# Patient Record
Sex: Female | Born: 1940 | Race: White | Hispanic: No | Marital: Married | Smoking: Former smoker
Health system: Southern US, Community
[De-identification: ages and names within clinical notes are randomized; demographics above are authoritative.]

## PROBLEM LIST (undated history)

## (undated) DIAGNOSIS — C7951 Secondary malignant neoplasm of bone: Secondary | ICD-10-CM

## (undated) DIAGNOSIS — K219 Gastro-esophageal reflux disease without esophagitis: Secondary | ICD-10-CM

## (undated) DIAGNOSIS — F419 Anxiety disorder, unspecified: Secondary | ICD-10-CM

## (undated) DIAGNOSIS — E785 Hyperlipidemia, unspecified: Secondary | ICD-10-CM

## (undated) DIAGNOSIS — K5790 Diverticulosis of intestine, part unspecified, without perforation or abscess without bleeding: Secondary | ICD-10-CM

## (undated) DIAGNOSIS — K589 Irritable bowel syndrome without diarrhea: Secondary | ICD-10-CM

## (undated) DIAGNOSIS — M4800 Spinal stenosis, site unspecified: Secondary | ICD-10-CM

## (undated) DIAGNOSIS — I10 Essential (primary) hypertension: Secondary | ICD-10-CM

## (undated) DIAGNOSIS — Z923 Personal history of irradiation: Secondary | ICD-10-CM

## (undated) DIAGNOSIS — N6009 Solitary cyst of unspecified breast: Secondary | ICD-10-CM

## (undated) DIAGNOSIS — A0472 Enterocolitis due to Clostridium difficile, not specified as recurrent: Secondary | ICD-10-CM

## (undated) DIAGNOSIS — D126 Benign neoplasm of colon, unspecified: Secondary | ICD-10-CM

## (undated) DIAGNOSIS — K449 Diaphragmatic hernia without obstruction or gangrene: Secondary | ICD-10-CM

## (undated) DIAGNOSIS — C3490 Malignant neoplasm of unspecified part of unspecified bronchus or lung: Secondary | ICD-10-CM

## (undated) HISTORY — DX: Essential (primary) hypertension: I10

## (undated) HISTORY — DX: Benign neoplasm of colon, unspecified: D12.6

## (undated) HISTORY — DX: Irritable bowel syndrome, unspecified: K58.9

## (undated) HISTORY — DX: Diaphragmatic hernia without obstruction or gangrene: K44.9

## (undated) HISTORY — DX: Enterocolitis due to Clostridium difficile, not specified as recurrent: A04.72

## (undated) HISTORY — DX: Solitary cyst of unspecified breast: N60.09

## (undated) HISTORY — DX: Spinal stenosis, site unspecified: M48.00

## (undated) HISTORY — DX: Gastro-esophageal reflux disease without esophagitis: K21.9

## (undated) HISTORY — PX: VAGINAL HYSTERECTOMY: SUR661

## (undated) HISTORY — PX: BLADDER REPAIR: SHX76

## (undated) HISTORY — PX: KNEE ARTHROSCOPY: SHX127

## (undated) HISTORY — DX: Diverticulosis of intestine, part unspecified, without perforation or abscess without bleeding: K57.90

## (undated) HISTORY — DX: Anxiety disorder, unspecified: F41.9

## (undated) HISTORY — PX: TOTAL KNEE ARTHROPLASTY: SHX125

## (undated) HISTORY — PX: CHOLECYSTECTOMY: SHX55

## (undated) HISTORY — PX: BREAST BIOPSY: SHX20

## (undated) HISTORY — DX: Hyperlipidemia, unspecified: E78.5

---

## 1997-11-21 ENCOUNTER — Ambulatory Visit (HOSPITAL_BASED_OUTPATIENT_CLINIC_OR_DEPARTMENT_OTHER): Admission: RE | Admit: 1997-11-21 | Discharge: 1997-11-21 | Payer: Self-pay | Admitting: General Surgery

## 1997-11-21 ENCOUNTER — Ambulatory Visit (HOSPITAL_COMMUNITY): Admission: RE | Admit: 1997-11-21 | Discharge: 1997-11-21 | Payer: Self-pay | Admitting: General Surgery

## 1998-09-27 ENCOUNTER — Emergency Department (HOSPITAL_COMMUNITY): Admission: EM | Admit: 1998-09-27 | Discharge: 1998-09-27 | Payer: Self-pay | Admitting: Emergency Medicine

## 1998-10-09 ENCOUNTER — Ambulatory Visit (HOSPITAL_COMMUNITY): Admission: RE | Admit: 1998-10-09 | Discharge: 1998-10-09 | Payer: Self-pay | Admitting: Neurosurgery

## 1999-05-20 HISTORY — PX: LUMBAR SPINE SURGERY: SHX701

## 1999-05-27 ENCOUNTER — Encounter: Payer: Self-pay | Admitting: Gastroenterology

## 1999-06-14 ENCOUNTER — Encounter (INDEPENDENT_AMBULATORY_CARE_PROVIDER_SITE_OTHER): Payer: Self-pay | Admitting: *Deleted

## 1999-06-14 ENCOUNTER — Ambulatory Visit (HOSPITAL_COMMUNITY): Admission: RE | Admit: 1999-06-14 | Discharge: 1999-06-15 | Payer: Self-pay | Admitting: General Surgery

## 1999-06-14 ENCOUNTER — Encounter: Payer: Self-pay | Admitting: General Surgery

## 1999-07-15 ENCOUNTER — Inpatient Hospital Stay (HOSPITAL_COMMUNITY): Admission: RE | Admit: 1999-07-15 | Discharge: 1999-07-19 | Payer: Self-pay | Admitting: Neurosurgery

## 1999-08-14 ENCOUNTER — Encounter: Admission: RE | Admit: 1999-08-14 | Discharge: 1999-08-14 | Payer: Self-pay | Admitting: Neurosurgery

## 1999-11-05 ENCOUNTER — Encounter: Admission: RE | Admit: 1999-11-05 | Discharge: 1999-11-05 | Payer: Self-pay | Admitting: Neurosurgery

## 1999-11-25 ENCOUNTER — Encounter: Admission: RE | Admit: 1999-11-25 | Discharge: 1999-11-25 | Payer: Self-pay | Admitting: *Deleted

## 1999-11-25 ENCOUNTER — Encounter: Payer: Self-pay | Admitting: *Deleted

## 2000-02-04 ENCOUNTER — Encounter: Admission: RE | Admit: 2000-02-04 | Discharge: 2000-02-04 | Payer: Self-pay | Admitting: Neurosurgery

## 2000-05-05 ENCOUNTER — Inpatient Hospital Stay (HOSPITAL_COMMUNITY): Admission: RE | Admit: 2000-05-05 | Discharge: 2000-05-08 | Payer: Self-pay | Admitting: Urology

## 2000-06-09 ENCOUNTER — Encounter: Admission: RE | Admit: 2000-06-09 | Discharge: 2000-06-09 | Payer: Self-pay | Admitting: Neurosurgery

## 2000-07-20 ENCOUNTER — Emergency Department (HOSPITAL_COMMUNITY): Admission: EM | Admit: 2000-07-20 | Discharge: 2000-07-20 | Payer: Self-pay | Admitting: Emergency Medicine

## 2000-07-21 ENCOUNTER — Encounter: Payer: Self-pay | Admitting: Emergency Medicine

## 2000-12-15 ENCOUNTER — Encounter: Admission: RE | Admit: 2000-12-15 | Discharge: 2000-12-15 | Payer: Self-pay | Admitting: Neurosurgery

## 2001-02-02 ENCOUNTER — Ambulatory Visit (HOSPITAL_COMMUNITY): Admission: RE | Admit: 2001-02-02 | Discharge: 2001-02-02 | Payer: Self-pay | Admitting: Gastroenterology

## 2001-02-02 ENCOUNTER — Encounter: Payer: Self-pay | Admitting: Gastroenterology

## 2001-02-04 ENCOUNTER — Encounter: Payer: Self-pay | Admitting: Orthopedic Surgery

## 2001-02-08 ENCOUNTER — Inpatient Hospital Stay (HOSPITAL_COMMUNITY): Admission: RE | Admit: 2001-02-08 | Discharge: 2001-02-11 | Payer: Self-pay | Admitting: Orthopedic Surgery

## 2001-02-11 ENCOUNTER — Inpatient Hospital Stay (HOSPITAL_COMMUNITY)
Admission: RE | Admit: 2001-02-11 | Discharge: 2001-02-20 | Payer: Self-pay | Admitting: Physical Medicine & Rehabilitation

## 2001-02-19 ENCOUNTER — Encounter: Payer: Self-pay | Admitting: Physical Medicine & Rehabilitation

## 2001-07-13 ENCOUNTER — Ambulatory Visit (HOSPITAL_COMMUNITY): Admission: RE | Admit: 2001-07-13 | Discharge: 2001-07-13 | Payer: Self-pay | Admitting: Internal Medicine

## 2001-07-13 ENCOUNTER — Encounter: Payer: Self-pay | Admitting: Internal Medicine

## 2001-10-26 ENCOUNTER — Encounter: Payer: Self-pay | Admitting: Gastroenterology

## 2001-11-02 ENCOUNTER — Encounter: Payer: Self-pay | Admitting: Gastroenterology

## 2002-02-22 ENCOUNTER — Ambulatory Visit (HOSPITAL_COMMUNITY): Admission: RE | Admit: 2002-02-22 | Discharge: 2002-02-22 | Payer: Self-pay | Admitting: Neurosurgery

## 2002-03-02 ENCOUNTER — Encounter: Admission: RE | Admit: 2002-03-02 | Discharge: 2002-04-21 | Payer: Self-pay | Admitting: Neurosurgery

## 2002-07-20 ENCOUNTER — Encounter: Admission: RE | Admit: 2002-07-20 | Discharge: 2002-07-20 | Payer: Self-pay | Admitting: Neurosurgery

## 2002-08-01 ENCOUNTER — Encounter: Admission: RE | Admit: 2002-08-01 | Discharge: 2002-08-01 | Payer: Self-pay | Admitting: Neurosurgery

## 2002-12-14 ENCOUNTER — Ambulatory Visit (HOSPITAL_COMMUNITY): Admission: RE | Admit: 2002-12-14 | Discharge: 2002-12-14 | Payer: Self-pay | Admitting: Neurosurgery

## 2003-01-04 ENCOUNTER — Encounter: Admission: RE | Admit: 2003-01-04 | Discharge: 2003-01-04 | Payer: Self-pay | Admitting: Neurosurgery

## 2003-01-16 ENCOUNTER — Encounter: Payer: Self-pay | Admitting: Gastroenterology

## 2003-01-16 ENCOUNTER — Ambulatory Visit (HOSPITAL_COMMUNITY): Admission: RE | Admit: 2003-01-16 | Discharge: 2003-01-16 | Payer: Self-pay | Admitting: Gastroenterology

## 2003-01-31 ENCOUNTER — Encounter: Admission: RE | Admit: 2003-01-31 | Discharge: 2003-01-31 | Payer: Self-pay | Admitting: Neurosurgery

## 2003-05-24 ENCOUNTER — Encounter: Admission: RE | Admit: 2003-05-24 | Discharge: 2003-05-24 | Payer: Self-pay | Admitting: Neurosurgery

## 2003-09-08 ENCOUNTER — Encounter: Admission: RE | Admit: 2003-09-08 | Discharge: 2003-09-08 | Payer: Self-pay | Admitting: Neurosurgery

## 2004-05-19 DIAGNOSIS — A0472 Enterocolitis due to Clostridium difficile, not specified as recurrent: Secondary | ICD-10-CM

## 2004-05-19 HISTORY — DX: Enterocolitis due to Clostridium difficile, not specified as recurrent: A04.72

## 2004-06-13 ENCOUNTER — Ambulatory Visit: Payer: Self-pay | Admitting: Gastroenterology

## 2004-07-12 ENCOUNTER — Ambulatory Visit: Payer: Self-pay | Admitting: Gastroenterology

## 2004-11-11 ENCOUNTER — Inpatient Hospital Stay: Payer: Self-pay | Admitting: Internal Medicine

## 2004-11-11 ENCOUNTER — Other Ambulatory Visit: Payer: Self-pay

## 2005-07-29 ENCOUNTER — Ambulatory Visit: Payer: Self-pay | Admitting: Gastroenterology

## 2005-08-19 ENCOUNTER — Encounter: Payer: Self-pay | Admitting: Gastroenterology

## 2005-08-19 ENCOUNTER — Ambulatory Visit (HOSPITAL_COMMUNITY): Admission: RE | Admit: 2005-08-19 | Discharge: 2005-08-19 | Payer: Self-pay | Admitting: Gastroenterology

## 2005-10-02 ENCOUNTER — Encounter: Payer: Self-pay | Admitting: Gastroenterology

## 2005-11-10 ENCOUNTER — Encounter: Payer: Self-pay | Admitting: Gastroenterology

## 2006-03-25 ENCOUNTER — Ambulatory Visit: Payer: Self-pay

## 2006-03-27 ENCOUNTER — Ambulatory Visit: Payer: Self-pay

## 2006-06-19 ENCOUNTER — Emergency Department (HOSPITAL_COMMUNITY): Admission: EM | Admit: 2006-06-19 | Discharge: 2006-06-19 | Payer: Self-pay | Admitting: Emergency Medicine

## 2007-05-20 HISTORY — PX: PARATHYROIDECTOMY: SHX19

## 2007-08-19 ENCOUNTER — Encounter: Admission: RE | Admit: 2007-08-19 | Discharge: 2007-08-19 | Payer: Self-pay | Admitting: Internal Medicine

## 2008-03-03 ENCOUNTER — Telehealth: Payer: Self-pay | Admitting: Gastroenterology

## 2008-09-03 ENCOUNTER — Inpatient Hospital Stay (HOSPITAL_COMMUNITY): Admission: EM | Admit: 2008-09-03 | Discharge: 2008-09-05 | Payer: Self-pay | Admitting: Emergency Medicine

## 2008-09-05 ENCOUNTER — Encounter (INDEPENDENT_AMBULATORY_CARE_PROVIDER_SITE_OTHER): Payer: Self-pay | Admitting: *Deleted

## 2008-10-10 ENCOUNTER — Ambulatory Visit: Payer: Self-pay | Admitting: Gastroenterology

## 2008-10-10 DIAGNOSIS — R079 Chest pain, unspecified: Secondary | ICD-10-CM

## 2008-10-10 DIAGNOSIS — R1013 Epigastric pain: Secondary | ICD-10-CM | POA: Insufficient documentation

## 2008-10-10 DIAGNOSIS — R740 Nonspecific elevation of levels of transaminase and lactic acid dehydrogenase [LDH]: Secondary | ICD-10-CM

## 2008-10-10 DIAGNOSIS — K219 Gastro-esophageal reflux disease without esophagitis: Secondary | ICD-10-CM | POA: Insufficient documentation

## 2008-10-11 ENCOUNTER — Encounter: Payer: Self-pay | Admitting: Gastroenterology

## 2008-10-11 LAB — CONVERTED CEMR LAB
Albumin: 4.1 g/dL (ref 3.5–5.2)
Alkaline Phosphatase: 126 units/L — ABNORMAL HIGH (ref 39–117)
Total Protein: 7.6 g/dL (ref 6.0–8.3)

## 2008-12-12 ENCOUNTER — Ambulatory Visit: Payer: Self-pay | Admitting: Gastroenterology

## 2009-05-31 ENCOUNTER — Encounter (INDEPENDENT_AMBULATORY_CARE_PROVIDER_SITE_OTHER): Payer: Self-pay | Admitting: *Deleted

## 2009-06-19 ENCOUNTER — Encounter (INDEPENDENT_AMBULATORY_CARE_PROVIDER_SITE_OTHER): Payer: Self-pay

## 2009-06-19 DIAGNOSIS — D126 Benign neoplasm of colon, unspecified: Secondary | ICD-10-CM

## 2009-06-19 HISTORY — DX: Benign neoplasm of colon, unspecified: D12.6

## 2009-06-20 ENCOUNTER — Ambulatory Visit: Payer: Self-pay | Admitting: Gastroenterology

## 2009-07-06 ENCOUNTER — Ambulatory Visit: Payer: Self-pay | Admitting: Gastroenterology

## 2009-07-11 ENCOUNTER — Encounter: Payer: Self-pay | Admitting: Gastroenterology

## 2009-11-22 ENCOUNTER — Encounter: Payer: Self-pay | Admitting: Gastroenterology

## 2009-12-25 ENCOUNTER — Telehealth: Payer: Self-pay | Admitting: Gastroenterology

## 2010-01-10 ENCOUNTER — Ambulatory Visit: Payer: Self-pay | Admitting: Gastroenterology

## 2010-01-10 DIAGNOSIS — K589 Irritable bowel syndrome without diarrhea: Secondary | ICD-10-CM

## 2010-01-10 DIAGNOSIS — Z860100 Personal history of colon polyps, unspecified: Secondary | ICD-10-CM | POA: Insufficient documentation

## 2010-01-31 ENCOUNTER — Inpatient Hospital Stay (HOSPITAL_COMMUNITY)
Admission: RE | Admit: 2010-01-31 | Discharge: 2010-02-03 | Payer: Self-pay | Source: Home / Self Care | Admitting: Orthopedic Surgery

## 2010-03-07 ENCOUNTER — Encounter: Payer: Self-pay | Admitting: Gastroenterology

## 2010-06-18 NOTE — Medication Information (Signed)
Summary: Approved/medco  Approved/medco   Imported By: Lester Oildale 11/27/2009 08:38:24  _____________________________________________________________________  External Attachment:    Type:   Image     Comment:   External Document

## 2010-06-18 NOTE — Letter (Signed)
Summary: [facility] Instructions  [facility]  [address] [place], [place] [address]   Phone: [phone]  Fax: [phone]       SIBBIE FLAMMIA    [birth year]    MRN: [id number]        Procedure Day /Date:  Friday 02/18      Arrival Time:   12:30pm     Procedure Time:  1:30pm     Location of Procedure:                    _ X_  [facility] (4th Floor)                        PREPARATION FOR COLONOSCOPY WITH MOVIPREP   Starting 5 days prior to your procedure   Sunday 02/13  do not eat nuts, seeds, popcorn, corn, beans, peas,  salads, or any raw vegetables.  Do not take any fiber supplements (e.g. Metamucil, Citrucel, and Benefiber).  THE DAY BEFORE YOUR PROCEDURE         DATE:   02/17   DAY: Thursday  1.  Drink clear liquids the entire day-NO SOLID FOOD  2.  Do not drink anything colored red or purple.  Avoid juices with pulp.  No orange juice.  3.  Drink at least 64 oz. (8 glasses) of fluid/clear liquids during the day to prevent dehydration and help the prep work efficiently.  CLEAR LIQUIDS INCLUDE: Water Jello Ice Popsicles Tea (sugar ok, no milk/cream) Powdered fruit flavored drinks Coffee (sugar ok, no milk/cream) Gatorade Juice: apple, white grape, white cranberry  Lemonade Clear bullion, consomm, broth Carbonated beverages (any kind) Strained chicken noodle soup Hard Candy                             4.  In the morning, mix first dose of MoviPrep solution:    Empty 1 Pouch A and 1 Pouch B into the disposable container    Add lukewarm drinking water to the top line of the container. Mix to dissolve    Refrigerate (mixed solution should be used within 24 hrs)  5.  Begin drinking the prep at 5:00 p.m. The MoviPrep container is divided by 4 marks.   Every 15 minutes drink the solution down to the next mark (approximately 8 oz) until the full liter is complete.   6.  Follow completed prep with 16 oz of clear liquid of your choice  (Nothing red or purple).  Continue to drink clear liquids until bedtime.  7.  Before going to bed, mix second dose of MoviPrep solution:    Empty 1 Pouch A and 1 Pouch B into the disposable container    Add lukewarm drinking water to the top line of the container. Mix to dissolve    Refrigerate  THE DAY OF YOUR PROCEDURE      DATE:  02/18  DAY: Friday  Beginning at  8:30 a.m. (5 hours before procedure):         1. Every 15 minutes, drink the solution down to the next mark (approx 8 oz) until the full liter is complete.  2. Follow completed prep with 16 oz. of clear liquid of your choice.    3. You may drink clear liquids until  11:30am  (2 HOURS BEFORE PROCEDURE).   MEDICATION INSTRUCTIONS  Unless otherwise instructed, you should take regular prescription medications with  a small sip of water   as early as possible the morning of your procedure.         OTHER INSTRUCTIONS  You will need a responsible adult at least 69 years of age to accompany you and drive you home.   This person must remain in the waiting room during your procedure.  Wear loose fitting clothing that is easily removed.  Leave jewelry and other valuables at home.  However, you may wish to bring a book to read or  an iPod/MP3 player to listen to music as you wait for your procedure to start.  Remove all body piercing jewelry and leave at home.  Total time from sign-in until discharge is approximately 2-3 hours.  You should go home directly after your procedure and rest.  You can resume normal activities the  day after your procedure.  The day of your procedure you should not:   Drive   Make legal decisions   Operate machinery   Drink alcohol   Return to work  You will receive specific instructions about eating, activities and medications before you leave.    The above instructions have been reviewed and explained to me by  Ulis Rias RN  June 20, 2009 10:15 AM .   I fully  understand and can verbalize these instructions _____________________________ Date _________

## 2010-06-18 NOTE — Miscellaneous (Signed)
Summary: Lec previsit  Clinical Lists Changes  Medications: Added new medication of MOVIPREP 100 GM  SOLR (PEG-KCL-NACL-NASULF-NA ASC-C) As per prep instructions. - Signed Rx of MOVIPREP 100 GM  SOLR (PEG-KCL-NACL-NASULF-NA ASC-C) As per prep instructions.;  #1 x 0;  Signed;  Entered by: Ulis Rias RN;  Authorized by: Meryl Dare MD [facility];  Method used: [facility] [phone]*, [address], [place], [place], [place]  [address], Ph: [phone] or [phone], Fax: [phone]    Prescriptions: MOVIPREP 100 GM  SOLR (PEG-KCL-NACL-NASULF-NA ASC-C) As per prep instructions.  #1 x 0   Entered by:   Ulis Rias RN   Authorized by:   Meryl Dare MD [facility]   Signed by:   Ulis Rias RN on 06/20/2009   Method used:   Electronically to        [facility] [phone]* (retail)       [address] [place], [place]  [address]       Ph: [phone] or [phone]       Fax: [phone]   RxID:   [id number]

## 2010-06-18 NOTE — Assessment & Plan Note (Signed)
Summary: f/u GERD, med refills   History of Present Illness Visit Type: Follow-up Visit Primary GI MD: Elie Goody MD [facility] Primary Provider: Ralene Ok, MD Requesting Provider: n/a Chief Complaint: Patient here for yearly follow up for her gerd and get medication refills of Dexilant and Glycopyrrolate. She does have some changes in her bowels but relates them to her IBS, her BMs change from diarrhea to constipation but she feels this is normal for her. History of Present Illness:   [name] returns with good control of her reflux symptoms and irritable bowel syndrome on her current medications. She states she has occasional variations and her stool pattern between slightly loose to mildly constipated. She underwent colonoscopy earlier this year.   GI Review of Systems      Denies abdominal pain, acid reflux, belching, bloating, chest pain, dysphagia with liquids, dysphagia with solids, heartburn, loss of appetite, nausea, vomiting, vomiting blood, weight loss, and  weight gain.      Reports change in bowel habits, constipation, diarrhea, and  irritable bowel syndrome.     Denies anal fissure, black tarry stools, diverticulosis, fecal incontinence, heme positive stool, hemorrhoids, jaundice, light color stool, liver problems, rectal bleeding, and  rectal pain. Current Medications (verified): 1)  Budeprion Sr 150 Mg Xr12h-Tab (Bupropion Hcl) .... One Tablet By Mouth Once Daily 2)  Evista 60 Mg Tabs (Raloxifene Hcl) .... One Tablet By Mouth Once Daily 3)  Lipitor 80 Mg Tabs (Atorvastatin Calcium) .... One Tablet By Mouth Once Daily 4)  Metoprolol Tartrate 50 Mg Tabs (Metoprolol Tartrate) .... One Tablet By Mouth Once Daily 5)  Tribenzor 40-10-12.5 Mg Tabs (Olmesartan-Amlodipine-Hctz) .... Take One By Mouth Once Daily 6)  Glycopyrrolate 2 Mg Tabs (Glycopyrrolate) .... One Tablet By Mouth Two Times A Day 7)  Dexilant 60 Mg Cpdr (Dexlansoprazole) .... One Tablet By Mouth Once Daily 8)   Aleve 220 Mg Tabs (Naproxen Sodium) .... Talke 4-6 Per Day  Allergies (verified): 1)  ! Keflex 2)  ! Lodine 3)  ! Ace Inhibitors  Past History:  Past Medical History: GERD Irritable Bowel Syndrome Adenomatous colon polyps, 06/2009 Hiatal hernia Benign breast cysts Depression Ureteral lithiasis Diverticulosis Hemorrhoids Hyperlipidemia Hypertension Hyperparathyroidism Anxiety Disorder Migraine headaches Osteoporosis with Vitamin D deficiency  Spinal stenosis C diff colitis 2006  Past Surgical History: Reviewed history from 10/10/2008 and no changes required. Cholecystectomy Hysterectomy Bilateral knee arthroscopies Par[facility]roidectomy at [facility] 2009 Lumbar surgery 2001 Breast biopsy for fibrocystic disease-bilateral Left knee replacement Bladder Tack x 2   Family History: Reviewed history from 10/10/2008 and no changes required. Family History of Colon Polyps: Sister  Family History of Irritable Bowel Syndrome:sister Family History of Breast Cancer: Sister No FH of Colon Cancer: Family History of Diabetes: Sister x 3, Brother  Social History: Reviewed history from 10/10/2008 and no changes required. Alcohol Use - no Patient is a former smoker. -Stopped 2 years ago Daily Caffeine Use-2 cups daily Illicit Drug Use - no Patient gets regular exercise.  Review of Systems       The patient complains of arthritis/joint pain.         The pertinent positives and negatives are noted as above and in the HPI. All other ROS were reviewed and were negative.   Vital Signs:  Patient profile:   70 year old female Height:      65.5 inches Weight:      150.6 pounds BMI:     24.77 Pulse rate:   74 / minute  Pulse rhythm:   regular BP sitting:   100 / 62  (left arm) Cuff size:   regular  Vitals Entered By: Harlow Mares CMA Duncan Dull) (January 10, 2010 10:40 AM)  Physical Exam  General:  Well developed, well nourished, no acute distress. Head:  Normocephalic and  atraumatic. Eyes:  PERRLA, no icterus. Mouth:  No deformity or lesions, dentition normal. Lungs:  Clear throughout to auscultation. Heart:  Regular rate and rhythm; no murmurs, rubs,  or bruits. Abdomen:  Soft, nontender and nondistended. No masses, hepatosplenomegaly or hernias noted. Normal bowel sounds. Psych:  Alert and cooperative. Normal mood and affect.  Impression & Recommendations:  Problem # 1:  GERD (ICD-530.81) Continue Dexilant 60 mg qam, along with standard antireflux measures.  Problem # 2:  IRRITABLE BOWEL SYNDROME (ICD-564.1) Continue glycopyrrolate 2 mg twice daily, along with a high-fiber diet, and adequate fluid intake.  Problem # 3:  PERSONAL HX COLONIC POLYPS (ICD-V12.72) Surveillance colonoscopy recommended February 2016.  Patient Instructions: 1)  Pick up your prescriptions from your pharmacy.  2)  Please schedule a follow-up appointment in 1 year. 3)  Please continue current medications.  4)  Copy sent to : Ralene Ok, MD 5)  The medication list was reviewed and reconciled.  All changed / newly prescribed medications were explained.  A complete medication list was provided to the patient / caregiver.  Prescriptions: GLYCOPYRROLATE 2 MG TABS (GLYCOPYRROLATE) one tablet by mouth two times a day  #60 x 11   Entered by:   Christie Nottingham CMA (AAMA)   Authorized by:   Meryl Dare MD [facility]   Signed by:   Meryl Dare MD [facility] on 01/10/2010   Method used:   [facility]ronically to        [facility] [phone]* (retail)       [address] [place], [place]  [address]       Ph: [phone] or [phone]       Fax: [phone]   RxID:   [id number] DEXILANT 60 MG CPDR (DEXLANSOPRAZOLE) one tablet by mouth once daily  #30 x 11   Entered by:   Christie Nottingham CMA (AAMA)   Authorized by:   Meryl Dare MD [facility]   Signed by:   Meryl Dare MD [facility] on 01/10/2010   Method used:   Electronically to        [facility] [phone]*  (retail)       [address] [place], [place]  [address]       Ph: [phone] or [phone]       Fax: [phone]   RxID:   [id number]

## 2010-06-18 NOTE — Letter (Signed)
Summary: Patient Notice- Polyp Results  [facility]  [address] [place], [place] [address]   Phone: [phone]  Fax: [phone]        July 11, 2009 MRN: [id number]    [facility] [address] [place], [place]  [address]    Dear Ms. Creppel,  I am pleased to inform you that the colon polyp(s) removed during your recent colonoscopy was (were) found to be benign (no cancer detected) upon pathologic examination.  I recommend you have a repeat colonoscopy examination in 5 years to look for recurrent polyps, as having colon polyps increases your risk for having recurrent polyps or even colon cancer in the future.  Should you develop new or worsening symptoms of abdominal pain, bowel habit changes or bleeding from the rectum or bowels, please schedule an evaluation with either your primary care physician or with me.  Continue treatment plan as outlined the day of your exam.  Please call us if you are having persistent problems or have questions about your condition that have not been fully answered at this time.  Sincerely,  Meryl Dare MD [facility]  This letter has been electronically signed by your physician.  Appended Document: Patient Notice- Polyp Results  Letter mailed 2.24.11

## 2010-06-18 NOTE — Letter (Signed)
Summary: Colonoscopy Letter  [facility]  [address] [place], [place] [address]   Phone: [phone]  Fax: [phone]      May 31, 2009 MRN: [id number]   [facility] [address] [place], [place]  [address]   Dear Ms. Mannina,   According to your medical record, it is time for you to schedule a Colonoscopy. The American Cancer Society recommends this procedure as a method to detect early colon cancer. Patients with a family history of colon cancer, or a personal history of colon polyps or inflammatory bowel disease are at increased risk.  This letter has beeen generated based on the recommendations made at the time of your procedure. If you feel that in your particular situation this may no longer apply, please contact our office.  Please call our office at [phone] to schedule this appointment or to update your records at your earliest convenience.  Thank you for cooperating with [place] to provide you with the very best care possible.   Sincerely,  Judie Petit T. Russella Dar, M.D.  [facility]astro[facility]ology [facility] [phone]

## 2010-06-18 NOTE — Letter (Signed)
Summary: Ralene Ok MD  Ralene Ok MD   Imported By: Sherian Rein 03/14/2010 10:49:59  _____________________________________________________________________  External Attachment:    Type:   Image     Comment:   External Document

## 2010-06-18 NOTE — Procedures (Signed)
Summary: Colonoscopy  Patient: [name] Note: All result statuses are Final unless otherwise noted.  Tests: (1) Colonoscopy (COL)   COL Colonoscopy           DONE     [facility]     [address]     [place], [place]  [address]           COLONOSCOPY PROCEDURE REPORT           PATIENT:  [name], [name]  MR#:  [id number]     BIRTHDATE:  1940/07/18, 68 yrs. old  GENDER:  female           ENDOSCOPIST:  Judie Petit T. Russella Dar, MD, [facility]           PROCEDURE DATE:  07/06/2009     PROCEDURE:  Colonoscopy with biopsy     ASA CLASS:  Class II     INDICATIONS:  1) family Hx of polyps           MEDICATIONS:   Fentanyl 75 mcg IV, Versed 9 mg IV           DESCRIPTION OF PROCEDURE:   After the risks benefits and     alternatives of the procedure were thoroughly explained, informed     consent was obtained.  Digital rectal exam was performed and     revealed no abnormalities.   The LB [id number]-[id number] endoscope     was introduced through the anus and advanced to the cecum, which     was identified by both the appendix and ileocecal valve, without     limitations.  The quality of the prep was good, using MoviPrep.     The instrument was then slowly withdrawn as the colon was fully     examined.     <<PROCEDUREIMAGES>>           FINDINGS:  A sessile polyp was found in the distal transverse     colon. It was 3 mm in size. The polyp was removed using cold     biopsy forceps.  Moderate diverticulosis was found in the sigmoid     colon.  This was otherwise a normal examination of the colon.     Retroflexed views in the rectum revealed no abnormalities.   The     time to cecum =  3.75  minutes. The scope was then withdrawn (time     =  12  min) from the patient and the procedure completed.           COMPLICATIONS:  None           ENDOSCOPIC IMPRESSION:     1) 3 mm sessile polyp in the distal transverse colon     2) Moderate diverticulosis in the sigmoid colon            RECOMMENDATIONS:     1) Await pathology results     2) High fiber diet with liberal fluid intake.     3) Repeat Colonoscopy in 5 years.           Venita Lick. Russella Dar, MD, [facility]           CC: Ralene Ok MD           n.     Rosalie DoctorVenita Lick. Khalani Novoa at 07/06/2009 02:15 PM           Elita Quick, [phone]  Note: An exclamation mark (!) indicates a result that was not  dispersed into the flowsheet. Document Creation Date: 07/06/2009 2:16 PM _______________________________________________________________________  (1) Order result status: Final Collection or observation date-time: 07/06/2009 14:11 Requested date-time:  Receipt date-time:  Reported date-time:  Referring Physician:   Ordering Physician: Claudette Head [phone]) Specimen Source:  Source: Launa Grill Order Number: [id number] Lab site:   Appended Document: Colonoscopy recall 5 yrs/2016/aw     Procedures Next Due Date:    Colonoscopy: 06/2014

## 2010-06-18 NOTE — Progress Notes (Signed)
Summary: rx concern  Medications Added DEXILANT 60 MG CPDR (DEXLANSOPRAZOLE) one tablet by mouth once daily       Phone Note Call from Patient Call back at [facility] Phone [phone]   Caller: Patient Call For: Dr. Russella Dar Reason for Call: Talk to Nurse Summary of Call: having trouble refilling Dexilant and doesnt know why... pharmacy would not refill[facility] [place] Initial call taken by: Vallarie Mare,  December 25, 2009 1:37 PM  Follow-up for Phone Call        Pt needs a yearly follow-up visit for GERD and medication refills. Pt scheduled for 01/10/10 at 10:15am. Pt verbalized understanding to keep appt for any further refills. Rx was sent to pts pharmacy.  Follow-up by: Christie Nottingham CMA Duncan Dull),  December 25, 2009 2:11 PM    New/Updated Medications: DEXILANT 60 MG CPDR (DEXLANSOPRAZOLE) one tablet by mouth once daily Prescriptions: DEXILANT 60 MG CPDR (DEXLANSOPRAZOLE) one tablet by mouth once daily  #30 x 0   Entered by:   Christie Nottingham CMA (AAMA)   Authorized by:   Meryl Dare MD [facility]   Signed by:   Christie Nottingham CMA Duncan Dull) on 12/25/2009   Method used:   [facility]ron[facility] to        [facility] [phone]* (retail)       [address] [place], [place]  [address]       Ph: [phone] or [phone]       Fax: [phone]   RxID:   [id number]

## 2010-08-01 LAB — DIFFERENTIAL
Basophils Relative: 0 % (ref 0–1)
Eosinophils Absolute: 0.2 10*3/uL (ref 0.0–0.7)
Eosinophils Relative: 2 % (ref 0–5)
Lymphs Abs: 2.1 10*3/uL (ref 0.7–4.0)
Monocytes Absolute: 0.6 10*3/uL (ref 0.1–1.0)
Monocytes Relative: 6 % (ref 3–12)
Neutrophils Relative %: 68 % (ref 43–77)

## 2010-08-01 LAB — SURGICAL PCR SCREEN
MRSA, PCR: NEGATIVE
Staphylococcus aureus: NEGATIVE

## 2010-08-01 LAB — COMPREHENSIVE METABOLIC PANEL
ALT: 33 U/L (ref 0–35)
AST: 27 U/L (ref 0–37)
Alkaline Phosphatase: 112 U/L (ref 39–117)
GFR calc Af Amer: 49 mL/min — ABNORMAL LOW (ref >60)
Glucose, Bld: 89 mg/dL (ref 70–99)
Potassium: 4.3 mEq/L (ref 3.5–5.1)
Sodium: 137 mEq/L (ref 135–145)
Total Protein: 7.3 g/dL (ref 6.0–8.3)

## 2010-08-01 LAB — CBC
HCT: 31.6 % — ABNORMAL LOW (ref 36.0–46.0)
HCT: 35.8 % — ABNORMAL LOW (ref 36.0–46.0)
Hemoglobin: 12.3 g/dL (ref 12.0–15.0)
MCH: 31 pg (ref 26.0–34.0)
MCH: 31.4 pg (ref 26.0–34.0)
MCHC: 34.8 g/dL (ref 30.0–36.0)
MCHC: 35.1 g/dL (ref 30.0–36.0)
MCV: 88.9 fL (ref 78.0–100.0)
Platelets: 175 10*3/uL (ref 150–400)
RBC: 2.89 MIL/uL — ABNORMAL LOW (ref 3.87–5.11)
RDW: 14.2 % (ref 11.5–15.5)
RDW: 14.6 % (ref 11.5–15.5)
RDW: 14.7 % (ref 11.5–15.5)
RDW: 14.7 % (ref 11.5–15.5)
WBC: 9.1 10*3/uL (ref 4.0–10.5)
WBC: 9 10*3/uL (ref 4.0–10.5)

## 2010-08-01 LAB — BASIC METABOLIC PANEL
BUN: 8 mg/dL (ref 6–23)
BUN: 9 mg/dL (ref 6–23)
Calcium: 8.4 mg/dL (ref 8.4–10.5)
Chloride: 99 mEq/L (ref 96–112)
Creatinine, Ser: 0.86 mg/dL (ref 0.4–1.2)
GFR calc non Af Amer: 55 mL/min — ABNORMAL LOW (ref >60)
GFR calc non Af Amer: >60 mL/min (ref >60)
Glucose, Bld: 109 mg/dL — ABNORMAL HIGH (ref 70–99)
Glucose, Bld: 135 mg/dL — ABNORMAL HIGH (ref 70–99)
Potassium: 4.4 mEq/L (ref 3.5–5.1)

## 2010-08-01 LAB — PROTIME-INR: INR: 1.08 (ref 0.00–1.49)

## 2010-08-01 LAB — URINALYSIS, ROUTINE W REFLEX MICROSCOPIC
Bilirubin Urine: NEGATIVE
Glucose, UA: NEGATIVE mg/dL
Hgb urine dipstick: NEGATIVE
Specific Gravity, Urine: 1.008 (ref 1.005–1.030)
pH: 6 (ref 5.0–8.0)

## 2010-08-01 LAB — CROSSMATCH

## 2010-08-28 LAB — URINALYSIS, ROUTINE W REFLEX MICROSCOPIC
Bilirubin Urine: NEGATIVE
Glucose, UA: NEGATIVE mg/dL
Ketones, ur: NEGATIVE mg/dL
Specific Gravity, Urine: 1.042 — ABNORMAL HIGH (ref 1.005–1.030)
pH: 7.5 (ref 5.0–8.0)

## 2010-08-28 LAB — COMPREHENSIVE METABOLIC PANEL
ALT: 56 U/L — ABNORMAL HIGH (ref 0–35)
AST: 44 U/L — ABNORMAL HIGH (ref 0–37)
Albumin: 3.8 g/dL (ref 3.5–5.2)
Alkaline Phosphatase: 122 U/L — ABNORMAL HIGH (ref 39–117)
BUN: 16 mg/dL (ref 6–23)
CO2: 33 mEq/L — ABNORMAL HIGH (ref 19–32)
Calcium: 8.3 mg/dL — ABNORMAL LOW (ref 8.4–10.5)
Calcium: 8.5 mg/dL (ref 8.4–10.5)
Chloride: 108 mEq/L (ref 96–112)
Creatinine, Ser: 0.73 mg/dL (ref 0.4–1.2)
GFR calc Af Amer: >60 mL/min (ref >60)
GFR calc non Af Amer: >60 mL/min (ref >60)
Glucose, Bld: 97 mg/dL (ref 70–99)
Potassium: 3.4 mEq/L — ABNORMAL LOW (ref 3.5–5.1)
Potassium: 3.5 mEq/L (ref 3.5–5.1)
Sodium: 145 mEq/L (ref 135–145)
Total Bilirubin: 0.4 mg/dL (ref 0.3–1.2)
Total Protein: 6.8 g/dL (ref 6.0–8.3)

## 2010-08-28 LAB — DIFFERENTIAL
Lymphocytes Relative: 12 % (ref 12–46)
Monocytes Absolute: 0.6 10*3/uL (ref 0.1–1.0)
Monocytes Relative: 6 % (ref 3–12)
Neutro Abs: 8.2 10*3/uL — ABNORMAL HIGH (ref 1.7–7.7)

## 2010-08-28 LAB — CBC
HCT: 40.7 % (ref 36.0–46.0)
MCHC: 34.1 g/dL (ref 30.0–36.0)
MCV: 86 fL (ref 78.0–100.0)
Platelets: 271 10*3/uL (ref 150–400)
RDW: 14.5 % (ref 11.5–15.5)

## 2010-08-28 LAB — D-DIMER, QUANTITATIVE: D-Dimer, Quant: 0.49 ug/mL-FEU — ABNORMAL HIGH (ref 0.00–0.48)

## 2010-08-28 LAB — POCT CARDIAC MARKERS
CKMB, poc: 1.2 ng/mL (ref 1.0–8.0)
Myoglobin, poc: 41.8 ng/mL (ref 12–200)

## 2010-08-28 LAB — URINE CULTURE

## 2010-08-28 LAB — TROPONIN I: Troponin I: 0.02 ng/mL (ref 0.00–0.06)

## 2010-08-28 LAB — CK TOTAL AND CKMB (NOT AT ARMC)
CK, MB: 1.5 ng/mL (ref 0.3–4.0)
CK, MB: 1.6 ng/mL (ref 0.3–4.0)
Relative Index: INVALID (ref 0.0–2.5)
Total CK: 86 U/L (ref 7–177)
Total CK: 99 U/L (ref 7–177)

## 2010-10-01 NOTE — Discharge Summary (Signed)
[name], [name]                  ACCOUNT NO.:  [id number]   MEDICAL RECORD NO.:  [id number]          PATIENT TYPE:  INP   LOCATION:  3703                         FACILITY:  [facility]   PHYSICIAN:  Mandy Gardener, MD    DATE OF BIRTH:  1941/01/24   DATE OF ADMISSION:  09/02/2008  DATE OF DISCHARGE:  09/05/2008                               DISCHARGE SUMMARY   DISCHARGE DIAGNOSES:  1. Abdominal pain most likely related to her underlying      gastroesophageal reflux disease versus mild gastritis.  2. Sclerotic lesion in the medial right iliac pon which was benign as      per bone marrow scan results.  3. Hypertension.  4. Hyperlipidemia.  5. Osteoporosis and osteopenia.  6. Depression.  7. History of shingles.  8. History of Clostridium difficile colitis in September 2002.  9. History of colonic diverticulosis.  10.Degenerative joint disease.  11.Irritable bowel syndrome.  12.History of hiatal hernia.   DISCHARGE MEDICATIONS:  1. Azor 10/40 mg p.o. once daily.  2. Evista 60 mg once a day.  3. Metoprolol tartrate 50 mg once a day.  4. Lipitor 80 mg once a day.  5. Prilosec 20 mg twice daily.  6. Budeprion SR 150 mg once a day.   CONSULTATIONS:  None.   PROCEDURES:  None.   DIAGNOSTIC STUDIES:  1. Chest x-ray on September 02, 2008 showed stable exam with no active      disease.  2. CT angiography of the chest on September 02, 2008 showed no evidence of      pulmonary embolism on or any other active disease and it showed      mild pulmonary emphysema.  3. CT scan of the abdomen with contrast on September 02, 2008 showed no      evidence of acute problem.  4. CT scan of the pelvis with contrast on September 02, 2008 showed 2.4 x      2.6 cm medial right iliac sclerotic lesion and there was no      evidence of acute abnormality and it also showed colonic      diverticulosis.  5. Bone scan on September 04, 2008 showed no evidence of metastatic      disease and showed no definite positivity in  the right iliac pon      that suggest iliac pon lesion seen on the CAT scan is benign.      Follow up with the primary doctor which is Dr. Ralene Ok within 1      week.   COURSE OF HOSPITALIZATION:  1. Abdominal pain.  This patient presented with worsening abdominal      pain which is epigastric.  This patient is known for GERD and has      been taking Prilosec with good control of her symptoms.  The      patient had some radiation to her chest and she was admitted to the      hospital for further evaluation.  She was monitored in telemetry      and her tele showed  no evidence of acute problem.  Three sets of      troponin and cardiac enzymes were done and they came to be      negative.  Right in the hospital she was started on Protonix 40 mg      once a day.  She was initially on clear liquid diet and then her      diet was advanced as tolerated.  The patient as a matter of fact      she improved very well.  Her abdominal pain resolved.  Does not      have any nausea or vomiting.  She tolerated the diet very well and      at the time of discharge, she was on regular diet without any      problems.  This patient has been followed by [facility] and I      advised her to see [facility] office within a week for further      evaluation.  2. Sclerotic lesion in the medial right iliac pon.  That was      incidental finding during her CT scan of the pelvis and bone scan      was done for further evaluation.  The bone scan showed no      positivity in the right iliac pon which made this lesion benign      bony lesion.  The patient needs to follow on that with her primary      doctor as an outpatient.  I explained in detail to her and she      voiced understanding.   Otherwise, other medical conditions remained stable during this  hospitalization.  The patient will be discharged home on all  preadmission medications.  She had an appointment with her primary  doctor tomorrow which is September 06, 2008 and she was advised to keep that  appointment.  I also advised her to follow with her gastroenterologist,  Dr. Russella Dar.  I think she is due for a colonoscopy and to be reevaluated  for repeat upper endoscopy if her pain persists.   TOTAL ASSESSMENT TIME:  40 minutes.      Mandy Gardener, MD  Electronically Signed     NAE/MEDQ  D:  09/05/2008  T:  09/05/2008  Job:  [id number]

## 2010-10-01 NOTE — H&P (Signed)
NAMEGRIER, CZERWINSKI NO.:  [id number]   MEDICAL RECORD NO.:  [id number]          PATIENT TYPE:  EMS   LOCATION:  MAJO                         FACILITY:  [facility]   PHYSICIAN:  Elliot Cousin, M.D.    DATE OF BIRTH:  [birth year]   DATE OF ADMISSION:  09/03/2008  DATE OF DISCHARGE:                              HISTORY & PHYSICAL   PRIMARY CARE PHYSICIAN:  Ralene Ok, M.D.   PRIMARY GASTROENTEROLOGIST:  Venita Lick. Russella Dar, MD, Clementeen Graham   CHIEF COMPLAINT:  My stomach is hurting.   HISTORY OF PRESENT ILLNESS:  The patient is a 70 year old woman with a  past medical history significant for diverticulosis, irritable bowel  syndrome, hypertension, and hiatal hernia.  She presents to the  emergency department with a chief complaint of epigastric abdominal  pain.  She points to her epigastrium and states that her stomach started  hurting this morning.  It initially started in the lower abdomen but  radiated to the epigastrium.  The pain was initially crampy in origin.  She rated the pain a 10/10 in intensity at the time, and upon her  presentation to the emergency department tonight, she still rates it a  10/10 in intensity.  She has had intermittent nausea but no vomiting.  She had two bowel movements earlier today which were brown and not  loose.  She denies bright red blood per rectum and black tarry stools.  She has had neither constipation nor diarrhea.  She has had no vomiting.  Two weeks ago, however, she says that she had a stomach virus and  experienced several days of nausea, vomiting and diarrhea.  She has had  no vomiting or diarrhea in approximately 1-1/2 weeks.  The pain in her  epigastrium sometimes radiates to the right chest.  At times, the pain  is and was accompanied with sweating, shortness of breath, and  lightheadedness.  The pain sometimes radiates to the mid back as well.  She denies any recent treatment with antibiotics.  She has not started  any new  medications lately.  She did eat out yesterday at a local  restaurant with her husband.  Her husband, however, has no  gastrointestinal symptoms.   When the patient arrived to the emergency department, she was given two  sublingual nitroglycerin which did not appear to relieve her pain.  She  was also given aspirin by the emergency department physician.  She is  currently afebrile and hemodynamically stable.  Her lab data are  significant for a D-dimer of 0.49, normal lipase of 25, mildly low  potassium of 3.4, mildly elevated SGOT of 57 and a normal white blood  cell count of 10.2.  CT scan of the abdomen and pelvis was ordered by  the emergency department physician and it reveals no acute intrapelvic  or intraabdominal findings, although colonic diverticulosis was noted.  CT angiogram of the chest reveals mild pulmonary emphysema, but no  evidence of acute pulmonary embolism or other active disease.  The  patient is being admitted for further evaluation and management.   Of  note, the patient does acknowledge some discomfort with urination  over the past couple of days.   PAST MEDICAL HISTORY:  1. Hypertension.  2. Hypercholesterolemia.  3. Osteoporosis and osteopenia.  4. Depression.  5. History of shingles.  6. C diff colitis during the September 2002 hospitalization.  7. Colonic diverticulosis.  8. Degenerative joint disease.  9. 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  10.History of kidney stones.  11.Irritable bowel syndrome.  12.Status post hysterectomy in 1977.  13.Status post parathyroid excision in June 2009 at [facility]      for hypercalcemia.  14.Status post cholecystectomy in 2001.  15.Lumbar surgery in 2001.  16.Hiatal hernia.  17.Status post left breast tumor excision, benign, in 2001.  18.Status post bladder tack surgery and vaginal prolapse surgery in      2001.  19.Bilateral knee arthroscopies in 1993 and 1997.   MEDICATIONS:  1.  Azor 10/40 one tablet daily.  2. Evista 60 mg daily.  3. Metoprolol tartrate 50 mg daily.  4. Lipitor 80 mg daily.  5. Prilosec 20 mg daily.  6. Budeprion SR 150 mg daily.   ALLERGIES:  THE PATIENT HAS AN ALLERGY TO KEFLEX WHICH CAUSES A RASH.  SHE ALSO HAS AN ALLERGY TO LODINE WHICH CAUSES A RASH.   SOCIAL HISTORY:  The patient is married.  She lives with her husband in  [place], [place].  She is retired from [employer].  She denies tobacco, alcohol and illicit drug use.   FAMILY HISTORY:  Her mother died of complications from a motor vehicle  accident.  Her father was murdered.   REVIEW OF SYSTEMS:  The patient's review of systems is positive for  discomfort with urination, arthritic pain in her lower back and knees.  Otherwise, review of systems is negative.   PHYSICAL EXAMINATION:  VITAL SIGNS:  Temperature 97.2, blood pressure  132/57, pulse 74, respiratory rate 11 (ranging from 11-20), oxygen  saturation 96% on room air.  GENERAL:  The patient is a pleasant 70 year old alert Caucasian woman  who is currently sitting up in bed in no acute distress, although she  does appear ill.  HEENT:  Head is normocephalic nontraumatic.  Pupils equal, round,  reactive to light.  Extraocular muscles are intact.  Conjunctivae are  clear.  Sclerae are white.  Nasal mucosa is mildly dry.  No sinus  tenderness.  Tympanic membranes not examined.  Oropharynx reveals mildly  dry mucous membranes.  No posterior exudates or erythema.  NECK:  Supple.  No adenopathy, no thyromegaly, no bruit or JVD.  LUNGS:  Clear to auscultation bilaterally.  HEART:  S1-S2 with no murmurs, rubs or gallops.  ABDOMEN:  Obese positive bowel sounds, soft, mildly tender in the  epigastrium without any evidence of hepatosplenomegaly, masses,  distention, or rigidity.  GU/RECTAL:  Deferred.  EXTREMITIES:  Multiple varicosities of the lower extremities.  Pedal  pulses palpable bilaterally.  No  pretibial edema and no pedal edema.  NEUROLOGIC:  The patient is alert and oriented x3.  Cranial nerves II-  XII are intact.  Strength is 5/5 throughout.  Sensation is intact.   ADMISSION LABORATORIES:  1. EKG reveals normal sinus rhythm with a heart rate of 80 beats per      minute and no ST or T-wave abnormalities.  Chest x-ray reveals      stable exam.  No active cardiopulmonary disease.  2. CT scan of the chest.  Results are above.  3. CT scan of the abdomen and pelvis.  The results are above.  In      addition, the CT scan of the pelvis did reveal a 2.4 x 2.6 cm      medial right iliac sclerotic lesion, indeterminate.  CK-MB less      than 1.0, troponin I less than 0.05, myoglobin 48.8, D-dimer 0.49,      lipase 25.  Sodium 141, potassium 3.4, chloride 104, CO2 30,      glucose 113, BUN 16, creatinine 0.73, total bilirubin 1.1, alkaline      phosphatase 134, SGOT 57, SGPT 31, total protein 6.8, albumin 3.8,      calcium 8.5.  WBC 10.2, hemoglobin 13.9, platelets 271.   ASSESSMENT:  1. Epigastric abdominal pain/chest pain.  The patient clearly      complains of epigastric abdominal pain as she pointed to the      epigastrium during the questioning and history and physical.      However, there was apparently some radiation to the chest.  When      she was evaluated by the EMS and the emergency department      physician, she was given aspirin and sublingual nitroglycerin for a      complaint of chest pain.  Her EKG is without any acute      abnormalities.  The CT of the abdomen and pelvis is without any      acute intra-abdominal and/or intrapelvic findings.  Given the      nature of the pain, we will consider gastroesophageal reflux      disease, acute gastritis, acute pancreatitis, although less likely      with a normal lipase, and/or musculoskeletal pain.  Of note, she      did have some crampy lower abdominal pain earlier in the day and      given her positive review of systems  for discomfort with urination,      we will also consider acute pyelonephritis or cystitis.  2. Mildly elevated SGOT of 57.  The significance of this isolated      elevation is unclear at this time.  She is status post      cholecystectomy back in 2001.  3. Mildly elevated D-dimer with a negative CT angiogram for pulmonary      embolism.  4. Hypokalemia.  The patient's serum potassium is borderline low at      3.4.  Magnesium deficiency will need to be ruled out.  5. A 2.4 x 2.6 cm sclerotic lesion within the medial right iliac bone      per CT scan of the pelvis.  This is an incidental finding.  A bone      scan will be ordered for further clarification.   PLAN:  1. The patient is being admitted for further evaluation and      management.  2. We will check an urinalysis culture and sensitivity.  Will also      complete the cardiac workup by ordering cardiac enzymes, fasting      lipid panel, and follow-up EKG.  3. Will check a TSH and free T4 as well.  4. Will order a bone scan to evaluate the iliac bone lesion.  5. Will start a clear liquid diet.  Will replete with normal saline      with potassium chloride added.  The hypokalemia will be treated via      the IV fluids.  6. We will  start IV Protonix and scheduled Mylanta for 48 hours.  7. Will consider gastroenterology consultation with Dr. Russella Dar for an      EGD only if needed.  8. Will check a blood magnesium level to rule out deficiency.      Elliot Cousin, M.D.  Electronically Signed     DF/MEDQ  D:  09/03/2008  T:  09/03/2008  Job:  [id number]   cc:   Ralene Ok, M.D.  Venita Lick. Russella Dar, MD, Clementeen Graham

## 2010-10-04 NOTE — Op Note (Signed)
[place]  Patient:    STEFHANIE KACHMAR                          MRN: [id number] Proc. Date: 06/14/99 Adm. Date:  [id number] Attending:  Henrene Dodge CC:         Anselm Pancoast. Zachery Dakins, M.D.             Venita Lick. Pleas Koch., M.D. [facility]             Patients chart                           Operative Report  PREOPERATIVE DIAGNOSIS:  Chronic cholelithiasis.  POSTOPERATIVE DIAGNOSIS:  Chronic cholelithiasis.  OPERATION:  Laparoscopic cholecystectomy and cholangiogram.  SURGEON:  Anselm Pancoast. Zachery Dakins, M.D.  ASSISTANT:  Adolph Pollack, M.D.  ANESTHESIA:  General anesthesia.  INDICATION:  Denisa Enterline is a 70 year old Caucasian female who was referred to me by Dr. Georg Ruddle. Viviann Spare originally for a breast mass but she recently has had a recurrent episodes of epigastric pain.  She has a problem with her back and is scheduled for disc surgery in approximately a month but her husband had had a previous cholecystectomy performed.  An ultrasound was performed on the patient  with the findings of stone and she was referred to our office.  On physical exam, she is not acutely tender.  Her pain is certainly consistent ith gallstones.  She has mainly epigastric and right upper quadrant but occasionally some pain in the small of the back and I recommended that she proceed with a laparoscopic cholecystectomy and cholangiogram.  The patient was in agreement with this and is a 24-hour evaluation.  DESCRIPTION OF PROCEDURE:  Preoperatively, she was given antibiotics and taken o the operating suite.  After general anesthesia, an oral tube to the stomach, endotracheal tube, then the abdomen was prepped with Betadine surgical scrub and solution and draped in a sterile manner.  The patient has had a previous hysterectomy through a lower Pfannenstiel-type incision.  A small incision was made at the umbilicus.  Sharp dissection down through  the skin, subcutaneous, and fascia picked up and then a small opening made. Entered into the peritoneal cavity.  She does have some adhesions in the lower portion ut we worked above these.  Traction sutures were placed and a Hasson cannula was introduced.  Carbon dioxide was infused.  The gallbladder was slightly dilated, significantly thickened with a lot of adhesions around it, and the upper 10 mm trocar was placed in direct vision and two lateral 5 mm trocars were placed by Dr. Adolph Pollack.  We carefully dissected the adhesions down from the gallbladder sort of with dissecting these away.  The duodenum was kind of adherent up to this but this was carefully dissected down.  Then the cystic artery and cystic duct were identified at the junction with the gallbladder.  The cystic artery was doubly clipped proximally, single distally, and divided.  This then exposed the cystic duct nicely.  It was clipped flush with the gallbladder and then a small opening made.  A cholangiocatheter introduced within this and operative cholangiogram obtained.  There was a very long cystic duct kind of tapering with the common bile duct but then good prompt filling of the common bile duct and flow into the duodenum.  The cystic duct was triply clipped and  then divided.  Then the gallbladder was freed from its bed using the hook electrocautery.  A distal portion of the gallbladder was sort of intrahepatic and this was dissected free and then the bed was coagulated.  Good hemostasis was obtained.  The gallbladder withdrawn through the umbilicus, reinspected, and no evidence of any bleeding.  Then the irrigating fluid removed.  I put a figure-of-eight suture in the umbilicus in addition to the traction suture that originally had been placed which were all tied and then the lateral 5 mm trocars having removed.  Carbon dioxide was released and then the trocar  was withdrawn.  Subcutaneous wounds were closed with 4-0 Vicryl, benzoin, and Steri-Strips on the skin.  The patient tolerated the procedure nicely.  She should be able to release in the morning.DD:  06/14/99 TD:  06/16/99 Job: [id number]

## 2010-10-04 NOTE — Op Note (Signed)
[facility]  Patient:    [name], [name]                     MRN: [id number] Proc. Date: 05/05/00 Adm. Date:  [id number] Attending:  Evlyn Clines CC:         Georg Ruddle. Viviann Spare, M.D.                           Operative Report  PROCEDURE PERFORMED:  Vaginal sacropexy, enterocele repair and anterior urethropexy.  PREOPERATIVE DIAGNOSIS:  Vault prolapse and stress incontinence.  POSTOPERATIVE DIAGNOSIS:  Vault prolapse and stress incontinence.  SURGEON:  Excell Seltzer. Annabell Howells, M.D.  ASSISTANT:  Lucrezia Starch. Ovidio Hanger, M.D.  ANESTHESIA:  General.  DRAINS:  Foley.  COMPLICATIONS:  None.  INDICATIONS:  [name] is a 70 year old white female with vault prolapse and mild stress incontinence.  She is undergo vaginal sacropexy, enterocele repair and anterior urethropexy.  DESCRIPTION OF PROCEDURE:  The patient was taken to the operating room where a general anesthetic was induced.  She was fitted with thigh TED and placed in the low lithotomy position.  Her lower abdomen was shaved.  She was prepped with Betadine solution and draped in the usual sterile fashion.  A Foley catheter was inserted and the bladder was drained.  A Pfannenstiel incision was made with the knife.  This was carried down through the subcutaneous and fascia layers with the Bovie.  The rectus muscle was parted bluntly in the midline.  The peritoneum was opened.  A Balfour retractor was placed.  The bowel was packed cephalad with moist packs and the patient was placed in slight Trendelenburg position.  The sacral promontory was identified and an incision was made vertically over the promontory and with a combination of Bovie and blunt dissection, the periosteum and sacral promontory was identified.  Two 2-0 Prolene stitches were placed in a figure-of-eight fashion in the periosteum and sacral promontory.  These were snapped and placed aside.  A narrow Deaver was then placed vaginally  to retract the apex of the vault inferiorly.  The enterocele was then closed using 3-0 chromic pursestring suture.  Once the enterocele had been closed, the apex of the vaginal vault was identified.  The peritoneum was incised horizontally over the apex of the vagina and reflected back exposing the vaginal wall both anteriorly and posteriorly.  A 2 x 6 inch Prolene mesh was soaked in antibiotic solution.  It was then trimmed to a width of approximately 1-1/4 inches and left full length.  Two 2-0 Prolene sutures were then placed on the posterior wall of the vagina in a figure-of-eight fashion.  These were brought through the mesh and tied.  Two 2-0 Prolene sutures were placed on the anterior vaginal wall in a figure-of-eight fashion and placed through the mesh and tied.  Two additional sutures were placed laterally at the apex of the vaginal vault and tied through the mesh as well.  At this point, the previously placed sacral stitches were placed through the mesh and tied down.  There was minimal tension on the repair.  Once the vaginal sacropexy had been completed, a flap of peritoneum was closed over the mesh with running 3-0 chromic stitch.  I then dissected into the retropubic space.  The bladder neck was identified and dressed with a Babcock figure-of-eight 0 chromic stitch placed in the midline at the bladder neck and then passed through  the pubic periosteum and tied.  An additional stitch was placed at the dome of the bladder and placed to the anterior rectus fascia.  Once these ties were placed and trimmed, the bladder blade and the packs were removed.  The peritoneum was closed with a running 0 chromic stitch.  The muscle was reapproximated in the midline with interrupted 0 chromic stitches.  The rectus fascia was then closed in a single layer using running #1 PDS.  The wound was irrigated.  The skin was closed with clips. The Foley catheter was placed to straight drainage.  Sponge,  instrument and needle counts were correct.  The patient was taken out of the lithotomy position and her anesthetic was reversed.  A dressing had been applied to the wound.  She was admitted to the recovery room in stable condition.  There were no complications. DD:  05/05/00 TD:  05/06/00 Job: [id number]

## 2010-10-04 NOTE — H&P (Signed)
[place]  Patient:    [name], [name] Visit Number: [id number] MRN: [id number]          Service Type: Attending:  Carlisle Beers. Dorothyann Gibbs, M.D. Dictated by:   Jamelle Rushing, P.A. Adm. Date:  02/08/01                           History and Physical  DATE OF BIRTH:  15-Apr-1941  CHIEF COMPLAINT:  Left knee pain.  HISTORY OF PRESENT OF ILLNESS:  The patient is a 70 year old white female with initially having pain and clicking in her knee bilaterally and having an arthroscopic evaluation and debridement with improvement lasting three to four years. Over the last year or so, the patient has noted return of her pain and swelling. Cortisone injections would only give temporary relief. Over the last few months, problems have significantly worsened where she has noted a significant amount of clicking, grinding, and swelling in her knees. She has a significant amount of night pain after being up her feet all day. The pain is described as a constant, deep, aching sensation with a stabbing pain with awkward movements. She denies any radiation. She has difficulty getting in and out of a car and up and down stairs.  ALLERGIES:  KEFLEX and LODINE.  CURRENT MEDICATIONS:  1. Prilosec 20 mg p.o. b.i.d.  2. Robinul Forte 2 mg b.i.d.  3. ______ 0.125 mg under tongue p.r.n. cramps.  4. Premarin 0.625 mg p.o. q.d.  5. Vioxx 25 mg p.o. q.d.  6. Wellbutrin SR 150 mg p.o. b.i.d.  7. Detrol SR 150 mg q.d.  8. Urised tablets one to two tablets p.r.n. bladder irritation.  9. diazepam 5 mg q.8h. p.r.n. muscle spasms. 10. Vicodin ES one tablet every 6 hours p.r.n. pain.  MEDICAL HISTORY:  1. Diverticulosis.  2. Irritable bowel syndrome.  3. Cyst on right kidney.  4. Hiatal hernia.  5. The patient denies diabetes, hypertension, thyroid disease, peptic ulcer     or heart disease, or asthma.  PAST SURGICAL HISTORY:  1. Hysterectomy in 1977.  2.  Cholecystectomy in 2001.  3. Lumbar surgery, 2001.  4. Bilateral knee arthroscopies in 1993 and 1997.  5. Breast surgery with tumor on the left, 2001.  6. Bladder tack and vaginal prolapse in 2001.  The patient denies any complications with any of these procedures.  SOCIAL HISTORY:  The patient is a 70 year old white female who states she smokes about a half of a pack a day for the last 30 years. Denies any alcohol use. She is married. She has two grown children. She lives in a [place] house. She is currently on disability due to her back.  FAMILY PHYSICIAN:  Lilly Cove, M.D., [phone].  FAMILY MEDICAL HISTORY:  Mother is deceased from a motor vehicle accident. Father is deceased from a head injury. Got one brother deceased from pneumonia as a child. Three brothers and three sisters alive with a history of hypertension and diabetes.  REVIEW OF SYSTEMS:  Positive for upper and lower dentures. Glasses at all times. Frequent nausea due to her irritable bowel syndrome and diverticulosis. Positive reflux which is improved with Prilosec, frequent pain with urination, increased urgency and frequency due to her urinary incontinence from multiple bladder surgeries.  PHYSICAL EXAMINATION:  VITAL SIGNS:  Blood pressure is 184/92, pulse of 96, respirations 14, temperature is 98, height is 5 feet 5 inches, weight is  155 pounds.  GENERAL:  This is a healthy appearing, well-developed, adult female. She is able to ambulate without significant limp. She is able to get on and off the exam table without much difficulty.  HEENT:  Head is normocephalic, atraumatic, nontender over maxillary or frontal sinuses. Pupils are equal, round, and reactive. Left eye does deviate laterally. Extraocular movements are intact. Sclerae were nonicteric. Conjunctivae is pink and moist. External ears were without deformities. Canals patent. TMs pearly gray and intact. Gross hearing is intact. Nasal septum  was midline. Mucous membranes were pink and moist. No polyps noted. Oral buccal mucosa was pink and moist. Dentures were in place. Uvula was midline.  NECK:  Supple. The patient had good range of motion of her cervical spine. No palpable lymphadenopathy. The thyroid gland was nontender.  CHEST:  Lung sounds were clear and equal bilaterally. No wheezes, rhonchi, rales, or rubs noted.  ABDOMEN:  Round, soft, nontender. Bowel sounds were present throughout. No palpable hepatosplenomegaly.  EXTREMITIES:  Upper extremities were symmetrically sized and shaped. The patient had good range of motion of her shoulders, elbows, and wrists and 5/5 motor strength in all muscle groups.  Left hip had pain with internal-external rotation, full extension-flexion to 100 degrees. Right hip had no pain with internal-external rotation and was full range of motion.  Right knee was no sign of erythema or ecchymosis. No palpable effusion. She had slight tenderness along the joint line. She had 0 to 120 degrees range of motion. No valgus varus laxity and no anterior or posterior draw. Left knee had a significant medial joint line tenderness. None over the lateral. No effusion. She had 0 to 115 degrees range of motion with no valgus varus laxity and no anterior or posterior draw. Bilateral ankles were symmetrical with good range of motion.  PERIPHERAL VASCULATURE:  Carotid pulses:  No bruits, 2+. Radial pulses 2+, femoral pulses not palpable, posterior tibial pulses 1+. The patient had no lower extremity edema or venous status changes noted.  NEUROLOGICAL:  The patient was conscious, alert, and appropriate. Held an easy conversation with the examiner. Cranial nerves 2 through 12 are grossly intact. Deep tendon reflexes of the upper and lower extremities were symmetrical. The patient was grossly intact to light touch sensation from head to toe.  BREASTS/RECTAL/GENITOURINARY:  Deferred at this  time.  IMPRESSION:  1. End-stage osteoarthritis, left knee.  2. Osteoarthritis right knee.   3. Diverticulosis.  4. Irritable bowel syndrome.  5. Cyst on right kidney.  6. Urinary incontinence.  7. Depression.  8. History of kidney stones.  PLAN:  The patient will be admitted to [facility] on February 08, 2001, under the care of John L. Dorothyann Gibbs, M.D. The patient will undergo routine labs and tests prior to having a left total knee arthroplasty performed. Dictated by:   Jamelle Rushing, P.A. Attending:  Carlisle Beers. Dorothyann Gibbs, M.D. DD:  02/10/01 TD:  02/10/01 Job: [id number]

## 2010-10-04 NOTE — Discharge Summary (Signed)
[place]. [facility]  Patient:    [name], [name]                         MRN: [id number] Adm. Date:  [id number] Disc. Date: [id number] Attending:  Tressie Stalker D                           Discharge Summary  BRIEF HISTORY:  The patient is a 70 year old white female who has had back pain for almost 10 years.  It became intractable March of 2000.  I treated her with medications, steroid injections, physical therapy, etc.  She failed to improve.  She was worked up with a lumbar MRI and a lumbar myelogram CT which demonstrated a mobile spondylolisthesis at L4/5 with degenerative disc disease at that level and L5-S1.  The patient failed medical management; therefore, weighed the risks, benefits, and alternatives to surgery and decided to proceed with a lumbar decompression, stabilization, and fusion.  For past medical history, past surgical history, medication to admission, drug allergies, family medical history, social history, admission history and physical exam, admission plans, assessment, please refer to the typed history and physical.  HOSPITAL COURSE:  I admitted the patient to [facility] on July 15, 1999 with the diagnosis of L4/5 acquired grade spondylolisthesis and L4/5 and L5-S1 degenerative disc disease, spondylosis, and spinal stenosis, lumbago, lumbar radiculopathy.  On the day of admission, I performed a L4/5 and L5-S1 posterior lumbar interbody fusion with insertion of Synthes bone dowels and surgical dynamics titanium pedicle screws from L4 to S1.  The surgery went well without complications.  For full details of this operation, please refer to typed operative note.  The patients postoperative course was essentially unremarkable.  She did have a  fever of 101.0 on July 17, 1999 but no other clinical signs of infection, i.e. her wound looked good and her lungs were clear.  By July 19, 1999, she was afebrile.   Vital signs were stable.  She was eating well, ambulating well, and her wound was healing well without signs of infection. She was requesting discharge home.  I therefore discharged her home on July 19, 1999.  DISCHARGE INSTRUCTIONS:  The patient was given written discharge instructions and instructed to follow up with me in three weeks.  Wear her lumbosacral corset while out of bed and to resume her outpatient medication regimen except Celebrex.  DISCHARGE MEDICATIONS:  As above, resume outpatient medical regimen except Celebrex and she was given a prescription for Percocet 5, #60 with 1-2 p.o. q.4h. p.r.n.  pain with limit eight per day, no refill.  Valium 5 mg #50, 1 p.o. q.8h. p.r.n.  muscle spasm, no refills.  FINAL DIAGNOSES: 1. Lumbar vertebrae-4/5 and lumbar vertebrae-5/sacral vertebrae-1    degenerative disc disease, spondylosis, and spinal stenosis at lumbar    vertebrae-4/5. 2. Grade 1 spondylolisthesis.  PROCEDURES:  L4/5 and L5-S1 posterior lumbar interbody fusion with insertion of  bilateral Synthes bone dowels and posterior segmental instrumentation from L4 to S1 using pedicle screws and rods with a posterolateral fusion. DD:  07/19/99 TD:  07/20/99 Job: [id number]

## 2010-10-04 NOTE — Discharge Summary (Signed)
. [facility]  Patient:    [name]. Visit Number: [id number] MRN: [id number]          Service Type: Attending:  Carlisle Beers. Dorothyann Gibbs, M.D. Dictated by:   Jamelle Rushing, P.A. Adm. Date:  02/08/01 Disc. Date: 02/11/01   CC:         Lilly Cove, M.D.   Discharge Summary  ADMISSION DIAGNOSES:  1. End-stage osteoarthritis, left knee.  2. Diverticulosis.  3. Irritable bowel syndrome.  4. Urinary incontinence.  5. Depression.  6. History of kidney stones.  DISCHARGE DIAGNOSES:  1. Left total knee arthroplasty.  2. Postoperative blood loss anemia.  3. Diverticulosis.  4. Irritable bowel syndrome.  5. Urinary incontinence.  6. Depression.  7. History of kidney stones.  HISTORY OF PRESENT ILLNESS:  The patient is a 70 year old white female with a history of bilateral arthroscopic procedures for swelling and pain in 1993 ad 1997.  The patient had improvement for several years after these procedures, but then developed return of swelling and pain.  The patient had cortisone injections several times with some short term improvement, but then over the last several months or so, she has had significantly worsening of her symptoms.  The pain currently is constant, achy sensation with stabbing pains with weightbearing and awkward movements.  She does have clicking, grinding and swelling and night pain.  She has difficulty with stairs and in and out of the car.  She is not using an assistive device.  DRUG ALLERGIES:  KEFLEX, LODINE.  CURRENT MEDICATIONS:  1. Prilosec 20 mg p.o. b.i.d.  2. Robinul Forte 2 mg b.i.d.  3. _____ 0.125 mg 2 tablets p.o. p.r.n.  4. Premarin 0.625 mg q.d.  5. Vioxx 25 mg q.d.  6. Wellbutrin SR 150 mg p.o. b.i.d.  7. Detrol SR 150 mg p.o. q.d.  8. Urised 1-2 tablets p.o. p.r.n.  9. Diazepam 5 mg q.8h. p.r.n. 10. Vicodin ES q.6h. p.r.n.  SURGICAL PROCEDURE:  On February 08, 2001, the patient was taken to the OR  by Dr. Jonny Ruiz L. Rendall, assisted by Dr. Vear Clock.  Under general anesthesia, the patient underwent a left total knee arthroplasty due to bone-on-bone medial compartment.  The patient tolerated this procedure well.  Operative time was approximately one hour.  No drains were left in place.  The patient was transferred to the recovery room in good condition.  The following routine consults were requested:  Physical therapy, occupational therapy, care management.  HOSPITAL COURSE:  On February 08, 2001, the patient was admitted to [facility] under the care of Dr. Jonny Ruiz L. Rendall, he was taken to the OR for a left total knee arthroplasty.  The patient tolerated this procedure well and was transferred to the recovery room and then to the orthopedic floor in good condition.  The patient was placed on Arixtra 2.5 mg q.d. for a total of seven days for routine DVT prophylaxis.  The patient then incurred a total of three days postoperative course in which she did develop some postoperative blood loss anemia, but was asymptomatic.  The patient initially did develop a slight perineal palsy, but this appeared to be resolved on its own and the patients lower extremity remained neuromotor vascularly intact.  Her vital signs remained stable, she was afebrile and she progressed well with the use of the CPM.  Her wound remained benign for any signs of infection.  She worked well with physical therapy, but it was  felt on postoperative day #3, that a short stay at the rehabilitation center would be the best option prior to being discharged to home.  A vet became available and she was transferred to rehab.  LABORATORY DATA:  A venous Doppler done on September 27 shows no evidence of DVTs, superficial thrombus or Bakers cyst bilaterally.  Chest x-ray on July 11, 1999, shows probable chronic obstructive pulmonary disease with no active lung disease.  On September 25, CBC, WBCs 10.4, down  from 11.1 day previous.  Hemoglobin at 9.8, hematocrit at 28.1, platelets 230.  Sodium of 137, potassium of 3.5, glucose 136, BUN 5, creatinine 0.6.  Urinalysis on admission was negative.  DISCHARGE MEDICATIONS:  1. Senokot 8.6 mg p.o. b.i.d.  2. Robinul 2 mg p.o. b.i.d.  3. Protonix 40 mg p.o. b.i.d.  4. Arixtra 2.5 mg subcutaneous q.24h.  5. Detrol 4 mg p.o. q.d.  6. OxyContin SR 10 mg p.o. q.12h.  7. Laxative or enema of choice.  8. Tylenol 650 mg p.o. q.4-6h. p.r.n.  9. Valium 5 mg p.o. q.8h. p.r.n. 10. Percocet 1-2 tablets every 4-6 hours p.r.n. pain.  DISCHARGE INSTRUCTIONS:  To rehabilitation.  MEDICATIONS:  Patient is to continue meds on discharge from ortho floor.  ACTIVITY:  Patient may be weightbearing as tolerated with the use of a walker and close supervision.  DIET:  No restrictions.  WOUND CARE:  The patient is to have wound checked daily for infections. Staples removed on postoperative day #10-14.  Arixtra is to be given for a total of seven days.  FOLLOWUP:  The patient is to have a follow-up appointment with Dr. Priscille Kluver two two weeks from date of discharge from rehabilitation services. Dictated by:   Jamelle Rushing, P.A. Attending:  Carlisle Beers. Dorothyann Gibbs, M.D. DD:  02/27/01 TD:  02/28/01 Job: [id number]

## 2010-10-04 NOTE — H&P (Signed)
[place]. [facility]  Patient:    [name], [name]                         MRN: [id number] Adm. Date:  [id number] Attending:  Tressie Stalker D                         History and Physical  CHIEF COMPLAINT:  Back pain and left hip pain.  HISTORY OF PRESENT ILLNESS:  The patient is a 70 year old white female who complains of approximately a nine-year history of back pain.  She has been seen by multiple physicians in the past, and has been treated with medications, physical therapy, steroid injections, etc.  The pain has worsened in March 2000, and she has been seen in the emergency room since then.  She was eventually referred to me.  The patient complains of predominantly left-sided low back pain which radiates nto her left buttock and hip region.  Occasionally radiates further down her leg. t worsens if she stands or walks and improves if she lies down.  She has some numbness and paresthesia in her left leg.  She has not noted any weakness or ataxia or incontinence.  I treated the patient with medications, physical therapy, etc.  She did not improve and I performed a lumbar myelogram CT on her, and it demonstrated a mobile spondylolisthesis at L4-5 with degenerative disk disease at L5-S1.  PAST MEDICAL HISTORY: 1. Positive for depression. 2. Knee osteoarthritis. 3. Nephrolithiasis. 4. Benign breast lesions.  CURRENT MEDICATIONS PRIOR TO ADMISSION: 1. Celebrex 200 mg q.d. 2. Premarin 0.625 mg p.o. q.d. 3. Zoloft 50 mg q.d. 4. Oxycodone p.r.n. 5. Hydrocodone p.r.n.  ALLERGIES:  LODINE causes welts and KEFLEX causes a rash.  PAST SURGICAL HISTORY: 1. Hysterectomy in 1977. 2. Knee surgery bilateral in 1992, and 1997. 3. Breast biopsies in 1970, 1976, 1987, and 1999. 4. Nephrolithiasis in 1993, and 1997.  FAMILY HISTORY:  The patients mother died at age 17 from a motor vehicle accident. The father was murdered at age 17.  She has three  sisters who have high blood pressure and diabetes mellitus.  She has a brother with hypertension.  SOCIAL HISTORY:  The patient is married.  She has two children.  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  She has been out of ork for quite a long time, secondary to her back troubles.  She smokes 1/2 pack of cigarettes times approximately 40 years.  She denies ethanol or drug use.  REVIEW OF SYSTEMS:  Negative except as above.  PHYSICAL EXAMINATION:  GENERAL:  A pleasant well-developed, well-nourished 70 year old white female, in no apparent distress.  Height 5 feet 5 inches, weight 152 pounds.  HEENT:  Normocephalic, atraumatic.  Pupils equal, round, reactive to light. Extraocular muscles intact.  Occasional strabismus.  Sclerae white. Conjunctivae pink.  Oropharynx benign.  Uvula midline.  NECK:  Supple, no masses, meningismus, deformities, tracheal deviation, jugular  venous distention, or carotid bruits.  She has tenderness on deep palpation of he vertebrae prominence.  Spurlings test is negative bilaterally.  Lhermittes sign  was not present.  THORAX:  Symmetric.  LUNGS:  Clear to auscultation without rales, rhonchi, or wheezes.  HEART:  A regular rate and rhythm.  ABDOMEN:  Soft, nontender.  No guarding or rebound.  EXTREMITIES:  No obvious deformities.  BACK:  Demonstrates some small subcutaneous mass in  her left sacral iliac regions. It feels like a lipoma.  There is no point tenderness or deformities.  Straight leg raise testing is positive on the left and negative on the right.  Fabere testing is equivalent on the left, negative on the right.  NEUROLOGIC:  The patient is alert and oriented x 3.  Cranial nerves II-XII are grossly intact bilaterally, except she does have a left external strabismus and  some decreased vision in her left eye.  Motor strength is 5/5 in her bilateral deltoid, biceps, triceps, hand grip, wrist  extensor, interosseous, psoas, quadriceps, gastrocnemius, extensor hallucis long.  Cerebellar examination is intact to rapid alternating movements of the upper extremities bilaterally. Deep tendon reflexes are 1/4 in her bilateral brachial radialis, 1-2/4 in her bilateral biceps, triceps, quadriceps, and gastrocnemius.  She has bilateral flexor plantar reflexes.  No ankle clonus.  Sensory examination is grossly normal to light touch and pinprick sensation in all tested dermatomes bilaterally.  DIAGNOSTIC STUDIES:  The patient had a lumbar MRI performed at [facility] on March 21, 1998, demonstrating grade 1 spondylolisthesis at L4-5.  She has significant degenerative disk disease at L5-S1, with modic-type changes.  She also had a lumbar myelogram, CT performed at [facility] which demonstrates a mobile spondylolisthesis with degenerative disk disease at L4-5 and L5-S1.  ASSESSMENT/PLAN: 1. L4-5 spondylolisthesis. 2. L4-5, L5-S1 degenerative disk disease. 3. Spondylosis. 4. Lumbago. 5. Lumbar radiculopathy.  PLAN:  I have discussed the situation with the patient and with her daughter.  discussed the various treatment options, including doing nothing, continuing with medical management, steroid injections, physical therapy, and surgery.  I have discussed her surgical options, including an L4-5 and L5-S1 laminectomy, posterior interbody fusion, and pedicle screw stabilization, fusion with bone dowls.  I have shown her surgical models and discussed the risks of surgery extensively.  The patient has thus far failed medical management, i.e. nonsurgical management, and therefore weighed the risks, benefits, and alternatives to surgery.  She has decided to proceed with the operation.  I did advise her to quit smoking prior o the fusion, as it would adversely effect fusion rates.  6. History of depression. 7. Knee osteoarthritis. 8.  Nephrolithiasis. DD:  07/15/99 TD:  07/15/99 Job: [id number]

## 2010-10-04 NOTE — Discharge Summary (Signed)
[facility]  Patient:    [name], [name]                     MRN: [id number] Adm. Date:  [id number] Disc. Date: [id number] Attending:  Evlyn Clines CC:         Lilly Cove, M.D.   Discharge Summary  HISTORY:  Ms. Estock is a 70 year old white female with a history of vaginal prolapse and mild incontinence.  She has elected to undergo vaginopexy and anterior urethropexy.  PAST MEDICAL HISTORY:  ALLERGIES:  KEFLEX, LODINE.  PREOPERATIVE MEDICATIONS: 1. Prilosec 20 mg daily. 2. ______  0.375 mg daily. 3. Premarin 0.625 daily. 4. Diazepam 5 mg p.r.n. 5. Wellbutrin 150 mg twice daily. 6. Hydrocodone p.r.n. 7. Vioxx 25 mg daily.  MEDICAL HISTORY:  Irritable bowel and kidney stones.  SURGICAL HISTORY:  Hysterectomy in 1977, bilateral breast biopsy, surgery on both knees, cholecystectomy, lumbar fusion, and two kidney stone procedures including lithotripsy.  For the details of the history and physical, please see the history assessment sheet and my office notes in the chart.  ACCESSORY CLINICAL INFORMATION:  Preoperative hemoglobin 14.6.  Chest x-ray with probable COPD.  EKG with normal sinus rhythm, left atrial enlargement, septal infarct, age undetermined.  HOSPITAL COURSE:  On the day of admission, the patient was taken to the operating room after receiving PO Tequin.  She underwent vaginopexy, enterocele repair, and anterior urethropexy through a Pfannenstiel incision. She tolerated the procedure well and was left with a Foley catheter. Postoperatively she did well.  Her Foley catheter was removed and her PAS hose and her oxygen.  She was having positive bowel sounds but was slightly distended.  She was given a Dulcolax suppository.  Her IV was decreased on the first postoperative day.  On the second postoperative day, she was having some abdominal pain following a coughing episode but was tolerating a diet and had a bowel movement.   She was voiding without complaints.  She was afebrile.  Her wound was intact.  Her abdomen was soft.  Her PCA was discontinued and IV was Heplock.  On the third postoperative day, she continued to improve.  She was tolerating a diet and had minimal pain.  She was afebrile.  Her wound was intact.  She was felt to be ready for discharge home with the final diagnosis of prolapse with enterocele and stress incontinence.  There were no complications during her admission.  DISCHARGE MEDICATIONS:  Vicodin and Levaquin.  She was instructed to follow up with me in a week for staple removal.  DISPOSITION:  To home.  CONDITION ON DISCHARGE:  Improved. DD:  05/28/00 TD:  05/28/00 Job: [id number]

## 2010-10-04 NOTE — Discharge Summary (Signed)
[place]. [facility]  Patient:    [name], [name] Visit Number: [id number] MRN: [id number]          Service Type: Attending:  Rande Brunt. Thomasena Edis, M.D. Dictated by:   Dian Situ, PA Adm. Date:  02/11/01 Disc. Date: 02/20/01   CC:         Lilly Cove, M.D.  John L. Dorothyann Gibbs, M.D.  Venita Lick. Pleas Koch., M.D. [facility]   Discharge Summary  DISCHARGE DIAGNOSES: 1. Status post left total knee replacement. 2. Citrobacter urinary tract infection, treated. 3. Clostridium difficile colitis. 4. Hypokalemia, resolved. 5. Postoperative anemia.  HISTORY OF PRESENT ILLNESS:  [name] is a 70 year old female with history of IBS, GERD, DVT, DJD, status post bilateral knee arthroscopy.  She continued with increased pain in left knee and elected to undergo left total knee replacement on February 08, 2001 by Dr. Priscille Kluver.  Postoperative is weight bearing as tolerated and on Arixtra for DVT prophylaxis.  Pain control has been reasonable.  There has been some question of peroneal nerve palsy on left.  She is currently guard assist for transfers, guard assist for ambulating 20 to 80 feet with standard walker and knee flexion at 93 degrees.  PAST MEDICAL HISTORY: Significant for IBS, depression, diverticulosis, renal calculi, hyperactive bladder, back surgery, GERD, cholecystectomy, hysterectomy.  ALLERGIES:  KEFLEX and LODINE.  SOCIAL HISTORY:  The patient is married.  Lives in one level home with no steps at entry.  She was independent prior to admission.  Does not use any alcohol.  Smokes 1/2 pack per day.  HOSPITAL COURSE:  [name] was admitted to rehab on February 11, 2001 for inpatient therapy to consist of PT and OT daily.  Past admission she was maintained on subcutaneous Arixtra for DVT prophylaxis.  She had increased complaints of reflux and Prilosec was increased to b.i.d.  Supplement was added for her postoperative anemia.  Labs  past admission showed the patient to be mildly hyperkalemic with potassium of 3.2 and this was supplemented.  A urinalysis was done and showed white count too numerous to count and many bacteria.  She was started on empiric Amoxicillin until cultures finalized.  Urine culture grew out Citrobacter koseri which was resistant to Ampicillin and she was started on Cipro and was treated with seven day course of antibiotics for this.  She has had problems with constipation and therefore her Robinul was held and Citrucel was resumed.  Follow up labs of February 15, 2001 shows postoperative anemia to be improving with hemoglobin 10.7, hematocrit 32.1, white count 423.  Check of electrolytes of February 15, 2001 showed sodium 139, potassium 4.2, chloride 104, CO2 26, BUN 9, creatinine 0.7, glucose 108.  On February 15, 2001 the patient with some GI symptoms with nausea, vomiting as well as diarrhea with question of gastroenteritis.  As symptoms continued on February 19, 2001 labs were checked showing leukocytosis with white count of 22.3 with toxic granulation.  Check of lytes showed mild hyponatremia with sodium at 134.  As the patient was noted to have diarrhea, urine culture checked for C. difficile was sent off.  Flat and upright abdominal films were done to rule out ileus.  This showed no acute abdominal process. No distension.  Her C. difficile cultures turned out positive and she was started on Flagyl for this. Repeat CBC obtained February 20, 2001 shows some improvement in leukocytosis with white count at 12.8.  The patient was discontinued on Flagyl 500 mg  per day to continue for 10 total days.  At the time of discharge she was modified independent for transfers, modified independent for ambulating greater than 150 feet, knee flexion was at 95 degrees.  She was modified independent for ADL needs.  Knee incision was noted to be healing well without any signs or symptoms of infection.  Staples  were discontinued and area Steri-Strips on postoperative day 12 without difficultly.  Further follow up therapies to include home health PT per [facility].  She is to continue on Ecotrin 325 mg a day for three additional weeks.  On February 20, 2001 the patient was discharged to home.  DISCHARGE MEDICATIONS:  1. Prilosec 20 mg b.i.d.  2. Robinul 42 mg b.i.d.  3. Premarin 0.625 mg q.d.  4. Vioxx 25 mg q.d.  5. Detrol SR b.i.d.  6. OxyContin CR b.i.d.  7. Flagyl 500 mg t.i.d. x10 days.  8. OxyCodone 5 to 10 mg p.o. q.4-6h. p.r.n. pain.  9. Wellbutrin 150 mg b.i.d. 10. Trinsicon one p.o. b.i.d.  ACTIVITY:  To use walker.  WOUND CARE:  Keep clean and dry.  SPECIAL INSTRUCTIONS:  The patient to follow up with Dr. Priscille Kluver in one to two weeks. Follow up with Dr. Ellwood Dense as needed. Dictated by:   Dian Situ, PA Attending:  Rande Brunt. Thomasena Edis, M.D. DD:  03/04/01 TD:  03/05/01 Job: [id number]

## 2010-10-04 NOTE — Op Note (Signed)
[place]. [facility]  Patient:    [name], [name] Visit Number: [id number] MRN: [id number]          Service Type: SUR Location: RCRM 2550 07 Attending Physician:  Carolan Shiver Ii Dictated by:   Carlisle Beers. Dorothyann Gibbs, M.D. Proc. Date: 02/08/01 Admit Date:  02/08/2001                             Operative Report  PREOPERATIVE DIAGNOSIS:  Osteoarthritis of the left knee.  SURGICAL PROCEDURE:  Left LCS total knee replacement, fully cemented.  POSTOPERATIVE DIAGNOSIS:  Osteoarthritis of the left knee.  SURGEON:  John L. Dorothyann Gibbs, M.D.  ASSISTANTJerolyn Shin. Tresa Res, M.D.  ANESTHESIA:  General.  PATHOLOGY:  Bone-on-bone medial compartment left knee.  DESCRIPTION OF PROCEDURE:  Under general anesthesia, the left leg is prepped with Duraprep and draped as a sterile field.  A proximal thigh tourniquet is used, the leg is wrapped out with the Esmarch, and the tourniquet is used at 350 mmHg.  A slightly medial midline incision is made.  Dissection is carried down through the joint capsule.  Medial capsullary tissue is peeled from the medial tibial metaphysis, and the patella is everted.  Multiple small vessels are cauterized.  The femur is sized at a standard, proximal tibial resection is carried out with the first tibial guide.  Using the first femoral guide, an intercondylar drill hole is placed.  Using the second guide, anterior and posterior flare of the distal femur are resected, but first it is necessary to balance the flexion gap, and this done by stripping the medial tibia around to the posterior medial using a Key elevator.  Once this is completed, the flexion gap is balanced at 15 mm, and the anterior and posterior flare of the distal femur are resected.  Intramedullary guide is then used, and the distal femoral cut is made with a 15 mm extension gap.  Recessing guide is then used. Lamina spreader is then inserted, and remnants of the menisci  and the cruiciates are resected.  Once this is completed, McKale and Homan are inserted.  The proximal tibia is sized to a 3.  Center peg hole is placed. Trial reduction of a size 3 tibia, 15 mm bearing, and standard femur reveals excellent fit of laminate and stability.  The patella is osteotomized, and three peg holes are placed for the standard patella.  Bony surfaces are then prepared with irrigation, and cement is mixed.  Femoral components are cemented in place.  Once this is hardened, excess is removed, tourniquet is let down at 45 minutes, multiple small vessels were cauterized, and the knee is then closed in layers with #1 surgidac, 0 and 2-0 Vicryl, and skin clips.  TOTAL OPERATIVE TIME:  Approximately one hour.  The patient tolerated the procedure well, and returned to recovery in good condition. Dictated by:   Carlisle Beers. Dorothyann Gibbs, M.D. Attending Physician:  Carolan Shiver Ii DD:  02/08/01 TD:  02/08/01 Job: [id number]

## 2010-10-04 NOTE — Op Note (Signed)
[place]. [facility]  Patient:    [name], [name]                         MRN: [id number] Proc. Date: 07/15/99 Adm. Date:  [id number] Attending:  Tressie Stalker D                           Operative Report  BRIEF HISTORY:  The patient is a 70 year old white female who has had back pain for almost 10 years.  It became intractable in March of 2000.  I treated her with medications, steroids, and physical therapy.  She has been treated by several different physicians.  She failed to improve.  She was worked up with a lumbar RI and lumbar model CT which demonstrated a mobile spondylolisthesis at L4-5 with 4-5 and L5-S1 degenerative disease spondylosis and spinal stenosis.  The patient therefore weighing the risks, benefits, alternatives to surgery decided to proceed with a lumbar decompression, stabilization, and fusion after she failed medical  management.  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  L4-5 and L5-S1 degenerative disk disease, spondylosis, spinal stenosis, lumbega, and lumbar radiculopathy.  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  L4-5 and L5-S1 degenerative disk disease, spondylosis, spinal stenosis, lumbega, and lumbar radiculopathy.  PROCEDURE:  L4-5 and L5-S1 posterior lumbar interbody fusion with insertion of bilateral 11 mm Synthes bone dials at L5-S1 and 13 mm bilateral Synthes bone dials at L4-5, posterior nonsegmental instrumentation with pedicle screws and rods from L4 to S1 bilaterally and a posterior lateral fusion with local morsilized autograft bone and allograft bone bilaterally.  SURGEON:  Cristi Loron, M.D.  ASSISTANT:  Alanson Aly. Roxan Hockey, M.D.  ANESTHESIA:  General endotracheal anesthesia.  ESTIMATED BLOOD LOSS:  700 cc.  SPECIMENS:  None.  DRAINS:  None.  COMPLICATIONS:  None.  DESCRIPTION OF PROCEDURE:  The patient was brought to the operating  room by the  anesthesia team.  General endotracheal anesthesia was induced.  The patient was  then turned to the prone position on the chest rolls.  Her lumbosacral region was then prepared with Betadine scrube and Betadine solution and sterile drapes were applied.  I then injected the area to be incised with Marcaine with epinephrine  solution and used the scalpel to make a vertical incision over the L4-5 and L5-S1 interspace.  I used electrocautery to dissect down to the thoracolumbar fascia. I divided the fascia bilaterally and performed a bilateral subperiosteal dissection stripping the paraspinous musculature from the spinous process of the lamina of L4, L5, and upper sacrum.  I inserted the McCullough retractor for exposure and obtained an introaperative radiograph to confirm my location.  I then used the Midas-Rex highspeed drill to perform the laminotomies and foraminotomies.  I began on the left side and I drilled off the caudal edge of the L5 lamina until I encountered insertion of ligamentum flavum.  I then drilled off the caudal edge of the left L5 lamina until I encountered insertion of the ligmanetum flavum.  I widened the laminotomy with the Kerrison punch removing the L4-5 and L5-S1 ligamentum flavum, decompressing the thecal sac, identified the left L5-S1 nerve root and performed a generous foraminotomy about it.  I saved some of the bone during the decompression for later morsilization and then used in the fusion.  then used the Kerrison punches to remove more of the medial aspect  of the facet  joint at L4-5 and L5-S1.  I identified the exiting L4 and L5 nerve roots in the  neuroforamen.  I then carefully retracted the L5 nerve root medially and thecal sac medially at L4-5 to gain access to the intervertebral disk.  I incised the disk  with a 15 blade scalpel, performed an aggressive diskectomy using the pituitary  forceps, scalpel, and Epstein curets.  I  then repeated this procedure at L5-S1. I retracted the thecal sac and the S1 nerve root medially, incised the L5-S1 intervertebral disk, and performed aggressive diskectomy with the pituitary forceps and Epstein curets.  I repeated this procedure identically.  I then repeated the laminotomy, foraminotomy, diskectomy on the right side as described for the left side, ie, at L4-5 and L5-S1.  At this point, good decompression of the thecal sac at bilateral L4, 5, and S1 nerve roots.  I then turned my attention to the arthrodesis.  I inserted the vertebral body spreader to the right L5-S1 interspace and extracted L5 and S1. I then prepared the vertebral interbodies on the left side with the disk space curets.  I then inserted an 11 mm Synthes bone dowel into the interspace under fluoroscopic guidance.  I then removed the vertebral body spreader and then from the right side and then filled the center of the diskectomy site with cancellous allograft bone and osteoblast matrix.  I then carefully inserted an 11 mm bone dowel into the right L5-S1 interspace under fluoroscopic guidance.  I then inspected the thecal sac and a bilateral L5 and S1 nerve root for any damage. There was none.  I then repeated this procedure at L4-5 as described above except that at this level, I inserted a 13 mm Synthes bone bowel bilaterally and once again I inspected the thecal sac and the bilateral L4 and L5 nerve roots for any damage and there was none.  At this point I completed the posterior lumbar interbody fusion and now I turned my attention to the posterior spinal instrumentation.  I exposed the bilateral L4, L5, transverse processes over the electrocautery and then under fluoroscopic guidance, I decordicated posterior to the bilateral L4, 5, and S1 pedicles with the Midas-Rex highspeed drill.  I then cannulated the pedicles with the pedicle probe.  I then tapped the pedicles and then removed the  tap and felt inside the tapped area to  make sure that the tapped area was well within the cortical bone and they all were. I did the procedure identical for each screw.  I inserted a 5.75 x 45 mm screws in  bilaterally at L4 and L5 and then 5.75 x 35 mm pedicle screws at S1.  All done under fluoroscopic guidance.  I then palpated about the bilateral L4, L5, and S1 pedicles and noted that the screw was well within the bone and that the nerves ere not in any way compressed.  I then used the Midas-Rex highspeed drill to decordicate the lateral masses at L4-5 and L5-S1.  I used pars in the remainder of the facet and then laid a mixture of local morsilized autograft bone against cancellous allograft bone and osteoblast matrix along the lateral aspects, ie, performed a posterolateral fusion.  I did this bilaterally.  I then connected the screws by using 8 cm rods and secured the rod to the screw with the caps.  I then incised the L4-5 interspinous ligament and removed the caudal edge of the L4 spinous process with the [employer]  rongeur to make room for placement of the cross connector.  I then appropriately secured the crossconnector connecting the left and right rods.  I then inspected the construct and it was very stable.  I then inspected the thecal sac and the bilateral L4, L5, and S1 nerve roots and the neural structures were well decompressed.  I achieved astringent hemostasis with bipolar electrocautery and then removed the McCall retractor and then reapproximated the patients thoracolumbar fascia with interrupted #1 Vicryl, the subcutaneous tissue with interrupted 2-0 Vicryl, the skin with Steri-Strips and  Benzoin.  The wound was then coated with bacitracin ointment.  A sterile dressing was applied.  The drapes were removed and the patient was subsequently extubated by the anesthesia team and transported to the postanesthesia care unit in stable condition.  All sponge,  needle, and instrument counts were correct at the end of this case. DD:  07/15/99 TD:  07/15/99 Job: [id number]

## 2010-12-24 ENCOUNTER — Ambulatory Visit (HOSPITAL_COMMUNITY)
Admission: RE | Admit: 2010-12-24 | Discharge: 2010-12-24 | Disposition: A | Discharge status: Home / Self Care | Payer: Medicare Other | Source: Ambulatory Visit | Attending: Internal Medicine | Admitting: Internal Medicine

## 2010-12-24 DIAGNOSIS — R0602 Shortness of breath: Secondary | ICD-10-CM | POA: Insufficient documentation

## 2011-01-13 ENCOUNTER — Other Ambulatory Visit: Payer: Self-pay | Admitting: Gastroenterology

## 2011-01-14 ENCOUNTER — Telehealth: Payer: Self-pay | Admitting: *Deleted

## 2011-01-14 MED ORDER — GLYCOPYRROLATE 2 MG PO TABS: 2.0000 mg | ORAL_TABLET | Freq: Two times a day (BID) | ORAL | Status: DC

## 2011-01-14 NOTE — Telephone Encounter (Signed)
Spoke to [employer] @ [facility]. D/C'ed rx sent for #10 refills (sent in error). Asked that patient be given #60 tablets and asked to have office visit for further refills. [place] verbalizes understanding.

## 2011-01-30 ENCOUNTER — Other Ambulatory Visit: Payer: Self-pay | Admitting: Gastroenterology

## 2011-02-04 ENCOUNTER — Other Ambulatory Visit: Payer: Self-pay | Admitting: Gastroenterology

## 2011-02-04 MED ORDER — DEXLANSOPRAZOLE 60 MG PO CPDR: 60.0000 mg | DELAYED_RELEASE_CAPSULE | Freq: Every day | ORAL | Status: DC

## 2011-02-04 NOTE — Telephone Encounter (Signed)
Error

## 2011-02-04 NOTE — Telephone Encounter (Signed)
Spoke with patient and informed her that she needs a office visit for any further refills. Pt scheduled appt for 02/27/11 at 11:15am and one refill was sent to her pharmacy until her appt.

## 2011-02-27 ENCOUNTER — Encounter: Payer: Self-pay | Admitting: Gastroenterology

## 2011-02-27 ENCOUNTER — Ambulatory Visit (INDEPENDENT_AMBULATORY_CARE_PROVIDER_SITE_OTHER): Payer: Medicare Other | Admitting: Gastroenterology

## 2011-02-27 VITALS — BP 130/68 | HR 68 | Ht 66.0 in | Wt 154.8 lb

## 2011-02-27 DIAGNOSIS — K219 Gastro-esophageal reflux disease without esophagitis: Secondary | ICD-10-CM

## 2011-02-27 DIAGNOSIS — R079 Chest pain, unspecified: Secondary | ICD-10-CM

## 2011-02-27 DIAGNOSIS — Z86010 Personal history of colonic polyps: Secondary | ICD-10-CM

## 2011-02-27 MED ORDER — GLYCOPYRROLATE 2 MG PO TABS: 2.0000 mg | ORAL_TABLET | Freq: Two times a day (BID) | ORAL | Status: DC

## 2011-02-27 MED ORDER — DEXLANSOPRAZOLE 60 MG PO CPDR: 60.0000 mg | DELAYED_RELEASE_CAPSULE | Freq: Every day | ORAL | Status: DC

## 2011-02-27 NOTE — Progress Notes (Signed)
History of Present Illness: This is a 70 year old female returns for followup of your bowel syndrome and GERD. She complains of intermittent sharp lower sternal and lower right-sided chest pain that lasts for several hours at a time. The symptoms began a few months ago and have not increased in severity or duration. They don't appear to have any relationship to meals or any particular activity. Her reflux and irritable bowel symptoms are well controlled. Denies weight loss, abdominal pain, constipation, diarrhea, change in stool caliber, melena, hematochezia, nausea, vomiting, dysphagia, reflux symptoms.  Current Medications, Allergies, Past Medical History, Past Surgical History, Family History and Social History were reviewed in [employer] record.  Physical Exam: General: Well developed , well nourished, no acute distress Head: Normocephalic and atraumatic Eyes:  sclerae anicteric, EOMI Ears: Normal auditory acuity Mouth: No deformity or lesions Lungs: Clear throughout to auscultation. Lower sternal and lower right sided chest wall tenderness Heart: Regular rate and rhythm; no murmurs, rubs or bruits Abdomen: Soft, non tender and non distended. No masses, hepatosplenomegaly or hernias noted. Normal Bowel sounds Musculoskeletal: Symmetrical with no gross deformities  Pulses:  Normal pulses noted Extremities: No clubbing, cyanosis, edema or deformities noted Neurological: Alert oriented x 4, grossly nonfocal Psychological:  Alert and cooperative. Normal mood and affect  Assessment and Recommendations:  1. Chest pain. This appears to be musculoskeletal. I advised her to use Advil 200 mg 1-2 every 6 hours. Requests blood work performed in August by her primary physician. If the symptoms persist she is advised to follow up with her primary physician.  2. GERD. Well controlled. Refill Dexilant 60 mg daily.  3. Irritable bowel syndrome. Symptoms well-controlled. Continue  glycopyrrolate 2 mg twice daily.  4. Personal history of adenomatous colon polyps. Surveillance colonoscopy recommended February 2016.

## 2011-02-27 NOTE — Patient Instructions (Addendum)
Your refill for Dexilant and glycopyrrolate has been sent to your pharmacy.  cc: Ralene Ok, MD

## 2011-03-05 ENCOUNTER — Other Ambulatory Visit: Payer: Self-pay | Admitting: Gastroenterology

## 2011-12-24 ENCOUNTER — Other Ambulatory Visit: Payer: Self-pay | Admitting: Internal Medicine

## 2011-12-24 DIAGNOSIS — N6029 Fibroadenosis of unspecified breast: Secondary | ICD-10-CM

## 2011-12-29 ENCOUNTER — Other Ambulatory Visit: Payer: Self-pay | Admitting: Gastroenterology

## 2011-12-30 ENCOUNTER — Other Ambulatory Visit: Payer: Self-pay | Admitting: Internal Medicine

## 2011-12-30 DIAGNOSIS — M810 Age-related osteoporosis without current pathological fracture: Secondary | ICD-10-CM

## 2012-01-02 ENCOUNTER — Ambulatory Visit
Admission: RE | Admit: 2012-01-02 | Discharge: 2012-01-02 | Disposition: A | Discharge status: Home / Self Care | Payer: Medicare Other | Source: Ambulatory Visit | Attending: Internal Medicine | Admitting: Internal Medicine

## 2012-01-02 DIAGNOSIS — N6029 Fibroadenosis of unspecified breast: Secondary | ICD-10-CM

## 2012-01-02 DIAGNOSIS — M810 Age-related osteoporosis without current pathological fracture: Secondary | ICD-10-CM

## 2012-03-10 ENCOUNTER — Other Ambulatory Visit: Payer: Self-pay | Admitting: Gastroenterology

## 2012-03-10 NOTE — Telephone Encounter (Signed)
NEEDS OFFICE VISIT FOR NAY FURTHER REFILLS!!

## 2012-03-31 ENCOUNTER — Other Ambulatory Visit: Payer: Self-pay

## 2012-03-31 MED ORDER — DEXLANSOPRAZOLE 60 MG PO CPDR: DELAYED_RELEASE_CAPSULE | ORAL | Status: DC

## 2012-05-31 ENCOUNTER — Telehealth: Payer: Self-pay | Admitting: Gastroenterology

## 2012-05-31 MED ORDER — GLYCOPYRROLATE 2 MG PO TABS: ORAL_TABLET | ORAL | Status: DC

## 2012-05-31 MED ORDER — DEXLANSOPRAZOLE 60 MG PO CPDR: DELAYED_RELEASE_CAPSULE | ORAL | Status: DC

## 2012-05-31 NOTE — Telephone Encounter (Signed)
Prescriptions for Dexilant and Robinul sent to patients pharmacy with only one refill. Pt told to keep appt for any further refills.

## 2012-06-07 ENCOUNTER — Encounter: Payer: Self-pay | Admitting: Gastroenterology

## 2012-06-07 ENCOUNTER — Ambulatory Visit (INDEPENDENT_AMBULATORY_CARE_PROVIDER_SITE_OTHER): Payer: Medicare Other | Admitting: Gastroenterology

## 2012-06-07 VITALS — BP 120/66 | HR 64 | Ht 65.5 in | Wt 158.4 lb

## 2012-06-07 DIAGNOSIS — K589 Irritable bowel syndrome without diarrhea: Secondary | ICD-10-CM

## 2012-06-07 DIAGNOSIS — K219 Gastro-esophageal reflux disease without esophagitis: Secondary | ICD-10-CM

## 2012-06-07 DIAGNOSIS — Z86010 Personal history of colonic polyps: Secondary | ICD-10-CM

## 2012-06-07 MED ORDER — GLYCOPYRROLATE 2 MG PO TABS: ORAL_TABLET | ORAL | Status: DC

## 2012-06-07 MED ORDER — DEXLANSOPRAZOLE 60 MG PO CPDR: DELAYED_RELEASE_CAPSULE | ORAL | Status: DC

## 2012-06-07 NOTE — Patient Instructions (Signed)
We have sent the following medications to your pharmacy for you to pick up at your convenience: Dexilant, and Robinul.  Thank you for choosing me and [facility].  Venita Lick. Pleas Koch., MD., Clementeen Graham

## 2012-06-07 NOTE — Progress Notes (Signed)
History of Present Illness: This is a 72 year old female with GERD and irritable bowel syndrome. She has occasional episodes of diarrhea alternating with normal stools. She has not recently noted constipation. Her symptoms remain under very good control on dexlansoprazole and glycopyrrolate.  She occasionally noted epigastric discomfort that lasts a few hours that it is improved but not probably relieved with antacids. Denies weight loss, constipation, change in stool caliber, melena, hematochezia, nausea, vomiting, dysphagia, chest pain.  Current Medications, Allergies, Past Medical History, Past Surgical History, Family History and Social History were reviewed in [employer] record.  Physical Exam: General: Well developed , well nourished, no acute distress Head: Normocephalic and atraumatic Eyes:  sclerae anicteric, EOMI Ears: Normal auditory acuity Mouth: No deformity or lesions Lungs: Clear throughout to auscultation Heart: Regular rate and rhythm; no murmurs, rubs or bruits Abdomen: Soft, non tender and non distended. No masses, hepatosplenomegaly or hernias noted. Normal Bowel sounds Musculoskeletal: Symmetrical with no gross deformities  Pulses:  Normal pulses noted Extremities: No clubbing, cyanosis, edema or deformities noted Neurological: Alert oriented x 4, grossly nonfocal Psychological:  Alert and cooperative. Normal mood and affect  Assessment and Recommendations:  1. GERD. Well controlled. Refill Dexilant 60 mg daily. TUMS prn.  2. Irritable bowel syndrome. Symptoms well-controlled. Continue glycopyrrolate 2 mg twice daily.   3. Personal history of adenomatous colon polyps. Surveillance colonoscopy recommended February 2016.

## 2012-06-30 ENCOUNTER — Other Ambulatory Visit: Payer: Self-pay | Admitting: Gastroenterology

## 2012-11-22 ENCOUNTER — Ambulatory Visit: Payer: Self-pay | Admitting: Urology

## 2012-12-01 ENCOUNTER — Other Ambulatory Visit: Payer: Self-pay

## 2012-12-01 DIAGNOSIS — Z1231 Encounter for screening mammogram for malignant neoplasm of breast: Secondary | ICD-10-CM

## 2013-01-03 ENCOUNTER — Ambulatory Visit
Admission: RE | Admit: 2013-01-03 | Discharge: 2013-01-03 | Disposition: A | Discharge status: Home / Self Care | Payer: Medicare Other | Source: Ambulatory Visit

## 2013-01-03 DIAGNOSIS — Z1231 Encounter for screening mammogram for malignant neoplasm of breast: Secondary | ICD-10-CM

## 2013-06-20 ENCOUNTER — Other Ambulatory Visit: Payer: Self-pay | Admitting: Gastroenterology

## 2013-06-22 ENCOUNTER — Telehealth: Payer: Self-pay | Admitting: Gastroenterology

## 2013-06-22 MED ORDER — DEXLANSOPRAZOLE 60 MG PO CPDR: DELAYED_RELEASE_CAPSULE | ORAL | Status: DC

## 2013-06-22 NOTE — Telephone Encounter (Signed)
Patient states she lives too far away to come pick up samples but she would really like a prescription sent to her pharmacy. [facility] and patient notified she needs a yearly follow up appt for further refills. Patient states she will call back and make an appt when she gets back from out of town.

## 2013-07-20 ENCOUNTER — Encounter: Payer: Self-pay | Admitting: Gastroenterology

## 2013-07-20 ENCOUNTER — Ambulatory Visit (INDEPENDENT_AMBULATORY_CARE_PROVIDER_SITE_OTHER): Payer: Medicare Other | Admitting: Gastroenterology

## 2013-07-20 VITALS — BP 120/68 | HR 88 | Ht 65.5 in | Wt 165.0 lb

## 2013-07-20 DIAGNOSIS — Z86010 Personal history of colonic polyps: Secondary | ICD-10-CM

## 2013-07-20 DIAGNOSIS — K589 Irritable bowel syndrome without diarrhea: Secondary | ICD-10-CM

## 2013-07-20 DIAGNOSIS — K219 Gastro-esophageal reflux disease without esophagitis: Secondary | ICD-10-CM

## 2013-07-20 MED ORDER — DEXLANSOPRAZOLE 60 MG PO CPDR: DELAYED_RELEASE_CAPSULE | ORAL | Status: DC

## 2013-07-20 MED ORDER — GLYCOPYRROLATE 2 MG PO TABS: ORAL_TABLET | ORAL | Status: DC

## 2013-07-20 NOTE — Patient Instructions (Signed)
We have sent the following medications to your pharmacy for you to pick up at your convenience: Robinul and [place].   Thank you for choosing me and [facility].  Pricilla Riffle. Dagoberto Ligas., MD., Marval Regal

## 2013-07-20 NOTE — Progress Notes (Signed)
    History of Present Illness: This is a 73 year old female with GERD and IBS. Her symptoms are under very good control on her current regimen. She has occasional episodes of alternating diarrhea and constipation.  Current Medications, Allergies, Past Medical History, Past Surgical History, Family History and Social History were reviewed in [employer] record.  Physical Exam: General: Well developed , well nourished, no acute distress Head: Normocephalic and atraumatic Eyes:  sclerae anicteric, EOMI Ears: Normal auditory acuity Mouth: No deformity or lesions Lungs: Clear throughout to auscultation Heart: Regular rate and rhythm; no murmurs, rubs or bruits Abdomen: Soft, non tender and non distended. No masses, hepatosplenomegaly or hernias noted. Normal Bowel sounds Musculoskeletal: Symmetrical with no gross deformities  Pulses:  Normal pulses noted Extremities: No clubbing, cyanosis, edema or deformities noted Neurological: Alert oriented x 4, grossly nonfocal Psychological:  Alert and cooperative. Normal mood and affect  Assessment and Recommendations:  1. GERD. Well controlled. Refill Dexilant 60 mg daily. Continue standard antireflux measures. TUMS prn.   2. Irritable bowel syndrome. Symptoms well-controlled. Continue glycopyrrolate 2 mg twice daily. May use MiraLax daily as needed for constipation.  3. Personal history of adenomatous colon polyps. Surveillance colonoscopy recommended February 2016.

## 2013-12-13 ENCOUNTER — Other Ambulatory Visit: Payer: Self-pay

## 2013-12-13 DIAGNOSIS — Z1231 Encounter for screening mammogram for malignant neoplasm of breast: Secondary | ICD-10-CM

## 2014-01-09 ENCOUNTER — Other Ambulatory Visit: Payer: Self-pay | Admitting: Internal Medicine

## 2014-01-09 DIAGNOSIS — M810 Age-related osteoporosis without current pathological fracture: Secondary | ICD-10-CM

## 2014-01-11 ENCOUNTER — Encounter (INDEPENDENT_AMBULATORY_CARE_PROVIDER_SITE_OTHER): Payer: Self-pay

## 2014-01-11 ENCOUNTER — Ambulatory Visit
Admission: RE | Admit: 2014-01-11 | Discharge: 2014-01-11 | Disposition: A | Discharge status: Home / Self Care | Payer: Medicare Other | Source: Ambulatory Visit

## 2014-01-11 DIAGNOSIS — Z1231 Encounter for screening mammogram for malignant neoplasm of breast: Secondary | ICD-10-CM

## 2014-01-16 ENCOUNTER — Ambulatory Visit
Admission: RE | Admit: 2014-01-16 | Discharge: 2014-01-16 | Disposition: A | Discharge status: Home / Self Care | Payer: Medicare Other | Source: Ambulatory Visit | Attending: Internal Medicine | Admitting: Internal Medicine

## 2014-01-16 DIAGNOSIS — M810 Age-related osteoporosis without current pathological fracture: Secondary | ICD-10-CM

## 2014-01-19 ENCOUNTER — Encounter: Payer: Self-pay | Admitting: Gastroenterology

## 2014-02-24 ENCOUNTER — Other Ambulatory Visit: Payer: Self-pay | Admitting: Internal Medicine

## 2014-02-24 ENCOUNTER — Ambulatory Visit
Admission: RE | Admit: 2014-02-24 | Discharge: 2014-02-24 | Disposition: A | Discharge status: Home / Self Care | Payer: Medicare Other | Source: Ambulatory Visit | Attending: Internal Medicine | Admitting: Internal Medicine

## 2014-02-24 DIAGNOSIS — R05 Cough: Secondary | ICD-10-CM

## 2014-02-24 DIAGNOSIS — R059 Cough, unspecified: Secondary | ICD-10-CM

## 2014-02-24 DIAGNOSIS — R0789 Other chest pain: Secondary | ICD-10-CM

## 2014-02-24 DIAGNOSIS — R079 Chest pain, unspecified: Secondary | ICD-10-CM

## 2014-03-02 ENCOUNTER — Ambulatory Visit
Admission: RE | Admit: 2014-03-02 | Discharge: 2014-03-02 | Disposition: A | Discharge status: Home / Self Care | Payer: Medicare Other | Source: Ambulatory Visit | Attending: Internal Medicine | Admitting: Internal Medicine

## 2014-03-02 DIAGNOSIS — R05 Cough: Secondary | ICD-10-CM

## 2014-03-02 DIAGNOSIS — R059 Cough, unspecified: Secondary | ICD-10-CM

## 2014-03-02 DIAGNOSIS — R079 Chest pain, unspecified: Secondary | ICD-10-CM

## 2014-03-02 MED ORDER — IOHEXOL 300 MG/ML  SOLN
75.0000 mL | Freq: Once | INTRAMUSCULAR | Status: AC | PRN
Start: 2014-03-02 — End: 2014-03-02
  Administered 2014-03-02: 75 mL via INTRAVENOUS

## 2014-03-09 ENCOUNTER — Encounter (INDEPENDENT_AMBULATORY_CARE_PROVIDER_SITE_OTHER): Payer: Self-pay

## 2014-03-09 ENCOUNTER — Ambulatory Visit (INDEPENDENT_AMBULATORY_CARE_PROVIDER_SITE_OTHER): Payer: Medicare Other | Admitting: Internal Medicine

## 2014-03-09 ENCOUNTER — Encounter: Payer: Self-pay | Admitting: Internal Medicine

## 2014-03-09 VITALS — BP 104/60 | HR 58 | Temp 97.7°F | Ht 65.0 in | Wt 163.0 lb

## 2014-03-09 DIAGNOSIS — M549 Dorsalgia, unspecified: Secondary | ICD-10-CM

## 2014-03-09 DIAGNOSIS — R918 Other nonspecific abnormal finding of lung field: Secondary | ICD-10-CM

## 2014-03-09 NOTE — Patient Instructions (Addendum)
zostrix cream apply four times a day to area of back pain  Try tramadol up to 2 every 4 hours for back pain   Come to outpatient registration at [facility] (behind the ER) at 715 amTuesday 03/14/14 with nothing to eat or drink after midnight Monday .for Bronchoscopy

## 2014-03-11 DIAGNOSIS — M549 Dorsalgia, unspecified: Secondary | ICD-10-CM | POA: Insufficient documentation

## 2014-03-11 DIAGNOSIS — R918 Other nonspecific abnormal finding of lung field: Secondary | ICD-10-CM | POA: Insufficient documentation

## 2014-03-11 NOTE — Assessment & Plan Note (Signed)
Need to expedite dx as needs RT asap as risk of Spinal cord compression   For now rx pain with tramadol prn and trial of Zostrix

## 2014-03-11 NOTE — Assessment & Plan Note (Signed)
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

## 2014-03-11 NOTE — Progress Notes (Signed)
Subjective:     Patient ID: [name], female   DOB: 1940-09-07  MRN: [id number]  HPI  35 yowf quit smoking 05/2013 referred by Dr Mellody Drown for abn CT c/w bronchogenic ca   03/10/2014 1st [facility] office visit/ Johnte Portnoy   Chief Complaint  Patient presents with  . Pulmonary Consult    Ref. by Dr. Lindajo Royal pain in side and around back,cough-dry x 3 wks.,sob with exertion,no wheezing,no fcs  Onset was relatively abrupt x 3 weeks with greatest concern mid spine which correlates with probable met to T8.  Sob only with exertion, not with adls. Has not tried tramadol for back pain though does have it.  Pain worse upright, no neurologic symptoms.   No obvious day to day or daytime variabilty or assoc  Hemoptysis, chest tightness, subjective wheeze overt sinus or hb symptoms. No unusual exp hx or h/o childhood pna/ asthma or knowledge of premature birth.  Sleeping ok without nocturnal  or early am exacerbation  of respiratory  c/o's or need for noct saba. Also denies any obvious fluctuation of symptoms with weather or environmental changes or other aggravating or alleviating factors except as outlined above   Current Medications, Allergies, Complete Past Medical History, Past Surgical History, Family History, and Social History were reviewed in [employer] record.  ROS  The following are not active complaints unless bolded sore throat, dysphagia, dental problems, itching, sneezing,  nasal congestion or excess/ purulent secretions, ear ache,   fever, chills, sweats, unintended wt loss, pleuritic or exertional cp, hemoptysis,  orthopnea pnd or leg swelling, presyncope, palpitations, heartburn, abdominal pain, anorexia, nausea, vomiting, diarrhea  or change in bowel or urinary habits, change in stools or urine, dysuria,hematuria,  rash, arthralgias, visual complaints, headache, numbness weakness or ataxia or problems with walking or coordination,  change in mood/affect or  memory.       Review of Systems     Objective:   Physical Exam amb wf nad  Wt Readings from Last 3 Encounters:  03/09/14 163 lb (73.936 kg)  07/20/13 165 lb (74.844 kg)  06/07/12 158 lb 6.4 oz (71.85 kg)      HEENT: nl dentition, turbinates, and orophanx. Nl external ear canals without cough reflex   NECK :  without JVD/Nodes/TM/ nl carotid upstrokes bilaterally   LUNGS: no acc muscle use, clear to A and P bilaterally without cough on insp or exp maneuvers   CV:  RRR  no s3 or murmur or increase in P2, no edema   ABD:  soft and nontender with nl excursion in the supine position. No bruits or organomegaly, bowel sounds nl  MS:  warm without deformities, calf tenderness, cyanosis or clubbing  SKIN: warm and dry without lesions    NEURO:  alert, approp, no deficits     CT chest with contrast  1. Stage IV primary bronchogenic carcinoma as evidenced by bulky  mediastinal and right hilar adenopathy, right lower lobe nodules,  right pleural/extrapleural nodularity, new hepatic lesions and new  T8 lytic lesion.  2. Pathologic T8 compression fracture.  3. Bulky right hilar nodal mass results in narrowing of the bronchus  intermedius.  4. Right adrenal nodularity is unchanged from 09/02/2008, indicating  benign adrenal adenomas.  5. Three-vessel coronary artery calcification     Assessment:

## 2014-03-13 ENCOUNTER — Other Ambulatory Visit: Payer: Self-pay | Admitting: *Deleted

## 2014-03-13 ENCOUNTER — Encounter: Payer: Self-pay | Admitting: *Deleted

## 2014-03-13 ENCOUNTER — Telehealth: Payer: Self-pay | Admitting: *Deleted

## 2014-03-13 DIAGNOSIS — R918 Other nonspecific abnormal finding of lung field: Secondary | ICD-10-CM

## 2014-03-13 NOTE — Progress Notes (Signed)
Called and spoke with patient.  She is aware of her appt with Dr. Julien Nordmann on 03/17/14 at 9:30 labs and 9:45 Dr. Julien Nordmann.

## 2014-03-13 NOTE — Telephone Encounter (Signed)
Called with appt to see Dr. Julien Nordmann on 03/17/14 at 9:45 labs at 9:30.  She verbalized understanding of appt time and place.

## 2014-03-14 ENCOUNTER — Encounter (HOSPITAL_COMMUNITY): Payer: Self-pay

## 2014-03-14 ENCOUNTER — Ambulatory Visit (HOSPITAL_COMMUNITY)
Admission: RE | Admit: 2014-03-14 | Discharge: 2014-03-14 | Disposition: A | Discharge status: Home / Self Care | Payer: Medicare Other | Source: Ambulatory Visit | Attending: Internal Medicine | Admitting: Internal Medicine

## 2014-03-14 ENCOUNTER — Telehealth: Payer: Self-pay | Admitting: Internal Medicine

## 2014-03-14 ENCOUNTER — Encounter (HOSPITAL_COMMUNITY)
Admission: RE | Disposition: A | Discharge status: Home / Self Care | Payer: Self-pay | Source: Ambulatory Visit | Attending: Internal Medicine

## 2014-03-14 ENCOUNTER — Encounter (INDEPENDENT_AMBULATORY_CARE_PROVIDER_SITE_OTHER): Payer: Self-pay

## 2014-03-14 ENCOUNTER — Other Ambulatory Visit: Payer: Self-pay | Admitting: Internal Medicine

## 2014-03-14 DIAGNOSIS — R918 Other nonspecific abnormal finding of lung field: Secondary | ICD-10-CM

## 2014-03-14 DIAGNOSIS — K589 Irritable bowel syndrome without diarrhea: Secondary | ICD-10-CM | POA: Diagnosis not present

## 2014-03-14 DIAGNOSIS — Z87891 Personal history of nicotine dependence: Secondary | ICD-10-CM | POA: Insufficient documentation

## 2014-03-14 DIAGNOSIS — C3490 Malignant neoplasm of unspecified part of unspecified bronchus or lung: Secondary | ICD-10-CM | POA: Diagnosis present

## 2014-03-14 DIAGNOSIS — I2510 Atherosclerotic heart disease of native coronary artery without angina pectoris: Secondary | ICD-10-CM | POA: Insufficient documentation

## 2014-03-14 DIAGNOSIS — D3501 Benign neoplasm of right adrenal gland: Secondary | ICD-10-CM | POA: Insufficient documentation

## 2014-03-14 DIAGNOSIS — Z86010 Personal history of colonic polyps: Secondary | ICD-10-CM | POA: Insufficient documentation

## 2014-03-14 DIAGNOSIS — K219 Gastro-esophageal reflux disease without esophagitis: Secondary | ICD-10-CM | POA: Diagnosis not present

## 2014-03-14 DIAGNOSIS — M8448XA Pathological fracture, other site, initial encounter for fracture: Secondary | ICD-10-CM | POA: Diagnosis not present

## 2014-03-14 HISTORY — PX: VIDEO BRONCHOSCOPY: SHX5072

## 2014-03-14 SURGERY — VIDEO BRONCHOSCOPY WITHOUT FLUORO
Anesthesia: Moderate Sedation | Laterality: Bilateral

## 2014-03-14 MED ORDER — MIDAZOLAM HCL 5 MG/ML IJ SOLN: 1.0000 mg | Freq: Once | INTRAMUSCULAR | Status: DC

## 2014-03-14 MED ORDER — LIDOCAINE HCL (PF) 1 % IJ SOLN
INTRAMUSCULAR | Status: DC | PRN
  Administered 2014-03-14: 6 mL

## 2014-03-14 MED ORDER — SODIUM CHLORIDE 0.9 % IV SOLN
INTRAVENOUS | Status: DC
  Administered 2014-03-14: 08:00:00 via INTRAVENOUS

## 2014-03-14 MED ORDER — MIDAZOLAM HCL 10 MG/2ML IJ SOLN
INTRAMUSCULAR | Status: AC
  Filled 2014-03-14: qty 4

## 2014-03-14 MED ORDER — MEPERIDINE HCL 100 MG/ML IJ SOLN
INTRAMUSCULAR | Status: AC
  Filled 2014-03-14: qty 2

## 2014-03-14 MED ORDER — MIDAZOLAM HCL 10 MG/2ML IJ SOLN
INTRAMUSCULAR | Status: DC | PRN
  Administered 2014-03-14 (×2): 2.5 mg via INTRAVENOUS

## 2014-03-14 MED ORDER — LIDOCAINE HCL 2 % EX GEL
1.0000 | Freq: Once | CUTANEOUS | Status: DC
Start: 2014-03-14 — End: 2014-03-14

## 2014-03-14 MED ORDER — PHENYLEPHRINE HCL 0.25 % NA SOLN
NASAL | Status: DC | PRN
  Administered 2014-03-14: 2 via NASAL

## 2014-03-14 MED ORDER — LIDOCAINE HCL 2 % EX GEL
CUTANEOUS | Status: DC | PRN
  Administered 2014-03-14: 1

## 2014-03-14 MED ORDER — MEPERIDINE HCL 25 MG/ML IJ SOLN
INTRAMUSCULAR | Status: DC | PRN
  Administered 2014-03-14: 50 mg via INTRAVENOUS

## 2014-03-14 MED ORDER — PHENYLEPHRINE HCL 0.25 % NA SOLN: 1.0000 | Freq: Four times a day (QID) | NASAL | Status: DC | PRN

## 2014-03-14 MED ORDER — OXYCODONE HCL 5 MG PO CAPS: ORAL_CAPSULE | ORAL | Status: DC

## 2014-03-14 MED ORDER — MEPERIDINE HCL 100 MG/ML IJ SOLN: 100.0000 mg | Freq: Once | INTRAMUSCULAR | Status: DC

## 2014-03-14 NOTE — Telephone Encounter (Signed)
Spoke with Magda Paganini- she placed the Rx at the front this morning and left a message for Gilbert regarding this; I have contacted [name] back who was not available at the time of my call-Mr. Weiskopf is aware that Rx is at front for him or the daughter to pick up tomorrow morning. Nothing more needed at this time.

## 2014-03-14 NOTE — Progress Notes (Signed)
Video bronchoscopy procedure performed. Wang needle biopsy intervention performed. Washing intervention performed. Transbronchial biopsy intervention performed.

## 2014-03-14 NOTE — Discharge Instructions (Signed)
Flexible Bronchoscopy, Care After These instructions give you information on caring for yourself after your procedure. Your doctor may also give you more specific instructions. Call your doctor if you have any problems or questions after your procedure. HOME CARE  Do not eat or drink anything for 2 hours after your procedure. YOU MAY BEGIN TO EAT AND DRINK AT 1030. If you try to eat or drink before the medicine wears off, food or drink could go into your lungs. You could also burn yourself.  After 2 hours have passed and when you can cough and gag normally, you may eat soft food and drink liquids slowly.  The day after the test, you may eat your normal diet.  You may do your normal activities.  Keep all doctor visits. GET HELP RIGHT AWAY IF:  You get more and more short of breath.  You get light-headed.  You feel like you are going to pass out (faint).  You have chest pain.  You have new problems that worry you.  You cough up more than a little blood.  You cough up more blood than before. MAKE SURE YOU:  Understand these instructions.  Will watch your condition.  Will get help right away if you are not doing well or get worse. Document Released: 03/02/2009 Document Revised: 05/10/2013 Document Reviewed: 01/07/2013 [facility] Patient Information 2015 [place], [place]. This information is not intended to replace advice given to you by your health care provider. Make sure you discuss any questions you have with your health care provider.

## 2014-03-14 NOTE — Op Note (Signed)
Bronchoscopy Procedure Note  Date of Operation: 03/14/2014   Pre-op Diagnosis: met lung ca  Post-op Diagnosis: same  Surgeon: Christinia Gully  Anesthesia: Monitored Local Anesthesia with Sedation  Operation: Video Flexible fiberoptic bronchoscopy, diagnostic   Findings: extensive cobblestoning BI and RML   Specimen: washings/ wang bx BI, endobronchial bx BI  Estimated Blood Loss: minimal, controlled with epi topically  Complications: none  Indications and History: See updated H and P same date. The risks, benefits, complications, treatment options and expected outcomes were discussed with the patient.  The possibilities of reaction to medication, pulmonary aspiration, perforation of a viscus, bleeding, failure to diagnose a condition and creating a complication requiring transfusion or operation were discussed with the patient who freely signed the consent.    Description of Procedure: The patient was re-examined in the bronchoscopy suite and the site of surgery properly noted/marked.  The patient was identified  and the procedure verified as Flexible Fiberoptic Bronchoscopy.  A Time Out was held and the above information confirmed.   After the induction of topical nasopharyngeal anesthesia, the patient was positioned  and the bronchoscope was passed through the R naris. The vocal cords were visualized and  1% buffered lidocaine 5 ml was topically placed onto the cords. The cords were nl . The scope was then passed into the trachea.  1% buffered lidocaine given topically. Airways inspected bilaterally to the subsegmental level with the following findings:  Carina was widened as was air divider between RUL and BI with polypoid lumpy bumpy cobblestoning most sever BI and RML with vascular but smooth mucosa    Intervientions 1) Bronchial washings BI 2) Wang Bx x one BI, bloody return 3) Endobronchial bx x 3     The Patient was taken to the Endoscopy Recovery area in satisfactory  condition.  Attestation: I performed the procedure.  Christinia Gully, MD Pulmonary and [place] [phone] After 5:30 PM or weekends, call [phone]

## 2014-03-14 NOTE — Telephone Encounter (Signed)
[id number] x1 

## 2014-03-14 NOTE — H&P (Signed)
Patient ID: [name], female   DOB: 01-Apr-1941  MRN: [id number]   HPI   38 yowf quit smoking 05/2013 referred by Dr Mellody Drown for abn CT c/w bronchogenic ca     03/10/2014 1st [facility] office visit/ [name]    Chief Complaint   Patient presents with   .  Pulmonary Consult       Ref. by Dr. Lindajo [name] pain in side and around back,cough-dry x 3 wks.,sob with exertion,no wheezing,no fcs    Onset was relatively abrupt x 3 weeks with greatest concern mid spine which correlates with probable met to T8.  Sob only with exertion, not with adls. Has not tried tramadol for back pain though does have it.   Pain worse upright, no neurologic symptoms.    No obvious day to day or daytime variabilty or assoc  Hemoptysis, chest tightness, subjective wheeze overt sinus or hb symptoms. No unusual exp hx or h/o childhood pna/ asthma or knowledge of premature birth.   Sleeping ok without nocturnal  or early am exacerbation  of respiratory  c/o's or need for noct saba. Also denies any obvious fluctuation of symptoms with weather or environmental changes or other aggravating or alleviating factors except as outlined above    Current Medications, Allergies, Complete Past Medical History, Past Surgical History, Family History, and Social History were reviewed in [employer] record.   ROS  The following are not active complaints unless bolded sore throat, dysphagia, dental problems, itching, sneezing,  nasal congestion or excess/ purulent secretions, ear ache,   fever, chills, sweats, unintended wt loss, pleuritic or exertional cp, hemoptysis,  orthopnea pnd or leg swelling, presyncope, palpitations, heartburn, abdominal pain, anorexia, nausea, vomiting, diarrhea  or change in bowel or urinary habits, change in stools or urine, dysuria,hematuria,  rash, arthralgias, visual complaints, headache, numbness weakness or ataxia or problems with walking or coordination,  change in  mood/affect or memory.        Review of Systems       Objective:     Physical Exam amb wf nad    Wt Readings from Last 3 Encounters:   03/09/14  163 lb (73.936 kg)   07/20/13  165 lb (74.844 kg)   06/07/12  158 lb 6.4 oz (71.85 kg)         HEENT: nl dentition, turbinates, and orophanx. Nl external ear canals without cough reflex     NECK :  without JVD/Nodes/TM/ nl carotid upstrokes bilaterally     LUNGS: no acc muscle use, clear to A and P bilaterally without cough on insp or exp maneuvers     CV:  RRR  no s3 or murmur or increase in P2, no edema    ABD:  soft and nontender with nl excursion in the supine position. No bruits or organomegaly, bowel sounds nl   MS:  warm without deformities, calf tenderness, cyanosis or clubbing   SKIN: warm and dry without lesions     NEURO:  alert, approp, no deficits       CT chest with contrast   1. Stage IV primary bronchogenic carcinoma as evidenced by bulky   mediastinal and right hilar adenopathy, right lower lobe nodules,   right pleural/extrapleural nodularity, new hepatic lesions and new   T8 lytic lesion.   2. Pathologic T8 compression fracture.   3. Bulky right hilar nodal mass results in narrowing of the bronchus   intermedius.   4. Right adrenal nodularity is unchanged from  09/02/2008, indicating   benign adrenal adenomas.   5. Three-vessel coronary artery calcification       Assessment:                           Lung mass -     Most likely this is Stage IV lung ca or extensive small cell with best option for tissue dx FOB   Discussed in detail all the  indications, usual  risks and alternatives  relative to the benefits with patient who agrees to proceed with bronchoscopy with biopsy.           Acute mid back pain secondary to lytic lesion T8      Need to expedite dx as needs RT asap as risk of Spinal cord compression though not a problem radiographically or clinically at present     For now rx pain with tramadol prn and trial of Zostrix    03/14/2014 day of fob/ no excess/ purulent or bloody mucus/ cp min improved otherwise no change from above   Mandy Gully, MD Pulmonary and [place] [phone] After 5:30 PM or weekends, call [phone]

## 2014-03-14 NOTE — Telephone Encounter (Signed)
Spoke with Stephanie-patient's daughter-states MW told her this morning he was calling a stronger RX to the pharmacy for patient after her Bronch this morning. I do see a RX on file stating Rx for Oxycodone 5 mg was printed; however [name] states they were never given a RX.   MW please advise if you are okay with [place] re-printing Rx for Oxycodone 5 mg and patients daughter is aware the Rx will need to be picked up and taken to the pharmacy. Thanks.

## 2014-03-14 NOTE — Telephone Encounter (Signed)
Yes, I gave a copy to [place]

## 2014-03-15 ENCOUNTER — Encounter: Payer: Self-pay | Admitting: Internal Medicine

## 2014-03-16 ENCOUNTER — Encounter (HOSPITAL_COMMUNITY): Payer: Self-pay | Admitting: Internal Medicine

## 2014-03-17 ENCOUNTER — Telehealth: Payer: Self-pay | Admitting: *Deleted

## 2014-03-17 ENCOUNTER — Other Ambulatory Visit (HOSPITAL_BASED_OUTPATIENT_CLINIC_OR_DEPARTMENT_OTHER): Payer: Medicare Other

## 2014-03-17 ENCOUNTER — Encounter: Payer: Self-pay | Admitting: *Deleted

## 2014-03-17 ENCOUNTER — Telehealth: Payer: Self-pay | Admitting: Internal Medicine

## 2014-03-17 ENCOUNTER — Ambulatory Visit (HOSPITAL_BASED_OUTPATIENT_CLINIC_OR_DEPARTMENT_OTHER): Payer: Medicare Other | Admitting: Internal Medicine

## 2014-03-17 VITALS — BP 115/55 | HR 68 | Temp 97.9°F | Resp 18 | Ht 65.0 in | Wt 155.5 lb

## 2014-03-17 DIAGNOSIS — C3490 Malignant neoplasm of unspecified part of unspecified bronchus or lung: Secondary | ICD-10-CM | POA: Insufficient documentation

## 2014-03-17 DIAGNOSIS — C342 Malignant neoplasm of middle lobe, bronchus or lung: Secondary | ICD-10-CM

## 2014-03-17 DIAGNOSIS — R918 Other nonspecific abnormal finding of lung field: Secondary | ICD-10-CM

## 2014-03-17 DIAGNOSIS — C3491 Malignant neoplasm of unspecified part of right bronchus or lung: Secondary | ICD-10-CM

## 2014-03-17 DIAGNOSIS — Z23 Encounter for immunization: Secondary | ICD-10-CM

## 2014-03-17 LAB — COMPREHENSIVE METABOLIC PANEL (CC13)
ALBUMIN: 3.9 g/dL (ref 3.5–5.0)
ALT: 20 U/L (ref 0–55)
ANION GAP: 10 meq/L (ref 3–11)
AST: 31 U/L (ref 5–34)
Alkaline Phosphatase: 110 U/L (ref 40–150)
BILIRUBIN TOTAL: 0.8 mg/dL (ref 0.20–1.20)
BUN: 12.3 mg/dL (ref 7.0–26.0)
CALCIUM: 10.2 mg/dL (ref 8.4–10.4)
CHLORIDE: 87 meq/L — AB (ref 98–109)
CO2: 28 mEq/L (ref 22–29)
Creatinine: 1.2 mg/dL — ABNORMAL HIGH (ref 0.6–1.1)
GLUCOSE: 111 mg/dL (ref 70–140)
POTASSIUM: 3.7 meq/L (ref 3.5–5.1)
Sodium: 124 mEq/L — ABNORMAL LOW (ref 136–145)
TOTAL PROTEIN: 7.3 g/dL (ref 6.4–8.3)

## 2014-03-17 LAB — CBC WITH DIFFERENTIAL/PLATELET
BASO%: 0.8 % (ref 0.0–2.0)
Basophils Absolute: 0.1 10*3/uL (ref 0.0–0.1)
EOS%: 1.2 % (ref 0.0–7.0)
Eosinophils Absolute: 0.1 10*3/uL (ref 0.0–0.5)
HCT: 41.4 % (ref 34.8–46.6)
HGB: 14.1 g/dL (ref 11.6–15.9)
LYMPH#: 1.7 10*3/uL (ref 0.9–3.3)
LYMPH%: 22 % (ref 14.0–49.7)
MCH: 28.2 pg (ref 25.1–34.0)
MCHC: 34 g/dL (ref 31.5–36.0)
MCV: 82.9 fL (ref 79.5–101.0)
MONO#: 0.9 10*3/uL (ref 0.1–0.9)
MONO%: 11.4 % (ref 0.0–14.0)
NEUT#: 5.1 10*3/uL (ref 1.5–6.5)
NEUT%: 64.6 % (ref 38.4–76.8)
PLATELETS: 247 10*3/uL (ref 145–400)
RBC: 4.99 10*6/uL (ref 3.70–5.45)
RDW: 14 % (ref 11.2–14.5)
WBC: 7.9 10*3/uL (ref 3.9–10.3)

## 2014-03-17 MED ORDER — INFLUENZA VAC SPLIT QUAD 0.5 ML IM SUSY
0.5000 mL | PREFILLED_SYRINGE | Freq: Once | INTRAMUSCULAR | Status: AC
  Administered 2014-03-17: 0.5 mL via INTRAMUSCULAR
  Filled 2014-03-17: qty 0.5

## 2014-03-17 MED ORDER — PROCHLORPERAZINE MALEATE 10 MG PO TABS: 10.0000 mg | ORAL_TABLET | Freq: Four times a day (QID) | ORAL | Status: DC | PRN

## 2014-03-17 NOTE — Telephone Encounter (Signed)
Per staff message and POF I have scheduled appts. Advised scheduler of appts. JMW  

## 2014-03-17 NOTE — Telephone Encounter (Signed)
Gave avs & cal for Nov. Sent mess to sch tx. Pt to pick sch up on 03/20/14 appt.

## 2014-03-17 NOTE — Progress Notes (Signed)
Spoke with patient today at [name].  This was a first time visit for patient.  I sat with her and Dr. Julien Nordmann while he explained lung cancer diagnoses.  I gave and explained information from the [place] lung cancer booklet.  Patient was slightly tearful.  She has good support with husband and daughter at her side.

## 2014-03-18 NOTE — Progress Notes (Signed)
[name] [name] [phone]   [phone]  CONSULT NOTE  REFERRING PHYSICIAN: Dr. Christinia Gully  REASON FOR CONSULTATION:  73 years old white female recently diagnosed with lung cancer.  HPI [name] [name] is a 73 y.o. female with past medical history significant for GERD, it will bowel syndrome, history of colon polyps, chronic back pain, hypertension and history of smoking but quit earlier this year. The patient mentions that she was seen by her primary care physician, Dr. Abbott Pao, few weeks ago complaining of back pain and cough of 3 weeks duration. Chest x-ray was performed on 02/24/2014 and it showed right lower paratracheal and right hilar adenopathy suspicious for carcinoma of the lung. CT scan of the chest  With contrast was performed on 03/02/2014 and it showed bulky mediastinal adenopathy measuring up to 2.5 x 2.6 CM and the lower right paratracheal station. There was also right hilar nodal mass contiguous with the subcarinal region and chronic 50 measuring 4.4 x 6.2 CM. Nodules are seen in the right lower lobe with the largest subpleural nodule in the right lower lobe measuring 1.1 x 2.5 cm.. Pleural nodularity is seen in the right hemi thorax, with a basilar predominance. Largest pleural or extrapleural nodule is seen in the posterior medial inferior right hemi thorax, measuring 1.6 x 2.5 cm. He was referred to Dr. Melvyn Novas and on 03/14/2014 he underwent bronchoscopy with washing and went biopsy as well as endobronchial biopsy of the right middle lobe. 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  Dr. Melvyn Novas kindly referred the patient to me today for evaluation and recommendation regarding treatment of her recently diagnosed small cell lung cancer. When seen today she continues to complain of the chronic back pain and she is currently on Percocet. She also has headache but no change in her vision. The patient complains of baseline  shortness breath increased with exertion. She lost around 5 pounds in the last few weeks secondary to lack of appetite. The patient denied having any significant fever or chills, no nausea or vomiting.  Family history significant for a mother and father with hypertension. Sister had breast cancer. The patient is married and has 2 children a son and daughter. She was accompanied today by her husband [name] [name] and her daughter [name] [name]. She has a history of smoking less than one pack per day for around 25 years but quit earlier this year. No history of alcohol or drug abuse.   HPI  Past Medical History  Diagnosis Date  . GERD (gastroesophageal reflux disease)   . IBS (irritable bowel syndrome)   . Adenomatous colon polyp 06/2009  . Hiatal hernia   . Breast cyst   . Diverticulosis   . Hemorrhoids   . Hyperlipidemia   . Hypertension   . Hyperparathyroidism   . Anxiety   . Migraine   . Osteoporosis   . Vitamin D deficiency   . Spinal stenosis   . C. difficile colitis 2006    Past Surgical History  Procedure Laterality Date  . Cholecystectomy    . Vaginal hysterectomy    . Knee arthroscopy      left  . Parathyroidectomy  2009    [facility]  . Lumbar spine surgery  2001  . Breast biopsy      bilateral  . Total knee arthroplasty      right  . Bladder repair    . Video bronchoscopy Bilateral 03/14/2014    Procedure: VIDEO BRONCHOSCOPY  WITHOUT FLUORO;  Surgeon: Tanda Rockers, MD;  Location: [facility];  Service: Cardiopulmonary;  Laterality: Bilateral;    Family History  Problem Relation Age of Onset  . Colon polyps Sister   . Irritable bowel syndrome Sister   . Breast cancer Sister   . Diabetes Sister     X 3  . Diabetes Brother   . Colon cancer Neg Hx     Social History History  Substance Use Topics  . Smoking status: Former Smoker -- 0.25 packs/day for 25 years    Types: Cigarettes    Quit date: 05/19/2013  . Smokeless tobacco: Never Used  . Alcohol Use: No      Allergies  Allergen Reactions  . Ace Inhibitors     REACTION: causing cough  . Cephalexin Rash  . Etodolac Rash    Current Outpatient Prescriptions  Medication Sig Dispense Refill  . ALPRAZolam (XANAX) 0.5 MG tablet Take 0.5 mg by mouth at bedtime.        Marland Kitchen atorvastatin (LIPITOR) 80 MG tablet Take 80 mg by mouth daily.        Marland Kitchen buPROPion (WELLBUTRIN SR) 150 MG 12 hr tablet Take 150 mg by mouth daily.        Marland Kitchen denosumab (PROLIA) 60 MG/ML SOLN injection Inject 60 mg into the skin every 6 (six) months. Administer in upper arm, thigh, or abdomen      . dexlansoprazole (DEXILANT) 60 MG capsule TAKE 1 CAPSULE (60 MG TOTAL) BY MOUTH DAILY.  30 capsule  11  . Diclofenac (ZORVOLEX) 35 MG CAPS Take 35 mg by mouth. Take 1 daily by mouth.      Marland Kitchen glycopyrrolate (ROBINUL) 2 MG tablet TAKE 1 TABLET (2 MG TOTAL) BY MOUTH 2 (TWO) TIMES DAILY.  60 tablet  11  . hydrochlorothiazide (HYDRODIURIL) 12.5 MG tablet Take 1 tablet by mouth daily.      Marland Kitchen HYDROcodone-acetaminophen (NORCO/VICODIN) 5-325 MG per tablet Take 1 tablet by mouth every 4 (four) hours as needed.      . metoprolol (TOPROL-XL) 50 MG 24 hr tablet Take 50 mg by mouth daily.        Marland Kitchen oxycodone (OXY-IR) 5 MG capsule 1-2 every 4 hours as needed for back pain  90 capsule  0  . Polyethylene Glycol 3350 (MIRALAX PO) Take by mouth as directed.      . pregabalin (LYRICA) 50 MG capsule Take 50 mg by mouth daily.      Marland Kitchen HYDROcodone-homatropine (HYCODAN) 5-1.5 MG/5ML syrup Take 5 mLs by mouth every 6 (six) hours as needed for cough.      . prochlorperazine (COMPAZINE) 10 MG tablet Take 1 tablet (10 mg total) by mouth every 6 (six) hours as needed for nausea or vomiting.  30 tablet  0  . traMADol (ULTRAM) 50 MG tablet Take 50 mg by mouth 3 (three) times daily as needed.       No current facility-administered medications for this visit.    Review of Systems  Constitutional: positive for anorexia, fatigue and weight loss Eyes: negative Ears, nose,  mouth, throat, and face: negative Respiratory: positive for cough and dyspnea on exertion Cardiovascular: negative Gastrointestinal: negative Genitourinary:negative Integument/breast: negative Hematologic/lymphatic: negative Musculoskeletal:positive for back pain Neurological: negative Behavioral/Psych: negative Endocrine: negative Allergic/Immunologic: negative  Physical Exam  QVZ:DGLOV, healthy, no distress, well nourished, well developed and anxious SKIN: skin color, texture, turgor are normal, no rashes or significant lesions HEAD: Normocephalic, No masses, lesions, tenderness or abnormalities EYES: normal, PERRLA  EARS: External ears normal, Canals clear OROPHARYNX:no exudate, no erythema and lips, buccal mucosa, and tongue normal  NECK: supple, no adenopathy, no JVD LYMPH:  no palpable lymphadenopathy, no hepatosplenomegaly BREAST:not examined LUNGS: expiratory wheezes bilaterally HEART: regular rate & rhythm and no murmurs ABDOMEN:abdomen soft, non-tender, normal bowel sounds and no masses or organomegaly BACK: Back symmetric, no curvature., No CVA tenderness EXTREMITIES:no joint deformities, effusion, or inflammation, no edema, no skin discoloration  NEURO: alert & oriented x 3 with fluent speech, no focal motor/sensory deficits  PERFORMANCE STATUS: ECOG 1  LABORATORY DATA: Lab Results  Component Value Date   WBC 7.9 03/17/2014   HGB 14.1 03/17/2014   HCT 41.4 03/17/2014   MCV 82.9 03/17/2014   PLT 247 03/17/2014      Chemistry      Component Value Date/Time   NA 124* 03/17/2014 0935   NA 130* 02/02/2010 0500   K 3.7 03/17/2014 0935   K 3.7 02/02/2010 0500   CL 95* 02/02/2010 0500   CO2 28 03/17/2014 0935   CO2 27 02/02/2010 0500   BUN 12.3 03/17/2014 0935   BUN 8 02/02/2010 0500   CREATININE 1.2* 03/17/2014 0935   CREATININE 0.86 02/02/2010 0500      Component Value Date/Time   CALCIUM 10.2 03/17/2014 0935   CALCIUM 8.4 02/02/2010 0500   ALKPHOS 110  03/17/2014 0935   ALKPHOS 112 01/25/2010 1100   AST 31 03/17/2014 0935   AST 27 01/25/2010 1100   ALT 20 03/17/2014 0935   ALT 33 01/25/2010 1100   BILITOT 0.80 03/17/2014 0935   BILITOT 0.9 01/25/2010 1100       RADIOGRAPHIC STUDIES: Dg Chest 2 View  02/24/2014   CLINICAL DATA:  Cough and chest wall pain  EXAM: CHEST  2 VIEW  COMPARISON:  01/25/2010.  Chest CT 09/02/2008  FINDINGS: Interval enlargement of the right hilum worrisome for adenopathy. Further evaluation with chest CT with contrast suggested to rule out underlying mass or adenopathy. There may also be some right lower peritracheal adenopathy. Rule out lung cancer.  COPD with mild hyperinflation. Negative for pneumonia or effusion. Negative for heart failure. Heart size is normal.  IMPRESSION: Findings are suspicious for right lower peritracheal and right hilar adenopathy. Rule out carcinoma of the lung. Chest CT with contrast suggested for further evaluation.  These results will be called to the ordering clinician or representative by the Radiologist Assistant, and communication documented in the PACS or zVision Dashboard.   Electronically Signed   By: Franchot Gallo M.D.   On: 02/24/2014 15:08   Ct Chest W Contrast  03/02/2014   CLINICAL DATA:  Cough and left-sided chest pain. Acute, initial on counter.  EXAM: CT CHEST WITH CONTRAST  TECHNIQUE: Multidetector CT imaging of the chest was performed during intravenous contrast administration.  CONTRAST:  45mL OMNIPAQUE IOHEXOL 300 MG/ML  SOLN  COMPARISON:  09/02/2008.  FINDINGS: 10 mm low-attenuation lesion in the right lobe of the thyroid is incidentally noted. Bulky mediastinal adenopathy measures up to 2.5 x 2.6 cm in the lower right paratracheal station. Right hilar nodal mass is contiguous with the subcarinal region, collectively measuring 4.4 x 6.2 cm. No left hilar axillary or supraclavicular adenopathy. Atherosclerotic calcification of the arterial vasculature, including three-vessel  involvement of the coronary arteries. Heart size normal. No pericardial effusion.  Centrilobular and paraseptal emphysema. Nodules are seen in the right lower lobe with the largest subpleural nodule in the right lower lobe measuring 1.1 x 2.5 cm (series  3, image 35). Pleural nodularity is seen in the right hemi thorax, with a basilar predominance. Largest pleural or extrapleural nodule is seen in the posterior medial inferior right hemi thorax, measuring 1.6 x 2.5 cm (image 47). Left lung is clear. No pleural fluid. There is narrowing of the bronchus intermedius. Airway is otherwise unremarkable.  Incidental imaging of the upper abdomen shows intermediate density lesions in the liver measuring up to 3.1 x 3.2 cm in the left hepatic lobe, new. Cholecystectomy with stable biliary ductal dilatation. Right adrenal nodularity measures up to 1.3 x 1.3 cm, stable from 09/02/2008. Visualized portions of the left adrenal gland, kidneys, spleen, pancreas, stomach and bowel are otherwise grossly unremarkable. No upper abdominal adenopathy.  A lucent lesion in the T8 vertebral body measures 2.1 x 2.5 cm, with associated compression deformity. Sclerosis and probable hemangioma in the T11 vertebral body are unchanged.  IMPRESSION: 1. Stage IV primary bronchogenic carcinoma as evidenced by bulky mediastinal and right hilar adenopathy, right lower lobe nodules, right pleural/extrapleural nodularity, new hepatic lesions and new T8 lytic lesion. 2. Pathologic T8 compression fracture. 3. Bulky right hilar nodal mass results in narrowing of the bronchus intermedius. 4. Right adrenal nodularity is unchanged from 09/02/2008, indicating benign adrenal adenomas. 5. Three-vessel coronary artery calcification.   Electronically Signed   By: Lorin Picket M.D.   On: 03/02/2014 11:25    ASSESSMENT: This is a very pleasant 73 years old white female recently diagnosed with extensive stage, stage IV (T3, N2, M1b) small cell lung cancer  diagnosed in October of 2015, presented with bulky mediastinal lymphadenopathy in addition to a right lower lobe nodules and a right pleural nodularity as well as metastatic bone lesion at T8.    PLAN: I had a lengthy discussion with the patient and her family today about her current disease stage, prognosis and treatment options. I explained to the patient that she has incurable condition and on the treatment would be of palliative nature.  I will complete the staging workup by ordering MRI of the brain as well as a PET scan. I gave the patient the option of palliative care and hospice referral versus consideration of systemic chemotherapy with carboplatin for AUC of 5 on day 1 and etoposide 100 MG/M2 on days 1, 2 and 3 with Neulasta support on day 4. I discussed with the patient adverse effect of the chemotherapy including but not limited to alopecia, myelosuppression, nausea and vomiting, peripheral neuropathy, liver or renal dysfunction. The patient would like to proceed with systemic chemotherapy. She is expected to start the first cycle of her treatment next week. She would have a chemotherapy education class before starting the first dose of her chemotherapy She would come back for followup visit in 2 weeks for evaluation and management any adverse effect of her chemotherapy. The patient voices understanding of current disease status and treatment options and is in agreement with the current care plan. She was advised to call immediately if she has any concerning symptoms in the interval.  All questions were answered. The patient knows to call the clinic with any problems, questions or concerns. We can certainly see the patient much sooner if necessary.  Thank you so much for allowing me to participate in the care of [name] [name]. I will continue to follow up the patient with you and assist in her care.  I spent 55 minutes counseling the patient face to face. The total time spent in the  appointment was 80 minutes.  Disclaimer: This note was dictated with voice recognition software. Similar sounding words can inadvertently be transcribed and may not be corrected upon review.   Kyan Giannone K. 03/18/2014, 10:39 PM

## 2014-03-20 ENCOUNTER — Other Ambulatory Visit: Payer: Medicare Other

## 2014-03-21 ENCOUNTER — Other Ambulatory Visit (HOSPITAL_BASED_OUTPATIENT_CLINIC_OR_DEPARTMENT_OTHER): Payer: Medicare Other

## 2014-03-21 ENCOUNTER — Other Ambulatory Visit: Payer: Self-pay | Admitting: Internal Medicine

## 2014-03-21 ENCOUNTER — Ambulatory Visit (HOSPITAL_BASED_OUTPATIENT_CLINIC_OR_DEPARTMENT_OTHER): Payer: Medicare Other

## 2014-03-21 DIAGNOSIS — Z5111 Encounter for antineoplastic chemotherapy: Secondary | ICD-10-CM

## 2014-03-21 DIAGNOSIS — C342 Malignant neoplasm of middle lobe, bronchus or lung: Secondary | ICD-10-CM

## 2014-03-21 DIAGNOSIS — C3491 Malignant neoplasm of unspecified part of right bronchus or lung: Secondary | ICD-10-CM

## 2014-03-21 LAB — CBC WITH DIFFERENTIAL/PLATELET
BASO%: 0.3 % (ref 0.0–2.0)
BASOS ABS: 0 10*3/uL (ref 0.0–0.1)
EOS ABS: 0 10*3/uL (ref 0.0–0.5)
EOS%: 0.4 % (ref 0.0–7.0)
HCT: 41 % (ref 34.8–46.6)
HEMOGLOBIN: 14.7 g/dL (ref 11.6–15.9)
LYMPH#: 1.4 10*3/uL (ref 0.9–3.3)
LYMPH%: 20.7 % (ref 14.0–49.7)
MCH: 28.7 pg (ref 25.1–34.0)
MCHC: 35.9 g/dL (ref 31.5–36.0)
MCV: 80.1 fL (ref 79.5–101.0)
MONO#: 0.8 10*3/uL (ref 0.1–0.9)
MONO%: 11.5 % (ref 0.0–14.0)
NEUT%: 67.1 % (ref 38.4–76.8)
NEUTROS ABS: 4.7 10*3/uL (ref 1.5–6.5)
Platelets: 228 10*3/uL (ref 145–400)
RBC: 5.12 10*6/uL (ref 3.70–5.45)
RDW: 13.3 % (ref 11.2–14.5)
WBC: 7 10*3/uL (ref 3.9–10.3)
nRBC: 0 % (ref 0–0)

## 2014-03-21 LAB — COMPREHENSIVE METABOLIC PANEL (CC13)
ALBUMIN: 4 g/dL (ref 3.5–5.0)
ALK PHOS: 109 U/L (ref 40–150)
ALT: 31 U/L (ref 0–55)
ANION GAP: 12 meq/L — AB (ref 3–11)
AST: 36 U/L — ABNORMAL HIGH (ref 5–34)
BUN: 15.4 mg/dL (ref 7.0–26.0)
CHLORIDE: 88 meq/L — AB (ref 98–109)
CO2: 24 meq/L (ref 22–29)
Calcium: 10.5 mg/dL — ABNORMAL HIGH (ref 8.4–10.4)
Creatinine: 1.3 mg/dL — ABNORMAL HIGH (ref 0.6–1.1)
GLUCOSE: 115 mg/dL (ref 70–140)
POTASSIUM: 3.7 meq/L (ref 3.5–5.1)
SODIUM: 125 meq/L — AB (ref 136–145)
TOTAL PROTEIN: 7.5 g/dL (ref 6.4–8.3)
Total Bilirubin: 0.8 mg/dL (ref 0.20–1.20)

## 2014-03-21 MED ORDER — ONDANSETRON 16 MG/50ML IVPB (CHCC)
INTRAVENOUS | Status: AC
  Filled 2014-03-21: qty 16

## 2014-03-21 MED ORDER — ETOPOSIDE CHEMO INJECTION 1 GM/50ML
100.0000 mg/m2 | Freq: Once | INTRAVENOUS | Status: AC
  Administered 2014-03-21: 180 mg via INTRAVENOUS
  Filled 2014-03-21: qty 9

## 2014-03-21 MED ORDER — SODIUM CHLORIDE 0.9 % IV SOLN
Freq: Once | INTRAVENOUS | Status: AC
  Administered 2014-03-21: 13:00:00 via INTRAVENOUS

## 2014-03-21 MED ORDER — ONDANSETRON 16 MG/50ML IVPB (CHCC)
16.0000 mg | Freq: Once | INTRAVENOUS | Status: AC
  Administered 2014-03-21: 16 mg via INTRAVENOUS

## 2014-03-21 MED ORDER — DEXAMETHASONE SODIUM PHOSPHATE 20 MG/5ML IJ SOLN
20.0000 mg | Freq: Once | INTRAMUSCULAR | Status: AC
  Administered 2014-03-21: 20 mg via INTRAVENOUS

## 2014-03-21 MED ORDER — DEXAMETHASONE SODIUM PHOSPHATE 20 MG/5ML IJ SOLN
INTRAMUSCULAR | Status: AC
  Filled 2014-03-21: qty 5

## 2014-03-21 MED ORDER — CARBOPLATIN CHEMO INJECTION 450 MG/45ML
342.5000 mg | Freq: Once | INTRAVENOUS | Status: AC
  Administered 2014-03-21: 340 mg via INTRAVENOUS
  Filled 2014-03-21: qty 34

## 2014-03-21 NOTE — Progress Notes (Signed)
Late Entry:  5868 Labs reviewed by Dr. Julien Nordmann; OK to treat per MD.

## 2014-03-21 NOTE — Patient Instructions (Signed)
[facility] Discharge Instructions for Patients Receiving Chemotherapy  Today you received the following chemotherapy agents Etoposide and Carboplatin  To help prevent nausea and vomiting after your treatment, we encourage you to take your nausea medication Compazine 10 mg every 6 hours as needed.   If you develop nausea and vomiting that is not controlled by your nausea medication, call the clinic.   BELOW ARE SYMPTOMS THAT SHOULD BE REPORTED IMMEDIATELY:  *FEVER GREATER THAN 100.5 F  *CHILLS WITH OR WITHOUT FEVER  NAUSEA AND VOMITING THAT IS NOT CONTROLLED WITH YOUR NAUSEA MEDICATION  *UNUSUAL SHORTNESS OF BREATH  *UNUSUAL BRUISING OR BLEEDING  TENDERNESS IN MOUTH AND THROAT WITH OR WITHOUT PRESENCE OF ULCERS  *URINARY PROBLEMS  *BOWEL PROBLEMS  UNUSUAL RASH Items with * indicate a potential emergency and should be followed up as soon as possible.  Feel free to call the clinic you have any questions or concerns. The clinic phone number is [phone].

## 2014-03-22 ENCOUNTER — Ambulatory Visit (HOSPITAL_BASED_OUTPATIENT_CLINIC_OR_DEPARTMENT_OTHER): Payer: Medicare Other

## 2014-03-22 DIAGNOSIS — C342 Malignant neoplasm of middle lobe, bronchus or lung: Secondary | ICD-10-CM

## 2014-03-22 DIAGNOSIS — Z5111 Encounter for antineoplastic chemotherapy: Secondary | ICD-10-CM

## 2014-03-22 DIAGNOSIS — C3491 Malignant neoplasm of unspecified part of right bronchus or lung: Secondary | ICD-10-CM

## 2014-03-22 MED ORDER — SODIUM CHLORIDE 0.9 % IV SOLN
100.0000 mg/m2 | Freq: Once | INTRAVENOUS | Status: AC
  Administered 2014-03-22: 180 mg via INTRAVENOUS
  Filled 2014-03-22: qty 9

## 2014-03-22 MED ORDER — PROCHLORPERAZINE MALEATE 10 MG PO TABS
ORAL_TABLET | ORAL | Status: AC
  Filled 2014-03-22: qty 1

## 2014-03-22 MED ORDER — SODIUM CHLORIDE 0.9 % IV SOLN
Freq: Once | INTRAVENOUS | Status: AC
  Administered 2014-03-22: 15:00:00 via INTRAVENOUS

## 2014-03-22 MED ORDER — PROCHLORPERAZINE MALEATE 10 MG PO TABS
10.0000 mg | ORAL_TABLET | Freq: Once | ORAL | Status: AC
  Administered 2014-03-22: 10 mg via ORAL

## 2014-03-22 NOTE — Patient Instructions (Signed)
[facility] Discharge Instructions for Patients Receiving Chemotherapy  Today you received the following chemotherapy agents Etoposide  To help prevent nausea and vomiting after your treatment, we encourage you to take your nausea medication  Compazine   If you develop nausea and vomiting that is not controlled by your nausea medication, call the clinic.   BELOW ARE SYMPTOMS THAT SHOULD BE REPORTED IMMEDIATELY:  *FEVER GREATER THAN 100.5 F  *CHILLS WITH OR WITHOUT FEVER  NAUSEA AND VOMITING THAT IS NOT CONTROLLED WITH YOUR NAUSEA MEDICATION  *UNUSUAL SHORTNESS OF BREATH  *UNUSUAL BRUISING OR BLEEDING  TENDERNESS IN MOUTH AND THROAT WITH OR WITHOUT PRESENCE OF ULCERS  *URINARY PROBLEMS  *BOWEL PROBLEMS  UNUSUAL RASH Items with * indicate a potential emergency and should be followed up as soon as possible.  Feel free to call the clinic you have any questions or concerns. The clinic phone number is [phone].

## 2014-03-23 ENCOUNTER — Ambulatory Visit (HOSPITAL_BASED_OUTPATIENT_CLINIC_OR_DEPARTMENT_OTHER): Payer: Medicare Other

## 2014-03-23 DIAGNOSIS — C342 Malignant neoplasm of middle lobe, bronchus or lung: Secondary | ICD-10-CM

## 2014-03-23 DIAGNOSIS — C3491 Malignant neoplasm of unspecified part of right bronchus or lung: Secondary | ICD-10-CM

## 2014-03-23 DIAGNOSIS — Z5111 Encounter for antineoplastic chemotherapy: Secondary | ICD-10-CM

## 2014-03-23 DIAGNOSIS — C7951 Secondary malignant neoplasm of bone: Secondary | ICD-10-CM

## 2014-03-23 MED ORDER — SODIUM CHLORIDE 0.9 % IV SOLN
Freq: Once | INTRAVENOUS | Status: AC
  Administered 2014-03-23: 15:00:00 via INTRAVENOUS

## 2014-03-23 MED ORDER — PROCHLORPERAZINE MALEATE 10 MG PO TABS
ORAL_TABLET | ORAL | Status: AC
  Filled 2014-03-23: qty 1

## 2014-03-23 MED ORDER — SODIUM CHLORIDE 0.9 % IV SOLN
100.0000 mg/m2 | Freq: Once | INTRAVENOUS | Status: AC
  Administered 2014-03-23: 180 mg via INTRAVENOUS
  Filled 2014-03-23: qty 9

## 2014-03-23 MED ORDER — PROCHLORPERAZINE MALEATE 10 MG PO TABS
10.0000 mg | ORAL_TABLET | Freq: Once | ORAL | Status: AC
  Administered 2014-03-23: 10 mg via ORAL

## 2014-03-23 NOTE — Patient Instructions (Signed)
[facility] Discharge Instructions for Patients Receiving Chemotherapy  Today you received the following chemotherapy agents Etoposide.  To help prevent nausea and vomiting after your treatment, we encourage you to take your nausea medication as prescribed.   If you develop nausea and vomiting that is not controlled by your nausea medication, call the clinic.   BELOW ARE SYMPTOMS THAT SHOULD BE REPORTED IMMEDIATELY:  *FEVER GREATER THAN 100.5 F  *CHILLS WITH OR WITHOUT FEVER  NAUSEA AND VOMITING THAT IS NOT CONTROLLED WITH YOUR NAUSEA MEDICATION  *UNUSUAL SHORTNESS OF BREATH  *UNUSUAL BRUISING OR BLEEDING  TENDERNESS IN MOUTH AND THROAT WITH OR WITHOUT PRESENCE OF ULCERS  *URINARY PROBLEMS  *BOWEL PROBLEMS  UNUSUAL RASH Items with * indicate a potential emergency and should be followed up as soon as possible.  Feel free to call the clinic you have any questions or concerns. The clinic phone number is [phone].

## 2014-03-24 ENCOUNTER — Ambulatory Visit (HOSPITAL_BASED_OUTPATIENT_CLINIC_OR_DEPARTMENT_OTHER): Payer: Medicare Other

## 2014-03-24 ENCOUNTER — Telehealth: Payer: Self-pay | Admitting: *Deleted

## 2014-03-24 DIAGNOSIS — C3491 Malignant neoplasm of unspecified part of right bronchus or lung: Secondary | ICD-10-CM

## 2014-03-24 DIAGNOSIS — Z5189 Encounter for other specified aftercare: Secondary | ICD-10-CM

## 2014-03-24 DIAGNOSIS — C7951 Secondary malignant neoplasm of bone: Secondary | ICD-10-CM

## 2014-03-24 DIAGNOSIS — C342 Malignant neoplasm of middle lobe, bronchus or lung: Secondary | ICD-10-CM

## 2014-03-24 MED ORDER — PEGFILGRASTIM INJECTION 6 MG/0.6ML ~~LOC~~
6.0000 mg | PREFILLED_SYRINGE | Freq: Once | SUBCUTANEOUS | Status: AC
  Administered 2014-03-24: 6 mg via SUBCUTANEOUS
  Filled 2014-03-24: qty 0.6

## 2014-03-24 NOTE — Telephone Encounter (Signed)
-----   Message from [place], RN sent at 03/21/2014  2:30 PM EST ----- Regarding: chemo follow-up call 1st Carbo/VP-16.  Dr. Julien Nordmann

## 2014-03-24 NOTE — Telephone Encounter (Signed)
Pt was seen in injection room today. Has picked up mag citrate on way home from [facility]. Will call if has further problems

## 2014-03-24 NOTE — Progress Notes (Signed)
Chemo follow up call.  Patient is here for neulasta.  She has some nausea, no vomiting.  She has taken one compazine this am and this helped.  She is experiencing constipation. She took Miralax last night, but no results.  Confered with Simon Rhein RN with Dr. Julien Nordmann.  Recommended that she go ahead and use Magnesium Citrate today and gave her instructions for this in AVS.  Also recommended stool softner.  Constipation is probably due to pain medication as this is not a new problem.  She is not having any mucositis or new fatigue. Reviewed instructions with patient and husband, as well as written AVS.  They know to call us if they have problems this weekend.  She will dicuss having a PAC placed when she sees Dr. Julien Nordmann next week.  She has no other questions.

## 2014-03-24 NOTE — Patient Instructions (Signed)
Pegfilgrastim injection What is this medicine? PEGFILGRASTIM (peg fil GRA stim) is a long-acting granulocyte colony-stimulating factor that stimulates the growth of neutrophils, a type of white blood cell important in the body's fight against infection. It is used to reduce the incidence of fever and infection in patients with certain types of cancer who are receiving chemotherapy that affects the bone marrow. This medicine may be used for other purposes; ask your health care provider or pharmacist if you have questions. COMMON BRAND NAME(S): Neulasta What should I tell my health care provider before I take this medicine? They need to know if you have any of these conditions: -latex allergy -ongoing radiation therapy -sickle cell disease -skin reactions to acrylic adhesives (On-Body Injector only) -an unusual or allergic reaction to pegfilgrastim, filgrastim, other medicines, foods, dyes, or preservatives -pregnant or trying to get pregnant -breast-feeding How should I use this medicine? This medicine is for injection under the skin. If you get this medicine at home, you will be taught how to prepare and give the pre-filled syringe or how to use the On-body Injector. Refer to the patient Instructions for Use for detailed instructions. Use exactly as directed. Take your medicine at regular intervals. Do not take your medicine more often than directed. It is important that you put your used needles and syringes in a special sharps container. Do not put them in a trash can. If you do not have a sharps container, call your pharmacist or healthcare provider to get one. Talk to your pediatrician regarding the use of this medicine in children. Special care may be needed. Overdosage: If you think you have taken too much of this medicine contact a poison control center or emergency room at once. NOTE: This medicine is only for you. Do not share this medicine with others. What if I miss a dose? It is  important not to miss your dose. Call your doctor or health care professional if you miss your dose. If you miss a dose due to an On-body Injector failure or leakage, a new dose should be administered as soon as possible using a single prefilled syringe for manual use. What may interact with this medicine? Interactions have not been studied. Give your health care provider a list of all the medicines, herbs, non-prescription drugs, or dietary supplements you use. Also tell them if you smoke, drink alcohol, or use illegal drugs. Some items may interact with your medicine. This list may not describe all possible interactions. Give your health care provider a list of all the medicines, herbs, non-prescription drugs, or dietary supplements you use. Also tell them if you smoke, drink alcohol, or use illegal drugs. Some items may interact with your medicine. What should I watch for while using this medicine? You may need blood work done while you are taking this medicine. If you are going to need a MRI, CT scan, or other procedure, tell your doctor that you are using this medicine (On-Body Injector only). What side effects may I notice from receiving this medicine? Side effects that you should report to your doctor or health care professional as soon as possible: -allergic reactions like skin rash, itching or hives, swelling of the face, lips, or tongue -dizziness -fever -pain, redness, or irritation at site where injected -pinpoint red spots on the skin -shortness of breath or breathing problems -stomach or side pain, or pain at the shoulder -swelling -tiredness -trouble passing urine Side effects that usually do not require medical attention (report to your doctor   or health care professional if they continue or are bothersome): -bone pain -muscle pain This list may not describe all possible side effects. Call your doctor for medical advice about side effects. You may report side effects to FDA at  1-800-FDA-1088. Where should I keep my medicine? Keep out of the reach of children. Store pre-filled syringes in a refrigerator between 2 and 8 degrees C (36 and 46 degrees F). Do not freeze. Keep in carton to protect from light. Throw away this medicine if it is left out of the refrigerator for more than 48 hours. Throw away any unused medicine after the expiration date. NOTE: This sheet is a summary. It may not cover all possible information. If you have questions about this medicine, talk to your doctor, pharmacist, or health care provider.  2015, Elsevier/Gold Standard. (2013-08-04 16:14:05)  You are having some constipation.  We recommend Miralax and if that is not helpful go ahead and use Magnesium Citrate. Take 1/2 bottle and then if no resutls after 3 hours take the rest of the bottle.  Go ahead and take some stool softner also - like colace.  It is important to drink plenty of fluids with your treatment.  Your pain medicine may be contributing to your constipation, therefore you may have to take Miralax on a regular basis.

## 2014-03-28 ENCOUNTER — Encounter: Payer: Self-pay | Admitting: Physician Assistant

## 2014-03-28 ENCOUNTER — Other Ambulatory Visit: Payer: Medicare Other

## 2014-03-28 ENCOUNTER — Other Ambulatory Visit (HOSPITAL_BASED_OUTPATIENT_CLINIC_OR_DEPARTMENT_OTHER): Payer: Medicare Other

## 2014-03-28 ENCOUNTER — Ambulatory Visit (HOSPITAL_BASED_OUTPATIENT_CLINIC_OR_DEPARTMENT_OTHER): Payer: Medicare Other | Admitting: Physician Assistant

## 2014-03-28 VITALS — BP 118/57 | HR 103 | Temp 98.3°F | Resp 18 | Ht 65.0 in | Wt 147.2 lb

## 2014-03-28 DIAGNOSIS — C3491 Malignant neoplasm of unspecified part of right bronchus or lung: Secondary | ICD-10-CM

## 2014-03-28 DIAGNOSIS — D72819 Decreased white blood cell count, unspecified: Secondary | ICD-10-CM

## 2014-03-28 DIAGNOSIS — C3492 Malignant neoplasm of unspecified part of left bronchus or lung: Secondary | ICD-10-CM

## 2014-03-28 DIAGNOSIS — C7951 Secondary malignant neoplasm of bone: Secondary | ICD-10-CM

## 2014-03-28 DIAGNOSIS — D702 Other drug-induced agranulocytosis: Secondary | ICD-10-CM

## 2014-03-28 DIAGNOSIS — I878 Other specified disorders of veins: Secondary | ICD-10-CM

## 2014-03-28 DIAGNOSIS — C342 Malignant neoplasm of middle lobe, bronchus or lung: Secondary | ICD-10-CM

## 2014-03-28 LAB — COMPREHENSIVE METABOLIC PANEL (CC13)
ALT: 14 U/L (ref 0–55)
AST: 24 U/L (ref 5–34)
Albumin: 3.9 g/dL (ref 3.5–5.0)
Alkaline Phosphatase: 98 U/L (ref 40–150)
Anion Gap: 9 mEq/L (ref 3–11)
BUN: 22.6 mg/dL (ref 7.0–26.0)
CALCIUM: 9.3 mg/dL (ref 8.4–10.4)
CHLORIDE: 94 meq/L — AB (ref 98–109)
CO2: 28 meq/L (ref 22–29)
CREATININE: 1 mg/dL (ref 0.6–1.1)
Glucose: 103 mg/dl (ref 70–140)
Potassium: 4 mEq/L (ref 3.5–5.1)
SODIUM: 131 meq/L — AB (ref 136–145)
TOTAL PROTEIN: 6.9 g/dL (ref 6.4–8.3)
Total Bilirubin: 1.34 mg/dL — ABNORMAL HIGH (ref 0.20–1.20)

## 2014-03-28 LAB — CBC WITH DIFFERENTIAL/PLATELET
BASO%: 2.8 % — AB (ref 0.0–2.0)
Basophils Absolute: 0 10*3/uL (ref 0.0–0.1)
EOS ABS: 0.1 10*3/uL (ref 0.0–0.5)
EOS%: 8.5 % — ABNORMAL HIGH (ref 0.0–7.0)
HCT: 37.1 % (ref 34.8–46.6)
HGB: 13 g/dL (ref 11.6–15.9)
LYMPH#: 0.5 10*3/uL — AB (ref 0.9–3.3)
LYMPH%: 69 % — AB (ref 14.0–49.7)
MCH: 28.6 pg (ref 25.1–34.0)
MCHC: 35 g/dL (ref 31.5–36.0)
MCV: 81.5 fL (ref 79.5–101.0)
MONO#: 0 10*3/uL — AB (ref 0.1–0.9)
MONO%: 1.4 % (ref 0.0–14.0)
NEUT%: 18.3 % — ABNORMAL LOW (ref 38.4–76.8)
NEUTROS ABS: 0.1 10*3/uL — AB (ref 1.5–6.5)
NRBC: 0 % (ref 0–0)
Platelets: 140 10*3/uL — ABNORMAL LOW (ref 145–400)
RBC: 4.55 10*6/uL (ref 3.70–5.45)
RDW: 13.2 % (ref 11.2–14.5)
WBC: 0.7 10*3/uL — CL (ref 3.9–10.3)

## 2014-03-28 MED ORDER — CIPROFLOXACIN HCL 500 MG PO TABS: 500.0000 mg | ORAL_TABLET | Freq: Two times a day (BID) | ORAL | Status: DC

## 2014-03-28 MED ORDER — PROCHLORPERAZINE MALEATE 10 MG PO TABS: 10.0000 mg | ORAL_TABLET | Freq: Four times a day (QID) | ORAL | Status: DC | PRN

## 2014-03-28 NOTE — Progress Notes (Addendum)
No images are attached to the encounter. No scans are attached to the encounter. No scans are attached to the encounter. [facility] SHARED VISIT PROGRESS NOTE  [name], MD [address] [place] [address]  DIAGNOSIS: Small cell carcinoma of lung   Staging form: Lung, AJCC 7th Edition     Clinical: Stage IV (T3, N2, M1b) - Signed by Curt Bears, MD on 03/17/2014     Pathologic: No stage assigned - Unsigned  PRIOR THERAPY: None  CURRENT THERAPY: systemic chemotherapy with carboplatin for an  AUC of 5 given on day 1 and etoposide at 100 mg/mgiven on days 1, 2 and 3 with Neulasta support given on day 4. Status post 1 cycle.  INTERVAL HISTORY: [name] 73 y.o. female returns for a scheduled regular symptom management visit for followup of recently diagnosed extensive stage, stage IV (T3 N2, M1 B) small cell lung cancer diagnosed in October 2015. She is status post her first cycle systemic chemotherapy with carboplatin and etoposide with the with Neulasta support. She had some nausea and mild vomiting as well as some constipation required magnesium citrate. Magnesium citrate lytic diarrhea. She's not had a by movement since Sunday morning. She reports eating a little bit better. She did receive Neulasta on 03/24/2014. She denied fever or chills. She denied cough shortness of breath, significant weight loss or night sweats. She is having some difficulty with peripheral access and would like a Port-A-Cath placed.she requests a refill for her Compazine.  MEDICAL HISTORY: Past Medical History  Diagnosis Date  . GERD (gastroesophageal reflux disease)   . IBS (irritable bowel syndrome)   . Adenomatous colon polyp 06/2009  . Hiatal hernia   . Breast cyst   . Diverticulosis   . Hemorrhoids   . Hyperlipidemia   . Hypertension   . Hyperparathyroidism   . Anxiety   . Migraine   . Osteoporosis   . Vitamin D deficiency   . Spinal stenosis   . C. difficile colitis 2006     ALLERGIES:  is allergic to ace inhibitors; cephalexin; and etodolac.  MEDICATIONS:  Current Outpatient Prescriptions  Medication Sig Dispense Refill  . atorvastatin (LIPITOR) 80 MG tablet Take 80 mg by mouth daily.      Marland Kitchen buPROPion (WELLBUTRIN SR) 150 MG 12 hr tablet Take 150 mg by mouth daily.      Marland Kitchen dexlansoprazole (DEXILANT) 60 MG capsule TAKE 1 CAPSULE (60 MG TOTAL) BY MOUTH DAILY. 30 capsule 11  . Diclofenac (ZORVOLEX) 35 MG CAPS Take 35 mg by mouth. Take 1 daily by mouth.    Marland Kitchen glycopyrrolate (ROBINUL) 2 MG tablet TAKE 1 TABLET (2 MG TOTAL) BY MOUTH 2 (TWO) TIMES DAILY. 60 tablet 11  . hydrochlorothiazide (HYDRODIURIL) 12.5 MG tablet Take 1 tablet by mouth daily.    . metoprolol (TOPROL-XL) 50 MG 24 hr tablet Take 50 mg by mouth daily.      Marland Kitchen oxycodone (OXY-IR) 5 MG capsule 1-2 every 4 hours as needed for back pain 90 capsule 0  . Polyethylene Glycol 3350 (MIRALAX PO) Take by mouth as directed.    . prochlorperazine (COMPAZINE) 10 MG tablet Take 1 tablet (10 mg total) by mouth every 6 (six) hours as needed for nausea or vomiting. 30 tablet 1  . ALPRAZolam (XANAX) 0.5 MG tablet Take 0.5 mg by mouth at bedtime.      . ciprofloxacin (CIPRO) 500 MG tablet Take 1 tablet (500 mg total) by mouth 2 (two) times daily.  10 tablet 0  . denosumab (PROLIA) 60 MG/ML SOLN injection Inject 60 mg into the skin every 6 (six) months. Administer in upper arm, thigh, or abdomen    . HYDROcodone-acetaminophen (NORCO/VICODIN) 5-325 MG per tablet Take 1 tablet by mouth every 4 (four) hours as needed.    Marland Kitchen HYDROcodone-homatropine (HYCODAN) 5-1.5 MG/5ML syrup Take 5 mLs by mouth every 6 (six) hours as needed for cough.    Marland Kitchen levofloxacin (LEVAQUIN) 500 MG tablet   0  . pregabalin (LYRICA) 50 MG capsule Take 50 mg by mouth daily.    . traMADol (ULTRAM) 50 MG tablet Take 50 mg by mouth 3 (three) times daily as needed.     No current facility-administered medications for this visit.    SURGICAL HISTORY:   Past Surgical History  Procedure Laterality Date  . Cholecystectomy    . Vaginal hysterectomy    . Knee arthroscopy      left  . Parathyroidectomy  2009    [facility]  . Lumbar spine surgery  2001  . Breast biopsy      bilateral  . Total knee arthroplasty      right  . Bladder repair    . Video bronchoscopy Bilateral 03/14/2014    Procedure: VIDEO BRONCHOSCOPY WITHOUT FLUORO;  Surgeon: Tanda Rockers, MD;  Location: [facility];  Service: Cardiopulmonary;  Laterality: Bilateral;    REVIEW OF SYSTEMS:  Review of Systems  Constitutional: Negative for fever, chills, weight loss, malaise/fatigue and diaphoresis.  HENT: Negative for congestion, ear discharge, ear pain, hearing loss, nosebleeds, sore throat and tinnitus.   Eyes: Negative for blurred vision, double vision, photophobia, pain, discharge and redness.  Respiratory: Negative for cough, hemoptysis, sputum production, shortness of breath, wheezing and stridor.   Cardiovascular: Negative for chest pain, palpitations, orthopnea, claudication, leg swelling and PND.  Gastrointestinal: Positive for nausea, vomiting and constipation. Negative for heartburn, abdominal pain, diarrhea, blood in stool and melena.  Genitourinary: Negative.   Musculoskeletal: Negative.   Skin: Negative.   Neurological: Negative for dizziness, tingling, focal weakness, seizures, weakness and headaches.  Endo/Heme/Allergies: Does not bruise/bleed easily.  Psychiatric/Behavioral: Negative for depression. The patient is not nervous/anxious and does not have insomnia.      PHYSICAL EXAMINATION: Physical Exam  Constitutional: She is oriented to person, place, and time and well-developed, well-nourished, and in no distress.  HENT:  Head: Normocephalic and atraumatic.  Mouth/Throat: Oropharynx is clear and moist.  Eyes: Pupils are equal, round, and reactive to light.  Neck: Normal range of motion. Neck supple. No JVD present. No tracheal deviation  present. No thyromegaly present.  Cardiovascular: Normal rate, regular rhythm, normal heart sounds and intact distal pulses.  Exam reveals no gallop and no friction rub.   No murmur heard. Pulmonary/Chest: Effort normal and breath sounds normal. No respiratory distress. She has no wheezes. She has no rales.  Abdominal: Soft. Bowel sounds are normal. She exhibits no distension and no mass. There is no tenderness.  Musculoskeletal: Normal range of motion. She exhibits no edema or tenderness.  Lymphadenopathy:    She has no cervical adenopathy.  Neurological: She is alert and oriented to person, place, and time. She has normal reflexes. Gait normal.  Skin: Skin is warm and dry. No rash noted.    ECOG PERFORMANCE STATUS: 1 - Symptomatic but completely ambulatory  Blood pressure 118/57, pulse 103, temperature 98.3 F (36.8 C), temperature source Oral, resp. rate 18, height 5\' 5"  (1.651 m), weight 147 lb 3.2 oz (  66.769 kg).  LABORATORY DATA: Lab Results  Component Value Date   WBC 0.7* 03/28/2014   HGB 13.0 03/28/2014   HCT 37.1 03/28/2014   MCV 81.5 03/28/2014   PLT 140* 03/28/2014      Chemistry      Component Value Date/Time   NA 131* 03/28/2014 1211   NA 130* 02/02/2010 0500   K 4.0 03/28/2014 1211   K 3.7 02/02/2010 0500   CL 95* 02/02/2010 0500   CO2 28 03/28/2014 1211   CO2 27 02/02/2010 0500   BUN 22.6 03/28/2014 1211   BUN 8 02/02/2010 0500   CREATININE 1.0 03/28/2014 1211   CREATININE 0.86 02/02/2010 0500      Component Value Date/Time   CALCIUM 9.3 03/28/2014 1211   CALCIUM 8.4 02/02/2010 0500   ALKPHOS 98 03/28/2014 1211   ALKPHOS 112 01/25/2010 1100   AST 24 03/28/2014 1211   AST 27 01/25/2010 1100   ALT 14 03/28/2014 1211   ALT 33 01/25/2010 1100   BILITOT 1.34* 03/28/2014 1211   BILITOT 0.9 01/25/2010 1100       RADIOGRAPHIC STUDIES:  Ct Chest W Contrast  03/02/2014   CLINICAL DATA:  Cough and left-sided chest pain. Acute, initial on counter.   EXAM: CT CHEST WITH CONTRAST  TECHNIQUE: Multidetector CT imaging of the chest was performed during intravenous contrast administration.  CONTRAST:  58mL OMNIPAQUE IOHEXOL 300 MG/ML  SOLN  COMPARISON:  09/02/2008.  FINDINGS: 10 mm low-attenuation lesion in the right lobe of the thyroid is incidentally noted. Bulky mediastinal adenopathy measures up to 2.5 x 2.6 cm in the lower right paratracheal station. Right hilar nodal mass is contiguous with the subcarinal region, collectively measuring 4.4 x 6.2 cm. No left hilar axillary or supraclavicular adenopathy. Atherosclerotic calcification of the arterial vasculature, including three-vessel involvement of the coronary arteries. Heart size normal. No pericardial effusion.  Centrilobular and paraseptal emphysema. Nodules are seen in the right lower lobe with the largest subpleural nodule in the right lower lobe measuring 1.1 x 2.5 cm (series 3, image 35). Pleural nodularity is seen in the right hemi thorax, with a basilar predominance. Largest pleural or extrapleural nodule is seen in the posterior medial inferior right hemi thorax, measuring 1.6 x 2.5 cm (image 47). Left lung is clear. No pleural fluid. There is narrowing of the bronchus intermedius. Airway is otherwise unremarkable.  Incidental imaging of the upper abdomen shows intermediate density lesions in the liver measuring up to 3.1 x 3.2 cm in the left hepatic lobe, new. Cholecystectomy with stable biliary ductal dilatation. Right adrenal nodularity measures up to 1.3 x 1.3 cm, stable from 09/02/2008. Visualized portions of the left adrenal gland, kidneys, spleen, pancreas, stomach and bowel are otherwise grossly unremarkable. No upper abdominal adenopathy.  A lucent lesion in the T8 vertebral body measures 2.1 x 2.5 cm, with associated compression deformity. Sclerosis and probable hemangioma in the T11 vertebral body are unchanged.  IMPRESSION: 1. Stage IV primary bronchogenic carcinoma as evidenced by bulky  mediastinal and right hilar adenopathy, right lower lobe nodules, right pleural/extrapleural nodularity, new hepatic lesions and new T8 lytic lesion. 2. Pathologic T8 compression fracture. 3. Bulky right hilar nodal mass results in narrowing of the bronchus intermedius. 4. Right adrenal nodularity is unchanged from 09/02/2008, indicating benign adrenal adenomas. 5. Three-vessel coronary artery calcification.   Electronically Signed   By: Lorin Picket M.D.   On: 03/02/2014 11:25     ASSESSMENT/PLAN:  No problem-specific assessment & plan notes found  for this encounter. patient is a very pleasant 73 year old Caucasian female recently diagnosed with extensive stage, stage IV (T3, N2, M1 B) also lung cancer diagnosed in October 2015 presenting with bulky mediastinal lymphadenopathy in addition to right lower lobe nodules right pleural nodularity as well as metastatic bone lesion at T8. She is status post one cycle of systemic chemotherapy with carboplatin for an AUC of 5 given on day 1, and etoposide at 100 mg/m given on days 1, 2 and 3 with Neulasta support given on day 4. Overall she tolerated her first cycle relatively well with the exception of some nausea, mild vomiting and some constipation requiring magnesium citrate. She is neutropenic today with a total white count of 0.7 and ANC of 0.1. She is afebrile. Neutropenic precautions were reviewed with the patient and her husband and all voiced understanding. We will place her on a course of Cipro at 500 mg twice daily for the next 5 days due to her neutropenia. Patient is cautioned to contact the physician on-call and getting the nearest emergency room if her temperature reaches 100.5 or higher. She will continue with weekly labs as scheduled. She'll follow-up in 2 weeks prior to the start of cycle #2 of her systemic chemotherapy with carboplatin and etoposide with Neulasta support.  Awilda Metro E, PA-C 03/28/2014 All questions were answered. The  patient knows to call the clinic with any problems, questions or concerns. We can certainly see the patient much sooner if necessary.  ADDENDUM: Hematology/Oncology Attending: I had a face to face encounter with the patient. I recommended her care plan. This is a very pleasant 73 years old white female recently diagnosed with extensive stage small cell lung cancer and currently undergoing systemic chemotherapy with carboplatin and etoposide status post 1 cycle. The patient tolerated the first cycle of her treatment fairly well with no significant adverse effects. Her CBC today showed leukocytopenia and neutropenia but the patient is afebrile. We will start her on a prophylactic course of Cipro 500 mg by mouth twice a day for the next 5 days. She was advised to call immediately if she develop any fever or chills. The patient would come back for follow-up visit in 2 weeks with the start of cycle #2. She was advised to call immediately if she has any concerning symptoms in the interval.  Disclaimer: This note was dictated with voice recognition software. Similar sounding words can inadvertently be transcribed and may be missed upon review. Eilleen Kempf., MD 04/01/2014

## 2014-03-29 ENCOUNTER — Telehealth: Payer: Self-pay | Admitting: Internal Medicine

## 2014-03-30 ENCOUNTER — Other Ambulatory Visit: Payer: Self-pay | Admitting: Radiology

## 2014-03-30 ENCOUNTER — Telehealth: Payer: Self-pay | Admitting: *Deleted

## 2014-03-30 NOTE — Telephone Encounter (Signed)
Per staff message and POF I have scheduled appts. Advised scheduler of appts. JMW  

## 2014-03-30 NOTE — Patient Instructions (Signed)
Continue weekly labs as scheduled Take the Cipro as instructed Follow-up in 2 weeks prior to the start of your next scheduled cycle of chemotherapy

## 2014-03-31 ENCOUNTER — Encounter (HOSPITAL_COMMUNITY)
Admission: RE | Admit: 2014-03-31 | Discharge: 2014-03-31 | Disposition: A | Discharge status: Home / Self Care | Payer: Medicare Other | Source: Ambulatory Visit | Attending: Internal Medicine | Admitting: Internal Medicine

## 2014-03-31 ENCOUNTER — Other Ambulatory Visit: Payer: Self-pay | Admitting: Radiology

## 2014-03-31 ENCOUNTER — Encounter (HOSPITAL_COMMUNITY): Payer: Self-pay

## 2014-03-31 ENCOUNTER — Ambulatory Visit (HOSPITAL_COMMUNITY)
Admission: RE | Admit: 2014-03-31 | Discharge: 2014-03-31 | Disposition: A | Discharge status: Home / Self Care | Payer: Medicare Other | Source: Ambulatory Visit | Attending: Internal Medicine | Admitting: Internal Medicine

## 2014-03-31 DIAGNOSIS — Z90710 Acquired absence of both cervix and uterus: Secondary | ICD-10-CM | POA: Diagnosis not present

## 2014-03-31 DIAGNOSIS — K769 Liver disease, unspecified: Secondary | ICD-10-CM | POA: Insufficient documentation

## 2014-03-31 DIAGNOSIS — C3490 Malignant neoplasm of unspecified part of unspecified bronchus or lung: Secondary | ICD-10-CM | POA: Insufficient documentation

## 2014-03-31 DIAGNOSIS — N281 Cyst of kidney, acquired: Secondary | ICD-10-CM | POA: Diagnosis not present

## 2014-03-31 DIAGNOSIS — C787 Secondary malignant neoplasm of liver and intrahepatic bile duct: Secondary | ICD-10-CM | POA: Insufficient documentation

## 2014-03-31 DIAGNOSIS — C3491 Malignant neoplasm of unspecified part of right bronchus or lung: Secondary | ICD-10-CM

## 2014-03-31 DIAGNOSIS — R599 Enlarged lymph nodes, unspecified: Secondary | ICD-10-CM | POA: Diagnosis not present

## 2014-03-31 DIAGNOSIS — I700 Atherosclerosis of aorta: Secondary | ICD-10-CM | POA: Insufficient documentation

## 2014-03-31 HISTORY — DX: Malignant neoplasm of unspecified part of unspecified bronchus or lung: C34.90

## 2014-03-31 LAB — GLUCOSE, CAPILLARY: Glucose-Capillary: 114 mg/dL — ABNORMAL HIGH (ref 70–99)

## 2014-03-31 MED ORDER — FLUDEOXYGLUCOSE F - 18 (FDG) INJECTION
7.2000 | Freq: Once | INTRAVENOUS | Status: AC | PRN
  Administered 2014-03-31: 7.2 via INTRAVENOUS

## 2014-03-31 MED ORDER — GADOBENATE DIMEGLUMINE 529 MG/ML IV SOLN
13.0000 mL | Freq: Once | INTRAVENOUS | Status: AC | PRN
  Administered 2014-03-31: 13 mL via INTRAVENOUS

## 2014-04-03 ENCOUNTER — Other Ambulatory Visit: Payer: Self-pay | Admitting: Physician Assistant

## 2014-04-03 ENCOUNTER — Inpatient Hospital Stay (HOSPITAL_COMMUNITY): Admission: RE | Admit: 2014-04-03 | Payer: Medicare Other | Source: Ambulatory Visit

## 2014-04-03 ENCOUNTER — Ambulatory Visit (HOSPITAL_COMMUNITY): Admission: RE | Admit: 2014-04-03 | Payer: Medicare Other | Source: Ambulatory Visit

## 2014-04-03 ENCOUNTER — Encounter: Payer: Self-pay | Admitting: Physician Assistant

## 2014-04-03 ENCOUNTER — Ambulatory Visit (HOSPITAL_COMMUNITY)
Admission: RE | Admit: 2014-04-03 | Discharge: 2014-04-03 | Disposition: A | Discharge status: Home / Self Care | Payer: Medicare Other | Source: Ambulatory Visit | Attending: Physician Assistant | Admitting: Physician Assistant

## 2014-04-03 ENCOUNTER — Encounter (HOSPITAL_COMMUNITY): Payer: Self-pay

## 2014-04-03 DIAGNOSIS — K219 Gastro-esophageal reflux disease without esophagitis: Secondary | ICD-10-CM | POA: Insufficient documentation

## 2014-04-03 DIAGNOSIS — E785 Hyperlipidemia, unspecified: Secondary | ICD-10-CM | POA: Insufficient documentation

## 2014-04-03 DIAGNOSIS — I878 Other specified disorders of veins: Secondary | ICD-10-CM

## 2014-04-03 DIAGNOSIS — Z87891 Personal history of nicotine dependence: Secondary | ICD-10-CM | POA: Diagnosis not present

## 2014-04-03 DIAGNOSIS — D696 Thrombocytopenia, unspecified: Secondary | ICD-10-CM

## 2014-04-03 DIAGNOSIS — D72819 Decreased white blood cell count, unspecified: Secondary | ICD-10-CM | POA: Diagnosis not present

## 2014-04-03 DIAGNOSIS — C3490 Malignant neoplasm of unspecified part of unspecified bronchus or lung: Secondary | ICD-10-CM | POA: Insufficient documentation

## 2014-04-03 DIAGNOSIS — I10 Essential (primary) hypertension: Secondary | ICD-10-CM | POA: Insufficient documentation

## 2014-04-03 DIAGNOSIS — Z538 Procedure and treatment not carried out for other reasons: Secondary | ICD-10-CM | POA: Insufficient documentation

## 2014-04-03 DIAGNOSIS — C3491 Malignant neoplasm of unspecified part of right bronchus or lung: Secondary | ICD-10-CM

## 2014-04-03 LAB — CBC WITH DIFFERENTIAL/PLATELET
BASOS PCT: 1 % (ref 0–1)
Basophils Absolute: 0.1 10*3/uL (ref 0.0–0.1)
Eosinophils Absolute: 0 10*3/uL (ref 0.0–0.7)
Eosinophils Relative: 0 % (ref 0–5)
HEMATOCRIT: 36.1 % (ref 36.0–46.0)
HEMOGLOBIN: 12.5 g/dL (ref 12.0–15.0)
Lymphocytes Relative: 23 % (ref 12–46)
Lymphs Abs: 1.8 10*3/uL (ref 0.7–4.0)
MCH: 28.4 pg (ref 26.0–34.0)
MCHC: 34.6 g/dL (ref 30.0–36.0)
MCV: 82 fL (ref 78.0–100.0)
MONO ABS: 1.2 10*3/uL — AB (ref 0.1–1.0)
Monocytes Relative: 16 % — ABNORMAL HIGH (ref 3–12)
NEUTROS ABS: 4.7 10*3/uL (ref 1.7–7.7)
Neutrophils Relative %: 60 % (ref 43–77)
Platelets: 26 10*3/uL — CL (ref 150–400)
RBC: 4.4 MIL/uL (ref 3.87–5.11)
RDW: 13.1 % (ref 11.5–15.5)
WBC: 7.8 10*3/uL (ref 4.0–10.5)

## 2014-04-03 LAB — PROTIME-INR
INR: 1.25 (ref 0.00–1.49)
Prothrombin Time: 15.8 seconds — ABNORMAL HIGH (ref 11.6–15.2)

## 2014-04-03 MED ORDER — LIDOCAINE-EPINEPHRINE (PF) 1 %-1:200000 IJ SOLN
INTRAMUSCULAR | Status: AC
  Filled 2014-04-03: qty 10

## 2014-04-03 MED ORDER — VANCOMYCIN HCL IN DEXTROSE 1-5 GM/200ML-% IV SOLN
1000.0000 mg | Freq: Once | INTRAVENOUS | Status: DC
  Filled 2014-04-03: qty 200

## 2014-04-03 MED ORDER — SODIUM CHLORIDE 0.9 % IV SOLN
INTRAVENOUS | Status: DC
  Administered 2014-04-03: 08:00:00 via INTRAVENOUS

## 2014-04-03 MED ORDER — LIDOCAINE HCL 1 % IJ SOLN
INTRAMUSCULAR | Status: AC
  Filled 2014-04-03: qty 20

## 2014-04-03 NOTE — H&P (Signed)
Chief Complaint: "I'm here for a port" Referring Physician:Mohamed HPI: [name] is an 73 y.o. female with lung cancer who has been receiving treatment but has been having difficulty with peripheral access. She is now scheduled for port placement PMHx, meds, labs, imaging reviewed. Has been NPO this am Denies recent fevers, illness. Developed some leukopenia after treatment and was put on 5 days of Cipro as a precaution, but denies any infectious sxs.   Past Medical History:  Past Medical History  Diagnosis Date  . GERD (gastroesophageal reflux disease)   . IBS (irritable bowel syndrome)   . Adenomatous colon polyp 06/2009  . Hiatal hernia   . Breast cyst   . Diverticulosis   . Hemorrhoids   . Hyperlipidemia   . Hypertension   . Hyperparathyroidism   . Anxiety   . Migraine   . Osteoporosis   . Vitamin D deficiency   . Spinal stenosis   . C. difficile colitis 2006  . Small cell lung carcinoma     Past Surgical History:  Past Surgical History  Procedure Laterality Date  . Cholecystectomy    . Vaginal hysterectomy    . Knee arthroscopy      left  . Parathyroidectomy  2009    [facility]  . Lumbar spine surgery  2001  . Breast biopsy      bilateral  . Total knee arthroplasty      right  . Bladder repair    . Video bronchoscopy Bilateral 03/14/2014    Procedure: VIDEO BRONCHOSCOPY WITHOUT FLUORO;  Surgeon: Tanda Rockers, MD;  Location: [facility];  Service: Cardiopulmonary;  Laterality: Bilateral;    Family History:  Family History  Problem Relation Age of Onset  . Colon polyps Sister   . Irritable bowel syndrome Sister   . Breast cancer Sister   . Diabetes Sister     X 3  . Diabetes Brother   . Colon cancer Neg Hx     Social History:  reports that she quit smoking about 10 months ago. Her smoking use included Cigarettes. She has a 6.25 pack-year smoking history. She has never used smokeless tobacco. She reports that she does not drink alcohol or use  illicit drugs.  Allergies:  Allergies  Allergen Reactions  . Ace Inhibitors     REACTION: causing cough  . Cephalexin Rash  . Etodolac Rash  . Gabapentin Rash    Medications:   Medication List    ASK your doctor about these medications        ALPRAZolam 0.5 MG tablet  Commonly known as:  XANAX  Take 0.5 mg by mouth at bedtime.     atorvastatin 80 MG tablet  Commonly known as:  LIPITOR  Take 80 mg by mouth daily.     buPROPion 150 MG 12 hr tablet  Commonly known as:  WELLBUTRIN SR  Take 150 mg by mouth daily.     ciprofloxacin 500 MG tablet  Commonly known as:  CIPRO  Take 1 tablet (500 mg total) by mouth 2 (two) times daily.     denosumab 60 MG/ML Soln injection  Commonly known as:  PROLIA  Inject 60 mg into the skin every 6 (six) months. Administer in upper arm, thigh, or abdomen     dexlansoprazole 60 MG capsule  Commonly known as:  DEXILANT  TAKE 1 CAPSULE (60 MG TOTAL) BY MOUTH DAILY.     glycopyrrolate 2 MG tablet  Commonly known as:  ROBINUL  TAKE 1 TABLET (2 MG TOTAL) BY MOUTH 2 (TWO) TIMES DAILY.     hydrochlorothiazide 12.5 MG tablet  Commonly known as:  HYDRODIURIL  Take 1 tablet by mouth daily.     HYDROcodone-acetaminophen 5-325 MG per tablet  Commonly known as:  NORCO/VICODIN  Take 1 tablet by mouth every 4 (four) hours as needed for moderate pain.     HYDROcodone-homatropine 5-1.5 MG/5ML syrup  Commonly known as:  HYCODAN  Take 5 mLs by mouth every 6 (six) hours as needed for cough.     levofloxacin 500 MG tablet  Commonly known as:  LEVAQUIN  Take 500 mg by mouth daily.     metoprolol succinate 50 MG 24 hr tablet  Commonly known as:  TOPROL-XL  Take 50 mg by mouth daily.     MIRALAX PO  Take 1 scoop by mouth daily as needed (for constipation).     oxycodone 5 MG capsule  Commonly known as:  OXY-IR  1-2 every 4 hours as needed for back pain     pregabalin 50 MG capsule  Commonly known as:  LYRICA  Take 50 mg by mouth daily.      prochlorperazine 10 MG tablet  Commonly known as:  COMPAZINE  Take 1 tablet (10 mg total) by mouth every 6 (six) hours as needed for nausea or vomiting.     traMADol 50 MG tablet  Commonly known as:  ULTRAM  Take 50 mg by mouth 3 (three) times daily as needed for moderate pain.     ZORVOLEX 35 MG Caps  Generic drug:  Diclofenac  Take 35 mg by mouth. Take 1 daily by mouth.        Please HPI for pertinent positives, otherwise complete 10 system ROS negative.  Physical Exam: BP 169/88 mmHg  Pulse 113  Temp(Src) 97.5 F (36.4 C) (Oral)  Resp 18  Ht 5\' 5"  (1.651 m)  Wt 147 lb (66.679 kg)  BMI 24.46 kg/m2  SpO2 98% Body mass index is 24.46 kg/(m^2).   General Appearance:  Alert, cooperative, no distress, appears stated age  Head:  Normocephalic, without obvious abnormality, atraumatic  ENT: Unremarkable  Neck: Supple, symmetrical, trachea midline  Lungs:   Clear to auscultation bilaterally, no w/r/r, respirations unlabored without use of accessory muscles.  Chest Wall:  No tenderness or deformity  Heart:  Regular rate and rhythm, S1, S2 normal, no murmur, rub or gallop.  Neurologic: Normal affect, no gross deficits.  Labs: No results found for this or any previous visit (from the past 48 hour(s)).   Assessment/Plan Lung cancer For Port placement today Explained procedure, risks, complications, use of sedation. Labs pending, hopefully WBC has rebounded Consent signed in chart   Ascencion Dike PA-C 04/03/2014, 8:19 AM

## 2014-04-03 NOTE — Progress Notes (Signed)
Received a call from Dr. Markus Daft from interventional radiology regarding [name]. Her white count had recovered however her platelets have decreased to 25,000. She was scheduled a Port-A-Cath placed today. We will postpone the placement of the Port-A-Cath until her platelets have recovered some. We'll schedule the patient have a repeat CBC differential done on 04/05/2014. As long as her platelets have recovered sufficiently to safely proceed with Port-A-Cath placement of the procedure will be rescheduled for either 1119 or 04/07/2014.  [name], Mandy Durrett E, PA-C

## 2014-04-04 ENCOUNTER — Telehealth: Payer: Self-pay | Admitting: Medical Oncology

## 2014-04-04 ENCOUNTER — Other Ambulatory Visit (HOSPITAL_BASED_OUTPATIENT_CLINIC_OR_DEPARTMENT_OTHER): Payer: Medicare Other

## 2014-04-04 DIAGNOSIS — D696 Thrombocytopenia, unspecified: Secondary | ICD-10-CM

## 2014-04-04 DIAGNOSIS — C342 Malignant neoplasm of middle lobe, bronchus or lung: Secondary | ICD-10-CM

## 2014-04-04 LAB — CBC WITH DIFFERENTIAL/PLATELET
BASO%: 0.4 % (ref 0.0–2.0)
BASOS ABS: 0 10*3/uL (ref 0.0–0.1)
EOS ABS: 0 10*3/uL (ref 0.0–0.5)
EOS%: 0.1 % (ref 0.0–7.0)
HEMATOCRIT: 37.1 % (ref 34.8–46.6)
HEMOGLOBIN: 12.7 g/dL (ref 11.6–15.9)
LYMPH#: 1.9 10*3/uL (ref 0.9–3.3)
LYMPH%: 18.9 % (ref 14.0–49.7)
MCH: 28.1 pg (ref 25.1–34.0)
MCHC: 34.2 g/dL (ref 31.5–36.0)
MCV: 82.1 fL (ref 79.5–101.0)
MONO#: 1.3 10*3/uL — ABNORMAL HIGH (ref 0.1–0.9)
MONO%: 12.3 % (ref 0.0–14.0)
NEUT%: 68.3 % (ref 38.4–76.8)
NEUTROS ABS: 7 10*3/uL — AB (ref 1.5–6.5)
Platelets: 44 10*3/uL — ABNORMAL LOW (ref 145–400)
RBC: 4.52 10*6/uL (ref 3.70–5.45)
RDW: 13.2 % (ref 11.2–14.5)
WBC: 10.3 10*3/uL (ref 3.9–10.3)

## 2014-04-04 NOTE — Telephone Encounter (Signed)
Pt did not get port due to low platelets.

## 2014-04-07 ENCOUNTER — Telehealth: Payer: Self-pay

## 2014-04-07 ENCOUNTER — Telehealth: Payer: Self-pay | Admitting: *Deleted

## 2014-04-07 MED ORDER — OXYCODONE HCL 5 MG PO CAPS: ORAL_CAPSULE | ORAL | Status: DC

## 2014-04-07 NOTE — Telephone Encounter (Signed)
MW please clarify your message below. Thanks.

## 2014-04-07 NOTE — Telephone Encounter (Signed)
Called and spoke to pt. Informed pt of the refill. Pt aware rx has been left up front for pick up. Nothing further needed.

## 2014-04-07 NOTE — Telephone Encounter (Signed)
Fine for one refill #90 and ok for daughter to pick up as this is a cancer pt and further refills per oncology

## 2014-04-07 NOTE — Telephone Encounter (Signed)
No problem x one but all further refills should be thur the underlying problem as one way to know the treatment is working is that the need for pain meds goes down (she should discuss with him at next ov)

## 2014-04-07 NOTE — Telephone Encounter (Signed)
Dr Melvyn Novas, is this okay to refill for her  Last given on 03/14/14 # 80 with no rf  Please advise, thanks!

## 2014-04-07 NOTE — Telephone Encounter (Signed)
Pt is asking to pick this up today.  Would like her niece, Consuello Masse, will p/u written Rx if necessary.  Pt can be reached at [phone].  Satira Anis

## 2014-04-07 NOTE — Telephone Encounter (Signed)
Received phone call from patient stating she needed a refill on her oxycodone.  Informed patient that Dr. Melvyn Novas at [facility] prescribed that medication.  Patient stated she would call Dr. Melvyn Novas for a refill.

## 2014-04-11 ENCOUNTER — Ambulatory Visit (HOSPITAL_BASED_OUTPATIENT_CLINIC_OR_DEPARTMENT_OTHER): Payer: Medicare Other

## 2014-04-11 ENCOUNTER — Other Ambulatory Visit (HOSPITAL_BASED_OUTPATIENT_CLINIC_OR_DEPARTMENT_OTHER): Payer: Medicare Other

## 2014-04-11 ENCOUNTER — Other Ambulatory Visit: Payer: Medicare Other

## 2014-04-11 ENCOUNTER — Ambulatory Visit (HOSPITAL_BASED_OUTPATIENT_CLINIC_OR_DEPARTMENT_OTHER): Payer: Medicare Other | Admitting: Internal Medicine

## 2014-04-11 ENCOUNTER — Telehealth: Payer: Self-pay | Admitting: *Deleted

## 2014-04-11 ENCOUNTER — Encounter: Payer: Self-pay | Admitting: Internal Medicine

## 2014-04-11 ENCOUNTER — Telehealth: Payer: Self-pay | Admitting: Internal Medicine

## 2014-04-11 VITALS — BP 139/64 | HR 105 | Temp 97.4°F | Resp 18 | Ht 65.0 in | Wt 139.5 lb

## 2014-04-11 DIAGNOSIS — C7951 Secondary malignant neoplasm of bone: Secondary | ICD-10-CM

## 2014-04-11 DIAGNOSIS — C3491 Malignant neoplasm of unspecified part of right bronchus or lung: Secondary | ICD-10-CM

## 2014-04-11 DIAGNOSIS — C342 Malignant neoplasm of middle lobe, bronchus or lung: Secondary | ICD-10-CM

## 2014-04-11 DIAGNOSIS — Z5111 Encounter for antineoplastic chemotherapy: Secondary | ICD-10-CM

## 2014-04-11 LAB — COMPREHENSIVE METABOLIC PANEL (CC13)
ALBUMIN: 3.5 g/dL (ref 3.5–5.0)
ALT: 8 U/L (ref 0–55)
AST: 15 U/L (ref 5–34)
Alkaline Phosphatase: 243 U/L — ABNORMAL HIGH (ref 40–150)
Anion Gap: 11 mEq/L (ref 3–11)
BILIRUBIN TOTAL: 0.73 mg/dL (ref 0.20–1.20)
BUN: 16.6 mg/dL (ref 7.0–26.0)
CO2: 28 mEq/L (ref 22–29)
Calcium: 9.7 mg/dL (ref 8.4–10.4)
Chloride: 98 mEq/L (ref 98–109)
Creatinine: 1 mg/dL (ref 0.6–1.1)
Glucose: 100 mg/dl (ref 70–140)
POTASSIUM: 3.5 meq/L (ref 3.5–5.1)
Sodium: 138 mEq/L (ref 136–145)
Total Protein: 6.8 g/dL (ref 6.4–8.3)

## 2014-04-11 LAB — CBC WITH DIFFERENTIAL/PLATELET
BASO%: 1.3 % (ref 0.0–2.0)
Basophils Absolute: 0.1 10*3/uL (ref 0.0–0.1)
EOS%: 0.1 % (ref 0.0–7.0)
Eosinophils Absolute: 0 10*3/uL (ref 0.0–0.5)
HCT: 34.6 % — ABNORMAL LOW (ref 34.8–46.6)
HEMOGLOBIN: 11.7 g/dL (ref 11.6–15.9)
LYMPH%: 13.4 % — ABNORMAL LOW (ref 14.0–49.7)
MCH: 27.6 pg (ref 25.1–34.0)
MCHC: 33.7 g/dL (ref 31.5–36.0)
MCV: 81.9 fL (ref 79.5–101.0)
MONO#: 1.3 10*3/uL — ABNORMAL HIGH (ref 0.1–0.9)
MONO%: 15.8 % — ABNORMAL HIGH (ref 0.0–14.0)
NEUT#: 5.6 10*3/uL (ref 1.5–6.5)
NEUT%: 69.4 % (ref 38.4–76.8)
PLATELETS: 391 10*3/uL (ref 145–400)
RBC: 4.22 10*6/uL (ref 3.70–5.45)
RDW: 14 % (ref 11.2–14.5)
WBC: 8 10*3/uL (ref 3.9–10.3)
lymph#: 1.1 10*3/uL (ref 0.9–3.3)

## 2014-04-11 MED ORDER — SODIUM CHLORIDE 0.9 % IV SOLN
Freq: Once | INTRAVENOUS | Status: AC
  Administered 2014-04-11: 11:00:00 via INTRAVENOUS

## 2014-04-11 MED ORDER — ONDANSETRON 16 MG/50ML IVPB (CHCC)
INTRAVENOUS | Status: AC
Start: 2014-04-11 — End: 2014-04-11
  Filled 2014-04-11: qty 16

## 2014-04-11 MED ORDER — ONDANSETRON 16 MG/50ML IVPB (CHCC)
16.0000 mg | Freq: Once | INTRAVENOUS | Status: AC
  Administered 2014-04-11: 16 mg via INTRAVENOUS

## 2014-04-11 MED ORDER — SODIUM CHLORIDE 0.9 % IV SOLN
100.0000 mg/m2 | Freq: Once | INTRAVENOUS | Status: AC
  Administered 2014-04-11: 180 mg via INTRAVENOUS
  Filled 2014-04-11: qty 9

## 2014-04-11 MED ORDER — DEXAMETHASONE SODIUM PHOSPHATE 20 MG/5ML IJ SOLN
20.0000 mg | Freq: Once | INTRAMUSCULAR | Status: AC
  Administered 2014-04-11: 20 mg via INTRAVENOUS

## 2014-04-11 MED ORDER — DEXAMETHASONE SODIUM PHOSPHATE 20 MG/5ML IJ SOLN
INTRAMUSCULAR | Status: AC
  Filled 2014-04-11: qty 5

## 2014-04-11 MED ORDER — LIDOCAINE-PRILOCAINE 2.5-2.5 % EX CREA
1.0000 | TOPICAL_CREAM | CUTANEOUS | Status: AC | PRN
Start: 2014-04-11 — End: ?

## 2014-04-11 MED ORDER — LORAZEPAM 0.5 MG PO TABS
0.5000 mg | ORAL_TABLET | Freq: Three times a day (TID) | ORAL | Status: DC
Start: 2014-04-11 — End: 2014-05-23

## 2014-04-11 MED ORDER — SODIUM CHLORIDE 0.9 % IV SOLN
400.0000 mg | Freq: Once | INTRAVENOUS | Status: AC
  Administered 2014-04-11: 400 mg via INTRAVENOUS
  Filled 2014-04-11: qty 40

## 2014-04-11 NOTE — Progress Notes (Signed)
[name]   [phone]) [phone]  OFFICE PROGRESS NOTE  Mandy Panda, MD [address] [place] [address]  DIAGNOSIS: Extensive stage, stage IV (T3, N2, M1b) small cell lung cancer diagnosed in October 2015.  PRIOR THERAPY: None  CURRENT THERAPY: Systemic chemotherapy with carboplatin for AUC of 5 on day 1 and etoposide 100 MG/M2 on days 1, 2 and 3 with Neulasta support on day 4. Status post one cycle.  INTERVAL HISTORY: [name] 73 y.o. female returns to the clinic today for follow-up visit accompanied by her daughter [name]. The patient tolerated the first cycle of her systemic chemotherapy with carboplatin and etoposide fairly well except for mild fatigue. She also has alopecia and requested prescription for cranial prosthesis. She denied having any significant nausea or vomiting, no fever or chills. The patient denied having any significant chest pain but continues to have shortness breath with exertion was no cough or hemoptysis. She is here today to start cycle #2 of her systemic chemotherapy. She also has some shaking and tremors all over her body started after the first cycle of her chemotherapy.   MEDICAL HISTORY: Past Medical History  Diagnosis Date  . GERD (gastroesophageal reflux disease)   . IBS (irritable bowel syndrome)   . Adenomatous colon polyp 06/2009  . Hiatal hernia   . Breast cyst   . Diverticulosis   . Hemorrhoids   . Hyperlipidemia   . Hypertension   . Hyperparathyroidism   . Anxiety   . Migraine   . Osteoporosis   . Vitamin D deficiency   . Spinal stenosis   . C. difficile colitis 2006  . Small cell lung carcinoma     ALLERGIES:  is allergic to ace inhibitors; cephalexin; etodolac; and gabapentin.  MEDICATIONS:  Current Outpatient Prescriptions  Medication Sig Dispense Refill  . ALPRAZolam (XANAX) 0.5 MG tablet Take 0.5 mg by mouth at bedtime.      Marland Kitchen atorvastatin (LIPITOR) 80 MG tablet Take 80  mg by mouth daily.      Marland Kitchen buPROPion (WELLBUTRIN SR) 150 MG 12 hr tablet Take 150 mg by mouth daily.      Marland Kitchen denosumab (PROLIA) 60 MG/ML SOLN injection Inject 60 mg into the skin every 6 (six) months. Administer in upper arm, thigh, or abdomen    . dexlansoprazole (DEXILANT) 60 MG capsule TAKE 1 CAPSULE (60 MG TOTAL) BY MOUTH DAILY. 30 capsule 11  . Diclofenac (ZORVOLEX) 35 MG CAPS Take 35 mg by mouth. Take 1 daily by mouth.    Marland Kitchen glycopyrrolate (ROBINUL) 2 MG tablet TAKE 1 TABLET (2 MG TOTAL) BY MOUTH 2 (TWO) TIMES DAILY. 60 tablet 11  . hydrochlorothiazide (HYDRODIURIL) 12.5 MG tablet Take 1 tablet by mouth daily.    Marland Kitchen HYDROcodone-acetaminophen (NORCO/VICODIN) 5-325 MG per tablet Take 1 tablet by mouth every 4 (four) hours as needed for moderate pain.     . metoprolol (TOPROL-XL) 50 MG 24 hr tablet Take 50 mg by mouth daily.      Marland Kitchen oxycodone (OXY-IR) 5 MG capsule 1-2 every 4 hours as needed for back pain 90 capsule 0  . Polyethylene Glycol 3350 (MIRALAX PO) Take 1 scoop by mouth daily as needed (for constipation).     . pregabalin (LYRICA) 50 MG capsule Take 50 mg by mouth daily.    . prochlorperazine (COMPAZINE) 10 MG tablet Take 1 tablet (10 mg total) by mouth every 6 (six) hours as needed for nausea or vomiting.  30 tablet 1  . traMADol (ULTRAM) 50 MG tablet Take 50 mg by mouth 3 (three) times daily as needed for moderate pain.     . ciprofloxacin (CIPRO) 500 MG tablet Take 1 tablet (500 mg total) by mouth 2 (two) times daily. (Patient not taking: Reported on 04/11/2014) 10 tablet 0   No current facility-administered medications for this visit.    SURGICAL HISTORY:  Past Surgical History  Procedure Laterality Date  . Cholecystectomy    . Vaginal hysterectomy    . Knee arthroscopy      left  . Parathyroidectomy  2009    [facility]  . Lumbar spine surgery  2001  . Breast biopsy      bilateral  . Total knee arthroplasty      right  . Bladder repair    . Video bronchoscopy Bilateral  03/14/2014    Procedure: VIDEO BRONCHOSCOPY WITHOUT FLUORO;  Surgeon: Tanda Rockers, MD;  Location: [facility];  Service: Cardiopulmonary;  Laterality: Bilateral;    REVIEW OF SYSTEMS:  Constitutional: positive for anorexia, fatigue and weight loss Eyes: negative Ears, nose, mouth, throat, and face: negative Respiratory: positive for dyspnea on exertion Cardiovascular: negative Gastrointestinal: negative Genitourinary:negative Integument/breast: negative Hematologic/lymphatic: negative Musculoskeletal:negative Neurological: positive for tremors Behavioral/Psych: negative Endocrine: negative Allergic/Immunologic: negative   PHYSICAL EXAMINATION: General appearance: alert, cooperative, fatigued and no distress Head: Normocephalic, without obvious abnormality, atraumatic Neck: no adenopathy, no JVD, supple, symmetrical, trachea midline and thyroid not enlarged, symmetric, no tenderness/mass/nodules Lymph nodes: Cervical, supraclavicular, and axillary nodes normal. Resp: clear to auscultation bilaterally Back: symmetric, no curvature. ROM normal. No CVA tenderness. Cardio: regular rate and rhythm, S1, S2 normal, no murmur, click, rub or gallop GI: soft, non-tender; bowel sounds normal; no masses,  no organomegaly Extremities: extremities normal, atraumatic, no cyanosis or edema Neurologic: Alert and oriented X 3, normal strength and tone. Normal symmetric reflexes. Normal coordination and gait  ECOG PERFORMANCE STATUS: 1 - Symptomatic but completely ambulatory  Blood pressure 139/64, pulse 105, temperature 97.4 F (36.3 C), temperature source Oral, resp. rate 18, height 5\' 5"  (1.651 m), weight 139 lb 8 oz (63.277 kg), SpO2 97 %.  LABORATORY DATA: Lab Results  Component Value Date   WBC 8.0 04/11/2014   HGB 11.7 04/11/2014   HCT 34.6* 04/11/2014   MCV 81.9 04/11/2014   PLT 391 04/11/2014      Chemistry      Component Value Date/Time   NA 131* 03/28/2014 1211   NA 130*  02/02/2010 0500   K 4.0 03/28/2014 1211   K 3.7 02/02/2010 0500   CL 95* 02/02/2010 0500   CO2 28 03/28/2014 1211   CO2 27 02/02/2010 0500   BUN 22.6 03/28/2014 1211   BUN 8 02/02/2010 0500   CREATININE 1.0 03/28/2014 1211   CREATININE 0.86 02/02/2010 0500      Component Value Date/Time   CALCIUM 9.3 03/28/2014 1211   CALCIUM 8.4 02/02/2010 0500   ALKPHOS 98 03/28/2014 1211   ALKPHOS 112 01/25/2010 1100   AST 24 03/28/2014 1211   AST 27 01/25/2010 1100   ALT 14 03/28/2014 1211   ALT 33 01/25/2010 1100   BILITOT 1.34* 03/28/2014 1211   BILITOT 0.9 01/25/2010 1100       RADIOGRAPHIC STUDIES: Mr Jeri Cos QI Contrast  30-Apr-2014   CLINICAL DATA:  New diagnosis small cell lung cancer. Some confusion. Staging. Some headache.  EXAM: MRI HEAD WITHOUT AND WITH CONTRAST  TECHNIQUE: Multiplanar, multiecho pulse sequences of  the brain and surrounding structures were obtained without and with intravenous contrast.  CONTRAST:  74mL MULTIHANCE GADOBENATE DIMEGLUMINE 529 MG/ML IV SOLN  COMPARISON:  None.  FINDINGS: Diffusion imaging does not show any acute or subacute infarction. There chronic small-vessel ischemic changes of the pons. There are old small vessel infarctions affecting the thalami, basal ganglia and cerebral hemispheric deep white matter. No cortical or large vessel territory infarction. There is no evidence of metastatic disease to the brain or leptomeninges. There are multiple enhancing foci within the skull, skullbase and upper cervical spine consistent with widespread osseous metastatic disease.  No hydrocephalus or extra-axial collection. No pituitary mass. No inflammatory sinus disease.  IMPRESSION: No evidence of metastatic disease to the brain itself. Chronic small-vessel changes as outlined above.  Innumerable small metastatic lesions throughout the calvarium, skullbase and upper cervical spine. No evidence of extraosseous extension in this region.   Electronically Signed   By:  Nelson Chimes M.D.   On: 03/31/2014 11:28   Nm Pet Image Initial (pi) Skull Base To Thigh  03/31/2014   CLINICAL DATA:  Initial treatment strategy for small cell lung cancer.  EXAM: NUCLEAR MEDICINE PET SKULL BASE TO THIGH  TECHNIQUE: 7.2 mCi F-18 FDG was injected intravenously. Full-ring PET imaging was performed from the skull base to thigh after the radiotracer. CT data was obtained and used for attenuation correction and anatomic localization.  FASTING BLOOD GLUCOSE:  Value: 114 mg/dl  COMPARISON:  CT from 03/02/2014.  FINDINGS: NECK  No hypermetabolic lymph nodes in the neck.  CHEST  Mediastinum: Hypermetabolic right paratracheal, sub- carinal and right hilar lymph nodes are identified. Right paratracheal lymph node has an SUV max equal to 6.1. SUV max within the sub- carinal lymph node is equal to 6.0. The SUV max associated with the right hilar lymph node is equal to 6.8.  There is no pleural effusion. There are several pleural/ extrapleural nodules within the right lower lobe which are hypermetabolic. Index nodule within the posterior right lung base has an SUV max equal to 6.2.  ABDOMEN/PELVIS  Hepatobiliary: Multifocal hypermetabolic liver metastasis identified. The index lesion within the lateral segment of left lobe of liver measures 2.8 cm and has an SUV max equal to 7.7.  Spleen: The spleen appears within normal limits.  Pancreas: Unremarkable appearance of the pancreas.  Stomach/Bowel: Intense radiotracer uptake is identified within the distal esophagus at the level of the GE junction. The SUV max is equal to 6.8.  Urinary tract: The adrenal glands are both normal. Bilateral renal cysts are identified. Urinary bladder appears normal for degree of distention.  Vascular/Lymphatic: Calcified atherosclerotic disease involves the abdominal aorta. The aorta has a maximum AP diameter of 2.4 There is a small hypermetabolic right retrocrural lymph node. This has an SUV max equal to 5.4.  Reproductive: The  patient is status post hysterectomy. No hypermetabolic adnexal mass identified.  SKELETON  Extensive, heterogeneous abnormal hypermetabolism throughout the visualized axial and appendicular skeleton is noted consistent with widespread metastatic disease. Most of these areas of hyper metabolism do not have a corresponding CT abnormality. Intense FDG uptake associated with the lytic lesion involving the T9 vertebra is identified. This has an SUV max equal to 8.0. There is diffuse sclerosis involving the T12 vertebra. The SUV max within this vertebra is equal to 5.3. Lytic lesion within the right iliac bone has an SUV max equal to 6.6.  IMPRESSION: 1. There is evidence of hypermetabolic mediastinal and right hilar adenopathy as well  as hypermetabolic right lower lobe nodules. Multi focal liver metastasis and multifocal hypermetabolic bone metastasis is also identified. Findings are consistent with extensive stage small cell lung cancer. 2. Abnormal increased FDG uptake associated with the distal esophagus may reflect esophagitis. Tumor is not excluded.   Electronically Signed   By: Kerby Moors M.D.   On: 03/31/2014 10:44    ASSESSMENT AND PLAN: This is a very pleasant 73 years old white female with extensive stage small cell lung cancer currently undergoing systemic chemotherapy with carboplatin and etoposide status post 1 cycle. The patient is tolerating her treatment fairly well except for mild fatigue, alopecia and new tremors.  Her recent MRI of the brain showed no intra-parenchymal brain lesion but the patient has few osseous metastasis in the skull. The PET scan also showed the evidence for hypermetabolic mediastinal and right hilar adenopathy as well as hypermetabolic right lower lobe nodules in addition to multi focal liver lesions and multifocal bone metastasis. I discussed the scan results and showed the images to the patient and her daughter. I recommended for her to continue her current treatment  with carboplatin and etoposide as scheduled. She would proceed with cycle #2 today. She will come back for follow-up visit in 3 weeks after repeating CT scan of the chest, abdomen and pelvis for restaging of her disease. For the bone metastasis, I will start the patient on Xgeva 120 mcg subcutaneously every 4 weeks. The patient has no dental issues and currently has dentures. She was also advised to take calcium and vitamin D supplements on daily basis. For the tremors, I started the patient on Ativan 0.5 mg by mouth every 8 hours as needed. For the alopecia, I provided the patient was prescription for cranial prosthesis. The patient was advised to call immediately if she has any concerning symptoms in the interval. The patient voices understanding of current disease status and treatment options and is in agreement with the current care plan.  All questions were answered. The patient knows to call the clinic with any problems, questions or concerns. We can certainly see the patient much sooner if necessary.  I spent 20 minutes counseling the patient face to face. The total time spent in the appointment was 30 minutes.  Disclaimer: This note was dictated with voice recognition software. Similar sounding words can inadvertently be transcribed and may not be corrected upon review.

## 2014-04-11 NOTE — Patient Instructions (Signed)
[facility] Discharge Instructions for Patients Receiving Chemotherapy  Today you received the following chemotherapy agents: Carboplatin, Etoposide  To help prevent nausea and vomiting after your treatment, we encourage you to take your nausea medication as prescribed by your physician.   If you develop nausea and vomiting that is not controlled by your nausea medication, call the clinic.   BELOW ARE SYMPTOMS THAT SHOULD BE REPORTED IMMEDIATELY:  *FEVER GREATER THAN 100.5 F  *CHILLS WITH OR WITHOUT FEVER  NAUSEA AND VOMITING THAT IS NOT CONTROLLED WITH YOUR NAUSEA MEDICATION  *UNUSUAL SHORTNESS OF BREATH  *UNUSUAL BRUISING OR BLEEDING  TENDERNESS IN MOUTH AND THROAT WITH OR WITHOUT PRESENCE OF ULCERS  *URINARY PROBLEMS  *BOWEL PROBLEMS  UNUSUAL RASH Items with * indicate a potential emergency and should be followed up as soon as possible.  Feel free to call the clinic you have any questions or concerns. The clinic phone number is [phone].

## 2014-04-11 NOTE — Telephone Encounter (Signed)
Per staff message and POF I have scheduled appts. Advised scheduler of appts. JMW  

## 2014-04-11 NOTE — Telephone Encounter (Signed)
Gave avs & cal for Dec. Sent mess to sch tx. Sent mess to check about a portacath placement.

## 2014-04-12 ENCOUNTER — Ambulatory Visit (HOSPITAL_BASED_OUTPATIENT_CLINIC_OR_DEPARTMENT_OTHER): Payer: Medicare Other

## 2014-04-12 ENCOUNTER — Ambulatory Visit: Payer: Medicare Other

## 2014-04-12 DIAGNOSIS — Z5111 Encounter for antineoplastic chemotherapy: Secondary | ICD-10-CM

## 2014-04-12 DIAGNOSIS — C342 Malignant neoplasm of middle lobe, bronchus or lung: Secondary | ICD-10-CM

## 2014-04-12 DIAGNOSIS — C3491 Malignant neoplasm of unspecified part of right bronchus or lung: Secondary | ICD-10-CM

## 2014-04-12 MED ORDER — SODIUM CHLORIDE 0.9 % IV SOLN
100.0000 mg/m2 | Freq: Once | INTRAVENOUS | Status: AC
  Administered 2014-04-12: 180 mg via INTRAVENOUS
  Filled 2014-04-12: qty 9

## 2014-04-12 MED ORDER — SODIUM CHLORIDE 0.9 % IV SOLN
Freq: Once | INTRAVENOUS | Status: AC
  Administered 2014-04-12: 15:00:00 via INTRAVENOUS

## 2014-04-12 MED ORDER — PROCHLORPERAZINE MALEATE 10 MG PO TABS
ORAL_TABLET | ORAL | Status: AC
  Filled 2014-04-12: qty 1

## 2014-04-12 MED ORDER — DENOSUMAB 120 MG/1.7ML ~~LOC~~ SOLN: 120.0000 mg | Freq: Once | SUBCUTANEOUS | Status: DC

## 2014-04-12 MED ORDER — PROCHLORPERAZINE MALEATE 10 MG PO TABS
10.0000 mg | ORAL_TABLET | Freq: Once | ORAL | Status: AC
  Administered 2014-04-12: 10 mg via ORAL

## 2014-04-12 NOTE — Patient Instructions (Addendum)
[facility] Discharge Instructions for Patients Receiving Chemotherapy  Today you received the following chemotherapy agent: Etoposide   To help prevent nausea and vomiting after your treatment, we encourage you to take your nausea medication, Compazine. Take one every 6 hours as needed.   If you develop nausea and vomiting that is not controlled by your nausea medication, call the clinic.   BELOW ARE SYMPTOMS THAT SHOULD BE REPORTED IMMEDIATELY:  *FEVER GREATER THAN 100.5 F  *CHILLS WITH OR WITHOUT FEVER  NAUSEA AND VOMITING THAT IS NOT CONTROLLED WITH YOUR NAUSEA MEDICATION  *UNUSUAL SHORTNESS OF BREATH  *UNUSUAL BRUISING OR BLEEDING  TENDERNESS IN MOUTH AND THROAT WITH OR WITHOUT PRESENCE OF ULCERS  *URINARY PROBLEMS  *BOWEL PROBLEMS  UNUSUAL RASH Items with * indicate a potential emergency and should be followed up as soon as possible.  Feel free to call the clinic should you have any questions or concerns. The clinic phone number is [phone].

## 2014-04-12 NOTE — Progress Notes (Signed)
Delton See order released but not given. Per pharmacy, pt to receive on 11/27.

## 2014-04-14 ENCOUNTER — Ambulatory Visit: Payer: Medicare Other

## 2014-04-14 ENCOUNTER — Ambulatory Visit (HOSPITAL_BASED_OUTPATIENT_CLINIC_OR_DEPARTMENT_OTHER): Payer: Medicare Other

## 2014-04-14 DIAGNOSIS — C7951 Secondary malignant neoplasm of bone: Secondary | ICD-10-CM

## 2014-04-14 DIAGNOSIS — C3491 Malignant neoplasm of unspecified part of right bronchus or lung: Secondary | ICD-10-CM

## 2014-04-14 DIAGNOSIS — C342 Malignant neoplasm of middle lobe, bronchus or lung: Secondary | ICD-10-CM

## 2014-04-14 DIAGNOSIS — Z5111 Encounter for antineoplastic chemotherapy: Secondary | ICD-10-CM

## 2014-04-14 MED ORDER — DENOSUMAB 120 MG/1.7ML ~~LOC~~ SOLN
120.0000 mg | Freq: Once | SUBCUTANEOUS | Status: AC
  Administered 2014-04-14: 120 mg via SUBCUTANEOUS
  Filled 2014-04-14: qty 1.7

## 2014-04-14 MED ORDER — PROCHLORPERAZINE MALEATE 10 MG PO TABS
ORAL_TABLET | ORAL | Status: AC
  Filled 2014-04-14: qty 1

## 2014-04-14 MED ORDER — PROCHLORPERAZINE MALEATE 10 MG PO TABS
10.0000 mg | ORAL_TABLET | Freq: Once | ORAL | Status: AC
  Administered 2014-04-14: 10 mg via ORAL

## 2014-04-14 MED ORDER — SODIUM CHLORIDE 0.9 % IV SOLN
100.0000 mg/m2 | Freq: Once | INTRAVENOUS | Status: AC
  Administered 2014-04-14: 180 mg via INTRAVENOUS
  Filled 2014-04-14: qty 9

## 2014-04-14 MED ORDER — SODIUM CHLORIDE 0.9 % IV SOLN
Freq: Once | INTRAVENOUS | Status: AC
  Administered 2014-04-14: 16:00:00 via INTRAVENOUS

## 2014-04-14 NOTE — Patient Instructions (Signed)
[name] Discharge Instructions for Patients Receiving Chemotherapy  Today you received the following chemotherapy agents Etoposide.  To help prevent nausea and vomiting after your treatment, we encourage you to take your nausea medication as directed.    If you develop nausea and vomiting that is not controlled by your nausea medication, call the clinic.   BELOW ARE SYMPTOMS THAT SHOULD BE REPORTED IMMEDIATELY:  *FEVER GREATER THAN 100.5 F  *CHILLS WITH OR WITHOUT FEVER  NAUSEA AND VOMITING THAT IS NOT CONTROLLED WITH YOUR NAUSEA MEDICATION  *UNUSUAL SHORTNESS OF BREATH  *UNUSUAL BRUISING OR BLEEDING  TENDERNESS IN MOUTH AND THROAT WITH OR WITHOUT PRESENCE OF ULCERS  *URINARY PROBLEMS  *BOWEL PROBLEMS  UNUSUAL RASH Items with * indicate a potential emergency and should be followed up as soon as possible.  Feel free to call the clinic you have any questions or concerns. The clinic phone number is [phone].

## 2014-04-15 ENCOUNTER — Ambulatory Visit (HOSPITAL_BASED_OUTPATIENT_CLINIC_OR_DEPARTMENT_OTHER): Payer: Medicare Other

## 2014-04-15 DIAGNOSIS — C3491 Malignant neoplasm of unspecified part of right bronchus or lung: Secondary | ICD-10-CM

## 2014-04-15 DIAGNOSIS — Z5189 Encounter for other specified aftercare: Secondary | ICD-10-CM

## 2014-04-15 MED ORDER — PEGFILGRASTIM INJECTION 6 MG/0.6ML ~~LOC~~
6.0000 mg | PREFILLED_SYRINGE | Freq: Once | SUBCUTANEOUS | Status: AC
  Administered 2014-04-15: 6 mg via SUBCUTANEOUS

## 2014-04-17 ENCOUNTER — Telehealth: Payer: Self-pay | Admitting: *Deleted

## 2014-04-17 NOTE — Telephone Encounter (Addendum)
Pt's daughter [name] called left a message stating that pt was given a rx for ativan 1 week ago from Dr Vista Mink to help with her tremors.  She states that she has been taking x 1 week and it is not helping.  Per Dr Vista Mink, she will need to be referred to a neurologist to further assess.  Called [place] back, left msg

## 2014-04-17 NOTE — Telephone Encounter (Signed)
Spoke with [name], she thinks pt has a neurologist, she will check and call us back to let us know.

## 2014-04-24 ENCOUNTER — Other Ambulatory Visit: Payer: Self-pay | Admitting: Medical Oncology

## 2014-04-24 ENCOUNTER — Ambulatory Visit (HOSPITAL_BASED_OUTPATIENT_CLINIC_OR_DEPARTMENT_OTHER): Payer: Medicare Other

## 2014-04-24 ENCOUNTER — Ambulatory Visit (HOSPITAL_BASED_OUTPATIENT_CLINIC_OR_DEPARTMENT_OTHER): Payer: Medicare Other | Admitting: Nurse Practitioner

## 2014-04-24 ENCOUNTER — Telehealth: Payer: Self-pay | Admitting: Medical Oncology

## 2014-04-24 VITALS — BP 133/62 | HR 109 | Temp 98.9°F | Resp 20

## 2014-04-24 DIAGNOSIS — R112 Nausea with vomiting, unspecified: Secondary | ICD-10-CM

## 2014-04-24 DIAGNOSIS — G893 Neoplasm related pain (acute) (chronic): Secondary | ICD-10-CM

## 2014-04-24 DIAGNOSIS — C3491 Malignant neoplasm of unspecified part of right bronchus or lung: Secondary | ICD-10-CM

## 2014-04-24 DIAGNOSIS — C7951 Secondary malignant neoplasm of bone: Secondary | ICD-10-CM

## 2014-04-24 DIAGNOSIS — D630 Anemia in neoplastic disease: Secondary | ICD-10-CM

## 2014-04-24 DIAGNOSIS — E860 Dehydration: Secondary | ICD-10-CM

## 2014-04-24 DIAGNOSIS — C3490 Malignant neoplasm of unspecified part of unspecified bronchus or lung: Secondary | ICD-10-CM

## 2014-04-24 DIAGNOSIS — R197 Diarrhea, unspecified: Secondary | ICD-10-CM

## 2014-04-24 DIAGNOSIS — E876 Hypokalemia: Secondary | ICD-10-CM

## 2014-04-24 DIAGNOSIS — R251 Tremor, unspecified: Secondary | ICD-10-CM

## 2014-04-24 DIAGNOSIS — D696 Thrombocytopenia, unspecified: Secondary | ICD-10-CM

## 2014-04-24 LAB — COMPREHENSIVE METABOLIC PANEL (CC13)
ALT: 12 U/L (ref 0–55)
ANION GAP: 15 meq/L — AB (ref 3–11)
AST: 13 U/L (ref 5–34)
Albumin: 3.4 g/dL — ABNORMAL LOW (ref 3.5–5.0)
Alkaline Phosphatase: 211 U/L — ABNORMAL HIGH (ref 40–150)
BUN: 35.6 mg/dL — AB (ref 7.0–26.0)
CALCIUM: 7.1 mg/dL — AB (ref 8.4–10.4)
CHLORIDE: 98 meq/L (ref 98–109)
CO2: 26 meq/L (ref 22–29)
CREATININE: 0.8 mg/dL (ref 0.6–1.1)
EGFR: 70 mL/min/{1.73_m2} — AB (ref >90)
GLUCOSE: 107 mg/dL (ref 70–140)
Potassium: 2.8 mEq/L — CL (ref 3.5–5.1)
Sodium: 138 mEq/L (ref 136–145)
Total Bilirubin: 1.3 mg/dL — ABNORMAL HIGH (ref 0.20–1.20)
Total Protein: 6.6 g/dL (ref 6.4–8.3)

## 2014-04-24 LAB — CBC WITH DIFFERENTIAL/PLATELET
BASO%: 0.2 % (ref 0.0–2.0)
BASOS ABS: 0 10*3/uL (ref 0.0–0.1)
EOS ABS: 0 10*3/uL (ref 0.0–0.5)
EOS%: 0 % (ref 0.0–7.0)
HEMATOCRIT: 25.2 % — AB (ref 34.8–46.6)
HGB: 8.6 g/dL — ABNORMAL LOW (ref 11.6–15.9)
LYMPH%: 21.2 % (ref 14.0–49.7)
MCH: 27 pg (ref 25.1–34.0)
MCHC: 34.1 g/dL (ref 31.5–36.0)
MCV: 79.2 fL — ABNORMAL LOW (ref 79.5–101.0)
MONO#: 0.5 10*3/uL (ref 0.1–0.9)
MONO%: 10.9 % (ref 0.0–14.0)
NEUT%: 67.7 % (ref 38.4–76.8)
NEUTROS ABS: 3.2 10*3/uL (ref 1.5–6.5)
PLATELETS: 13 10*3/uL — AB (ref 145–400)
RBC: 3.18 10*6/uL — ABNORMAL LOW (ref 3.70–5.45)
RDW: 14.1 % (ref 11.2–14.5)
WBC: 4.8 10*3/uL (ref 3.9–10.3)
lymph#: 1 10*3/uL (ref 0.9–3.3)
nRBC: 0 % (ref 0–0)

## 2014-04-24 MED ORDER — ONDANSETRON 8 MG/50ML IVPB (CHCC)
8.0000 mg | Freq: Once | INTRAVENOUS | Status: AC
  Administered 2014-04-24: 8 mg via INTRAVENOUS

## 2014-04-24 MED ORDER — OXYCODONE-ACETAMINOPHEN 5-325 MG PO TABS
ORAL_TABLET | ORAL | Status: AC
  Filled 2014-04-24: qty 1

## 2014-04-24 MED ORDER — POTASSIUM CHLORIDE CRYS ER 20 MEQ PO TBCR
20.0000 meq | EXTENDED_RELEASE_TABLET | Freq: Once | ORAL | Status: AC
  Administered 2014-04-24: 20 meq via ORAL
  Filled 2014-04-24: qty 1

## 2014-04-24 MED ORDER — ONDANSETRON 8 MG/NS 50 ML IVPB
INTRAVENOUS | Status: AC
  Filled 2014-04-24: qty 8

## 2014-04-24 MED ORDER — OXYCODONE HCL 5 MG PO TABS: 5.0000 mg | ORAL_TABLET | ORAL | Status: DC | PRN

## 2014-04-24 MED ORDER — OXYCODONE-ACETAMINOPHEN 5-325 MG PO TABS
1.0000 | ORAL_TABLET | Freq: Once | ORAL | Status: AC
  Administered 2014-04-24: 1 via ORAL

## 2014-04-24 MED ORDER — SODIUM CHLORIDE 0.9 % IV SOLN
INTRAVENOUS | Status: AC
  Administered 2014-04-24: 15:00:00 via INTRAVENOUS
  Filled 2014-04-24: qty 1000

## 2014-04-24 NOTE — Progress Notes (Signed)
1525-Pt complaining of "aching" pain rated at a "7" to mid back.  Michel Harrow NP notified and order received to give pt one Percocet tablet now.  1625-Pt states pain now at a "6".

## 2014-04-24 NOTE — Telephone Encounter (Signed)
Having diarrhea 3-4 times a day for 3 days now. Not eating , getting some fluids in ., very weak , denies dizziness. She has not taken anything for diarrhea. Symptom management visit scheduled.

## 2014-04-24 NOTE — Patient Instructions (Addendum)
Dehydration, Adult Dehydration is when you lose more fluids from the body than you take in. Vital organs like the kidneys, brain, and heart cannot function without a proper amount of fluids and salt. Any loss of fluids from the body can cause dehydration.  CAUSES   Vomiting.  Diarrhea.  Excessive sweating.  Excessive urine output.  Fever. SYMPTOMS  Mild dehydration  Thirst.  Dry lips.  Slightly dry mouth. Moderate dehydration  Very dry mouth.  Sunken eyes.  Skin does not bounce back quickly when lightly pinched and released.  Dark urine and decreased urine production.  Decreased tear production.  Headache. Severe dehydration  Very dry mouth.  Extreme thirst.  Rapid, weak pulse (more than 100 beats per minute at rest).  Cold hands and feet.  Not able to sweat in spite of heat and temperature.  Rapid breathing.  Blue lips.  Confusion and lethargy.  Difficulty being awakened.  Minimal urine production.  No tears. DIAGNOSIS  Your caregiver will diagnose dehydration based on your symptoms and your exam. Blood and urine tests will help confirm the diagnosis. The diagnostic evaluation should also identify the cause of dehydration. TREATMENT  Treatment of mild or moderate dehydration can often be done at home by increasing the amount of fluids that you drink. It is best to drink small amounts of fluid more often. Drinking too much at one time can make vomiting worse. Refer to the home care instructions below. Severe dehydration needs to be treated at the hospital where you will probably be given intravenous (IV) fluids that contain water and electrolytes. HOME CARE INSTRUCTIONS   Ask your caregiver about specific rehydration instructions.  Drink enough fluids to keep your urine clear or pale yellow.  Drink small amounts frequently if you have nausea and vomiting.  Eat as you normally do.  Avoid:  Foods or drinks high in sugar.  Carbonated  drinks.  Juice.  Extremely hot or cold fluids.  Drinks with caffeine.  Fatty, greasy foods.  Alcohol.  Tobacco.  Overeating.  Gelatin desserts.  Wash your hands well to avoid spreading bacteria and viruses.  Only take over-the-counter or prescription medicines for pain, discomfort, or fever as directed by your caregiver.  Ask your caregiver if you should continue all prescribed and over-the-counter medicines.  Keep all follow-up appointments with your caregiver. SEEK MEDICAL CARE IF:  You have abdominal pain and it increases or stays in one area (localizes).  You have a rash, stiff neck, or severe headache.  You are irritable, sleepy, or difficult to awaken.  You are weak, dizzy, or extremely thirsty. SEEK IMMEDIATE MEDICAL CARE IF:   You are unable to keep fluids down or you get worse despite treatment.  You have frequent episodes of vomiting or diarrhea.  You have blood or green matter (bile) in your vomit.  You have blood in your stool or your stool looks black and tarry.  You have not urinated in 6 to 8 hours, or you have only urinated a small amount of very dark urine.  You have a fever.  You faint. MAKE SURE YOU:   Understand these instructions.  Will watch your condition.  Will get help right away if you are not doing well or get worse. Document Released: 05/05/2005 Document Revised: 07/28/2011 Document Reviewed: 12/23/2010 [employer] Patient Information 2015 [employer]. This information is not intended to replace advice given to you by your health care provider. Make sure you discuss any questions you have with your health care   provider. Thrombocytopenia Thrombocytopenia means there are not enough platelets in your blood. Platelets are tiny cells in your blood. When you start bleeding, platelets clump together around the cut or injury to stop the bleeding. This process is called blood clotting. Not having enough platelets can cause bleeding  problems. HOME CARE  Check your skin and inside your mouth for bruises or blood as told by your doctor.  Check your spit (sputum), pee (urine), and poop (stool) for blood as told by your doctor.  Do not do activities that can cause bumps or bruises until your doctor says it is okay.  Be careful not to cut yourself when you shave or use scissors, needles, knives, or other tools.  Be careful not to burn yourself when you iron or cook.  Ask your doctor if you can drink alcohol.  Only take medicines as told by your doctor.  Tell all your doctors and your dentist that you have this bleeding problem. GET HELP RIGHT AWAY IF:  You are bleeding anywhere on your body.  You are bleeding or have bruises without knowing why.  You have blood in your spit, pee, or poop. MAKE SURE YOU:  Understand these instructions.  Will watch your condition.  Will get help right away if you are not doing well or get worse. Document Released: 04/24/2011 Document Revised: 07/28/2011 Document Reviewed: 04/24/2011 [employer] Patient Information 2015 [place], [place]. This information is not intended to replace advice given to you by your health care provider. Make sure you discuss any questions you have with your health care provider.

## 2014-04-25 ENCOUNTER — Ambulatory Visit (HOSPITAL_COMMUNITY)
Admission: RE | Admit: 2014-04-25 | Discharge: 2014-04-25 | Disposition: A | Discharge status: Home / Self Care | Payer: Medicare Other | Source: Ambulatory Visit | Attending: Internal Medicine | Admitting: Internal Medicine

## 2014-04-25 ENCOUNTER — Encounter: Payer: Self-pay | Admitting: Nurse Practitioner

## 2014-04-25 ENCOUNTER — Telehealth: Payer: Self-pay | Admitting: Nurse Practitioner

## 2014-04-25 ENCOUNTER — Other Ambulatory Visit: Payer: Self-pay | Admitting: Nurse Practitioner

## 2014-04-25 ENCOUNTER — Telehealth: Payer: Self-pay | Admitting: *Deleted

## 2014-04-25 DIAGNOSIS — D6481 Anemia due to antineoplastic chemotherapy: Secondary | ICD-10-CM | POA: Insufficient documentation

## 2014-04-25 DIAGNOSIS — C7951 Secondary malignant neoplasm of bone: Secondary | ICD-10-CM | POA: Insufficient documentation

## 2014-04-25 DIAGNOSIS — D696 Thrombocytopenia, unspecified: Secondary | ICD-10-CM

## 2014-04-25 DIAGNOSIS — R251 Tremor, unspecified: Secondary | ICD-10-CM | POA: Insufficient documentation

## 2014-04-25 DIAGNOSIS — E876 Hypokalemia: Secondary | ICD-10-CM

## 2014-04-25 DIAGNOSIS — R197 Diarrhea, unspecified: Secondary | ICD-10-CM

## 2014-04-25 DIAGNOSIS — E860 Dehydration: Secondary | ICD-10-CM

## 2014-04-25 DIAGNOSIS — D630 Anemia in neoplastic disease: Secondary | ICD-10-CM | POA: Insufficient documentation

## 2014-04-25 DIAGNOSIS — R112 Nausea with vomiting, unspecified: Secondary | ICD-10-CM

## 2014-04-25 DIAGNOSIS — G893 Neoplasm related pain (acute) (chronic): Secondary | ICD-10-CM

## 2014-04-25 DIAGNOSIS — C3490 Malignant neoplasm of unspecified part of unspecified bronchus or lung: Secondary | ICD-10-CM | POA: Insufficient documentation

## 2014-04-25 DIAGNOSIS — T451X5A Adverse effect of antineoplastic and immunosuppressive drugs, initial encounter: Secondary | ICD-10-CM | POA: Insufficient documentation

## 2014-04-25 MED ORDER — POTASSIUM CHLORIDE CRYS ER 20 MEQ PO TBCR: 20.0000 meq | EXTENDED_RELEASE_TABLET | Freq: Every day | ORAL | Status: DC

## 2014-04-25 NOTE — Assessment & Plan Note (Signed)
Potassium decreased at 2.8 today.  Most likely, this is secondary to diarrhea and dehydration.  Patient was given potassium 20 mEq IV today; as well as potassium 20 mEq orally.  Also prescribed potassium 20 mEq per day for patient to take at home.

## 2014-04-25 NOTE — Assessment & Plan Note (Signed)
Chemotherapy-associated anemia noted today with a hemoglobin of 8.6.  Patient is complaining of increased fatigue; but denies any worsening shortness of breath with exertion.  Will continue to monitor closely.

## 2014-04-25 NOTE — Assessment & Plan Note (Addendum)
chemotherapy-associated thrombus cytopenia noted today with a platelet count down to 13.  Patient was noted have a questionable small hematoma to her right upper, inner arm.  Otherwise, no active bleeding noted on exam.  Since patient is planning to have a Port-A-Cath placed on Thursday, 04/27/2014-will transfuse 1 unit of platelets on Wednesday, 04/26/2014.

## 2014-04-25 NOTE — Assessment & Plan Note (Signed)
Bilirubin has increased to 1.3.  Previously been 0.73.  Most likely, this is secondary to dehydration.  Will continue to monitor closely.

## 2014-04-25 NOTE — Assessment & Plan Note (Signed)
Patient received cycle 2 of her carboplatin/etoposide chemotherapy regimen on 04/11/2014.  She is scheduled to receive cycle 3 of the same regimen on 05/02/2014.  Patient is scheduled for cath placement on Thursday 04/27/2014.    She is also scheduled for restaging CT of the chest on 05/01/2014.

## 2014-04-25 NOTE — Assessment & Plan Note (Signed)
Patient has been noted to have some fine tremors of all of her extremities since initiating chemotherapy.  Brain MRI recently obtained revealed no acute findings.  It was noted the patient does have a few osseous school metastasis.  Patient has tried Ativan at home for the tremor; but states that this is been ineffective.  The patient has been ordered a neurological referral for further evaluation.

## 2014-04-25 NOTE — Assessment & Plan Note (Signed)
Patient is complaining of chronic nausea.  She has not been taking her antiemetics she has hardly been prescribed.  Patient was once again instructed regarding appropriate use of her antiemetics at home.  Also, patient was given Zofran 8 mg IV while at the [facility] today.  Patient's nausea was much better under control once completing her IV fluid rehydration today.  She was also encouraged to follow a clear liquid diet; into able to tolerate a more advanced diet.

## 2014-04-25 NOTE — Assessment & Plan Note (Signed)
Alkaline phosphatase has actually improved from 243 down to 211.  Will continue to monitor closely.

## 2014-04-25 NOTE — Progress Notes (Signed)
will   SYMPTOM MANAGEMENT CLINIC   HPI: [name] 73 y.o. female diagnosed with lung cancer.  Currently undergoing Carboplatin/etoposide chemotherapy regimen.  Patient is also receiving Xgeva on a monthly basis.  Patient called the cancer Center requesting urgent care visit today.  She is complaining of some chronic nausea; and intermittent vomiting; and also states that she has developed diarrhea within the past 3-4 days as well.  She feels fairly dehydrated today.  She is also complaining of some chronic pain; specifically to her back.  As she takes both oxycodone and Lyrica for her pain.  Patient has been experiencing some generalized tremors since initiating chemotherapy.  She denies any other neurologic deficits whatsoever.  Brain MRI recently obtained revealed no acute findings within the brain; but was positive for some osseous skull metastasis.  Patient has tried Ativan for the tremor; with minimal effectiveness.  Patient has plans to follow-up with her neurological consult for further evaluation.  Patient denies any other new symptoms whatsoever.  She denies any recent fevers or chills.     HPI  CURRENT THERAPY: Upcoming Treatment Dates - LUNG SMALL CELL Carboplatin D1 / Etoposide D1-3 q21d Days with orders from any treatment category:  05/03/2014      SCHEDULING COMMUNICATION      ondansetron (ZOFRAN) IVPB 16 mg      Dexamethasone Sodium Phosphate (DECADRON) injection 20 mg      CARBOplatin (PARAPLATIN) in sodium chloride 0.9 % 100 mL chemo infusion      etoposide (VEPESID) 180 mg in sodium chloride 0.9 % 500 mL chemo infusion      sodium chloride 0.9 % injection 10 mL      heparin lock flush 100 unit/mL      heparin lock flush 100 unit/mL      alteplase (CATHFLO ACTIVASE) injection 2 mg      sodium chloride 0.9 % injection 3 mL      Hot Pack 1 packet      0.9 %  sodium chloride infusion      TREATMENT CONDITIONS 05/04/2014      SCHEDULING COMMUNICATION  prochlorperazine (COMPAZINE) tablet 10 mg      etoposide (VEPESID) 180 mg in sodium chloride 0.9 % 500 mL chemo infusion      sodium chloride 0.9 % injection 10 mL      heparin lock flush 100 unit/mL      heparin lock flush 100 unit/mL      alteplase (CATHFLO ACTIVASE) injection 2 mg      sodium chloride 0.9 % injection 3 mL      Hot Pack 1 packet      0.9 %  sodium chloride infusion 05/05/2014      SCHEDULING COMMUNICATION      prochlorperazine (COMPAZINE) tablet 10 mg      etoposide (VEPESID) 180 mg in sodium chloride 0.9 % 500 mL chemo infusion      sodium chloride 0.9 % injection 10 mL      heparin lock flush 100 unit/mL      heparin lock flush 100 unit/mL      alteplase (CATHFLO ACTIVASE) injection 2 mg      sodium chloride 0.9 % injection 3 mL      Hot Pack 1 packet      0.9 %  sodium chloride infusion    ROS  Past Medical History  Diagnosis Date  . GERD (gastroesophageal reflux disease)   . IBS (irritable bowel syndrome)   .  Adenomatous colon polyp 06/2009  . Hiatal hernia   . Breast cyst   . Diverticulosis   . Hemorrhoids   . Hyperlipidemia   . Hypertension   . Hyperparathyroidism   . Anxiety   . Migraine   . Osteoporosis   . Vitamin D deficiency   . Spinal stenosis   . C. difficile colitis 2006  . Small cell lung carcinoma     Past Surgical History  Procedure Laterality Date  . Cholecystectomy    . Vaginal hysterectomy    . Knee arthroscopy      left  . Parathyroidectomy  2009    [facility]  . Lumbar spine surgery  2001  . Breast biopsy      bilateral  . Total knee arthroplasty      right  . Bladder repair    . Video bronchoscopy Bilateral 03/14/2014    Procedure: VIDEO BRONCHOSCOPY WITHOUT FLUORO;  Surgeon: Tanda Rockers, MD;  Location: [facility];  Service: Cardiopulmonary;  Laterality: Bilateral;    has GERD; IRRITABLE BOWEL SYNDROME; CHEST PAIN; ABDOMINAL PAIN-EPIGASTRIC; NONSPEC ELEVATION OF LEVELS OF TRANSAMINASE/LDH; PERSONAL HX  COLONIC POLYPS; Lung mass; Acute mid back pain secondary to lytic lesion T8  03/02/14; Small cell carcinoma of lung; Nausea with vomiting; Dehydration; Diarrhea; Hypokalemia; Thrombocytopenia; Neoplasm related pain; Tremor; Bone metastasis; Hyperbilirubinemia; Hyperphosphatemia; and Anemia in neoplastic disease on her problem list.     is allergic to ace inhibitors; cephalexin; etodolac; and gabapentin.    Medication List       This list is accurate as of: 04/24/14 11:59 PM.  Always use your most recent med list.               ALPRAZolam 0.5 MG tablet  Commonly known as:  XANAX  Take 0.5 mg by mouth at bedtime.     atorvastatin 80 MG tablet  Commonly known as:  LIPITOR  Take 80 mg by mouth daily.     buPROPion 150 MG 12 hr tablet  Commonly known as:  WELLBUTRIN SR  Take 150 mg by mouth daily.     dexlansoprazole 60 MG capsule  Commonly known as:  DEXILANT  TAKE 1 CAPSULE (60 MG TOTAL) BY MOUTH DAILY.     glycopyrrolate 2 MG tablet  Commonly known as:  ROBINUL  TAKE 1 TABLET (2 MG TOTAL) BY MOUTH 2 (TWO) TIMES DAILY.     hydrochlorothiazide 12.5 MG tablet  Commonly known as:  HYDRODIURIL  Take 1 tablet by mouth daily.     HYDROcodone-acetaminophen 5-325 MG per tablet  Commonly known as:  NORCO/VICODIN  Take 1 tablet by mouth every 4 (four) hours as needed for moderate pain.     lidocaine-prilocaine cream  Commonly known as:  EMLA  Apply 1 application topically as needed. Apply to port 1 hr before chemo     LORazepam 0.5 MG tablet  Commonly known as:  ATIVAN  Take 1 tablet (0.5 mg total) by mouth every 8 (eight) hours.     metoprolol succinate 50 MG 24 hr tablet  Commonly known as:  TOPROL-XL  Take 50 mg by mouth daily.     MIRALAX PO  Take 1 scoop by mouth daily as needed (for constipation).     oxycodone 5 MG capsule  Commonly known as:  OXY-IR  1-2 every 4 hours as needed for back pain     pregabalin 50 MG capsule  Commonly known as:  LYRICA  Take 50 mg  by mouth daily.  prochlorperazine 10 MG tablet  Commonly known as:  COMPAZINE  Take 1 tablet (10 mg total) by mouth every 6 (six) hours as needed for nausea or vomiting.     traMADol 50 MG tablet  Commonly known as:  ULTRAM  Take 50 mg by mouth 3 (three) times daily as needed for moderate pain.     ZORVOLEX 35 MG Caps  Generic drug:  Diclofenac  Take 35 mg by mouth. Take 1 daily by mouth.         PHYSICAL EXAMINATION  Blood pressure 126/69, pulse 112, temperature 97.5 F (36.4 C), temperature source Oral, resp. rate 17, height 5' 5"  (1.651 m), weight 129 lb 9.6 oz (58.786 kg), SpO2 98 %.  Physical Exam  Constitutional: She is oriented to person, place, and time. Vital signs are normal. She appears dehydrated. She appears unhealthy. She appears cachectic.  HENT:  Head: Normocephalic and atraumatic.  Mouth/Throat: Oropharynx is clear and moist.  Eyes: Conjunctivae and EOM are normal. Pupils are equal, round, and reactive to light. Right eye exhibits no discharge. Left eye exhibits no discharge. No scleral icterus.  Neck: Normal range of motion. Neck supple. No JVD present. No tracheal deviation present. No thyromegaly present.  Cardiovascular: Normal rate, regular rhythm, normal heart sounds and intact distal pulses.   Pulmonary/Chest: Effort normal and breath sounds normal. No respiratory distress. She has no wheezes. She has no rales. She exhibits no tenderness.  Abdominal: Soft. Bowel sounds are normal. She exhibits no distension and no mass. There is no tenderness. There is no rebound and no guarding.  Musculoskeletal: Normal range of motion. She exhibits no edema or tenderness.  Lymphadenopathy:    She has no cervical adenopathy.  Neurological: She is alert and oriented to person, place, and time.  Skin: Skin is warm and dry. No rash noted. No erythema. There is pallor.  Psychiatric: Affect normal.  Nursing note and vitals reviewed.   LABORATORY DATA:. Appointment on  04/24/2014  Component Date Value Ref Range Status  . WBC 04/24/2014 4.8  3.9 - 10.3 10e3/uL Final  . NEUT# 04/24/2014 3.2  1.5 - 6.5 10e3/uL Final  . HGB 04/24/2014 8.6* 11.6 - 15.9 g/dL Final  . HCT 04/24/2014 25.2* 34.8 - 46.6 % Final  . Platelets 04/24/2014 13* 145 - 400 10e3/uL Final  . MCV 04/24/2014 79.2* 79.5 - 101.0 fL Final  . MCH 04/24/2014 27.0  25.1 - 34.0 pg Final  . MCHC 04/24/2014 34.1  31.5 - 36.0 g/dL Final  . RBC 04/24/2014 3.18* 3.70 - 5.45 10e6/uL Final  . RDW 04/24/2014 14.1  11.2 - 14.5 % Final  . lymph# 04/24/2014 1.0  0.9 - 3.3 10e3/uL Final  . MONO# 04/24/2014 0.5  0.1 - 0.9 10e3/uL Final  . Eosinophils Absolute 04/24/2014 0.0  0.0 - 0.5 10e3/uL Final  . Basophils Absolute 04/24/2014 0.0  0.0 - 0.1 10e3/uL Final  . NEUT% 04/24/2014 67.7  38.4 - 76.8 % Final  . LYMPH% 04/24/2014 21.2  14.0 - 49.7 % Final  . MONO% 04/24/2014 10.9  0.0 - 14.0 % Final  . EOS% 04/24/2014 0.0  0.0 - 7.0 % Final  . BASO% 04/24/2014 0.2  0.0 - 2.0 % Final  . nRBC 04/24/2014 0  0 - 0 % Final  . Sodium 04/24/2014 138  136 - 145 mEq/L Final  . Potassium 04/24/2014 2.8* 3.5 - 5.1 mEq/L Final  . Chloride 04/24/2014 98  98 - 109 mEq/L Final  . CO2 04/24/2014 26  22 - 29 mEq/L Final  . Glucose 04/24/2014 107  70 - 140 mg/dl Final  . BUN 04/24/2014 35.6* 7.0 - 26.0 mg/dL Final  . Creatinine 04/24/2014 0.8  0.6 - 1.1 mg/dL Final  . Total Bilirubin 04/24/2014 1.30* 0.20 - 1.20 mg/dL Final  . Alkaline Phosphatase 04/24/2014 211* 40 - 150 U/L Final  . AST 04/24/2014 13  5 - 34 U/L Final  . ALT 04/24/2014 12  0 - 55 U/L Final  . Total Protein 04/24/2014 6.6  6.4 - 8.3 g/dL Final  . Albumin 04/24/2014 3.4* 3.5 - 5.0 g/dL Final  . Calcium 04/24/2014 7.1* 8.4 - 10.4 mg/dL Final  . Anion Gap 04/24/2014 15* 3 - 11 mEq/L Final  . EGFR 04/24/2014 70* >90 ml/min/1.73 m2 Final   eGFR is calculated using the CKD-EPI Creatinine Equation (2009)     RADIOGRAPHIC STUDIES: No results  found.  ASSESSMENT/PLAN:    Anemia in neoplastic disease Chemotherapy-associated anemia noted today with a hemoglobin of 8.6.  Patient is complaining of increased fatigue; but denies any worsening shortness of breath with exertion.  Will continue to monitor closely.  Bone metastasis Patient has been diagnosed with bone metastasis.  She is currently undergoing Xgeva therapy.  She last received Xgeva on 04/14/2014.  She will receive the Xgeva therapy on an every four-week basis.  Dehydration Patient is complaining of minimal appetite; as well as some chronic nausea and recent onset of diarrhea.  She feels very dehydrated today.  Patient will receive 1 L normal saline IV fluid rehydration today.  She was also encouraged to push fluids is much as possible.  Diarrhea Patient has developed some diarrhea within the past 3-4 days as well.  Patient states she has not taken any over-the-counter Imodium.  Instructed patient to take Imodium as directed.  Hyperbilirubinemia Bilirubin has increased to 1.3.  Previously been 0.73.  Most likely, this is secondary to dehydration.  Will continue to monitor closely.  Hyperphosphatemia Alkaline phosphatase has actually improved from 243 down to 211.  Will continue to monitor closely.  Hypokalemia Potassium decreased at 2.8 today.  Most likely, this is secondary to diarrhea and dehydration.  Patient was given potassium 20 mEq IV today; as well as potassium 20 mEq orally.  Also prescribed potassium 20 mEq per day for patient to take at home.  Nausea with vomiting Patient is complaining of chronic nausea.  She has not been taking her antiemetics she has hardly been prescribed.  Patient was once again instructed regarding appropriate use of her antiemetics at home.  Also, patient was given Zofran 8 mg IV while at the [facility] today.  Patient's nausea was much better under control once completing her IV fluid rehydration today.  She was also encouraged to  follow a clear liquid diet; into able to tolerate a more advanced diet.  Neoplasm related pain Patient is complaining of some generalized pain; and some specific back pain which is chronic for her.  The patient takes oxycodone at home on a regular basis.  Patient was given a Percocet while in the infusion Center today per her request.  Patient denies need for any refills at this time.  Small cell carcinoma of lung Patient received cycle 2 of her carboplatin/etoposide chemotherapy regimen on 04/11/2014.  She is scheduled to receive cycle 3 of the same regimen on 05/02/2014.  Patient is scheduled for cath placement on Thursday 04/27/2014.    She is also scheduled for restaging CT of the chest on  05/01/2014.    Thrombocytopenia  chemotherapy-associated thrombus cytopenia noted today with a platelet count down to 13.  Patient was noted have a questionable small hematoma to her right upper, inner arm.  Otherwise, no active bleeding noted on exam.  Since patient is planning to have a Port-A-Cath placed on Thursday, 04/27/2014-will transfuse 1 unit of platelets on Wednesday, 04/26/2014.    Tremor Patient has been noted to have some fine tremors of all of her extremities since initiating chemotherapy.  Brain MRI recently obtained revealed no acute findings.  It was noted the patient does have a few osseous school metastasis.  Patient has tried Ativan at home for the tremor; but states that this is been ineffective.  The patient has been ordered a neurological referral for further evaluation.   Patient stated understanding of all instructions; and was in agreement with this plan of care. The patient knows to call the clinic with any problems, questions or concerns.   Review/collaboration with Dr. Julien Nordmann  regarding all aspects of patient's visit today.   Total time spent with patient was 40 minutes;  with greater than 75 percent of that time spent in face to face counseling rher symptoms, and  coordination of care and follow up.  Disclaimer: This note was dictated with voice recognition software. Similar sounding words can inadvertently be transcribed and may not be corrected upon review.   Drue Second, NP 04/25/2014

## 2014-04-25 NOTE — Telephone Encounter (Signed)
Called [facility] in admitting for [place];  Spoke with Beth in blood bank - they have patients blood type on record so no need for lab work tomorrow. Spoke with patient and husband.  Let them know rx for K+ has been called into [facility] in [place].   Let them know that platelet transfusion will be done at [place] at 11am tomorrow.  Gave them directions and made them aware of limited visitor access at patient bedside, however visitors can wait in the lobby of [facility].

## 2014-04-25 NOTE — Assessment & Plan Note (Signed)
Patient is complaining of minimal appetite; as well as some chronic nausea and recent onset of diarrhea.  She feels very dehydrated today.  Patient will receive 1 L normal saline IV fluid rehydration today.  She was also encouraged to push fluids is much as possible.

## 2014-04-25 NOTE — Assessment & Plan Note (Signed)
Patient has been diagnosed with bone metastasis.  She is currently undergoing Xgeva therapy.  She last received Xgeva on 04/14/2014.  She will receive the Xgeva therapy on an every four-week basis.

## 2014-04-25 NOTE — Assessment & Plan Note (Signed)
Patient has developed some diarrhea within the past 3-4 days as well.  Patient states she has not taken any over-the-counter Imodium.  Instructed patient to take Imodium as directed.

## 2014-04-25 NOTE — Assessment & Plan Note (Signed)
Patient is complaining of some generalized pain; and some specific back pain which is chronic for her.  The patient takes oxycodone at home on a regular basis.  Patient was given a Percocet while in the infusion Center today per her request.  Patient denies need for any refills at this time.

## 2014-04-26 ENCOUNTER — Non-Acute Institutional Stay (HOSPITAL_COMMUNITY): Admission: AD | Admit: 2014-04-26 | Payer: Medicare Other | Source: Ambulatory Visit | Admitting: Internal Medicine

## 2014-04-26 ENCOUNTER — Other Ambulatory Visit: Payer: Self-pay | Admitting: Radiology

## 2014-04-26 ENCOUNTER — Ambulatory Visit (HOSPITAL_COMMUNITY)
Admission: RE | Admit: 2014-04-26 | Discharge: 2014-04-26 | Disposition: A | Discharge status: Home / Self Care | Payer: Medicare Other | Source: Ambulatory Visit | Attending: Internal Medicine | Admitting: Internal Medicine

## 2014-04-26 ENCOUNTER — Other Ambulatory Visit: Payer: Medicare Other

## 2014-04-26 VITALS — BP 137/73 | HR 103 | Temp 98.2°F | Resp 20

## 2014-04-26 DIAGNOSIS — R112 Nausea with vomiting, unspecified: Secondary | ICD-10-CM

## 2014-04-26 DIAGNOSIS — R251 Tremor, unspecified: Secondary | ICD-10-CM

## 2014-04-26 DIAGNOSIS — C3490 Malignant neoplasm of unspecified part of unspecified bronchus or lung: Secondary | ICD-10-CM | POA: Diagnosis not present

## 2014-04-26 DIAGNOSIS — D696 Thrombocytopenia, unspecified: Secondary | ICD-10-CM

## 2014-04-26 DIAGNOSIS — C7951 Secondary malignant neoplasm of bone: Secondary | ICD-10-CM | POA: Diagnosis not present

## 2014-04-26 DIAGNOSIS — G893 Neoplasm related pain (acute) (chronic): Secondary | ICD-10-CM

## 2014-04-26 DIAGNOSIS — E860 Dehydration: Secondary | ICD-10-CM

## 2014-04-26 DIAGNOSIS — E876 Hypokalemia: Secondary | ICD-10-CM

## 2014-04-26 DIAGNOSIS — R197 Diarrhea, unspecified: Secondary | ICD-10-CM

## 2014-04-26 DIAGNOSIS — D630 Anemia in neoplastic disease: Secondary | ICD-10-CM

## 2014-04-26 MED ORDER — HEPARIN SOD (PORK) LOCK FLUSH 100 UNIT/ML IV SOLN: 250.0000 [IU] | INTRAVENOUS | Status: DC | PRN

## 2014-04-26 MED ORDER — ACETAMINOPHEN 325 MG PO TABS
650.0000 mg | ORAL_TABLET | Freq: Once | ORAL | Status: AC
  Administered 2014-04-26: 650 mg via ORAL
  Filled 2014-04-26: qty 2

## 2014-04-26 MED ORDER — HEPARIN SOD (PORK) LOCK FLUSH 100 UNIT/ML IV SOLN: 500.0000 [IU] | Freq: Every day | INTRAVENOUS | Status: DC | PRN

## 2014-04-26 MED ORDER — SODIUM CHLORIDE 0.9 % IJ SOLN: 3.0000 mL | INTRAMUSCULAR | Status: DC | PRN

## 2014-04-26 MED ORDER — DIPHENHYDRAMINE HCL 25 MG PO CAPS
25.0000 mg | ORAL_CAPSULE | Freq: Once | ORAL | Status: AC
  Administered 2014-04-26: 25 mg via ORAL
  Filled 2014-04-26: qty 1

## 2014-04-26 MED ORDER — SODIUM CHLORIDE 0.9 % IJ SOLN: 10.0000 mL | INTRAMUSCULAR | Status: DC | PRN

## 2014-04-26 MED ORDER — SODIUM CHLORIDE 0.9 % IV SOLN
250.0000 mL | Freq: Once | INTRAVENOUS | Status: AC
  Administered 2014-04-26: 250 mL via INTRAVENOUS

## 2014-04-26 NOTE — Procedures (Signed)
[facility]  Procedure Note  [name] PTW:[id number] DOB: [birth year] DOA: 04/26/2014   Dr. Julien Nordmann  Associated Diagnosis: Small Cell Carcinoma of Lung, unspecified laterality  Procedure Note: IV started, platelets infused per order, IV discontinued   Condition During Procedure: patient stable throughout procedure   Condition at Discharge: Patient stable, family at side.   Roberto Scales, RN  [facility]

## 2014-04-27 ENCOUNTER — Encounter (HOSPITAL_COMMUNITY): Payer: Self-pay

## 2014-04-27 ENCOUNTER — Ambulatory Visit (HOSPITAL_COMMUNITY)
Admission: RE | Admit: 2014-04-27 | Discharge: 2014-04-27 | Disposition: A | Discharge status: Home / Self Care | Payer: Medicare Other | Source: Ambulatory Visit | Attending: Internal Medicine | Admitting: Internal Medicine

## 2014-04-27 DIAGNOSIS — E785 Hyperlipidemia, unspecified: Secondary | ICD-10-CM | POA: Insufficient documentation

## 2014-04-27 DIAGNOSIS — E559 Vitamin D deficiency, unspecified: Secondary | ICD-10-CM | POA: Diagnosis not present

## 2014-04-27 DIAGNOSIS — K589 Irritable bowel syndrome without diarrhea: Secondary | ICD-10-CM | POA: Insufficient documentation

## 2014-04-27 DIAGNOSIS — K219 Gastro-esophageal reflux disease without esophagitis: Secondary | ICD-10-CM | POA: Insufficient documentation

## 2014-04-27 DIAGNOSIS — G43909 Migraine, unspecified, not intractable, without status migrainosus: Secondary | ICD-10-CM | POA: Insufficient documentation

## 2014-04-27 DIAGNOSIS — C3491 Malignant neoplasm of unspecified part of right bronchus or lung: Secondary | ICD-10-CM

## 2014-04-27 DIAGNOSIS — Z87891 Personal history of nicotine dependence: Secondary | ICD-10-CM | POA: Insufficient documentation

## 2014-04-27 DIAGNOSIS — M810 Age-related osteoporosis without current pathological fracture: Secondary | ICD-10-CM | POA: Diagnosis not present

## 2014-04-27 DIAGNOSIS — C3490 Malignant neoplasm of unspecified part of unspecified bronchus or lung: Secondary | ICD-10-CM | POA: Diagnosis present

## 2014-04-27 DIAGNOSIS — I878 Other specified disorders of veins: Secondary | ICD-10-CM

## 2014-04-27 DIAGNOSIS — I10 Essential (primary) hypertension: Secondary | ICD-10-CM | POA: Insufficient documentation

## 2014-04-27 DIAGNOSIS — F419 Anxiety disorder, unspecified: Secondary | ICD-10-CM | POA: Insufficient documentation

## 2014-04-27 LAB — CBC WITH DIFFERENTIAL/PLATELET
BASOS ABS: 0.1 10*3/uL (ref 0.0–0.1)
Basophils Relative: 1 % (ref 0–1)
EOS PCT: 0 % (ref 0–5)
Eosinophils Absolute: 0 10*3/uL (ref 0.0–0.7)
HEMATOCRIT: 24 % — AB (ref 36.0–46.0)
Hemoglobin: 8.2 g/dL — ABNORMAL LOW (ref 12.0–15.0)
LYMPHS ABS: 1.5 10*3/uL (ref 0.7–4.0)
Lymphocytes Relative: 17 % (ref 12–46)
MCH: 27.4 pg (ref 26.0–34.0)
MCHC: 34.2 g/dL (ref 30.0–36.0)
MCV: 80.3 fL (ref 78.0–100.0)
MONOS PCT: 13 % — AB (ref 3–12)
Monocytes Absolute: 1.1 10*3/uL — ABNORMAL HIGH (ref 0.1–1.0)
Neutro Abs: 6.1 10*3/uL (ref 1.7–7.7)
Neutrophils Relative %: 69 % (ref 43–77)
Platelets: 86 10*3/uL — ABNORMAL LOW (ref 150–400)
RBC: 2.99 MIL/uL — ABNORMAL LOW (ref 3.87–5.11)
RDW: 14 % (ref 11.5–15.5)
WBC: 8.8 10*3/uL (ref 4.0–10.5)

## 2014-04-27 LAB — PROTIME-INR
INR: 1.23 (ref 0.00–1.49)
Prothrombin Time: 15.7 seconds — ABNORMAL HIGH (ref 11.6–15.2)

## 2014-04-27 LAB — PREPARE PLATELET PHERESIS: UNIT DIVISION: 0

## 2014-04-27 MED ORDER — SODIUM CHLORIDE 0.9 % IV SOLN
INTRAVENOUS | Status: DC
  Administered 2014-04-27: 11:00:00 via INTRAVENOUS

## 2014-04-27 MED ORDER — MIDAZOLAM HCL 2 MG/2ML IJ SOLN
INTRAMUSCULAR | Status: AC | PRN
  Administered 2014-04-27 (×2): 1 mg via INTRAVENOUS

## 2014-04-27 MED ORDER — LIDOCAINE HCL 1 % IJ SOLN
INTRAMUSCULAR | Status: AC
  Filled 2014-04-27: qty 20

## 2014-04-27 MED ORDER — FENTANYL CITRATE 0.05 MG/ML IJ SOLN
INTRAMUSCULAR | Status: AC
  Filled 2014-04-27: qty 4

## 2014-04-27 MED ORDER — HEPARIN SOD (PORK) LOCK FLUSH 100 UNIT/ML IV SOLN
INTRAVENOUS | Status: AC
  Filled 2014-04-27: qty 5

## 2014-04-27 MED ORDER — VANCOMYCIN HCL IN DEXTROSE 1-5 GM/200ML-% IV SOLN
1000.0000 mg | Freq: Once | INTRAVENOUS | Status: AC
  Administered 2014-04-27: 1000 mg via INTRAVENOUS
  Filled 2014-04-27: qty 200

## 2014-04-27 MED ORDER — LIDOCAINE-EPINEPHRINE 2 %-1:100000 IJ SOLN
INTRAMUSCULAR | Status: AC
  Filled 2014-04-27: qty 1

## 2014-04-27 MED ORDER — MIDAZOLAM HCL 2 MG/2ML IJ SOLN
INTRAMUSCULAR | Status: DC
Start: 2014-04-27 — End: 2014-04-28
  Filled 2014-04-27: qty 4

## 2014-04-27 MED ORDER — HEPARIN SOD (PORK) LOCK FLUSH 100 UNIT/ML IV SOLN
500.0000 [IU] | Freq: Once | INTRAVENOUS | Status: AC
  Administered 2014-04-27: 500 [IU] via INTRAVENOUS

## 2014-04-27 MED ORDER — FENTANYL CITRATE 0.05 MG/ML IJ SOLN
INTRAMUSCULAR | Status: AC | PRN
  Administered 2014-04-27 (×2): 50 ug via INTRAVENOUS

## 2014-04-27 NOTE — H&P (Signed)
Chief Complaint: "I am here for a port."  Referring Physician(s): Mohamed,Mohamed  History of Present Illness: [name] is a 73 y.o. female with small cell lung cancer here today for a port a catheter. H&P has been performed within the last 30 days, all history, medications, and exam have been reviewed. No interval changes since H&P.   Past Medical History  Diagnosis Date  . GERD (gastroesophageal reflux disease)   . IBS (irritable bowel syndrome)   . Adenomatous colon polyp 06/2009  . Hiatal hernia   . Breast cyst   . Diverticulosis   . Hemorrhoids   . Hyperlipidemia   . Hypertension   . Hyperparathyroidism   . Anxiety   . Migraine   . Osteoporosis   . Vitamin D deficiency   . Spinal stenosis   . C. difficile colitis 2006  . Small cell lung carcinoma     Past Surgical History  Procedure Laterality Date  . Cholecystectomy    . Vaginal hysterectomy    . Knee arthroscopy      left  . Parathyroidectomy  2009    [facility]  . Lumbar spine surgery  2001  . Breast biopsy      bilateral  . Total knee arthroplasty      right  . Bladder repair    . Video bronchoscopy Bilateral 03/14/2014    Procedure: VIDEO BRONCHOSCOPY WITHOUT FLUORO;  Surgeon: Tanda Rockers, MD;  Location: [facility];  Service: Cardiopulmonary;  Laterality: Bilateral;    Allergies: Ace inhibitors; Cephalexin; Etodolac; and Gabapentin  Medications: Prior to Admission medications   Medication Sig Start Date End Date Taking? Authorizing Provider  ALPRAZolam Duanne Moron) 0.5 MG tablet Take 0.5 mg by mouth at bedtime.     Yes Historical Provider, MD  atorvastatin (LIPITOR) 80 MG tablet Take 80 mg by mouth every morning.    Yes Historical Provider, MD  buPROPion (WELLBUTRIN SR) 150 MG 12 hr tablet Take 150 mg by mouth every morning.    Yes Historical Provider, MD  dexlansoprazole (DEXILANT) 60 MG capsule TAKE 1 CAPSULE (60 MG TOTAL) BY MOUTH DAILY. Patient taking differently: Take 60 mg by  mouth every morning.  07/20/13  Yes Ladene Artist, MD  Diclofenac (ZORVOLEX) 35 MG CAPS Take 35 mg by mouth every morning.    Yes Historical Provider, MD  glycopyrrolate (ROBINUL) 2 MG tablet TAKE 1 TABLET (2 MG TOTAL) BY MOUTH 2 (TWO) TIMES DAILY. 07/20/13  Yes Ladene Artist, MD  hydrochlorothiazide (HYDRODIURIL) 12.5 MG tablet Take 1 tablet by mouth every morning.  02/17/14  Yes Historical Provider, MD  LORazepam (ATIVAN) 0.5 MG tablet Take 1 tablet (0.5 mg total) by mouth every 8 (eight) hours. 04/11/14  Yes Curt Bears, MD  metoprolol (TOPROL-XL) 50 MG 24 hr tablet Take 50 mg by mouth every morning.    Yes Historical Provider, MD  oxycodone (OXY-IR) 5 MG capsule 1-2 every 4 hours as needed for back pain Patient taking differently: Take 5-10 mg by mouth every 4 (four) hours as needed for pain.  04/07/14  Yes Tanda Rockers, MD  Polyethylene Glycol 3350 (MIRALAX PO) Take 1 scoop by mouth daily as needed (for constipation).    Yes Historical Provider, MD  potassium chloride SA (K-DUR,KLOR-CON) 20 MEQ tablet Take 1 tablet (20 mEq total) by mouth daily. 04/25/14  Yes Drue Second, NP  pregabalin (LYRICA) 50 MG capsule Take 50 mg by mouth 2 (two) times daily.    Yes  Historical Provider, MD  prochlorperazine (COMPAZINE) 10 MG tablet Take 1 tablet (10 mg total) by mouth every 6 (six) hours as needed for nausea or vomiting. 03/28/14  Yes Adrena E Johnson, PA-C  traMADol (ULTRAM) 50 MG tablet Take 50 mg by mouth 3 (three) times daily as needed for moderate pain.    Yes Historical Provider, MD  lidocaine-prilocaine (EMLA) cream Apply 1 application topically as needed. Apply to port 1 hr before chemo 04/11/14   Curt Bears, MD    Family History  Problem Relation Age of Onset  . Colon polyps Sister   . Irritable bowel syndrome Sister   . Breast cancer Sister   . Diabetes Sister     X 3  . Diabetes Brother   . Colon cancer Neg Hx     History   Social History  . Marital Status: Married     Spouse Name: N/A    Number of Children: N/A  . Years of Education: N/A   Social History Main Topics  . Smoking status: Former Smoker -- 0.25 packs/day for 25 years    Types: Cigarettes    Quit date: 05/19/2013  . Smokeless tobacco: Never Used  . Alcohol Use: No  . Drug Use: No  . Sexual Activity: None   Other Topics Concern  . None   Social History Narrative   Lives with husband.   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   Review of Systems: A 12 point ROS discussed and pertinent positives are indicated in the HPI above.  All other systems are negative.  Review of Systems  Vital Signs: BP 126/68 mmHg  Pulse 104  Temp(Src) 98.7 F (37.1 C) (Oral)  Resp 18  SpO2 98%  Physical Exam  Constitutional: She is oriented to person, place, and time. No distress.  HENT:  Head: Normocephalic and atraumatic.  Neck: No tracheal deviation present.  Cardiovascular: Exam reveals no gallop and no friction rub.   No murmur heard. Tachycardic  Pulmonary/Chest: Effort normal.  Diminished BS  Abdominal: Soft. Bowel sounds are normal. She exhibits no distension. There is no tenderness.  Neurological: She is alert and oriented to person, place, and time.   Imaging: Mr Jeri Cos QA Contrast  03/31/2014   CLINICAL DATA:  New diagnosis small cell lung cancer. Some confusion. Staging. Some headache.  EXAM: MRI HEAD WITHOUT AND WITH CONTRAST  TECHNIQUE: Multiplanar, multiecho pulse sequences of the brain and surrounding structures were obtained without and with intravenous contrast.  CONTRAST:  36mL MULTIHANCE GADOBENATE DIMEGLUMINE 529 MG/ML IV SOLN  COMPARISON:  None.  FINDINGS: Diffusion imaging does not show any acute or subacute infarction. There chronic small-vessel ischemic changes of the pons. There are old small vessel infarctions affecting the thalami, basal ganglia and cerebral hemispheric deep white matter. No cortical or large vessel territory infarction. There is no  evidence of metastatic disease to the brain or leptomeninges. There are multiple enhancing foci within the skull, skullbase and upper cervical spine consistent with widespread osseous metastatic disease.  No hydrocephalus or extra-axial collection. No pituitary mass. No inflammatory sinus disease.  IMPRESSION: No evidence of metastatic disease to the brain itself. Chronic small-vessel changes as outlined above.  Innumerable small metastatic lesions throughout the calvarium, skullbase and upper cervical spine. No evidence of extraosseous extension in this region.   Electronically Signed   By: Nelson Chimes M.D.   On: 03/31/2014 11:28   Nm Pet Image Initial (pi) Skull Base To Thigh  03/31/2014  CLINICAL DATA:  Initial treatment strategy for small cell lung cancer.  EXAM: NUCLEAR MEDICINE PET SKULL BASE TO THIGH  TECHNIQUE: 7.2 mCi F-18 FDG was injected intravenously. Full-ring PET imaging was performed from the skull base to thigh after the radiotracer. CT data was obtained and used for attenuation correction and anatomic localization.  FASTING BLOOD GLUCOSE:  Value: 114 mg/dl  COMPARISON:  CT from 03/02/2014.  FINDINGS: NECK  No hypermetabolic lymph nodes in the neck.  CHEST  Mediastinum: Hypermetabolic right paratracheal, sub- carinal and right hilar lymph nodes are identified. Right paratracheal lymph node has an SUV max equal to 6.1. SUV max within the sub- carinal lymph node is equal to 6.0. The SUV max associated with the right hilar lymph node is equal to 6.8.  There is no pleural effusion. There are several pleural/ extrapleural nodules within the right lower lobe which are hypermetabolic. Index nodule within the posterior right lung base has an SUV max equal to 6.2.  ABDOMEN/PELVIS  Hepatobiliary: Multifocal hypermetabolic liver metastasis identified. The index lesion within the lateral segment of left lobe of liver measures 2.8 cm and has an SUV max equal to 7.7.  Spleen: The spleen appears within normal  limits.  Pancreas: Unremarkable appearance of the pancreas.  Stomach/Bowel: Intense radiotracer uptake is identified within the distal esophagus at the level of the GE junction. The SUV max is equal to 6.8.  Urinary tract: The adrenal glands are both normal. Bilateral renal cysts are identified. Urinary bladder appears normal for degree of distention.  Vascular/Lymphatic: Calcified atherosclerotic disease involves the abdominal aorta. The aorta has a maximum AP diameter of 2.4 There is a small hypermetabolic right retrocrural lymph node. This has an SUV max equal to 5.4.  Reproductive: The patient is status post hysterectomy. No hypermetabolic adnexal mass identified.  SKELETON  Extensive, heterogeneous abnormal hypermetabolism throughout the visualized axial and appendicular skeleton is noted consistent with widespread metastatic disease. Most of these areas of hyper metabolism do not have a corresponding CT abnormality. Intense FDG uptake associated with the lytic lesion involving the T9 vertebra is identified. This has an SUV max equal to 8.0. There is diffuse sclerosis involving the T12 vertebra. The SUV max within this vertebra is equal to 5.3. Lytic lesion within the right iliac bone has an SUV max equal to 6.6.  IMPRESSION: 1. There is evidence of hypermetabolic mediastinal and right hilar adenopathy as well as hypermetabolic right lower lobe nodules. Multi focal liver metastasis and multifocal hypermetabolic bone metastasis is also identified. Findings are consistent with extensive stage small cell lung cancer. 2. Abnormal increased FDG uptake associated with the distal esophagus may reflect esophagitis. Tumor is not excluded.   Electronically Signed   By: Kerby Moors M.D.   On: 03/31/2014 10:44    Labs:  CBC:  Recent Labs  04/04/14 0939 04/11/14 0913 04/24/14 1216 04/27/14 1120  WBC 10.3 8.0 4.8 8.8  HGB 12.7 11.7 8.6* 8.2*  HCT 37.1 34.6* 25.2* 24.0*  PLT 44* 391 13* 86*     COAGS:  Recent Labs  04/03/14 0740 04/27/14 1120  INR 1.25 1.23    BMP:  Recent Labs  03/21/14 0952 03/28/14 1211 04/11/14 0914 04/24/14 1216  NA 125* 131* 138 138  K 3.7 4.0 3.5 2.8*  CO2 24 28 28 26   GLUCOSE 115 103 100 107  BUN 15.4 22.6 16.6 35.6*  CALCIUM 10.5* 9.3 9.7 7.1*  CREATININE 1.3* 1.0 1.0 0.8    LIVER FUNCTION TESTS:  Recent Labs  03/21/14 0952 03/28/14 1211 04/11/14 0914 04/24/14 1216  BILITOT 0.80 1.34* 0.73 1.30*  AST 36* 24 15 13   ALT 31 14 8 12   ALKPHOS 109 98 243* 211*  PROT 7.5 6.9 6.8 6.6  ALBUMIN 4.0 3.9 3.5 3.4*   Assessment and Plan: Small cell lung cancer Scheduled for port a catheter with moderate sedaiton Patient has been NPO, no blood thinners taken, afebrile, wbc wnl Thrombocytopenia, s/p 1unit plt transfusion 12/9, plt 86k Risks and Benefits discussed with the patient. All of the patient's questions were answered, patient is agreeable to proceed. Consent signed and in chart.     SignedHedy Jacob 04/27/2014, 12:44 PM

## 2014-04-27 NOTE — Procedures (Signed)
Placement of right jugular portacath.  Tip in lower SVC.  Ready to use.  No immediate complication.

## 2014-04-27 NOTE — Discharge Instructions (Signed)
Implanted Port Home Guide °An implanted port is a type of central line that is placed under the skin. Central lines are used to provide IV access when treatment or nutrition needs to be given through a person's veins. Implanted ports are used for long-term IV access. An implanted port may be placed because:  °· You need IV medicine that would be irritating to the small veins in your hands or arms.   °· You need long-term IV medicines, such as antibiotics.   °· You need IV nutrition for a long period.   °· You need frequent blood draws for lab tests.   °· You need dialysis.   °Implanted ports are usually placed in the chest area, but they can also be placed in the upper arm, the abdomen, or the leg. An implanted port has two main parts:  °· Reservoir. The reservoir is round and will appear as a small, raised area under your skin. The reservoir is the part where a needle is inserted to give medicines or draw blood.   °· Catheter. The catheter is a thin, flexible tube that extends from the reservoir. The catheter is placed into a large vein. Medicine that is inserted into the reservoir goes into the catheter and then into the vein.   °HOW WILL I CARE FOR MY INCISION SITE? °Do not get the incision site wet. Bathe or shower as directed by your health care provider.  °HOW IS MY PORT ACCESSED? °Special steps must be taken to access the port:  °· Before the port is accessed, a numbing cream can be placed on the skin. This helps numb the skin over the port site.   °· Your health care provider uses a sterile technique to access the port. °· Your health care provider must put on a mask and sterile gloves. °· The skin over your port is cleaned carefully with an antiseptic and allowed to dry. °· The port is gently pinched between sterile gloves, and a needle is inserted into the port. °· Only "non-coring" port needles should be used to access the port. Once the port is accessed, a blood return should be checked. This helps  ensure that the port is in the vein and is not clogged.   °· If your port needs to remain accessed for a constant infusion, a clear (transparent) bandage will be placed over the needle site. The bandage and needle will need to be changed every week, or as directed by your health care provider.   °· Keep the bandage covering the needle clean and dry. Do not get it wet. Follow your health care provider's instructions on how to take a shower or bath while the port is accessed.   °· If your port does not need to stay accessed, no bandage is needed over the port.   °WHAT IS FLUSHING? °Flushing helps keep the port from getting clogged. Follow your health care provider's instructions on how and when to flush the port. Ports are usually flushed with saline solution or a medicine called heparin. The need for flushing will depend on how the port is used.  °· If the port is used for intermittent medicines or blood draws, the port will need to be flushed:   °· After medicines have been given.   °· After blood has been drawn.   °· As part of routine maintenance.   °· If a constant infusion is running, the port may not need to be flushed.   °HOW LONG WILL MY PORT STAY IMPLANTED? °The port can stay in for as long as your health care   provider thinks it is needed. When it is time for the port to come out, surgery will be done to remove it. The procedure is similar to the one performed when the port was put in.  WHEN SHOULD I SEEK IMMEDIATE MEDICAL CARE? When you have an implanted port, you should seek immediate medical care if:   You notice a bad smell coming from the incision site.   You have swelling, redness, or drainage at the incision site.   You have more swelling or pain at the port site or the surrounding area.   You have a fever that is not controlled with medicine. Document Released: 05/05/2005 Document Revised: 02/23/2013 Document Reviewed: 01/10/2013 [facility] Patient Information 2015 [place], [place]. This  information is not intended to replace advice given to you by your health care provider. Make sure you discuss any questions you have with your health care provider. Conscious Sedation, Adult, Care After Refer to this sheet in the next few weeks. These instructions provide you with information on caring for yourself after your procedure. Your health care provider may also give you more specific instructions. Your treatment has been planned according to current medical practices, but problems sometimes occur. Call your health care provider if you have any problems or questions after your procedure. WHAT TO EXPECT AFTER THE PROCEDURE  After your procedure:  You may feel sleepy, clumsy, and have poor balance for several hours.  Vomiting may occur if you eat too soon after the procedure. HOME CARE INSTRUCTIONS  Do not participate in any activities where you could become injured for at least 24 hours. Do not:  Drive.  Swim.  Ride a bicycle.  Operate heavy machinery.  Cook.  Use power tools.  Climb ladders.  Work from a high place.  Do not make important decisions or sign legal documents until you are improved.  If you vomit, drink water, juice, or soup when you can drink without vomiting. Make sure you have little or no nausea before eating solid foods.  Only take over-the-counter or prescription medicines for pain, discomfort, or fever as directed by your health care provider.  Make sure you and your family fully understand everything about the medicines given to you, including what side effects may occur.  You should not drink alcohol, take sleeping pills, or take medicines that cause drowsiness for at least 24 hours.  If you smoke, do not smoke without supervision.  If you are feeling better, you may resume normal activities 24 hours after you were sedated.  Keep all appointments with your health care provider. SEEK MEDICAL CARE IF:  Your skin is pale or bluish in  color.  You continue to feel nauseous or vomit.  Your pain is getting worse and is not helped by medicine.  You have bleeding or swelling.  You are still sleepy or feeling clumsy after 24 hours. SEEK IMMEDIATE MEDICAL CARE IF:  You develop a rash.  You have difficulty breathing.  You develop any type of allergic problem.  You have a fever. MAKE SURE YOU:  Understand these instructions.  Will watch your condition.  Will get help right away if you are not doing well or get worse. Document Released: 02/23/2013 Document Reviewed: 02/23/2013 [facility] Patient Information 2015 [place], [place]. This information is not intended to replace advice given to you by your health care provider. Make sure you discuss any questions you have with your health care provider.

## 2014-05-01 ENCOUNTER — Encounter (HOSPITAL_COMMUNITY): Payer: Self-pay

## 2014-05-01 ENCOUNTER — Other Ambulatory Visit (HOSPITAL_BASED_OUTPATIENT_CLINIC_OR_DEPARTMENT_OTHER): Payer: Medicare Other

## 2014-05-01 ENCOUNTER — Ambulatory Visit (HOSPITAL_COMMUNITY)
Admission: RE | Admit: 2014-05-01 | Discharge: 2014-05-01 | Disposition: A | Discharge status: Home / Self Care | Payer: Medicare Other | Source: Ambulatory Visit | Attending: Internal Medicine | Admitting: Internal Medicine

## 2014-05-01 ENCOUNTER — Ambulatory Visit (HOSPITAL_BASED_OUTPATIENT_CLINIC_OR_DEPARTMENT_OTHER): Payer: Medicare Other

## 2014-05-01 VITALS — BP 151/73 | HR 101 | Temp 98.0°F

## 2014-05-01 DIAGNOSIS — C3491 Malignant neoplasm of unspecified part of right bronchus or lung: Secondary | ICD-10-CM | POA: Insufficient documentation

## 2014-05-01 DIAGNOSIS — C342 Malignant neoplasm of middle lobe, bronchus or lung: Secondary | ICD-10-CM

## 2014-05-01 DIAGNOSIS — C7951 Secondary malignant neoplasm of bone: Secondary | ICD-10-CM | POA: Insufficient documentation

## 2014-05-01 DIAGNOSIS — Z95828 Presence of other vascular implants and grafts: Secondary | ICD-10-CM

## 2014-05-01 HISTORY — DX: Secondary malignant neoplasm of bone: C79.51

## 2014-05-01 LAB — CBC WITH DIFFERENTIAL/PLATELET
BASO%: 0.2 % (ref 0.0–2.0)
Basophils Absolute: 0 10*3/uL (ref 0.0–0.1)
EOS%: 0.1 % (ref 0.0–7.0)
Eosinophils Absolute: 0 10*3/uL (ref 0.0–0.5)
HCT: 22.5 % — ABNORMAL LOW (ref 34.8–46.6)
HGB: 7.5 g/dL — ABNORMAL LOW (ref 11.6–15.9)
LYMPH#: 2 10*3/uL (ref 0.9–3.3)
LYMPH%: 17.5 % (ref 14.0–49.7)
MCH: 27.1 pg (ref 25.1–34.0)
MCHC: 33.3 g/dL (ref 31.5–36.0)
MCV: 81.2 fL (ref 79.5–101.0)
MONO#: 1.5 10*3/uL — ABNORMAL HIGH (ref 0.1–0.9)
MONO%: 13.7 % (ref 0.0–14.0)
NEUT#: 7.7 10*3/uL — ABNORMAL HIGH (ref 1.5–6.5)
NEUT%: 68.5 % (ref 38.4–76.8)
Platelets: 142 10*3/uL — ABNORMAL LOW (ref 145–400)
RBC: 2.77 10*6/uL — ABNORMAL LOW (ref 3.70–5.45)
RDW: 14.7 % — AB (ref 11.2–14.5)
WBC: 11.3 10*3/uL — ABNORMAL HIGH (ref 3.9–10.3)

## 2014-05-01 LAB — COMPREHENSIVE METABOLIC PANEL (CC13)
ALBUMIN: 3.3 g/dL — AB (ref 3.5–5.0)
ALT: 12 U/L (ref 0–55)
ANION GAP: 13 meq/L — AB (ref 3–11)
AST: 15 U/L (ref 5–34)
Alkaline Phosphatase: 215 U/L — ABNORMAL HIGH (ref 40–150)
BUN: 9.8 mg/dL (ref 7.0–26.0)
CO2: 30 mEq/L — ABNORMAL HIGH (ref 22–29)
Calcium: 6.4 mg/dL — CL (ref 8.4–10.4)
Chloride: 92 mEq/L — ABNORMAL LOW (ref 98–109)
Creatinine: 0.8 mg/dL (ref 0.6–1.1)
EGFR: 79 mL/min/{1.73_m2} — ABNORMAL LOW (ref >90)
GLUCOSE: 91 mg/dL (ref 70–140)
POTASSIUM: 2.9 meq/L — AB (ref 3.5–5.1)
SODIUM: 135 meq/L — AB (ref 136–145)
TOTAL PROTEIN: 6.3 g/dL — AB (ref 6.4–8.3)
Total Bilirubin: 1.01 mg/dL (ref 0.20–1.20)

## 2014-05-01 MED ORDER — IOHEXOL 300 MG/ML  SOLN
50.0000 mL | Freq: Once | INTRAMUSCULAR | Status: AC | PRN
  Administered 2014-05-01: 50 mL via ORAL

## 2014-05-01 MED ORDER — IOHEXOL 300 MG/ML  SOLN
100.0000 mL | Freq: Once | INTRAMUSCULAR | Status: AC | PRN
  Administered 2014-05-01: 100 mL via INTRAVENOUS

## 2014-05-01 MED ORDER — HEPARIN SOD (PORK) LOCK FLUSH 100 UNIT/ML IV SOLN
500.0000 [IU] | Freq: Once | INTRAVENOUS | Status: AC
  Administered 2014-05-01: 500 [IU] via INTRAVENOUS
  Filled 2014-05-01: qty 5

## 2014-05-01 MED ORDER — SODIUM CHLORIDE 0.9 % IJ SOLN
10.0000 mL | INTRAMUSCULAR | Status: DC | PRN
  Administered 2014-05-01: 10 mL via INTRAVENOUS
  Filled 2014-05-01: qty 10

## 2014-05-01 NOTE — Patient Instructions (Signed)

## 2014-05-02 ENCOUNTER — Ambulatory Visit: Payer: Medicare Other

## 2014-05-02 ENCOUNTER — Ambulatory Visit (HOSPITAL_COMMUNITY)
Admission: RE | Admit: 2014-05-02 | Discharge: 2014-05-02 | Disposition: A | Discharge status: Home / Self Care | Payer: Medicare Other | Source: Ambulatory Visit | Attending: Internal Medicine | Admitting: Internal Medicine

## 2014-05-02 ENCOUNTER — Ambulatory Visit (HOSPITAL_BASED_OUTPATIENT_CLINIC_OR_DEPARTMENT_OTHER): Payer: Medicare Other | Admitting: Internal Medicine

## 2014-05-02 ENCOUNTER — Other Ambulatory Visit: Payer: Medicare Other

## 2014-05-02 ENCOUNTER — Telehealth: Payer: Self-pay | Admitting: Internal Medicine

## 2014-05-02 ENCOUNTER — Telehealth: Payer: Self-pay | Admitting: *Deleted

## 2014-05-02 ENCOUNTER — Encounter: Payer: Self-pay | Admitting: Internal Medicine

## 2014-05-02 ENCOUNTER — Other Ambulatory Visit: Payer: Self-pay | Admitting: Medical Oncology

## 2014-05-02 ENCOUNTER — Ambulatory Visit (HOSPITAL_BASED_OUTPATIENT_CLINIC_OR_DEPARTMENT_OTHER): Payer: Medicare Other

## 2014-05-02 ENCOUNTER — Other Ambulatory Visit: Payer: Self-pay | Admitting: *Deleted

## 2014-05-02 VITALS — BP 131/63 | HR 106 | Temp 98.0°F | Resp 18 | Ht 65.0 in | Wt 131.4 lb

## 2014-05-02 VITALS — BP 141/68 | HR 102 | Temp 96.8°F | Resp 17

## 2014-05-02 DIAGNOSIS — D696 Thrombocytopenia, unspecified: Secondary | ICD-10-CM

## 2014-05-02 DIAGNOSIS — C7951 Secondary malignant neoplasm of bone: Secondary | ICD-10-CM

## 2014-05-02 DIAGNOSIS — T451X5A Adverse effect of antineoplastic and immunosuppressive drugs, initial encounter: Principal | ICD-10-CM

## 2014-05-02 DIAGNOSIS — R197 Diarrhea, unspecified: Secondary | ICD-10-CM

## 2014-05-02 DIAGNOSIS — D6481 Anemia due to antineoplastic chemotherapy: Secondary | ICD-10-CM

## 2014-05-02 DIAGNOSIS — E860 Dehydration: Secondary | ICD-10-CM

## 2014-05-02 DIAGNOSIS — E876 Hypokalemia: Secondary | ICD-10-CM

## 2014-05-02 DIAGNOSIS — C342 Malignant neoplasm of middle lobe, bronchus or lung: Secondary | ICD-10-CM

## 2014-05-02 DIAGNOSIS — C3490 Malignant neoplasm of unspecified part of unspecified bronchus or lung: Secondary | ICD-10-CM | POA: Diagnosis not present

## 2014-05-02 DIAGNOSIS — R112 Nausea with vomiting, unspecified: Secondary | ICD-10-CM

## 2014-05-02 DIAGNOSIS — Z5111 Encounter for antineoplastic chemotherapy: Secondary | ICD-10-CM

## 2014-05-02 DIAGNOSIS — R251 Tremor, unspecified: Secondary | ICD-10-CM

## 2014-05-02 DIAGNOSIS — G893 Neoplasm related pain (acute) (chronic): Secondary | ICD-10-CM

## 2014-05-02 DIAGNOSIS — C3491 Malignant neoplasm of unspecified part of right bronchus or lung: Secondary | ICD-10-CM

## 2014-05-02 DIAGNOSIS — D630 Anemia in neoplastic disease: Secondary | ICD-10-CM

## 2014-05-02 LAB — PREPARE RBC (CROSSMATCH)

## 2014-05-02 MED ORDER — DEXAMETHASONE SODIUM PHOSPHATE 20 MG/5ML IJ SOLN
INTRAMUSCULAR | Status: AC
  Filled 2014-05-02: qty 5

## 2014-05-02 MED ORDER — ACETAMINOPHEN 325 MG PO TABS
ORAL_TABLET | ORAL | Status: AC
  Filled 2014-05-02: qty 2

## 2014-05-02 MED ORDER — ONDANSETRON 16 MG/50ML IVPB (CHCC)
16.0000 mg | Freq: Once | INTRAVENOUS | Status: AC
Start: 2014-05-02 — End: 2014-05-02
  Administered 2014-05-02: 16 mg via INTRAVENOUS

## 2014-05-02 MED ORDER — DIPHENHYDRAMINE HCL 25 MG PO CAPS
ORAL_CAPSULE | ORAL | Status: AC
  Filled 2014-05-02: qty 1

## 2014-05-02 MED ORDER — ONDANSETRON 16 MG/50ML IVPB (CHCC)
INTRAVENOUS | Status: AC
  Filled 2014-05-02: qty 16

## 2014-05-02 MED ORDER — SODIUM CHLORIDE 0.9 % IV SOLN
408.0000 mg | Freq: Once | INTRAVENOUS | Status: AC
  Administered 2014-05-02: 410 mg via INTRAVENOUS
  Filled 2014-05-02: qty 41

## 2014-05-02 MED ORDER — DEXAMETHASONE SODIUM PHOSPHATE 20 MG/5ML IJ SOLN
20.0000 mg | Freq: Once | INTRAMUSCULAR | Status: AC
  Administered 2014-05-02: 20 mg via INTRAVENOUS

## 2014-05-02 MED ORDER — ETOPOSIDE CHEMO INJECTION 1 GM/50ML
100.0000 mg/m2 | Freq: Once | INTRAVENOUS | Status: AC
  Administered 2014-05-02: 180 mg via INTRAVENOUS
  Filled 2014-05-02: qty 9

## 2014-05-02 MED ORDER — SODIUM CHLORIDE 0.9 % IJ SOLN
10.0000 mL | INTRAMUSCULAR | Status: DC | PRN
  Administered 2014-05-02: 10 mL
  Filled 2014-05-02: qty 10

## 2014-05-02 MED ORDER — HEPARIN SOD (PORK) LOCK FLUSH 100 UNIT/ML IV SOLN
500.0000 [IU] | Freq: Once | INTRAVENOUS | Status: AC | PRN
  Administered 2014-05-02: 500 [IU]
  Filled 2014-05-02: qty 5

## 2014-05-02 MED ORDER — ACETAMINOPHEN 325 MG PO TABS
650.0000 mg | ORAL_TABLET | Freq: Once | ORAL | Status: AC
  Administered 2014-05-02: 650 mg via ORAL

## 2014-05-02 MED ORDER — SODIUM CHLORIDE 0.9 % IV SOLN
Freq: Once | INTRAVENOUS | Status: AC
  Administered 2014-05-02: 12:00:00 via INTRAVENOUS

## 2014-05-02 MED ORDER — DIPHENHYDRAMINE HCL 25 MG PO CAPS
25.0000 mg | ORAL_CAPSULE | Freq: Once | ORAL | Status: AC
  Administered 2014-05-02: 25 mg via ORAL

## 2014-05-02 NOTE — Telephone Encounter (Signed)
Pt confirmed labs/ov/inj/pump per 12/15 POF, gave pt AVS.... KJ, sent msg to add chemo and s/w Seth Bake only Dr. Carles Collet sees pt's for tremors and once seen chart information referral pt will be call to sch apt.Marland KitchenMarland KitchenMarland Kitchen

## 2014-05-02 NOTE — Patient Instructions (Addendum)
[facility] Discharge Instructions for Patients Receiving Chemotherapy  Today you received the following chemotherapy agents: Carboplatin, Etoposide. PLEASE KEEP YOUR BLUE BRACELET ON FOR YOUR BLOOD TOMORROW.  To help prevent nausea and vomiting after your treatment, we encourage you to take your nausea medication as prescribed by your physician.  If you develop nausea and vomiting that is not controlled by your nausea medication, call the clinic.   BELOW ARE SYMPTOMS THAT SHOULD BE REPORTED IMMEDIATELY:  *FEVER GREATER THAN 100.5 F  *CHILLS WITH OR WITHOUT FEVER  NAUSEA AND VOMITING THAT IS NOT CONTROLLED WITH YOUR NAUSEA MEDICATION  *UNUSUAL SHORTNESS OF BREATH  *UNUSUAL BRUISING OR BLEEDING  TENDERNESS IN MOUTH AND THROAT WITH OR WITHOUT PRESENCE OF ULCERS  *URINARY PROBLEMS  *BOWEL PROBLEMS  UNUSUAL RASH Items with * indicate a potential emergency and should be followed up as soon as possible.  Feel free to call the clinic you have any questions or concerns. The clinic phone number is [phone].  Blood Transfusion Information WHAT IS A BLOOD TRANSFUSION? A transfusion is the replacement of blood or some of its parts. Blood is made up of multiple cells which provide different functions.  Red blood cells carry oxygen and are used for blood loss replacement.  White blood cells fight against infection.  Platelets control bleeding.  Plasma helps clot blood.  Other blood products are available for specialized needs, such as hemophilia or other clotting disorders. BEFORE THE TRANSFUSION  Who gives blood for transfusions?   You may be able to donate blood to be used at a later date on yourself (autologous donation).  Relatives can be asked to donate blood. This is generally not any safer than if you have received blood from a stranger. The same precautions are taken to ensure safety when a relative's blood is donated.  Healthy volunteers who are  fully evaluated to make sure their blood is safe. This is blood bank blood. Transfusion therapy is the safest it has ever been in the practice of medicine. Before blood is taken from a donor, a complete history is taken to make sure that person has no history of diseases nor engages in risky social behavior (examples are intravenous drug use or sexual activity with multiple partners). The donor's travel history is screened to minimize risk of transmitting infections, such as malaria. The donated blood is tested for signs of infectious diseases, such as HIV and hepatitis. The blood is then tested to be sure it is compatible with you in order to minimize the chance of a transfusion reaction. If you or a relative donates blood, this is often done in anticipation of surgery and is not appropriate for emergency situations. It takes many days to process the donated blood. RISKS AND COMPLICATIONS Although transfusion therapy is very safe and saves many lives, the main dangers of transfusion include:   Getting an infectious disease.  Developing a transfusion reaction. This is an allergic reaction to something in the blood you were given. Every precaution is taken to prevent this. The decision to have a blood transfusion has been considered carefully by your caregiver before blood is given. Blood is not given unless the benefits outweigh the risks. AFTER THE TRANSFUSION  Right after receiving a blood transfusion, you will usually feel much better and more energetic. This is especially true if your red blood cells have gotten low (anemic). The transfusion raises the level of the red blood cells which carry oxygen, and this usually causes an  energy increase.  The nurse administering the transfusion will monitor you carefully for complications. HOME CARE INSTRUCTIONS  No special instructions are needed after a transfusion. You may find your energy is better. Speak with your caregiver about any limitations on  activity for underlying diseases you may have. SEEK MEDICAL CARE IF:   Your condition is not improving after your transfusion.  You develop redness or irritation at the intravenous (IV) site. SEEK IMMEDIATE MEDICAL CARE IF:  Any of the following symptoms occur over the next 12 hours:  Shaking chills.  You have a temperature by mouth above 102 F (38.9 C), not controlled by medicine.  Chest, back, or muscle pain.  People around you feel you are not acting correctly or are confused.  Shortness of breath or difficulty breathing.  Dizziness and fainting.  You get a rash or develop hives.  You have a decrease in urine output.  Your urine turns a dark color or changes to pink, red, or brown. Any of the following symptoms occur over the next 10 days:  You have a temperature by mouth above 102 F (38.9 C), not controlled by medicine.  Shortness of breath.  Weakness after normal activity.  The white part of the eye turns yellow (jaundice).  You have a decrease in the amount of urine or are urinating less often.  Your urine turns a dark color or changes to pink, red, or brown. Document Released: 05/02/2000 Document Revised: 07/28/2011 Document Reviewed: 12/20/2007 [facility] Patient Information 2015 [place], [place]. This information is not intended to replace advice given to you by your health care provider. Make sure you discuss any questions you have with your health care provider.

## 2014-05-02 NOTE — Telephone Encounter (Signed)
per outlook email from [place] moved 12/16 inf from 3pm to 1145am. s/w pt she is aware.

## 2014-05-02 NOTE — Progress Notes (Signed)
[place] [phone]   [phone]) [phone]  OFFICE PROGRESS NOTE  [name], MD [address] [place] [address]  DIAGNOSIS: Extensive stage, stage IV (T3, N2, M1b) small cell lung cancer diagnosed in October 2015.  PRIOR THERAPY: None  CURRENT THERAPY: Systemic chemotherapy with carboplatin for AUC of 5 on day 1 and etoposide 100 MG/M2 on days 1, 2 and 3 with Neulasta support on day 4. Status post 2 cycles.  INTERVAL HISTORY: [name] 73 y.o. female returns to the clinic today for follow-up visit accompanied by her husband. The patient tolerated the last cycle of her systemic chemotherapy with carboplatin and etoposide fairly well except for mild fatigue. She denied having any significant nausea or vomiting, no fever or chills. The patient denied having any significant chest pain but continues to have shortness breath with exertion was no cough or hemoptysis. She had a Port-A-Cath placed recently for IV access. She had repeat CT scan of the chest, abdomen and pelvis performed recently and she is here for evaluation and discussion of her scan results.  MEDICAL HISTORY: Past Medical History  Diagnosis Date  . GERD (gastroesophageal reflux disease)   . IBS (irritable bowel syndrome)   . Adenomatous colon polyp 06/2009  . Hiatal hernia   . Breast cyst   . Diverticulosis   . Hemorrhoids   . Hyperlipidemia   . Hypertension   . Hyperparathyroidism   . Anxiety   . Migraine   . Osteoporosis   . Vitamin D deficiency   . Spinal stenosis   . C. difficile colitis 2006  . Small cell lung carcinoma dx'd 02/2014  . Bone metastases dx'd 02/2014    ALLERGIES:  is allergic to ace inhibitors; cephalexin; etodolac; and gabapentin.  MEDICATIONS:  Current Outpatient Prescriptions  Medication Sig Dispense Refill  . ALPRAZolam (XANAX) 0.5 MG tablet Take 0.5 mg by mouth at bedtime.      Marland Kitchen atorvastatin (LIPITOR) 80 MG tablet Take 80 mg by mouth every  morning.     Marland Kitchen buPROPion (WELLBUTRIN SR) 150 MG 12 hr tablet Take 150 mg by mouth every morning.     Marland Kitchen dexlansoprazole (DEXILANT) 60 MG capsule TAKE 1 CAPSULE (60 MG TOTAL) BY MOUTH DAILY. (Patient taking differently: Take 60 mg by mouth every morning. ) 30 capsule 11  . Diclofenac (ZORVOLEX) 35 MG CAPS Take 35 mg by mouth every morning.     Marland Kitchen glycopyrrolate (ROBINUL) 2 MG tablet TAKE 1 TABLET (2 MG TOTAL) BY MOUTH 2 (TWO) TIMES DAILY. 60 tablet 11  . hydrochlorothiazide (HYDRODIURIL) 12.5 MG tablet Take 1 tablet by mouth every morning.     . lidocaine-prilocaine (EMLA) cream Apply 1 application topically as needed. Apply to port 1 hr before chemo 30 g 0  . LORazepam (ATIVAN) 0.5 MG tablet Take 1 tablet (0.5 mg total) by mouth every 8 (eight) hours. 30 tablet 0  . metoprolol (TOPROL-XL) 50 MG 24 hr tablet Take 50 mg by mouth every morning.     Marland Kitchen oxycodone (OXY-IR) 5 MG capsule 1-2 every 4 hours as needed for back pain (Patient taking differently: Take 5-10 mg by mouth every 4 (four) hours as needed for pain. ) 90 capsule 0  . Polyethylene Glycol 3350 (MIRALAX PO) Take 1 scoop by mouth daily as needed (for constipation).     . potassium chloride SA (K-DUR,KLOR-CON) 20 MEQ tablet Take 1 tablet (20 mEq total) by mouth daily. 30 tablet 0  .  pregabalin (LYRICA) 50 MG capsule Take 50 mg by mouth 2 (two) times daily.     . prochlorperazine (COMPAZINE) 10 MG tablet Take 1 tablet (10 mg total) by mouth every 6 (six) hours as needed for nausea or vomiting. 30 tablet 1  . traMADol (ULTRAM) 50 MG tablet Take 50 mg by mouth 3 (three) times daily as needed for moderate pain.      No current facility-administered medications for this visit.    SURGICAL HISTORY:  Past Surgical History  Procedure Laterality Date  . Cholecystectomy    . Vaginal hysterectomy    . Knee arthroscopy      left  . Parathyroidectomy  2009    [facility]  . Lumbar spine surgery  2001  . Breast biopsy      bilateral  . Total  knee arthroplasty      right  . Bladder repair    . Video bronchoscopy Bilateral 03/14/2014    Procedure: VIDEO BRONCHOSCOPY WITHOUT FLUORO;  Surgeon: Tanda Rockers, MD;  Location: [facility];  Service: Cardiopulmonary;  Laterality: Bilateral;    REVIEW OF SYSTEMS:  Constitutional: positive for anorexia, fatigue and weight loss Eyes: negative Ears, nose, mouth, throat, and face: negative Respiratory: positive for dyspnea on exertion Cardiovascular: negative Gastrointestinal: negative Genitourinary:negative Integument/breast: negative Hematologic/lymphatic: negative Musculoskeletal:negative Neurological: positive for tremors Behavioral/Psych: negative Endocrine: negative Allergic/Immunologic: negative   PHYSICAL EXAMINATION: General appearance: alert, cooperative, fatigued and no distress Head: Normocephalic, without obvious abnormality, atraumatic Neck: no adenopathy, no JVD, supple, symmetrical, trachea midline and thyroid not enlarged, symmetric, no tenderness/mass/nodules Lymph nodes: Cervical, supraclavicular, and axillary nodes normal. Resp: clear to auscultation bilaterally Back: symmetric, no curvature. ROM normal. No CVA tenderness. Cardio: regular rate and rhythm, S1, S2 normal, no murmur, click, rub or gallop GI: soft, non-tender; bowel sounds normal; no masses,  no organomegaly Extremities: extremities normal, atraumatic, no cyanosis or edema Neurologic: Alert and oriented X 3, normal strength and tone. Normal symmetric reflexes. Normal coordination and gait  ECOG PERFORMANCE STATUS: 1 - Symptomatic but completely ambulatory  Blood pressure 131/63, pulse 106, temperature 98 F (36.7 C), temperature source Oral, resp. rate 18, height 5\' 5"  (1.651 m), weight 131 lb 6.4 oz (59.603 kg), SpO2 99 %.  LABORATORY DATA: Lab Results  Component Value Date   WBC 11.3* 05/01/2014   HGB 7.5* 05/01/2014   HCT 22.5* 05/01/2014   MCV 81.2 05/01/2014   PLT 142* 05/01/2014       Chemistry      Component Value Date/Time   NA 135* 05/01/2014 1248   NA 130* 02/02/2010 0500   K 2.9* 05/01/2014 1248   K 3.7 02/02/2010 0500   CL 95* 02/02/2010 0500   CO2 30* 05/01/2014 1248   CO2 27 02/02/2010 0500   BUN 9.8 05/01/2014 1248   BUN 8 02/02/2010 0500   CREATININE 0.8 05/01/2014 1248   CREATININE 0.86 02/02/2010 0500      Component Value Date/Time   CALCIUM 6.4* 05/01/2014 1248   CALCIUM 8.4 02/02/2010 0500   ALKPHOS 215* 05/01/2014 1248   ALKPHOS 112 01/25/2010 1100   AST 15 05/01/2014 1248   AST 27 01/25/2010 1100   ALT 12 05/01/2014 1248   ALT 33 01/25/2010 1100   BILITOT 1.01 05/01/2014 1248   BILITOT 0.9 01/25/2010 1100       RADIOGRAPHIC STUDIES: Ct Chest W Contrast  05/01/2014   CLINICAL DATA:  Subsequent treatment strategy for squamous cell carcinoma with bone metastasis.  EXAM: CT  CHEST, ABDOMEN, AND PELVIS WITH CONTRAST  TECHNIQUE: Multidetector CT imaging of the chest, abdomen and pelvis was performed following the standard protocol during bolus administration of intravenous contrast.  CONTRAST:  129mL OMNIPAQUE IOHEXOL 300 MG/ML  SOLN  COMPARISON:  02/20/2014  FINDINGS: CT CHEST FINDINGS  Mediastinum: The heart size appears normal. No pericardial effusion identified. Calcified atherosclerotic disease involves the thoracic aorta. Calcification within the RCA Coronary artery also noted. Index right paratracheal lymph node measures 1.5 cm, image 24/ series 2. Previously 2.5 cm. Sub- carinal lymph node measures 1.5 cm, image 30/ series 2. Previously 2.8 cm. The right hilar lymph node measures 1.9 cm, image 28/ series 2. Previously 3.1 cm.  Lungs/Pleura: There is no pleural effusion identified. Moderate changes of centrilobular and paraseptal emphysema are noted in both upper and lower lobes. Bilateral pulmonary nodules are identified compatible with metastatic disease. Index nodule in the left lower lobe measures 9 mm, image 46/ series 4. This is new  from the previous exam. Also new from the prior exam is a 9 mm left lower lobe nodule, image 42/series 4.Improvement in pleural nodularity overlying the right lung. The index lesion within the posterior medial right lower lobe measures 1.1 x 0.5 cm, image 32/series 4. Previously 2.5 x 1.1 cm.  Musculoskeletal: Review of the visualized osseous structures shows multi focal areas of bone metastases involving the thoracic and lumbar spine. Pathologic fracture involving the T9 vertebra has progressed from the previous exam. There is a new lucent lesion involving the posterior aspect of the T8 vertebra measuring 1.2 cm, image 61 of 603. Mixed lytic and sclerotic bone lesion involving T12 appears stable.  CT ABDOMEN AND PELVIS FINDINGS  Hepatobiliary: There is no suspicious liver abnormality identified. Previous cholecystectomy.  Pancreas: Normal appearance of the pancreas.  Spleen: The spleen appears normal.  Adrenals/Urinary Tract: 7 mm right adrenal nodule is unchanged. The 1.1 cm right adrenal nodule is also stable. Normal appearance of the left adrenal gland. Bilateral renal cysts are identified. The urinary bladder appears normal.  Stomach/Bowel: The stomach appears normal. The small bowel loops have a normal course and caliber without obstruction. Normal appearance of the appendix. Multiple colonic diverticula identified without acute inflammation.  Vascular/Lymphatic: Calcified atherosclerotic disease involves the abdominal aorta. There is no aneurysm. The aorta has a maximum AP dimension of 2.4 cm. No enlarged retroperitoneal or mesenteric adenopathy. No enlarged pelvic or inguinal lymph nodes.  Reproductive: Previous hysterectomy.  There is no adnexal mass.  Other: There is no ascites or focal fluid collections within the abdomen or pelvis.  Musculoskeletal: Multi focal bone metastases are again identified. The patient is status post posterior hardware fixation of the lower lumbar spine. There is a lytic lesion  involving the right iliac wing measuring 1.6 cm, image 88/ series 2. Previously 1.3 cm. 2.8 cm sclerotic lesion within the posterior right iliac bone is identified. Unchanged from previous study.  IMPRESSION: 1. No acute findings within the chest abdomen or pelvis. 2. Mixed interval response to therapy. There has been decrease in size of right hilar mass and mediastinal lymph nodes. Additionally, pleural nodularity overlying the right lung has decreased in the interval. 3. There are several new pulmonary nodules in the left lower lobe, worrisome for metastatic disease. 4. Multi focal bone metastasis are again noted. These appear stable to slightly progressed from previous exam. Specifically, there is a lesion in the right iliac wing which is slightly increased in the interval. There is also a lesion involving T8  which appears new from 03/02/2014. Progression of pathologic T9 fracture.   Electronically Signed   By: Kerby Moors M.D.   On: 05/01/2014 15:10   Ct Abdomen Pelvis W Contrast  05/01/2014   CLINICAL DATA:  Subsequent treatment strategy for squamous cell carcinoma with bone metastasis.  EXAM: CT CHEST, ABDOMEN, AND PELVIS WITH CONTRAST  TECHNIQUE: Multidetector CT imaging of the chest, abdomen and pelvis was performed following the standard protocol during bolus administration of intravenous contrast.  CONTRAST:  144mL OMNIPAQUE IOHEXOL 300 MG/ML  SOLN  COMPARISON:  02/20/2014  FINDINGS: CT CHEST FINDINGS  Mediastinum: The heart size appears normal. No pericardial effusion identified. Calcified atherosclerotic disease involves the thoracic aorta. Calcification within the RCA Coronary artery also noted. Index right paratracheal lymph node measures 1.5 cm, image 24/ series 2. Previously 2.5 cm. Sub- carinal lymph node measures 1.5 cm, image 30/ series 2. Previously 2.8 cm. The right hilar lymph node measures 1.9 cm, image 28/ series 2. Previously 3.1 cm.  Lungs/Pleura: There is no pleural effusion  identified. Moderate changes of centrilobular and paraseptal emphysema are noted in both upper and lower lobes. Bilateral pulmonary nodules are identified compatible with metastatic disease. Index nodule in the left lower lobe measures 9 mm, image 46/ series 4. This is new from the previous exam. Also new from the prior exam is a 9 mm left lower lobe nodule, image 42/series 4.Improvement in pleural nodularity overlying the right lung. The index lesion within the posterior medial right lower lobe measures 1.1 x 0.5 cm, image 32/series 4. Previously 2.5 x 1.1 cm.  Musculoskeletal: Review of the visualized osseous structures shows multi focal areas of bone metastases involving the thoracic and lumbar spine. Pathologic fracture involving the T9 vertebra has progressed from the previous exam. There is a new lucent lesion involving the posterior aspect of the T8 vertebra measuring 1.2 cm, image 61 of 603. Mixed lytic and sclerotic bone lesion involving T12 appears stable.  CT ABDOMEN AND PELVIS FINDINGS  Hepatobiliary: There is no suspicious liver abnormality identified. Previous cholecystectomy.  Pancreas: Normal appearance of the pancreas.  Spleen: The spleen appears normal.  Adrenals/Urinary Tract: 7 mm right adrenal nodule is unchanged. The 1.1 cm right adrenal nodule is also stable. Normal appearance of the left adrenal gland. Bilateral renal cysts are identified. The urinary bladder appears normal.  Stomach/Bowel: The stomach appears normal. The small bowel loops have a normal course and caliber without obstruction. Normal appearance of the appendix. Multiple colonic diverticula identified without acute inflammation.  Vascular/Lymphatic: Calcified atherosclerotic disease involves the abdominal aorta. There is no aneurysm. The aorta has a maximum AP dimension of 2.4 cm. No enlarged retroperitoneal or mesenteric adenopathy. No enlarged pelvic or inguinal lymph nodes.  Reproductive: Previous hysterectomy.  There is no  adnexal mass.  Other: There is no ascites or focal fluid collections within the abdomen or pelvis.  Musculoskeletal: Multi focal bone metastases are again identified. The patient is status post posterior hardware fixation of the lower lumbar spine. There is a lytic lesion involving the right iliac wing measuring 1.6 cm, image 88/ series 2. Previously 1.3 cm. 2.8 cm sclerotic lesion within the posterior right iliac bone is identified. Unchanged from previous study.  IMPRESSION: 1. No acute findings within the chest abdomen or pelvis. 2. Mixed interval response to therapy. There has been decrease in size of right hilar mass and mediastinal lymph nodes. Additionally, pleural nodularity overlying the right lung has decreased in the interval. 3. There are several  new pulmonary nodules in the left lower lobe, worrisome for metastatic disease. 4. Multi focal bone metastasis are again noted. These appear stable to slightly progressed from previous exam. Specifically, there is a lesion in the right iliac wing which is slightly increased in the interval. There is also a lesion involving T8 which appears new from 03/02/2014. Progression of pathologic T9 fracture.   Electronically Signed   By: Kerby Moors M.D.   On: 05/01/2014 15:10   Ir Fluoro Guide Cv Line Right  04/27/2014   CLINICAL DATA:  73 year old with small cell carcinoma of the lung. Port-A-Cath needed for chemotherapy.  EXAM: FLUOROSCOPIC AND ULTRASOUND GUIDED PLACEMENT OF A SUBCUTANEOUS PORT.  Physician: Stephan Minister. Henn, MD  FLUOROSCOPY TIME:  18 seconds, 2 mGy  MEDICATIONS AND MEDICAL HISTORY: Vancomycin 1 g, 2 mg versed, 100 mcg fentanyl. Vancomycin was given within two hours of incision. Vancomycin was given due to a cephalosporin allergy. A radiology nurse monitored the patient for moderate sedation.  ANESTHESIA/SEDATION: Moderate sedation time: 40 minutes  PROCEDURE: The risks of the procedure were explained to the patient. Informed consent was obtained.  Patient was placed supine on the interventional table. Ultrasound confirmed a patent right internal jugular vein. The right chest and neck were cleaned with a skin antiseptic and a sterile drape was placed. Maximal barrier sterile technique was utilized including caps, mask, sterile gowns, sterile gloves, sterile drape, hand hygiene and skin antiseptic. The right neck was anesthetized with 1% lidocaine. Small incision was made in the right neck with a blade. Micropuncture set was placed in the right internal jugular vein with ultrasound guidance. The micropuncture wire was used for measurement purposes. The right chest was anesthetized with 1% lidocaine with epinephrine. #15 blade was used to make an incision and a subcutaneous port pocket was formed. [place] was assembled. Subcutaneous tunnel was formed with a stiff tunneling device. The port catheter was brought through the subcutaneous tunnel. The port was placed in the subcutaneous pocket. The micropuncture set was exchanged for a peel-away sheath. The catheter was placed through the peel-away sheath and the tip was positioned at the superior cavoatrial junction. Catheter placement was confirmed with fluoroscopy. The port was accessed and flushed with heparinized saline. The port pocket was closed using two layers of absorbable sutures and Dermabond. The vein skin site was closed using a single layer of absorbable suture and Dermabond. Sterile dressings were applied. Patient tolerated the procedure well without an immediate complication. Ultrasound and fluoroscopic images were taken and saved for this procedure.  COMPLICATIONS: None  IMPRESSION: Placement of a subcutaneous port device. The catheter tip at the superior cavoatrial junction and ready to be used.   Electronically Signed   By: Markus Daft M.D.   On: 04/27/2014 19:12   Ir US Guide Vasc Access Right  04/27/2014   CLINICAL DATA:  73 year old with small cell carcinoma of the lung.  Port-A-Cath needed for chemotherapy.  EXAM: FLUOROSCOPIC AND ULTRASOUND GUIDED PLACEMENT OF A SUBCUTANEOUS PORT.  Physician: Stephan Minister. Henn, MD  FLUOROSCOPY TIME:  18 seconds, 2 mGy  MEDICATIONS AND MEDICAL HISTORY: Vancomycin 1 g, 2 mg versed, 100 mcg fentanyl. Vancomycin was given within two hours of incision. Vancomycin was given due to a cephalosporin allergy. A radiology nurse monitored the patient for moderate sedation.  ANESTHESIA/SEDATION: Moderate sedation time: 40 minutes  PROCEDURE: The risks of the procedure were explained to the patient. Informed consent was obtained. Patient was placed supine on the interventional  table. Ultrasound confirmed a patent right internal jugular vein. The right chest and neck were cleaned with a skin antiseptic and a sterile drape was placed. Maximal barrier sterile technique was utilized including caps, mask, sterile gowns, sterile gloves, sterile drape, hand hygiene and skin antiseptic. The right neck was anesthetized with 1% lidocaine. Small incision was made in the right neck with a blade. Micropuncture set was placed in the right internal jugular vein with ultrasound guidance. The micropuncture wire was used for measurement purposes. The right chest was anesthetized with 1% lidocaine with epinephrine. #15 blade was used to make an incision and a subcutaneous port pocket was formed. [place] was assembled. Subcutaneous tunnel was formed with a stiff tunneling device. The port catheter was brought through the subcutaneous tunnel. The port was placed in the subcutaneous pocket. The micropuncture set was exchanged for a peel-away sheath. The catheter was placed through the peel-away sheath and the tip was positioned at the superior cavoatrial junction. Catheter placement was confirmed with fluoroscopy. The port was accessed and flushed with heparinized saline. The port pocket was closed using two layers of absorbable sutures and Dermabond. The vein skin site was  closed using a single layer of absorbable suture and Dermabond. Sterile dressings were applied. Patient tolerated the procedure well without an immediate complication. Ultrasound and fluoroscopic images were taken and saved for this procedure.  COMPLICATIONS: None  IMPRESSION: Placement of a subcutaneous port device. The catheter tip at the superior cavoatrial junction and ready to be used.   Electronically Signed   By: Markus Daft M.D.   On: 04/27/2014 19:12    ASSESSMENT AND PLAN: This is a very pleasant 73 years old white female with:  1) Extensive stage small cell lung cancer currently undergoing systemic chemotherapy with carboplatin and etoposide status post 2 cycles. The patient is tolerating her treatment fairly well except for mild fatigue, alopecia and new tremors.  The recent CT scan of the chest showed significant improvement in her disease in the chest except for some new pulmonary nodules concerning for metastatic disease. These could be inflammatory in origin especially with the good response in the other lesions. She also has some concerning findings in the bone but healing process in the bone could show up as disease progression. She is currently on treatment with Xgeva. I discussed the scan results with the patient and her husband and showed them the images. I recommended for her to continue her current treatment with carboplatin and etoposide as scheduled. She would proceed with cycle #3 today.  2) metastatic bone disease: The patient will continue on Xgeva 120 mcg subcutaneously every 4 weeks. The patient has no dental issues and currently has dentures. She was also advised to take calcium and vitamin D supplements on daily basis.  3) chemotherapy-induced anemia, I will arrange for the patient to receive 2 units of PRBCs transfusion this week.  4) For the tremors, she will continue on Ativan 0.5 mg by mouth every 8 hours as needed.  5) Hypokalemia: The patient was advised to  increase her dose of K Dur to 40 mEq by mouth daily for the next 10 days.  The patient was advised to call immediately if she has any concerning symptoms in the interval. The patient voices understanding of current disease status and treatment options and is in agreement with the current care plan.  All questions were answered. The patient knows to call the clinic with any problems, questions or concerns. We  can certainly see the patient much sooner if necessary.   Disclaimer: This note was dictated with voice recognition software. Similar sounding words can inadvertently be transcribed and may not be corrected upon review.

## 2014-05-02 NOTE — Progress Notes (Signed)
Per. Dr. Julien Nordmann, okay to tx with hgb 7.5- patient to receive 2 units PRBCs and chemo today.

## 2014-05-02 NOTE — Telephone Encounter (Signed)
Late Entry: 05/01/14.    Ca 6.4 and K 2.9.  Per dr Vista Mink pt needs to take 76meq of K for 7 days, then resume Kdur at 28meq daily.  Pt also need to start taking Calcium 1200mg  daily.  Truett Mainland attempted to call patient 05/01/14.  Left msg.    On 05/02/14, pt in the office for a follow up appt.  Pt states she did not get a chance to call us back yesterday.  Wrote down instructions for Potassium and Calcium.  Pt verbalized understanding.

## 2014-05-02 NOTE — Progress Notes (Signed)
Patient to receive 1 unit blood tomorrow. 1 unit given today. Patient and husband reminded to keep blue bracelet on for blood tomorrow; both voice understanding. PAC left accessed per patient request for tomorrow.

## 2014-05-03 ENCOUNTER — Ambulatory Visit: Payer: Medicare Other

## 2014-05-03 ENCOUNTER — Ambulatory Visit (HOSPITAL_BASED_OUTPATIENT_CLINIC_OR_DEPARTMENT_OTHER): Payer: Medicare Other

## 2014-05-03 VITALS — BP 131/64 | HR 98 | Temp 98.4°F | Resp 18

## 2014-05-03 DIAGNOSIS — D6481 Anemia due to antineoplastic chemotherapy: Secondary | ICD-10-CM

## 2014-05-03 DIAGNOSIS — Z5111 Encounter for antineoplastic chemotherapy: Secondary | ICD-10-CM

## 2014-05-03 DIAGNOSIS — C7951 Secondary malignant neoplasm of bone: Secondary | ICD-10-CM

## 2014-05-03 DIAGNOSIS — C342 Malignant neoplasm of middle lobe, bronchus or lung: Secondary | ICD-10-CM

## 2014-05-03 DIAGNOSIS — C3491 Malignant neoplasm of unspecified part of right bronchus or lung: Secondary | ICD-10-CM

## 2014-05-03 DIAGNOSIS — T451X5A Adverse effect of antineoplastic and immunosuppressive drugs, initial encounter: Principal | ICD-10-CM

## 2014-05-03 MED ORDER — HEPARIN SOD (PORK) LOCK FLUSH 100 UNIT/ML IV SOLN
500.0000 [IU] | Freq: Once | INTRAVENOUS | Status: AC | PRN
  Administered 2014-05-03: 500 [IU]
  Filled 2014-05-03: qty 5

## 2014-05-03 MED ORDER — DIPHENHYDRAMINE HCL 25 MG PO CAPS
25.0000 mg | ORAL_CAPSULE | Freq: Once | ORAL | Status: AC
  Administered 2014-05-03: 25 mg via ORAL

## 2014-05-03 MED ORDER — PROCHLORPERAZINE MALEATE 10 MG PO TABS
10.0000 mg | ORAL_TABLET | Freq: Once | ORAL | Status: AC
  Administered 2014-05-03: 10 mg via ORAL

## 2014-05-03 MED ORDER — SODIUM CHLORIDE 0.9 % IV SOLN
100.0000 mg/m2 | Freq: Once | INTRAVENOUS | Status: AC
  Administered 2014-05-03: 180 mg via INTRAVENOUS
  Filled 2014-05-03: qty 9

## 2014-05-03 MED ORDER — PROCHLORPERAZINE MALEATE 10 MG PO TABS
ORAL_TABLET | ORAL | Status: AC
  Filled 2014-05-03: qty 1

## 2014-05-03 MED ORDER — DIPHENHYDRAMINE HCL 25 MG PO CAPS
ORAL_CAPSULE | ORAL | Status: AC
  Filled 2014-05-03: qty 1

## 2014-05-03 MED ORDER — ACETAMINOPHEN 325 MG PO TABS
ORAL_TABLET | ORAL | Status: AC
  Filled 2014-05-03: qty 2

## 2014-05-03 MED ORDER — SODIUM CHLORIDE 0.9 % IJ SOLN
10.0000 mL | INTRAMUSCULAR | Status: DC | PRN
  Administered 2014-05-03 (×2): 10 mL
  Filled 2014-05-03: qty 10

## 2014-05-03 MED ORDER — SODIUM CHLORIDE 0.9 % IV SOLN
Freq: Once | INTRAVENOUS | Status: AC
  Administered 2014-05-03: 11:00:00 via INTRAVENOUS

## 2014-05-03 MED ORDER — HEPARIN SOD (PORK) LOCK FLUSH 100 UNIT/ML IV SOLN
500.0000 [IU] | Freq: Once | INTRAVENOUS | Status: AC
  Administered 2014-05-03: 500 [IU] via INTRAVENOUS
  Filled 2014-05-03: qty 5

## 2014-05-03 MED ORDER — ACETAMINOPHEN 325 MG PO TABS
650.0000 mg | ORAL_TABLET | Freq: Once | ORAL | Status: AC
  Administered 2014-05-03: 650 mg via ORAL

## 2014-05-03 MED ORDER — SODIUM CHLORIDE 0.9 % IV SOLN
250.0000 mL | Freq: Once | INTRAVENOUS | Status: AC
  Administered 2014-05-03: 250 mL via INTRAVENOUS

## 2014-05-03 NOTE — Patient Instructions (Addendum)
[facility] Discharge Instructions for Patients Receiving Chemotherapy  Today you received the following chemotherapy agents:  Etoposide.  To help prevent nausea and vomiting after your treatment, we encourage you to take your nausea medication: Compazine 10 mg every 6 hours as needed.   If you develop nausea and vomiting that is not controlled by your nausea medication, call the clinic.   BELOW ARE SYMPTOMS THAT SHOULD BE REPORTED IMMEDIATELY:  *FEVER GREATER THAN 100.5 F  *CHILLS WITH OR WITHOUT FEVER  NAUSEA AND VOMITING THAT IS NOT CONTROLLED WITH YOUR NAUSEA MEDICATION  *UNUSUAL SHORTNESS OF BREATH  *UNUSUAL BRUISING OR BLEEDING  TENDERNESS IN MOUTH AND THROAT WITH OR WITHOUT PRESENCE OF ULCERS  *URINARY PROBLEMS  *BOWEL PROBLEMS  UNUSUAL RASH Items with * indicate a potential emergency and should be followed up as soon as possible.  Feel free to call the clinic you have any questions or concerns. The clinic phone number is [phone].   Blood Transfusion Information WHAT IS A BLOOD TRANSFUSION? A transfusion is the replacement of blood or some of its parts. Blood is made up of multiple cells which provide different functions.  Red blood cells carry oxygen and are used for blood loss replacement.  White blood cells fight against infection.  Platelets control bleeding.  Plasma helps clot blood.  Other blood products are available for specialized needs, such as hemophilia or other clotting disorders. BEFORE THE TRANSFUSION  Who gives blood for transfusions?   You may be able to donate blood to be used at a later date on yourself (autologous donation).  Relatives can be asked to donate blood. This is generally not any safer than if you have received blood from a stranger. The same precautions are taken to ensure safety when a relative's blood is donated.  Healthy volunteers who are fully evaluated to make sure their blood is safe. This is blood  bank blood. Transfusion therapy is the safest it has ever been in the practice of medicine. Before blood is taken from a donor, a complete history is taken to make sure that person has no history of diseases nor engages in risky social behavior (examples are intravenous drug use or sexual activity with multiple partners). The donor's travel history is screened to minimize risk of transmitting infections, such as malaria. The donated blood is tested for signs of infectious diseases, such as HIV and hepatitis. The blood is then tested to be sure it is compatible with you in order to minimize the chance of a transfusion reaction. If you or a relative donates blood, this is often done in anticipation of surgery and is not appropriate for emergency situations. It takes many days to process the donated blood. RISKS AND COMPLICATIONS Although transfusion therapy is very safe and saves many lives, the main dangers of transfusion include:   Getting an infectious disease.  Developing a transfusion reaction. This is an allergic reaction to something in the blood you were given. Every precaution is taken to prevent this. The decision to have a blood transfusion has been considered carefully by your caregiver before blood is given. Blood is not given unless the benefits outweigh the risks. AFTER THE TRANSFUSION  Right after receiving a blood transfusion, you will usually feel much better and more energetic. This is especially true if your red blood cells have gotten low (anemic). The transfusion raises the level of the red blood cells which carry oxygen, and this usually causes an energy increase.  The nurse  administering the transfusion will monitor you carefully for complications. HOME CARE INSTRUCTIONS  No special instructions are needed after a transfusion. You may find your energy is better. Speak with your caregiver about any limitations on activity for underlying diseases you may have. SEEK MEDICAL CARE  IF:   Your condition is not improving after your transfusion.  You develop redness or irritation at the intravenous (IV) site. SEEK IMMEDIATE MEDICAL CARE IF:  Any of the following symptoms occur over the next 12 hours:  Shaking chills.  You have a temperature by mouth above 102 F (38.9 C), not controlled by medicine.  Chest, back, or muscle pain.  People around you feel you are not acting correctly or are confused.  Shortness of breath or difficulty breathing.  Dizziness and fainting.  You get a rash or develop hives.  You have a decrease in urine output.  Your urine turns a dark color or changes to pink, red, or brown. Any of the following symptoms occur over the next 10 days:  You have a temperature by mouth above 102 F (38.9 C), not controlled by medicine.  Shortness of breath.  Weakness after normal activity.  The white part of the eye turns yellow (jaundice).  You have a decrease in the amount of urine or are urinating less often.  Your urine turns a dark color or changes to pink, red, or brown. Document Released: 05/02/2000 Document Revised: 07/28/2011 Document Reviewed: 12/20/2007 [facility] Patient Information 2015 [place], [place]. This information is not intended to replace advice given to you by your health care provider. Make sure you discuss any questions you have with your health care provider.

## 2014-05-04 ENCOUNTER — Ambulatory Visit: Payer: Medicare Other | Admitting: Nutrition

## 2014-05-04 ENCOUNTER — Ambulatory Visit (HOSPITAL_BASED_OUTPATIENT_CLINIC_OR_DEPARTMENT_OTHER): Payer: Medicare Other

## 2014-05-04 DIAGNOSIS — C7951 Secondary malignant neoplasm of bone: Secondary | ICD-10-CM

## 2014-05-04 DIAGNOSIS — C3491 Malignant neoplasm of unspecified part of right bronchus or lung: Secondary | ICD-10-CM

## 2014-05-04 DIAGNOSIS — Z5111 Encounter for antineoplastic chemotherapy: Secondary | ICD-10-CM

## 2014-05-04 DIAGNOSIS — C342 Malignant neoplasm of middle lobe, bronchus or lung: Secondary | ICD-10-CM

## 2014-05-04 LAB — TYPE AND SCREEN
ABO/RH(D): O POS
Antibody Screen: NEGATIVE
Unit division: 0
Unit division: 0

## 2014-05-04 MED ORDER — SODIUM CHLORIDE 0.9 % IV SOLN
100.0000 mg/m2 | Freq: Once | INTRAVENOUS | Status: AC
  Administered 2014-05-04: 180 mg via INTRAVENOUS
  Filled 2014-05-04: qty 9

## 2014-05-04 MED ORDER — PROCHLORPERAZINE MALEATE 10 MG PO TABS
ORAL_TABLET | ORAL | Status: AC
  Filled 2014-05-04: qty 1

## 2014-05-04 MED ORDER — SODIUM CHLORIDE 0.9 % IJ SOLN
10.0000 mL | INTRAMUSCULAR | Status: DC | PRN
  Administered 2014-05-04: 10 mL
  Filled 2014-05-04: qty 10

## 2014-05-04 MED ORDER — PROCHLORPERAZINE MALEATE 10 MG PO TABS
10.0000 mg | ORAL_TABLET | Freq: Once | ORAL | Status: AC
  Administered 2014-05-04: 10 mg via ORAL

## 2014-05-04 MED ORDER — SODIUM CHLORIDE 0.9 % IV SOLN
Freq: Once | INTRAVENOUS | Status: AC
  Administered 2014-05-04: 15:00:00 via INTRAVENOUS

## 2014-05-04 MED ORDER — HEPARIN SOD (PORK) LOCK FLUSH 100 UNIT/ML IV SOLN
500.0000 [IU] | Freq: Once | INTRAVENOUS | Status: AC | PRN
  Administered 2014-05-04: 500 [IU]
  Filled 2014-05-04: qty 5

## 2014-05-04 NOTE — Progress Notes (Signed)
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  She is a patient of Dr. Julien Nordmann.  PMH includes GERD, IBS, hiatal hernia, diverticulosis, HLD, HTN, anxiety, migranes, osteoporosis, Vitamin D deficiency, spinal stenosis, C-diff +.  Medications include Xanax, lipitor, wellbutrin, ativan.  Labs include Na 135, K 2.9, Alb 3.3.  Height:  65 inches. Weight: 131.4 pounds. UBW: 165 pounds. BMI: 21.87.  Patient reports chronic nausea but only takes nausea medication 1-2 times daily. She has a poor appetite and early satiety. Endorses weight loss of about 35 pounds.  Nutrition Diagnosis:  Inadequate oral intake related to poor appetite and early satiety as evidenced by 20% weight loss from UBW.  Intervention:   Educated patient to increase calories and protein in small, frequent meals and snacks. Recommended patient take nausea medication as prescribed. Recommended oral nutrition supplements, 4 ounces, TID between meals. Provided fact sheets and coupons. Questions answered. Teach back method used. Contact information provided.  Monitoring, evaluation, goals:  Patient will increase calories and protein to promote weight gain.  Next Visit:Thursday, January 7, during chemotherapy.  **Disclaimer: This note was dictated with voice recognition software. Similar sounding words can inadvertently be transcribed and this note may contain transcription errors which may not have been corrected upon publication of note.**

## 2014-05-04 NOTE — Patient Instructions (Signed)
[facility] Discharge Instructions for Patients Receiving Chemotherapy  Today you received the following chemotherapy agents:  Etoposide.  To help prevent nausea and vomiting after your treatment, we encourage you to take your nausea medication: Compazine 10 mg every 6 hours as needed.   If you develop nausea and vomiting that is not controlled by your nausea medication, call the clinic.   BELOW ARE SYMPTOMS THAT SHOULD BE REPORTED IMMEDIATELY:  *FEVER GREATER THAN 100.5 F  *CHILLS WITH OR WITHOUT FEVER  NAUSEA AND VOMITING THAT IS NOT CONTROLLED WITH YOUR NAUSEA MEDICATION  *UNUSUAL SHORTNESS OF BREATH  *UNUSUAL BRUISING OR BLEEDING  TENDERNESS IN MOUTH AND THROAT WITH OR WITHOUT PRESENCE OF ULCERS  *URINARY PROBLEMS  *BOWEL PROBLEMS  UNUSUAL RASH Items with * indicate a potential emergency and should be followed up as soon as possible.  Feel free to call the clinic you have any questions or concerns. The clinic phone number is [phone].   Blood Transfusion Information WHAT IS A BLOOD TRANSFUSION? A transfusion is the replacement of blood or some of its parts. Blood is made up of multiple cells which provide different functions.  Red blood cells carry oxygen and are used for blood loss replacement.  White blood cells fight against infection.  Platelets control bleeding.  Plasma helps clot blood.  Other blood products are available for specialized needs, such as hemophilia or other clotting disorders. BEFORE THE TRANSFUSION  Who gives blood for transfusions?   You may be able to donate blood to be used at a later date on yourself (autologous donation).  Relatives can be asked to donate blood. This is generally not any safer than if you have received blood from a stranger. The same precautions are taken to ensure safety when a relative's blood is donated.  Healthy volunteers who are fully evaluated to make sure their blood is safe. This is blood  bank blood. Transfusion therapy is the safest it has ever been in the practice of medicine. Before blood is taken from a donor, a complete history is taken to make sure that person has no history of diseases nor engages in risky social behavior (examples are intravenous drug use or sexual activity with multiple partners). The donor's travel history is screened to minimize risk of transmitting infections, such as malaria. The donated blood is tested for signs of infectious diseases, such as HIV and hepatitis. The blood is then tested to be sure it is compatible with you in order to minimize the chance of a transfusion reaction. If you or a relative donates blood, this is often done in anticipation of surgery and is not appropriate for emergency situations. It takes many days to process the donated blood. RISKS AND COMPLICATIONS Although transfusion therapy is very safe and saves many lives, the main dangers of transfusion include:   Getting an infectious disease.  Developing a transfusion reaction. This is an allergic reaction to something in the blood you were given. Every precaution is taken to prevent this. The decision to have a blood transfusion has been considered carefully by your caregiver before blood is given. Blood is not given unless the benefits outweigh the risks. AFTER THE TRANSFUSION  Right after receiving a blood transfusion, you will usually feel much better and more energetic. This is especially true if your red blood cells have gotten low (anemic). The transfusion raises the level of the red blood cells which carry oxygen, and this usually causes an energy increase.  The nurse  administering the transfusion will monitor you carefully for complications. HOME CARE INSTRUCTIONS  No special instructions are needed after a transfusion. You may find your energy is better. Speak with your caregiver about any limitations on activity for underlying diseases you may have. SEEK MEDICAL CARE  IF:   Your condition is not improving after your transfusion.  You develop redness or irritation at the intravenous (IV) site. SEEK IMMEDIATE MEDICAL CARE IF:  Any of the following symptoms occur over the next 12 hours:  Shaking chills.  You have a temperature by mouth above 102 F (38.9 C), not controlled by medicine.  Chest, back, or muscle pain.  People around you feel you are not acting correctly or are confused.  Shortness of breath or difficulty breathing.  Dizziness and fainting.  You get a rash or develop hives.  You have a decrease in urine output.  Your urine turns a dark color or changes to pink, red, or brown. Any of the following symptoms occur over the next 10 days:  You have a temperature by mouth above 102 F (38.9 C), not controlled by medicine.  Shortness of breath.  Weakness after normal activity.  The white part of the eye turns yellow (jaundice).  You have a decrease in the amount of urine or are urinating less often.  Your urine turns a dark color or changes to pink, red, or brown. Document Released: 05/02/2000 Document Revised: 07/28/2011 Document Reviewed: 12/20/2007 [facility] Patient Information 2015 [place], [place]. This information is not intended to replace advice given to you by your health care provider. Make sure you discuss any questions you have with your health care provider.

## 2014-05-05 ENCOUNTER — Ambulatory Visit (HOSPITAL_BASED_OUTPATIENT_CLINIC_OR_DEPARTMENT_OTHER): Payer: Medicare Other

## 2014-05-05 DIAGNOSIS — C3491 Malignant neoplasm of unspecified part of right bronchus or lung: Secondary | ICD-10-CM

## 2014-05-05 DIAGNOSIS — C342 Malignant neoplasm of middle lobe, bronchus or lung: Secondary | ICD-10-CM

## 2014-05-05 DIAGNOSIS — Z5189 Encounter for other specified aftercare: Secondary | ICD-10-CM

## 2014-05-05 MED ORDER — PEGFILGRASTIM INJECTION 6 MG/0.6ML ~~LOC~~
6.0000 mg | PREFILLED_SYRINGE | Freq: Once | SUBCUTANEOUS | Status: AC
  Administered 2014-05-05: 6 mg via SUBCUTANEOUS
  Filled 2014-05-05: qty 0.6

## 2014-05-05 NOTE — Patient Instructions (Signed)
Pegfilgrastim injection What is this medicine? PEGFILGRASTIM (peg fil GRA stim) is a long-acting granulocyte colony-stimulating factor that stimulates the growth of neutrophils, a type of white blood cell important in the body's fight against infection. It is used to reduce the incidence of fever and infection in patients with certain types of cancer who are receiving chemotherapy that affects the bone marrow. This medicine may be used for other purposes; ask your health care provider or pharmacist if you have questions. COMMON BRAND NAME(S): Neulasta What should I tell my health care provider before I take this medicine? They need to know if you have any of these conditions: -latex allergy -ongoing radiation therapy -sickle cell disease -skin reactions to acrylic adhesives (On-Body Injector only) -an unusual or allergic reaction to pegfilgrastim, filgrastim, other medicines, foods, dyes, or preservatives -pregnant or trying to get pregnant -breast-feeding How should I use this medicine? This medicine is for injection under the skin. If you get this medicine at home, you will be taught how to prepare and give the pre-filled syringe or how to use the On-body Injector. Refer to the patient Instructions for Use for detailed instructions. Use exactly as directed. Take your medicine at regular intervals. Do not take your medicine more often than directed. It is important that you put your used needles and syringes in a special sharps container. Do not put them in a trash can. If you do not have a sharps container, call your pharmacist or healthcare provider to get one. Talk to your pediatrician regarding the use of this medicine in children. Special care may be needed. Overdosage: If you think you have taken too much of this medicine contact a poison control center or emergency room at once. NOTE: This medicine is only for you. Do not share this medicine with others. What if I miss a dose? It is  important not to miss your dose. Call your doctor or health care professional if you miss your dose. If you miss a dose due to an On-body Injector failure or leakage, a new dose should be administered as soon as possible using a single prefilled syringe for manual use. What may interact with this medicine? Interactions have not been studied. Give your health care provider a list of all the medicines, herbs, non-prescription drugs, or dietary supplements you use. Also tell them if you smoke, drink alcohol, or use illegal drugs. Some items may interact with your medicine. This list may not describe all possible interactions. Give your health care provider a list of all the medicines, herbs, non-prescription drugs, or dietary supplements you use. Also tell them if you smoke, drink alcohol, or use illegal drugs. Some items may interact with your medicine. What should I watch for while using this medicine? You may need blood work done while you are taking this medicine. If you are going to need a MRI, CT scan, or other procedure, tell your doctor that you are using this medicine (On-Body Injector only). What side effects may I notice from receiving this medicine? Side effects that you should report to your doctor or health care professional as soon as possible: -allergic reactions like skin rash, itching or hives, swelling of the face, lips, or tongue -dizziness -fever -pain, redness, or irritation at site where injected -pinpoint red spots on the skin -shortness of breath or breathing problems -stomach or side pain, or pain at the shoulder -swelling -tiredness -trouble passing urine Side effects that usually do not require medical attention (report to your doctor   or health care professional if they continue or are bothersome): -bone pain -muscle pain This list may not describe all possible side effects. Call your doctor for medical advice about side effects. You may report side effects to FDA at  1-800-FDA-1088. Where should I keep my medicine? Keep out of the reach of children. Store pre-filled syringes in a refrigerator between 2 and 8 degrees C (36 and 46 degrees F). Do not freeze. Keep in carton to protect from light. Throw away this medicine if it is left out of the refrigerator for more than 48 hours. Throw away any unused medicine after the expiration date. NOTE: This sheet is a summary. It may not cover all possible information. If you have questions about this medicine, talk to your doctor, pharmacist, or health care provider.  2015, Elsevier/Gold Standard. (2013-08-04 16:14:05)  You are having some constipation.  We recommend Miralax and if that is not helpful go ahead and use Magnesium Citrate. Take 1/2 bottle and then if no resutls after 3 hours take the rest of the bottle.  Go ahead and take some stool softner also - like colace.  It is important to drink plenty of fluids with your treatment.  Your pain medicine may be contributing to your constipation, therefore you may have to take Miralax on a regular basis.

## 2014-05-09 ENCOUNTER — Telehealth: Payer: Self-pay | Admitting: *Deleted

## 2014-05-09 ENCOUNTER — Other Ambulatory Visit: Payer: Self-pay | Admitting: *Deleted

## 2014-05-09 ENCOUNTER — Ambulatory Visit (HOSPITAL_BASED_OUTPATIENT_CLINIC_OR_DEPARTMENT_OTHER): Payer: Medicare Other | Admitting: Lab

## 2014-05-09 ENCOUNTER — Ambulatory Visit (HOSPITAL_BASED_OUTPATIENT_CLINIC_OR_DEPARTMENT_OTHER): Payer: Medicare Other

## 2014-05-09 DIAGNOSIS — Z452 Encounter for adjustment and management of vascular access device: Secondary | ICD-10-CM

## 2014-05-09 DIAGNOSIS — C342 Malignant neoplasm of middle lobe, bronchus or lung: Secondary | ICD-10-CM

## 2014-05-09 DIAGNOSIS — C3490 Malignant neoplasm of unspecified part of unspecified bronchus or lung: Secondary | ICD-10-CM

## 2014-05-09 DIAGNOSIS — C3491 Malignant neoplasm of unspecified part of right bronchus or lung: Secondary | ICD-10-CM

## 2014-05-09 DIAGNOSIS — Z95828 Presence of other vascular implants and grafts: Secondary | ICD-10-CM

## 2014-05-09 LAB — COMPREHENSIVE METABOLIC PANEL (CC13)
ALK PHOS: 271 U/L — AB (ref 40–150)
ALT: 11 U/L (ref 0–55)
ANION GAP: 13 meq/L — AB (ref 3–11)
AST: 15 U/L (ref 5–34)
Albumin: 3.3 g/dL — ABNORMAL LOW (ref 3.5–5.0)
BUN: 23.4 mg/dL (ref 7.0–26.0)
CO2: 34 meq/L — AB (ref 22–29)
CREATININE: 0.7 mg/dL (ref 0.6–1.1)
Calcium: 7.4 mg/dL — ABNORMAL LOW (ref 8.4–10.4)
Chloride: 91 mEq/L — ABNORMAL LOW (ref 98–109)
EGFR: 81 mL/min/{1.73_m2} — AB (ref >90)
GLUCOSE: 103 mg/dL (ref 70–140)
Potassium: 3 mEq/L — CL (ref 3.5–5.1)
Sodium: 137 mEq/L (ref 136–145)
TOTAL PROTEIN: 6.5 g/dL (ref 6.4–8.3)
Total Bilirubin: 1.84 mg/dL — ABNORMAL HIGH (ref 0.20–1.20)

## 2014-05-09 LAB — CBC WITH DIFFERENTIAL/PLATELET
BASO%: 3.5 % — ABNORMAL HIGH (ref 0.0–2.0)
Basophils Absolute: 0 10*3/uL (ref 0.0–0.1)
EOS%: 0 % (ref 0.0–7.0)
Eosinophils Absolute: 0 10*3/uL (ref 0.0–0.5)
HCT: 24.2 % — ABNORMAL LOW (ref 34.8–46.6)
HGB: 8.1 g/dL — ABNORMAL LOW (ref 11.6–15.9)
LYMPH#: 0.4 10*3/uL — AB (ref 0.9–3.3)
LYMPH%: 73.7 % — ABNORMAL HIGH (ref 14.0–49.7)
MCH: 28.1 pg (ref 25.1–34.0)
MCHC: 33.5 g/dL (ref 31.5–36.0)
MCV: 84 fL (ref 79.5–101.0)
MONO#: 0 10*3/uL — ABNORMAL LOW (ref 0.1–0.9)
MONO%: 3.5 % (ref 0.0–14.0)
NEUT#: 0.1 10*3/uL — CL (ref 1.5–6.5)
NEUT%: 19.3 % — ABNORMAL LOW (ref 38.4–76.8)
Platelets: 89 10*3/uL — ABNORMAL LOW (ref 145–400)
RBC: 2.88 10*6/uL — AB (ref 3.70–5.45)
RDW: 16.9 % — AB (ref 11.2–14.5)
WBC: 0.6 10*3/uL — AB (ref 3.9–10.3)
nRBC: 0 % (ref 0–0)

## 2014-05-09 LAB — HOLD TUBE, BLOOD BANK

## 2014-05-09 MED ORDER — HEPARIN SOD (PORK) LOCK FLUSH 100 UNIT/ML IV SOLN
500.0000 [IU] | Freq: Once | INTRAVENOUS | Status: AC
  Administered 2014-05-09: 500 [IU] via INTRAVENOUS
  Filled 2014-05-09: qty 5

## 2014-05-09 MED ORDER — TBO-FILGRASTIM 300 MCG/0.5ML ~~LOC~~ SOSY: 300.0000 ug | PREFILLED_SYRINGE | Freq: Once | SUBCUTANEOUS | Status: DC

## 2014-05-09 MED ORDER — SODIUM CHLORIDE 0.9 % IJ SOLN
10.0000 mL | INTRAMUSCULAR | Status: DC | PRN
  Administered 2014-05-09: 10 mL via INTRAVENOUS
  Filled 2014-05-09: qty 10

## 2014-05-09 NOTE — Patient Instructions (Signed)

## 2014-05-09 NOTE — Telephone Encounter (Signed)
Attempted to call again.  No answer.  Will continue to try to contact pt.

## 2014-05-09 NOTE — Telephone Encounter (Signed)
WBC 0.6, ABC 0.1.  K 3.0, Per Dr Vista Mink pt needs 2 days of granix and oral K to take at home.  Attempted to call patient.  Left vm's on home #, cell phone # and family ph# for a call back.

## 2014-05-09 NOTE — Telephone Encounter (Signed)
Pt's husband called back, left a msg, attempted to call him back, no answer.  Left another message to call us.

## 2014-05-09 NOTE — Telephone Encounter (Signed)
Called back and spoke to pt.  Made injection appts for 12/23 and 12/24.  Informed pt to take K dur 25meq x 7 days then resume regular rx of 38meq daily.  Neutropenic pxns given to patient.

## 2014-05-10 ENCOUNTER — Encounter: Payer: Self-pay | Admitting: Nurse Practitioner

## 2014-05-10 ENCOUNTER — Ambulatory Visit (HOSPITAL_BASED_OUTPATIENT_CLINIC_OR_DEPARTMENT_OTHER): Payer: Medicare Other | Admitting: Nurse Practitioner

## 2014-05-10 ENCOUNTER — Ambulatory Visit (HOSPITAL_BASED_OUTPATIENT_CLINIC_OR_DEPARTMENT_OTHER): Payer: Medicare Other

## 2014-05-10 ENCOUNTER — Telehealth: Payer: Self-pay | Admitting: Nurse Practitioner

## 2014-05-10 ENCOUNTER — Other Ambulatory Visit: Payer: Self-pay | Admitting: Medical Oncology

## 2014-05-10 DIAGNOSIS — D61810 Antineoplastic chemotherapy induced pancytopenia: Secondary | ICD-10-CM

## 2014-05-10 DIAGNOSIS — M545 Low back pain: Secondary | ICD-10-CM

## 2014-05-10 DIAGNOSIS — R112 Nausea with vomiting, unspecified: Secondary | ICD-10-CM

## 2014-05-10 DIAGNOSIS — C3490 Malignant neoplasm of unspecified part of unspecified bronchus or lung: Secondary | ICD-10-CM

## 2014-05-10 DIAGNOSIS — E860 Dehydration: Secondary | ICD-10-CM

## 2014-05-10 DIAGNOSIS — C7951 Secondary malignant neoplasm of bone: Secondary | ICD-10-CM

## 2014-05-10 DIAGNOSIS — R251 Tremor, unspecified: Secondary | ICD-10-CM

## 2014-05-10 DIAGNOSIS — Z5189 Encounter for other specified aftercare: Secondary | ICD-10-CM

## 2014-05-10 DIAGNOSIS — D61818 Other pancytopenia: Secondary | ICD-10-CM

## 2014-05-10 DIAGNOSIS — E876 Hypokalemia: Secondary | ICD-10-CM

## 2014-05-10 MED ORDER — SODIUM CHLORIDE 0.9 % IV SOLN
INTRAVENOUS | Status: AC
  Administered 2014-05-10: 15:00:00 via INTRAVENOUS

## 2014-05-10 MED ORDER — MORPHINE SULFATE 4 MG/ML IJ SOLN
2.0000 mg | Freq: Once | INTRAMUSCULAR | Status: AC
  Administered 2014-05-10: 2 mg via INTRAVENOUS

## 2014-05-10 MED ORDER — HEPARIN SOD (PORK) LOCK FLUSH 100 UNIT/ML IV SOLN
500.0000 [IU] | Freq: Once | INTRAVENOUS | Status: AC
  Administered 2014-05-10: 500 [IU] via INTRAVENOUS
  Filled 2014-05-10: qty 5

## 2014-05-10 MED ORDER — ONDANSETRON 8 MG/NS 50 ML IVPB
INTRAVENOUS | Status: AC
  Filled 2014-05-10: qty 8

## 2014-05-10 MED ORDER — MORPHINE SULFATE 4 MG/ML IJ SOLN
INTRAMUSCULAR | Status: AC
Start: 2014-05-10 — End: 2014-05-10
  Filled 2014-05-10: qty 1

## 2014-05-10 MED ORDER — ONDANSETRON 8 MG/50ML IVPB (CHCC)
8.0000 mg | Freq: Once | INTRAVENOUS | Status: AC
  Administered 2014-05-10: 8 mg via INTRAVENOUS

## 2014-05-10 MED ORDER — TBO-FILGRASTIM 300 MCG/0.5ML ~~LOC~~ SOSY
300.0000 ug | PREFILLED_SYRINGE | Freq: Once | SUBCUTANEOUS | Status: AC
  Administered 2014-05-10: 300 ug via SUBCUTANEOUS
  Filled 2014-05-10: qty 0.5

## 2014-05-10 MED ORDER — SODIUM CHLORIDE 0.9 % IJ SOLN
10.0000 mL | INTRAMUSCULAR | Status: DC | PRN
  Administered 2014-05-10: 10 mL via INTRAVENOUS
  Filled 2014-05-10: qty 10

## 2014-05-10 NOTE — Progress Notes (Signed)
Pt presented to injection room and received granix. She started kdur 40 meq bid . Pt's heart rate is up 125/min. She states she is eating and drinking, but she states she does not feel good. Appt made to se Cyndee bacon.

## 2014-05-10 NOTE — Assessment & Plan Note (Signed)
Chemotherapy therapy-induced pancytopenia with ANC 0.1, hemoglobin 8.1, platelet count 89.  Briefly reviewed all neutropenia guidelines with the patient and her husband again today.  Patient will receive a Granix injection both today and tomorrow for growth factor support.  Patient is complaining of some increased fatigue and weakness; but denies any shortness of breath.  1 need to keep a close watch on patient's hemoglobin cycle and occasionally needs another transfusion.  Platelet count is 89.  Patient denies any worsening issues with either easy bleeding or bruising today.

## 2014-05-10 NOTE — Patient Instructions (Signed)
Dehydration, Adult Dehydration is when you lose more fluids from the body than you take in. Vital organs like the kidneys, brain, and heart cannot function without a proper amount of fluids and salt. Any loss of fluids from the body can cause dehydration.  CAUSES   Vomiting.  Diarrhea.  Excessive sweating.  Excessive urine output.  Fever. SYMPTOMS  Mild dehydration  Thirst.  Dry lips.  Slightly dry mouth. Moderate dehydration  Very dry mouth.  Sunken eyes.  Skin does not bounce back quickly when lightly pinched and released.  Dark urine and decreased urine production.  Decreased tear production.  Headache. Severe dehydration  Very dry mouth.  Extreme thirst.  Rapid, weak pulse (more than 100 beats per minute at rest).  Cold hands and feet.  Not able to sweat in spite of heat and temperature.  Rapid breathing.  Blue lips.  Confusion and lethargy.  Difficulty being awakened.  Minimal urine production.  No tears. DIAGNOSIS  Your caregiver will diagnose dehydration based on your symptoms and your exam. Blood and urine tests will help confirm the diagnosis. The diagnostic evaluation should also identify the cause of dehydration. TREATMENT  Treatment of mild or moderate dehydration can often be done at home by increasing the amount of fluids that you drink. It is best to drink small amounts of fluid more often. Drinking too much at one time can make vomiting worse. Refer to the home care instructions below. Severe dehydration needs to be treated at the hospital where you will probably be given intravenous (IV) fluids that contain water and electrolytes. HOME CARE INSTRUCTIONS   Ask your caregiver about specific rehydration instructions.  Drink enough fluids to keep your urine clear or pale yellow.  Drink small amounts frequently if you have nausea and vomiting.  Eat as you normally do.  Avoid:  Foods or drinks high in sugar.  Carbonated  drinks.  Juice.  Extremely hot or cold fluids.  Drinks with caffeine.  Fatty, greasy foods.  Alcohol.  Tobacco.  Overeating.  Gelatin desserts.  Wash your hands well to avoid spreading bacteria and viruses.  Only take over-the-counter or prescription medicines for pain, discomfort, or fever as directed by your caregiver.  Ask your caregiver if you should continue all prescribed and over-the-counter medicines.  Keep all follow-up appointments with your caregiver. SEEK MEDICAL CARE IF:  You have abdominal pain and it increases or stays in one area (localizes).  You have a rash, stiff neck, or severe headache.  You are irritable, sleepy, or difficult to awaken.  You are weak, dizzy, or extremely thirsty. SEEK IMMEDIATE MEDICAL CARE IF:   You are unable to keep fluids down or you get worse despite treatment.  You have frequent episodes of vomiting or diarrhea.  You have blood or green matter (bile) in your vomit.  You have blood in your stool or your stool looks black and tarry.  You have not urinated in 6 to 8 hours, or you have only urinated a small amount of very dark urine.  You have a fever.  You faint. MAKE SURE YOU:   Understand these instructions.  Will watch your condition.  Will get help right away if you are not doing well or get worse. Document Released: 05/05/2005 Document Revised: 07/28/2011 Document Reviewed: 12/23/2010 [employer] Patient Information 2015 [employer]. This information is not intended to replace advice given to you by your health care provider. Make sure you discuss any questions you have with your health care   provider.  

## 2014-05-10 NOTE — Assessment & Plan Note (Signed)
Patient has developed a generalized tremor since initiating chemotherapy.  She reports that she has an appointment for neurological consult with Dr. Carles Collet for  this coming Tuesday, 05/16/2014.  Patient reports that she has tried Ativan; but has been  ineffective in managing her tremors.

## 2014-05-10 NOTE — Assessment & Plan Note (Signed)
Calcium level has actually improved from 6.4 up to 7.4.  Will continue to monitor closely.

## 2014-05-10 NOTE — Patient Instructions (Signed)

## 2014-05-10 NOTE — Progress Notes (Signed)
will   SYMPTOM MANAGEMENT CLINIC   HPI: [name] 73 y.o. female diagnosed with lung cancer; with bone metastasis.  Currently undergoing carboplatin/etoposide/Xgeva chemotherapy regimen.   Patient called the cancer Center today requesting urgent care visit.  She is complaining of some chronic nausea; and is some occasional vomiting.  She feels dehydrated today; is requesting IV fluid rehydration.  She continues to complain of some chronic pain to her lower back.  She also continues with generalized tremors.  She states she has tried Ativan for the tremor; but has not helped.  She has an appointment for neurological evaluation per Dr. Carles Collet for this coming Tuesday, 05/16/2014.  Patient denies any constipation or diarrhea.  She denies any fever or chills.  HPI  CURRENT THERAPY: Upcoming Treatment Dates - LUNG SMALL CELL Carboplatin D1 / Etoposide D1-3 q21d Days with orders from any treatment category:  05/24/2014      SCHEDULING COMMUNICATION      prochlorperazine (COMPAZINE) tablet 10 mg      etoposide (VEPESID) 180 mg in sodium chloride 0.9 % 500 mL chemo infusion      sodium chloride 0.9 % injection 10 mL      heparin lock flush 100 unit/mL      heparin lock flush 100 unit/mL      alteplase (CATHFLO ACTIVASE) injection 2 mg      sodium chloride 0.9 % injection 3 mL      Hot Pack 1 packet      0.9 %  sodium chloride infusion 05/25/2014      SCHEDULING COMMUNICATION      prochlorperazine (COMPAZINE) tablet 10 mg      etoposide (VEPESID) 180 mg in sodium chloride 0.9 % 500 mL chemo infusion      sodium chloride 0.9 % injection 10 mL      heparin lock flush 100 unit/mL      heparin lock flush 100 unit/mL      alteplase (CATHFLO ACTIVASE) injection 2 mg      sodium chloride 0.9 % injection 3 mL      Hot Pack 1 packet      0.9 %  sodium chloride infusion 05/26/2014      SCHEDULING COMMUNICATION INJECTION      pegfilgrastim (NEULASTA) injection 6 mg    ROS  Past Medical History    Diagnosis Date  . GERD (gastroesophageal reflux disease)   . IBS (irritable bowel syndrome)   . Adenomatous colon polyp 06/2009  . Hiatal hernia   . Breast cyst   . Diverticulosis   . Hemorrhoids   . Hyperlipidemia   . Hypertension   . Hyperparathyroidism   . Anxiety   . Migraine   . Osteoporosis   . Vitamin D deficiency   . Spinal stenosis   . C. difficile colitis 2006  . Small cell lung carcinoma dx'd 02/2014  . Bone metastases dx'd 02/2014    Past Surgical History  Procedure Laterality Date  . Cholecystectomy    . Vaginal hysterectomy    . Knee arthroscopy      left  . Parathyroidectomy  2009    [facility]  . Lumbar spine surgery  2001  . Breast biopsy      bilateral  . Total knee arthroplasty      right  . Bladder repair    . Video bronchoscopy Bilateral 03/14/2014    Procedure: VIDEO BRONCHOSCOPY WITHOUT FLUORO;  Surgeon: Tanda Rockers, MD;  Location: [facility];  Service: Cardiopulmonary;  Laterality: Bilateral;    has GERD; IRRITABLE BOWEL SYNDROME; CHEST PAIN; ABDOMINAL PAIN-EPIGASTRIC; NONSPEC ELEVATION OF LEVELS OF TRANSAMINASE/LDH; PERSONAL HX COLONIC POLYPS; Lung mass; Acute mid back pain secondary to lytic lesion T8  03/02/14; Small cell carcinoma of lung; Nausea with vomiting; Dehydration; Diarrhea; Hypokalemia; Thrombocytopenia; Neoplasm related pain; Tremor; Bone metastasis; Hyperbilirubinemia; Hyperphosphatemia; Anemia in neoplastic disease; Hypocalcemia; and Other pancytopenia on her problem list.     is allergic to ace inhibitors; cephalexin; etodolac; and gabapentin.    Medication List       This list is accurate as of: 05/10/14  3:57 PM.  Always use your most recent med list.               ALPRAZolam 0.5 MG tablet  Commonly known as:  XANAX  Take 0.5 mg by mouth at bedtime.     atorvastatin 80 MG tablet  Commonly known as:  LIPITOR  Take 80 mg by mouth every morning.     buPROPion 150 MG 12 hr tablet  Commonly known as:   WELLBUTRIN SR  Take 150 mg by mouth every morning.     dexlansoprazole 60 MG capsule  Commonly known as:  DEXILANT  TAKE 1 CAPSULE (60 MG TOTAL) BY MOUTH DAILY.     glycopyrrolate 2 MG tablet  Commonly known as:  ROBINUL  TAKE 1 TABLET (2 MG TOTAL) BY MOUTH 2 (TWO) TIMES DAILY.     hydrochlorothiazide 12.5 MG tablet  Commonly known as:  HYDRODIURIL  Take 1 tablet by mouth every morning.     HYDROMET 5-1.5 MG/5ML syrup  Generic drug:  HYDROcodone-homatropine     lidocaine-prilocaine cream  Commonly known as:  EMLA  Apply 1 application topically as needed. Apply to port 1 hr before chemo     LORazepam 0.5 MG tablet  Commonly known as:  ATIVAN  Take 1 tablet (0.5 mg total) by mouth every 8 (eight) hours.     metoprolol succinate 50 MG 24 hr tablet  Commonly known as:  TOPROL-XL  Take 50 mg by mouth every morning.     MIRALAX PO  Take 1 scoop by mouth daily as needed (for constipation).     oxycodone 5 MG capsule  Commonly known as:  OXY-IR  1-2 every 4 hours as needed for back pain     potassium chloride SA 20 MEQ tablet  Commonly known as:  K-DUR,KLOR-CON  Take 1 tablet (20 mEq total) by mouth daily.     pregabalin 50 MG capsule  Commonly known as:  LYRICA  Take 50 mg by mouth 2 (two) times daily.     prochlorperazine 10 MG tablet  Commonly known as:  COMPAZINE  Take 1 tablet (10 mg total) by mouth every 6 (six) hours as needed for nausea or vomiting.     traMADol 50 MG tablet  Commonly known as:  ULTRAM  Take 50 mg by mouth 3 (three) times daily as needed for moderate pain.     ZORVOLEX 35 MG Caps  Generic drug:  Diclofenac  Take 35 mg by mouth every morning.         PHYSICAL EXAMINATION  Vitals: BP 10/76, HR 125, temp 98.0.  HR on recheck was 114.    Physical Exam  Constitutional: She is oriented to person, place, and time. She appears dehydrated. She appears unhealthy. She appears cachectic.  HENT:  Head: Normocephalic and atraumatic.    Mouth/Throat: Oropharynx is clear and moist.  Eyes: Conjunctivae and  EOM are normal. Pupils are equal, round, and reactive to light. Right eye exhibits no discharge. Left eye exhibits no discharge. No scleral icterus.  Neck: Normal range of motion. Neck supple. No JVD present. No tracheal deviation present. No thyromegaly present.  Cardiovascular: Regular rhythm, normal heart sounds and intact distal pulses.   Tachycardic rate.  Pulmonary/Chest: Effort normal and breath sounds normal. No stridor. No respiratory distress. She has no wheezes. She has no rales. She exhibits no tenderness.  Abdominal: Soft. Bowel sounds are normal. She exhibits no distension and no mass. There is no tenderness. There is no rebound and no guarding.  Musculoskeletal: Normal range of motion. She exhibits tenderness. She exhibits no edema.  Tenderness to low back and pelvic region with movement; but no tenderness with palpation.  This is patient's baseline.  Lymphadenopathy:    She has no cervical adenopathy.  Neurological: She is alert and oriented to person, place, and time.  Skin: Skin is warm and dry. No rash noted. No erythema. There is pallor.  Psychiatric: Affect normal.  Nursing note and vitals reviewed.   LABORATORY DATA:. Clinical Support on 05/09/2014  Component Date Value Ref Range Status  . WBC 05/09/2014 0.6* 3.9 - 10.3 10e3/uL Final  . NEUT# 05/09/2014 0.1* 1.5 - 6.5 10e3/uL Final  . HGB 05/09/2014 8.1* 11.6 - 15.9 g/dL Final  . HCT 05/09/2014 24.2* 34.8 - 46.6 % Final  . Platelets 05/09/2014 89* 145 - 400 10e3/uL Final  . MCV 05/09/2014 84.0  79.5 - 101.0 fL Final  . MCH 05/09/2014 28.1  25.1 - 34.0 pg Final  . MCHC 05/09/2014 33.5  31.5 - 36.0 g/dL Final  . RBC 05/09/2014 2.88* 3.70 - 5.45 10e6/uL Final  . RDW 05/09/2014 16.9* 11.2 - 14.5 % Final  . lymph# 05/09/2014 0.4* 0.9 - 3.3 10e3/uL Final  . MONO# 05/09/2014 0.0* 0.1 - 0.9 10e3/uL Final  . Eosinophils Absolute 05/09/2014 0.0  0.0 -  0.5 10e3/uL Final  . Basophils Absolute 05/09/2014 0.0  0.0 - 0.1 10e3/uL Final  . NEUT% 05/09/2014 19.3* 38.4 - 76.8 % Final  . LYMPH% 05/09/2014 73.7* 14.0 - 49.7 % Final  . MONO% 05/09/2014 3.5  0.0 - 14.0 % Final  . EOS% 05/09/2014 0.0  0.0 - 7.0 % Final  . BASO% 05/09/2014 3.5* 0.0 - 2.0 % Final  . nRBC 05/09/2014 0  0 - 0 % Final  . Sodium 05/09/2014 137  136 - 145 mEq/L Final  . Potassium 05/09/2014 3.0* 3.5 - 5.1 mEq/L Final  . Chloride 05/09/2014 91* 98 - 109 mEq/L Final  . CO2 05/09/2014 34* 22 - 29 mEq/L Final  . Glucose 05/09/2014 103  70 - 140 mg/dl Final  . BUN 05/09/2014 23.4  7.0 - 26.0 mg/dL Final  . Creatinine 05/09/2014 0.7  0.6 - 1.1 mg/dL Final  . Total Bilirubin 05/09/2014 1.84* 0.20 - 1.20 mg/dL Final  . Alkaline Phosphatase 05/09/2014 271* 40 - 150 U/L Final  . AST 05/09/2014 15  5 - 34 U/L Final  . ALT 05/09/2014 11  0 - 55 U/L Final  . Total Protein 05/09/2014 6.5  6.4 - 8.3 g/dL Final  . Albumin 05/09/2014 3.3* 3.5 - 5.0 g/dL Final  . Calcium 05/09/2014 7.4* 8.4 - 10.4 mg/dL Final  . Anion Gap 05/09/2014 13* 3 - 11 mEq/L Final  . EGFR 05/09/2014 81* >90 ml/min/1.73 m2 Final   eGFR is calculated using the CKD-EPI Creatinine Equation (2009)  . Hold Tube, Blood Bank  05/09/2014 Blood Bank Order Cancelled   Final     RADIOGRAPHIC STUDIES: No results found.  ASSESSMENT/PLAN:    Bone metastasis Patient continues to take Xgeva on a monthly basis for her bone metastasis.  She last received Xgeva on 04/14/2014.  She will return on 05/23/2014 for her next Xgeva injection.  Patient was complaining of some mild back pain today; and was given morphine 2 mg IV while in the [place].  Dehydration Patient is complaining of some chronic nausea; and occasional vomiting.  She feels fairly dehydrated today.  Patient will receive 1 L normal saline IV fluid rehydration today.  Hyperbilirubinemia Bilirubin has increased to 1.84.  Previously been 1.01.  This could be  disease related hyperbilirubinemia.  Will continue to monitor closely.  Hyperphosphatemia Alkaline phosphatase has increased to 271 from previous 215.  This could very well be secondary to patient's lung cancer.  Will continue to monitor closely.  Hypocalcemia Calcium level has actually improved from 6.4 up to 7.4.  Will continue to monitor closely.  Hypokalemia Potassium from labs drawn yesterday was noted to be low at 3.0.  Patient was called in a prescription for potassium 40 mEq daily 1 week; and then decrease in the potassium to 20 mg once per day.  Nausea with vomiting Patient has been complaining of some chronic nausea; and occasional mild vomiting.  Patient does feel dehydrated today.  She states that she has been trying both the Zofran and Ativan with only minimal effectiveness today.  The patient was given Zofran IV while in the [place] today.  Other pancytopenia Chemotherapy therapy-induced pancytopenia with ANC 0.1, hemoglobin 8.1, platelet count 89.  Briefly reviewed all neutropenia guidelines with the patient and her husband again today.  Patient will receive a Granix injection both today and tomorrow for growth factor support.  Patient is complaining of some increased fatigue and weakness; but denies any shortness of breath.  1 need to keep a close watch on patient's hemoglobin cycle and occasionally needs another transfusion.  Platelet count is 89.  Patient denies any worsening issues with either easy bleeding or bruising today.  Small cell carcinoma of lung Patient received cycle 3 of her carboplatin/etoposide chemotherapy regimen on 05/02/2014.  She has plans to return for cycle 4 of the same regimen on 05/23/2014.  Tremor Patient has developed a generalized tremor since initiating chemotherapy.  She reports that she has an appointment for neurological consult with Dr. Carles Collet for  this coming Tuesday, 05/16/2014.  Patient reports that she has tried Ativan; but has been   ineffective in managing her tremors.  Patient stated understanding of all instructions; and was in agreement with this plan of care. The patient knows to call the clinic with any problems, questions or concerns.   Review/collaboration with Dr.Mohamed regarding all aspects of patient's visit today.   Total time spent with patient was 25 minutes;  with greater than 75 percent of that time spent in face to face counseling regarding her symptoms,  and coordination of care and follow up.  Disclaimer: This note was dictated with voice recognition software. Similar sounding words can inadvertently be transcribed and may not be corrected upon review.   Drue Second, NP 05/10/2014

## 2014-05-10 NOTE — Assessment & Plan Note (Signed)
Potassium from labs drawn yesterday was noted to be low at 3.0.  Patient was called in a prescription for potassium 40 mEq daily 1 week; and then decrease in the potassium to 20 mg once per day.

## 2014-05-10 NOTE — Assessment & Plan Note (Signed)
Patient is complaining of some chronic nausea; and occasional vomiting.  She feels fairly dehydrated today.  Patient will receive 1 L normal saline IV fluid rehydration today.

## 2014-05-10 NOTE — Telephone Encounter (Signed)
Pt was added to NP/CB schedule today per 12/23 POF, sent msg to [facility] that pt will need to be registered for this..... KJ

## 2014-05-10 NOTE — Assessment & Plan Note (Signed)
Bilirubin has increased to 1.84.  Previously been 1.01.  This could be disease related hyperbilirubinemia.  Will continue to monitor closely.

## 2014-05-10 NOTE — Assessment & Plan Note (Signed)
Alkaline phosphatase has increased to 271 from previous 215.  This could very well be secondary to patient's lung cancer.  Will continue to monitor closely.

## 2014-05-10 NOTE — Assessment & Plan Note (Signed)
Patient has been complaining of some chronic nausea; and occasional mild vomiting.  Patient does feel dehydrated today.  She states that she has been trying both the Zofran and Ativan with only minimal effectiveness today.  The patient was given Zofran IV while in the [place] today.

## 2014-05-10 NOTE — Assessment & Plan Note (Signed)
Patient continues to take Xgeva on a monthly basis for her bone metastasis.  She last received Xgeva on 04/14/2014.  She will return on 05/23/2014 for her next Xgeva injection.  Patient was complaining of some mild back pain today; and was given morphine 2 mg IV while in the [place].

## 2014-05-10 NOTE — Assessment & Plan Note (Signed)
Patient received cycle 3 of her carboplatin/etoposide chemotherapy regimen on 05/02/2014.  She has plans to return for cycle 4 of the same regimen on 05/23/2014.

## 2014-05-11 ENCOUNTER — Ambulatory Visit (HOSPITAL_BASED_OUTPATIENT_CLINIC_OR_DEPARTMENT_OTHER): Payer: Medicare Other

## 2014-05-11 ENCOUNTER — Other Ambulatory Visit: Payer: Self-pay | Admitting: Medical Oncology

## 2014-05-11 VITALS — BP 109/73 | HR 104 | Temp 98.9°F

## 2014-05-11 DIAGNOSIS — D61810 Antineoplastic chemotherapy induced pancytopenia: Secondary | ICD-10-CM

## 2014-05-11 DIAGNOSIS — C3490 Malignant neoplasm of unspecified part of unspecified bronchus or lung: Secondary | ICD-10-CM

## 2014-05-11 DIAGNOSIS — Z5189 Encounter for other specified aftercare: Secondary | ICD-10-CM

## 2014-05-11 MED ORDER — PROCHLORPERAZINE MALEATE 10 MG PO TABS: 10.0000 mg | ORAL_TABLET | Freq: Four times a day (QID) | ORAL | Status: DC | PRN

## 2014-05-11 MED ORDER — TBO-FILGRASTIM 300 MCG/0.5ML ~~LOC~~ SOSY
300.0000 ug | PREFILLED_SYRINGE | Freq: Once | SUBCUTANEOUS | Status: AC
  Administered 2014-05-11: 300 ug via SUBCUTANEOUS
  Filled 2014-05-11: qty 0.5

## 2014-05-11 NOTE — Patient Instructions (Signed)

## 2014-05-13 ENCOUNTER — Inpatient Hospital Stay (HOSPITAL_COMMUNITY)
Admission: EM | Admit: 2014-05-13 | Discharge: 2014-05-17 | DRG: 808 | Disposition: A | Discharge status: Home / Self Care | Payer: Medicare Other | Attending: Internal Medicine | Admitting: Internal Medicine

## 2014-05-13 ENCOUNTER — Encounter (HOSPITAL_COMMUNITY): Payer: Self-pay | Admitting: *Deleted

## 2014-05-13 DIAGNOSIS — Z96651 Presence of right artificial knee joint: Secondary | ICD-10-CM | POA: Diagnosis present

## 2014-05-13 DIAGNOSIS — Z6821 Body mass index (BMI) 21.0-21.9, adult: Secondary | ICD-10-CM | POA: Diagnosis not present

## 2014-05-13 DIAGNOSIS — Z87891 Personal history of nicotine dependence: Secondary | ICD-10-CM

## 2014-05-13 DIAGNOSIS — K589 Irritable bowel syndrome without diarrhea: Secondary | ICD-10-CM | POA: Diagnosis present

## 2014-05-13 DIAGNOSIS — Z888 Allergy status to other drugs, medicaments and biological substances status: Secondary | ICD-10-CM

## 2014-05-13 DIAGNOSIS — I10 Essential (primary) hypertension: Secondary | ICD-10-CM | POA: Diagnosis present

## 2014-05-13 DIAGNOSIS — E43 Unspecified severe protein-calorie malnutrition: Secondary | ICD-10-CM | POA: Diagnosis present

## 2014-05-13 DIAGNOSIS — E876 Hypokalemia: Secondary | ICD-10-CM | POA: Diagnosis present

## 2014-05-13 DIAGNOSIS — E785 Hyperlipidemia, unspecified: Secondary | ICD-10-CM | POA: Diagnosis present

## 2014-05-13 DIAGNOSIS — C3490 Malignant neoplasm of unspecified part of unspecified bronchus or lung: Secondary | ICD-10-CM | POA: Diagnosis present

## 2014-05-13 DIAGNOSIS — F419 Anxiety disorder, unspecified: Secondary | ICD-10-CM | POA: Diagnosis present

## 2014-05-13 DIAGNOSIS — Z90710 Acquired absence of both cervix and uterus: Secondary | ICD-10-CM

## 2014-05-13 DIAGNOSIS — C7951 Secondary malignant neoplasm of bone: Secondary | ICD-10-CM | POA: Diagnosis present

## 2014-05-13 DIAGNOSIS — Z86010 Personal history of colonic polyps: Secondary | ICD-10-CM | POA: Diagnosis not present

## 2014-05-13 DIAGNOSIS — D61810 Antineoplastic chemotherapy induced pancytopenia: Principal | ICD-10-CM | POA: Diagnosis present

## 2014-05-13 DIAGNOSIS — Z882 Allergy status to sulfonamides status: Secondary | ICD-10-CM | POA: Diagnosis not present

## 2014-05-13 DIAGNOSIS — T451X5A Adverse effect of antineoplastic and immunosuppressive drugs, initial encounter: Secondary | ICD-10-CM | POA: Diagnosis present

## 2014-05-13 DIAGNOSIS — D709 Neutropenia, unspecified: Secondary | ICD-10-CM

## 2014-05-13 DIAGNOSIS — K219 Gastro-esophageal reflux disease without esophagitis: Secondary | ICD-10-CM | POA: Diagnosis present

## 2014-05-13 DIAGNOSIS — R52 Pain, unspecified: Secondary | ICD-10-CM

## 2014-05-13 DIAGNOSIS — G43909 Migraine, unspecified, not intractable, without status migrainosus: Secondary | ICD-10-CM | POA: Diagnosis present

## 2014-05-13 DIAGNOSIS — M549 Dorsalgia, unspecified: Secondary | ICD-10-CM | POA: Diagnosis present

## 2014-05-13 DIAGNOSIS — D61818 Other pancytopenia: Secondary | ICD-10-CM

## 2014-05-13 DIAGNOSIS — Z9049 Acquired absence of other specified parts of digestive tract: Secondary | ICD-10-CM | POA: Diagnosis present

## 2014-05-13 DIAGNOSIS — Z66 Do not resuscitate: Secondary | ICD-10-CM | POA: Diagnosis present

## 2014-05-13 LAB — COMPREHENSIVE METABOLIC PANEL
ALT: 10 U/L (ref 0–35)
AST: 15 U/L (ref 0–37)
Albumin: 3.2 g/dL — ABNORMAL LOW (ref 3.5–5.2)
Alkaline Phosphatase: 232 U/L — ABNORMAL HIGH (ref 39–117)
Anion gap: 7 (ref 5–15)
BUN: 15 mg/dL (ref 6–23)
CO2: 26 mmol/L (ref 19–32)
Calcium: 6.2 mg/dL — CL (ref 8.4–10.5)
Chloride: 106 mEq/L (ref 96–112)
Creatinine, Ser: 0.54 mg/dL (ref 0.50–1.10)
GFR calc Af Amer: >90 mL/min (ref >90)
GFR calc non Af Amer: >90 mL/min (ref >90)
Glucose, Bld: 95 mg/dL (ref 70–99)
Potassium: 3.2 mmol/L — ABNORMAL LOW (ref 3.5–5.1)
Sodium: 139 mmol/L (ref 135–145)
Total Bilirubin: 1.1 mg/dL (ref 0.3–1.2)
Total Protein: 5.9 g/dL — ABNORMAL LOW (ref 6.0–8.3)

## 2014-05-13 LAB — URINE MICROSCOPIC-ADD ON

## 2014-05-13 LAB — URINALYSIS, ROUTINE W REFLEX MICROSCOPIC
Glucose, UA: NEGATIVE mg/dL
Hgb urine dipstick: NEGATIVE
Ketones, ur: NEGATIVE mg/dL
Nitrite: NEGATIVE
Protein, ur: 30 mg/dL — AB
Specific Gravity, Urine: 1.017 (ref 1.005–1.030)
Urobilinogen, UA: 1 mg/dL (ref 0.0–1.0)
pH: 7.5 (ref 5.0–8.0)

## 2014-05-13 LAB — CBC WITH DIFFERENTIAL/PLATELET
Basophils Absolute: 0 10*3/uL (ref 0.0–0.1)
Basophils Relative: 0 % (ref 0–1)
Eosinophils Absolute: 0 10*3/uL (ref 0.0–0.7)
Eosinophils Relative: 0 % (ref 0–5)
HCT: 17.3 % — ABNORMAL LOW (ref 36.0–46.0)
Hemoglobin: 5.7 g/dL — CL (ref 12.0–15.0)
Lymphocytes Relative: 57 % — ABNORMAL HIGH (ref 12–46)
Lymphs Abs: 0.8 10*3/uL (ref 0.7–4.0)
MCH: 28.1 pg (ref 26.0–34.0)
MCHC: 32.9 g/dL (ref 30.0–36.0)
MCV: 85.2 fL (ref 78.0–100.0)
Monocytes Absolute: 0.1 10*3/uL (ref 0.1–1.0)
Monocytes Relative: 10 % (ref 3–12)
Neutro Abs: 0.5 10*3/uL — ABNORMAL LOW (ref 1.7–7.7)
Neutrophils Relative %: 33 % — ABNORMAL LOW (ref 43–77)
Platelets: <5 10*3/uL — CL (ref 150–400)
RBC: 2.03 MIL/uL — ABNORMAL LOW (ref 3.87–5.11)
RDW: 16.4 % — ABNORMAL HIGH (ref 11.5–15.5)
WBC: 1.4 10*3/uL — CL (ref 4.0–10.5)

## 2014-05-13 LAB — PREPARE RBC (CROSSMATCH)

## 2014-05-13 MED ORDER — OXYCODONE HCL 5 MG PO TABS
5.0000 mg | ORAL_TABLET | ORAL | Status: DC | PRN
  Administered 2014-05-14 – 2014-05-16 (×4): 5 mg via ORAL
  Filled 2014-05-13 (×4): qty 1

## 2014-05-13 MED ORDER — POTASSIUM CHLORIDE CRYS ER 20 MEQ PO TBCR
20.0000 meq | EXTENDED_RELEASE_TABLET | Freq: Every day | ORAL | Status: DC
  Administered 2014-05-14 – 2014-05-17 (×4): 20 meq via ORAL
  Filled 2014-05-13 (×4): qty 1

## 2014-05-13 MED ORDER — ONDANSETRON HCL 4 MG/2ML IJ SOLN
4.0000 mg | Freq: Four times a day (QID) | INTRAMUSCULAR | Status: DC | PRN
  Administered 2014-05-15: 4 mg via INTRAVENOUS
  Filled 2014-05-13: qty 2

## 2014-05-13 MED ORDER — METOPROLOL SUCCINATE ER 50 MG PO TB24
50.0000 mg | ORAL_TABLET | Freq: Every morning | ORAL | Status: DC
  Administered 2014-05-14 – 2014-05-17 (×4): 50 mg via ORAL
  Filled 2014-05-13 (×4): qty 1

## 2014-05-13 MED ORDER — KETOROLAC TROMETHAMINE 15 MG/ML IJ SOLN
7.5000 mg | Freq: Once | INTRAMUSCULAR | Status: AC
  Administered 2014-05-13: 7.5 mg via INTRAVENOUS
  Filled 2014-05-13: qty 1

## 2014-05-13 MED ORDER — PANTOPRAZOLE SODIUM 40 MG PO TBEC
40.0000 mg | DELAYED_RELEASE_TABLET | Freq: Every day | ORAL | Status: DC
  Administered 2014-05-14 – 2014-05-17 (×4): 40 mg via ORAL
  Filled 2014-05-13 (×5): qty 1

## 2014-05-13 MED ORDER — HYDROCHLOROTHIAZIDE 25 MG PO TABS
12.5000 mg | ORAL_TABLET | Freq: Every morning | ORAL | Status: DC
  Filled 2014-05-13: qty 0.5

## 2014-05-13 MED ORDER — ONDANSETRON HCL 4 MG/2ML IJ SOLN
4.0000 mg | Freq: Once | INTRAMUSCULAR | Status: AC
  Administered 2014-05-13: 4 mg via INTRAVENOUS
  Filled 2014-05-13: qty 2

## 2014-05-13 MED ORDER — LORAZEPAM 0.5 MG PO TABS
0.5000 mg | ORAL_TABLET | Freq: Three times a day (TID) | ORAL | Status: DC
  Administered 2014-05-13 – 2014-05-17 (×10): 0.5 mg via ORAL
  Filled 2014-05-13 (×11): qty 1

## 2014-05-13 MED ORDER — BUPROPION HCL ER (SR) 150 MG PO TB12
150.0000 mg | ORAL_TABLET | Freq: Every morning | ORAL | Status: DC
  Administered 2014-05-14 – 2014-05-17 (×4): 150 mg via ORAL
  Filled 2014-05-13 (×4): qty 1

## 2014-05-13 MED ORDER — POTASSIUM CHLORIDE CRYS ER 20 MEQ PO TBCR
40.0000 meq | EXTENDED_RELEASE_TABLET | Freq: Once | ORAL | Status: AC
  Administered 2014-05-13: 40 meq via ORAL
  Filled 2014-05-13: qty 2

## 2014-05-13 MED ORDER — SODIUM CHLORIDE 0.9 % IV SOLN: 10.0000 mL/h | Freq: Once | INTRAVENOUS | Status: DC

## 2014-05-13 MED ORDER — HYDROMORPHONE HCL 1 MG/ML IJ SOLN
1.0000 mg | Freq: Once | INTRAMUSCULAR | Status: AC
  Administered 2014-05-13: 1 mg via INTRAVENOUS
  Filled 2014-05-13: qty 1

## 2014-05-13 MED ORDER — ALPRAZOLAM 0.5 MG PO TABS
0.5000 mg | ORAL_TABLET | Freq: Every day | ORAL | Status: DC
  Administered 2014-05-13 – 2014-05-16 (×4): 0.5 mg via ORAL
  Filled 2014-05-13 (×4): qty 1

## 2014-05-13 MED ORDER — MORPHINE SULFATE 2 MG/ML IJ SOLN: 1.0000 mg | INTRAMUSCULAR | Status: DC | PRN

## 2014-05-13 MED ORDER — SODIUM CHLORIDE 0.9 % IV SOLN
INTRAVENOUS | Status: DC
  Administered 2014-05-14: 10:00:00 via INTRAVENOUS

## 2014-05-13 MED ORDER — SODIUM CHLORIDE 0.9 % IV BOLUS (SEPSIS)
1000.0000 mL | Freq: Once | INTRAVENOUS | Status: AC
  Administered 2014-05-13: 1000 mL via INTRAVENOUS

## 2014-05-13 MED ORDER — ONDANSETRON HCL 4 MG PO TABS
4.0000 mg | ORAL_TABLET | Freq: Four times a day (QID) | ORAL | Status: DC | PRN
  Administered 2014-05-14 – 2014-05-17 (×4): 4 mg via ORAL
  Filled 2014-05-13 (×4): qty 1

## 2014-05-13 MED ORDER — PROCHLORPERAZINE MALEATE 10 MG PO TABS
10.0000 mg | ORAL_TABLET | Freq: Four times a day (QID) | ORAL | Status: DC | PRN
  Filled 2014-05-13: qty 1

## 2014-05-13 MED ORDER — ATORVASTATIN CALCIUM 80 MG PO TABS
80.0000 mg | ORAL_TABLET | Freq: Every morning | ORAL | Status: DC
  Administered 2014-05-14 – 2014-05-17 (×4): 80 mg via ORAL
  Filled 2014-05-13 (×4): qty 1

## 2014-05-13 NOTE — H&P (Signed)
[facility]                                                                                    Patient Demographics  [name], is a 73 y.o. female  CSN: [id number]  MRN: [id number]  DOB - Apr 04, 1941  Admit Date - 05/13/2014  Outpatient Primary MD for the patient is [name], MD   With History of -  Past Medical History  Diagnosis Date  . GERD (gastroesophageal reflux disease)   . IBS (irritable bowel syndrome)   . Adenomatous colon polyp 06/2009  . Hiatal hernia   . Breast cyst   . Diverticulosis   . Hemorrhoids   . Hyperlipidemia   . Hypertension   . Hyperparathyroidism   . Anxiety   . Migraine   . Osteoporosis   . Vitamin D deficiency   . Spinal stenosis   . C. difficile colitis 2006  . Small cell lung carcinoma dx'd 02/2014  . Bone metastases dx'd 02/2014      Past Surgical History  Procedure Laterality Date  . Cholecystectomy    . Vaginal hysterectomy    . Knee arthroscopy      left  . Parathyroidectomy  2009    [facility]  . Lumbar spine surgery  2001  . Breast biopsy      bilateral  . Total knee arthroplasty      right  . Bladder repair    . Video bronchoscopy Bilateral 03/14/2014    Procedure: VIDEO BRONCHOSCOPY WITHOUT FLUORO;  Surgeon: Tanda Rockers, MD;  Location: [facility];  Service: Cardiopulmonary;  Laterality: Bilateral;    in for   Chief Complaint  Patient presents with  . Joint Pain  . Cancer     HPI  [name]  is a 73 y.o. 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 Has nausea but no vomiting . No fevers, chills diarrhea but has tremors that started after chemotherapy. In the emergency room she was noted to be severely  neutropenic and 2 blood units were ordered for transfusion. Patient complaining of back pain radiating to the shoulders bilaterally.    Review of  Systems    In addition to the HPI above,  No Fever-chills, No Headache, No changes with Vision or hearing, No problems swallowing food or Liquids, No Chest pain,  No Abdominal pain, No Nausea or Vommitting, Bowel movements are regular, No Blood in stool or Urine, No dysuria, No new skin rashes or bruises, No new joints pains-aches,  No new weakness, tingling, numbness in any extremity, No recent weight gain or loss, No polyuria, polydypsia or polyphagia, No significant Mental Stressors.  A full 10 point Review of Systems was done, except as stated above, all other Review of Systems were negative.   Social History History  Substance Use Topics  . Smoking status: Former Smoker -- 0.25 packs/day for 25 years    Types: Cigarettes    Quit date: 05/19/2013  . Smokeless tobacco: Never Used  . Alcohol Use: No  Family History Family History  Problem Relation Age of Onset  . Colon polyps Sister   . Irritable bowel syndrome Sister   . Breast cancer Sister   . Diabetes Sister     X 3  . Diabetes Brother   . Colon cancer Neg Hx      Prior to Admission medications   Medication Sig Start Date End Date Taking? Authorizing Provider  ALPRAZolam Duanne Moron) 0.5 MG tablet Take 0.5 mg by mouth at bedtime.     Yes Historical Provider, MD  atorvastatin (LIPITOR) 80 MG tablet Take 80 mg by mouth every morning.    Yes Historical Provider, MD  buPROPion (WELLBUTRIN SR) 150 MG 12 hr tablet Take 150 mg by mouth every morning.    Yes Historical Provider, MD  CALCIUM PO Take 1 tablet by mouth daily.   Yes Historical Provider, MD  dexlansoprazole (DEXILANT) 60 MG capsule TAKE 1 CAPSULE (60 MG TOTAL) BY MOUTH DAILY. Patient taking differently: Take 60 mg by mouth every morning.  07/20/13  Yes Ladene Artist, MD  Diclofenac (ZORVOLEX) 35 MG CAPS Take 35 mg by mouth every morning.    Yes Historical Provider, MD  glycopyrrolate (ROBINUL) 2 MG tablet TAKE 1 TABLET (2 MG TOTAL) BY MOUTH 2 (TWO) TIMES  DAILY. 07/20/13  Yes Ladene Artist, MD  hydrochlorothiazide (HYDRODIURIL) 12.5 MG tablet Take 1 tablet by mouth every morning.  02/17/14  Yes Historical Provider, MD  lidocaine-prilocaine (EMLA) cream Apply 1 application topically as needed. Apply to port 1 hr before chemo 04/11/14  Yes Curt Bears, MD  LORazepam (ATIVAN) 0.5 MG tablet Take 1 tablet (0.5 mg total) by mouth every 8 (eight) hours. 04/11/14  Yes Curt Bears, MD  metoprolol (TOPROL-XL) 50 MG 24 hr tablet Take 50 mg by mouth every morning.    Yes Historical Provider, MD  oxycodone (OXY-IR) 5 MG capsule 1-2 every 4 hours as needed for back pain Patient taking differently: Take 5-10 mg by mouth every 4 (four) hours as needed for pain.  04/07/14  Yes Tanda Rockers, MD  Polyethylene Glycol 3350 (MIRALAX PO) Take 1 scoop by mouth daily as needed (for constipation).    Yes Historical Provider, MD  potassium chloride SA (K-DUR,KLOR-CON) 20 MEQ tablet Take 1 tablet (20 mEq total) by mouth daily. 04/25/14  Yes Drue Second, NP  prochlorperazine (COMPAZINE) 10 MG tablet Take 1 tablet (10 mg total) by mouth every 6 (six) hours as needed for nausea or vomiting. 05/11/14  Yes Curt Bears, MD    Allergies  Allergen Reactions  . Ace Inhibitors     REACTION: causing cough  . Cephalexin Rash  . Etodolac Rash  . Gabapentin Rash    Physical Exam  Vitals  Blood pressure 137/58, pulse 122, temperature 98.2 F (36.8 C), temperature source Oral, resp. rate 16, SpO2 94 %.   1. General looks chronically ill with tremors  2.  Not Suicidal or Homicidal, Awake Alert, Oriented X 3.  3. No F.N deficits, grossly, ALL C.Nerves Intact,   4. Ears and Eyes appear Normal, Conjunctivae pale, PERRLA. Dry Oral Mucosa.  5. Supple Neck, No JVD, No cervical lymphadenopathy appriciated, No Carotid Bruits.  6. Symmetrical Chest wall movement, Good air movement bilaterally, decreased.  7. RRR, No Gallops, Rubs or Murmurs, No Parasternal  Heave.  8. Positive Bowel Sounds, Abdomen Soft, Non tender, No organomegaly appriciated,No rebound -guarding or rigidity.  9.  No Cyanosis, Decreased Skin Turgor, No Skin Rash or Bruise.  10.  Good muscle tone,  joints appear normal , no effusions, Normal ROM.  11. No Palpable Lymph Nodes in Neck or Axillae    Data Review  CBC  Recent Labs Lab 05/09/14 1221 05/13/14 1605  WBC 0.6* 1.4*  HGB 8.1* 5.7*  HCT 24.2* 17.3*  PLT 89* <5*  MCV 84.0 85.2  MCH 28.1 28.1  MCHC 33.5 32.9  RDW 16.9* 16.4*  LYMPHSABS 0.4* 0.8  MONOABS 0.0* 0.1  EOSABS 0.0 0.0  BASOSABS 0.0 0.0   ------------------------------------------------------------------------------------------------------------------  Chemistries   Recent Labs Lab 05/09/14 1222 05/13/14 1605  NA 137 139  K 3.0* 3.2*  CL  --  106  CO2 34* 26  GLUCOSE 103 95  BUN 23.4 15  CREATININE 0.7 0.54  CALCIUM 7.4* 6.2*  AST 15 15  ALT 11 10  ALKPHOS 271* 232*  BILITOT 1.84* 1.1   ------------------------------------------------------------------------------------------------------------------ estimated creatinine clearance is 57.2 mL/min (by C-G formula based on Cr of 0.54). ------------------------------------------------------------------------------------------------------------------ No results for input(s): TSH, T4TOTAL, T3FREE, THYROIDAB in the last 72 hours.  Invalid input(s): FREET3   Coagulation profile No results for input(s): INR, PROTIME in the last 168 hours. ------------------------------------------------------------------------------------------------------------------- No results for input(s): DDIMER in the last 72 hours. -------------------------------------------------------------------------------------------------------------------  Cardiac Enzymes No results for input(s): CKMB, TROPONINI, MYOGLOBIN in the last 168 hours.  Invalid input(s):  CK ------------------------------------------------------------------------------------------------------------------ Invalid input(s): POCBNP   ---------------------------------------------------------------------------------------------------------------  Urinalysis    Component Value Date/Time   COLORURINE YELLOW 01/25/2010 1100   APPEARANCEUR CLEAR 01/25/2010 1100   LABSPEC 1.008 01/25/2010 1100   PHURINE 6.0 01/25/2010 1100   GLUCOSEU NEGATIVE 01/25/2010 1100   HGBUR NEGATIVE 01/25/2010 1100   BILIRUBINUR NEGATIVE 01/25/2010 1100   KETONESUR NEGATIVE 01/25/2010 1100   PROTEINUR NEGATIVE 01/25/2010 1100   UROBILINOGEN 1.0 01/25/2010 1100   NITRITE NEGATIVE 01/25/2010 1100   LEUKOCYTESUR  01/25/2010 1100    NEGATIVE MICROSCOPIC NOT DONE ON URINES WITH NEGATIVE PROTEIN, BLOOD, LEUKOCYTES, NITRITE, OR GLUCOSE <1000 mg/dL.    ----------------------------------------------------------------------------------------------------------------    Imaging results:   Ct Chest W Contrast  05/01/2014   CLINICAL DATA:  Subsequent treatment strategy for squamous cell carcinoma with bone metastasis.  EXAM: CT CHEST, ABDOMEN, AND PELVIS WITH CONTRAST  TECHNIQUE: Multidetector CT imaging of the chest, abdomen and pelvis was performed following the standard protocol during bolus administration of intravenous contrast.  CONTRAST:  160mL OMNIPAQUE IOHEXOL 300 MG/ML  SOLN  COMPARISON:  02/20/2014  FINDINGS: CT CHEST FINDINGS  Mediastinum: The heart size appears normal. No pericardial effusion identified. Calcified atherosclerotic disease involves the thoracic aorta. Calcification within the RCA Coronary artery also noted. Index right paratracheal lymph node measures 1.5 cm, image 24/ series 2. Previously 2.5 cm. Sub- carinal lymph node measures 1.5 cm, image 30/ series 2. Previously 2.8 cm. The right hilar lymph node measures 1.9 cm, image 28/ series 2. Previously 3.1 cm.  Lungs/Pleura: There is no  pleural effusion identified. Moderate changes of centrilobular and paraseptal emphysema are noted in both upper and lower lobes. Bilateral pulmonary nodules are identified compatible with metastatic disease. Index nodule in the left lower lobe measures 9 mm, image 46/ series 4. This is new from the previous exam. Also new from the prior exam is a 9 mm left lower lobe nodule, image 42/series 4.Improvement in pleural nodularity overlying the right lung. The index lesion within the posterior medial right lower lobe measures 1.1 x 0.5 cm, image 32/series 4. Previously 2.5 x 1.1 cm.  Musculoskeletal: Review of the visualized osseous structures shows  multi focal areas of bone metastases involving the thoracic and lumbar spine. Pathologic fracture involving the T9 vertebra has progressed from the previous exam. There is a new lucent lesion involving the posterior aspect of the T8 vertebra measuring 1.2 cm, image 61 of 603. Mixed lytic and sclerotic bone lesion involving T12 appears stable.  CT ABDOMEN AND PELVIS FINDINGS  Hepatobiliary: There is no suspicious liver abnormality identified. Previous cholecystectomy.  Pancreas: Normal appearance of the pancreas.  Spleen: The spleen appears normal.  Adrenals/Urinary Tract: 7 mm right adrenal nodule is unchanged. The 1.1 cm right adrenal nodule is also stable. Normal appearance of the left adrenal gland. Bilateral renal cysts are identified. The urinary bladder appears normal.  Stomach/Bowel: The stomach appears normal. The small bowel loops have a normal course and caliber without obstruction. Normal appearance of the appendix. Multiple colonic diverticula identified without acute inflammation.  Vascular/Lymphatic: Calcified atherosclerotic disease involves the abdominal aorta. There is no aneurysm. The aorta has a maximum AP dimension of 2.4 cm. No enlarged retroperitoneal or mesenteric adenopathy. No enlarged pelvic or inguinal lymph nodes.  Reproductive: Previous  hysterectomy.  There is no adnexal mass.  Other: There is no ascites or focal fluid collections within the abdomen or pelvis.  Musculoskeletal: Multi focal bone metastases are again identified. The patient is status post posterior hardware fixation of the lower lumbar spine. There is a lytic lesion involving the right iliac wing measuring 1.6 cm, image 88/ series 2. Previously 1.3 cm. 2.8 cm sclerotic lesion within the posterior right iliac bone is identified. Unchanged from previous study.  IMPRESSION: 1. No acute findings within the chest abdomen or pelvis. 2. Mixed interval response to therapy. There has been decrease in size of right hilar mass and mediastinal lymph nodes. Additionally, pleural nodularity overlying the right lung has decreased in the interval. 3. There are several new pulmonary nodules in the left lower lobe, worrisome for metastatic disease. 4. Multi focal bone metastasis are again noted. These appear stable to slightly progressed from previous exam. Specifically, there is a lesion in the right iliac wing which is slightly increased in the interval. There is also a lesion involving T8 which appears new from 03/02/2014. Progression of pathologic T9 fracture.   Electronically Signed   By: Kerby Moors M.D.   On: 05/01/2014 15:10   Ct Abdomen Pelvis W Contrast  05/01/2014   CLINICAL DATA:  Subsequent treatment strategy for squamous cell carcinoma with bone metastasis.  EXAM: CT CHEST, ABDOMEN, AND PELVIS WITH CONTRAST  TECHNIQUE: Multidetector CT imaging of the chest, abdomen and pelvis was performed following the standard protocol during bolus administration of intravenous contrast.  CONTRAST:  17mL OMNIPAQUE IOHEXOL 300 MG/ML  SOLN  COMPARISON:  02/20/2014  FINDINGS: CT CHEST FINDINGS  Mediastinum: The heart size appears normal. No pericardial effusion identified. Calcified atherosclerotic disease involves the thoracic aorta. Calcification within the RCA Coronary artery also noted. Index  right paratracheal lymph node measures 1.5 cm, image 24/ series 2. Previously 2.5 cm. Sub- carinal lymph node measures 1.5 cm, image 30/ series 2. Previously 2.8 cm. The right hilar lymph node measures 1.9 cm, image 28/ series 2. Previously 3.1 cm.  Lungs/Pleura: There is no pleural effusion identified. Moderate changes of centrilobular and paraseptal emphysema are noted in both upper and lower lobes. Bilateral pulmonary nodules are identified compatible with metastatic disease. Index nodule in the left lower lobe measures 9 mm, image 46/ series 4. This is new from the previous exam. Also new from  the prior exam is a 9 mm left lower lobe nodule, image 42/series 4.Improvement in pleural nodularity overlying the right lung. The index lesion within the posterior medial right lower lobe measures 1.1 x 0.5 cm, image 32/series 4. Previously 2.5 x 1.1 cm.  Musculoskeletal: Review of the visualized osseous structures shows multi focal areas of bone metastases involving the thoracic and lumbar spine. Pathologic fracture involving the T9 vertebra has progressed from the previous exam. There is a new lucent lesion involving the posterior aspect of the T8 vertebra measuring 1.2 cm, image 61 of 603. Mixed lytic and sclerotic bone lesion involving T12 appears stable.  CT ABDOMEN AND PELVIS FINDINGS  Hepatobiliary: There is no suspicious liver abnormality identified. Previous cholecystectomy.  Pancreas: Normal appearance of the pancreas.  Spleen: The spleen appears normal.  Adrenals/Urinary Tract: 7 mm right adrenal nodule is unchanged. The 1.1 cm right adrenal nodule is also stable. Normal appearance of the left adrenal gland. Bilateral renal cysts are identified. The urinary bladder appears normal.  Stomach/Bowel: The stomach appears normal. The small bowel loops have a normal course and caliber without obstruction. Normal appearance of the appendix. Multiple colonic diverticula identified without acute inflammation.   Vascular/Lymphatic: Calcified atherosclerotic disease involves the abdominal aorta. There is no aneurysm. The aorta has a maximum AP dimension of 2.4 cm. No enlarged retroperitoneal or mesenteric adenopathy. No enlarged pelvic or inguinal lymph nodes.  Reproductive: Previous hysterectomy.  There is no adnexal mass.  Other: There is no ascites or focal fluid collections within the abdomen or pelvis.  Musculoskeletal: Multi focal bone metastases are again identified. The patient is status post posterior hardware fixation of the lower lumbar spine. There is a lytic lesion involving the right iliac wing measuring 1.6 cm, image 88/ series 2. Previously 1.3 cm. 2.8 cm sclerotic lesion within the posterior right iliac bone is identified. Unchanged from previous study.  IMPRESSION: 1. No acute findings within the chest abdomen or pelvis. 2. Mixed interval response to therapy. There has been decrease in size of right hilar mass and mediastinal lymph nodes. Additionally, pleural nodularity overlying the right lung has decreased in the interval. 3. There are several new pulmonary nodules in the left lower lobe, worrisome for metastatic disease. 4. Multi focal bone metastasis are again noted. These appear stable to slightly progressed from previous exam. Specifically, there is a lesion in the right iliac wing which is slightly increased in the interval. There is also a lesion involving T8 which appears new from 03/02/2014. Progression of pathologic T9 fracture.   Electronically Signed   By: Kerby Moors M.D.   On: 05/01/2014 15:10   Ir Fluoro Guide Cv Line Right  04/27/2014   CLINICAL DATA:  73 year old with small cell carcinoma of the lung. Port-A-Cath needed for chemotherapy.  EXAM: FLUOROSCOPIC AND ULTRASOUND GUIDED PLACEMENT OF A SUBCUTANEOUS PORT.  Physician: Stephan Minister. Henn, MD  FLUOROSCOPY TIME:  18 seconds, 2 mGy  MEDICATIONS AND MEDICAL HISTORY: Vancomycin 1 g, 2 mg versed, 100 mcg fentanyl. Vancomycin was given  within two hours of incision. Vancomycin was given due to a cephalosporin allergy. A radiology nurse monitored the patient for moderate sedation.  ANESTHESIA/SEDATION: Moderate sedation time: 40 minutes  PROCEDURE: The risks of the procedure were explained to the patient. Informed consent was obtained. Patient was placed supine on the interventional table. Ultrasound confirmed a patent right internal jugular vein. The right chest and neck were cleaned with a skin antiseptic and a sterile drape was placed. Maximal  barrier sterile technique was utilized including caps, mask, sterile gowns, sterile gloves, sterile drape, hand hygiene and skin antiseptic. The right neck was anesthetized with 1% lidocaine. Small incision was made in the right neck with a blade. Micropuncture set was placed in the right internal jugular vein with ultrasound guidance. The micropuncture wire was used for measurement purposes. The right chest was anesthetized with 1% lidocaine with epinephrine. #15 blade was used to make an incision and a subcutaneous port pocket was formed. [place] was assembled. Subcutaneous tunnel was formed with a stiff tunneling device. The port catheter was brought through the subcutaneous tunnel. The port was placed in the subcutaneous pocket. The micropuncture set was exchanged for a peel-away sheath. The catheter was placed through the peel-away sheath and the tip was positioned at the superior cavoatrial junction. Catheter placement was confirmed with fluoroscopy. The port was accessed and flushed with heparinized saline. The port pocket was closed using two layers of absorbable sutures and Dermabond. The vein skin site was closed using a single layer of absorbable suture and Dermabond. Sterile dressings were applied. Patient tolerated the procedure well without an immediate complication. Ultrasound and fluoroscopic images were taken and saved for this procedure.  COMPLICATIONS: None  IMPRESSION:  Placement of a subcutaneous port device. The catheter tip at the superior cavoatrial junction and ready to be used.   Electronically Signed   By: Markus Daft M.D.   On: 04/27/2014 19:12   Ir US Guide Vasc Access Right  04/27/2014   CLINICAL DATA:  73 year old with small cell carcinoma of the lung. Port-A-Cath needed for chemotherapy.  EXAM: FLUOROSCOPIC AND ULTRASOUND GUIDED PLACEMENT OF A SUBCUTANEOUS PORT.  Physician: Stephan Minister. Henn, MD  FLUOROSCOPY TIME:  18 seconds, 2 mGy  MEDICATIONS AND MEDICAL HISTORY: Vancomycin 1 g, 2 mg versed, 100 mcg fentanyl. Vancomycin was given within two hours of incision. Vancomycin was given due to a cephalosporin allergy. A radiology nurse monitored the patient for moderate sedation.  ANESTHESIA/SEDATION: Moderate sedation time: 40 minutes  PROCEDURE: The risks of the procedure were explained to the patient. Informed consent was obtained. Patient was placed supine on the interventional table. Ultrasound confirmed a patent right internal jugular vein. The right chest and neck were cleaned with a skin antiseptic and a sterile drape was placed. Maximal barrier sterile technique was utilized including caps, mask, sterile gowns, sterile gloves, sterile drape, hand hygiene and skin antiseptic. The right neck was anesthetized with 1% lidocaine. Small incision was made in the right neck with a blade. Micropuncture set was placed in the right internal jugular vein with ultrasound guidance. The micropuncture wire was used for measurement purposes. The right chest was anesthetized with 1% lidocaine with epinephrine. #15 blade was used to make an incision and a subcutaneous port pocket was formed. [place] was assembled. Subcutaneous tunnel was formed with a stiff tunneling device. The port catheter was brought through the subcutaneous tunnel. The port was placed in the subcutaneous pocket. The micropuncture set was exchanged for a peel-away sheath. The catheter was placed through  the peel-away sheath and the tip was positioned at the superior cavoatrial junction. Catheter placement was confirmed with fluoroscopy. The port was accessed and flushed with heparinized saline. The port pocket was closed using two layers of absorbable sutures and Dermabond. The vein skin site was closed using a single layer of absorbable suture and Dermabond. Sterile dressings were applied. Patient tolerated the procedure well without an immediate complication. Ultrasound  and fluoroscopic images were taken and saved for this procedure.  COMPLICATIONS: None  IMPRESSION: Placement of a subcutaneous port device. The catheter tip at the superior cavoatrial junction and ready to be used.   Electronically Signed   By: Markus Daft M.D.   On: 04/27/2014 19:12      Assessment & Plan   1. Small cell lung CA, metastatic, on chemotherapy 2. Pancytopenia with severe anemia and thrombocytopenia 3.Tremors 4. Back pain uncontrolled by oxycodone  Plan  Admit to regular floor Neutropenic precautions Transfuse 2 units of packed RBCs Check CBC in a.m. No need for transfusion since patient is not actively bleeding Consult oncology in a.m. no need for antibiotics since patient is afebrile and has no signs or symptoms of infection       AM Labs Ordered, also please review Full Orders  Family Communication: Admission, patients condition and plan of care including tests being ordered have been discussed with the patient and husband  who indicate understanding and agree with the plan and Code Status.  Code StatusDO NOT RESUSCITATE  Disposition Plan: Home with home health  Time spent in minutes : 36 minutes  Condition Critical  @SIGNATURE @

## 2014-05-13 NOTE — ED Notes (Signed)
Patient has history of lung cancer with metastasis to the bones. She complains of increases pain today not relieved by her oxycodone at home. She denies fever but does have cold symptoms. Ambulation has been very difficult today, patient walks with a cane. Patient is has had 3 rounds of chemo and will have another round in 2 weeks.

## 2014-05-14 LAB — CBC
HCT: 23 % — ABNORMAL LOW (ref 36.0–46.0)
Hemoglobin: 8 g/dL — ABNORMAL LOW (ref 12.0–15.0)
MCH: 29.6 pg (ref 26.0–34.0)
MCHC: 34.8 g/dL (ref 30.0–36.0)
MCV: 85.2 fL (ref 78.0–100.0)
Platelets: <5 10*3/uL — CL (ref 150–400)
RBC: 2.7 MIL/uL — ABNORMAL LOW (ref 3.87–5.11)
RDW: 15.5 % (ref 11.5–15.5)
WBC: 2 10*3/uL — ABNORMAL LOW (ref 4.0–10.5)

## 2014-05-14 LAB — BASIC METABOLIC PANEL
Anion gap: 5 (ref 5–15)
BUN: 19 mg/dL (ref 6–23)
CO2: 29 mmol/L (ref 19–32)
Calcium: 6.1 mg/dL — CL (ref 8.4–10.5)
Chloride: 102 mEq/L (ref 96–112)
Creatinine, Ser: 0.79 mg/dL (ref 0.50–1.10)
GFR calc Af Amer: >90 mL/min (ref >90)
GFR, EST NON AFRICAN AMERICAN: 81 mL/min — AB (ref >90)
Glucose, Bld: 89 mg/dL (ref 70–99)
Potassium: 4.2 mmol/L (ref 3.5–5.1)
Sodium: 136 mmol/L (ref 135–145)

## 2014-05-14 MED ORDER — SODIUM CHLORIDE 0.9 % IV SOLN
Freq: Once | INTRAVENOUS | Status: AC
  Administered 2014-05-14: 11:00:00 via INTRAVENOUS

## 2014-05-14 MED ORDER — HYDROCHLOROTHIAZIDE 12.5 MG PO CAPS
12.5000 mg | ORAL_CAPSULE | Freq: Every day | ORAL | Status: DC
  Administered 2014-05-14 – 2014-05-17 (×4): 12.5 mg via ORAL
  Filled 2014-05-14 (×4): qty 1

## 2014-05-14 MED ORDER — SODIUM CHLORIDE 0.9 % IV SOLN
1.0000 g | Freq: Once | INTRAVENOUS | Status: AC
  Administered 2014-05-14: 1 g via INTRAVENOUS
  Filled 2014-05-14: qty 10

## 2014-05-14 NOTE — Progress Notes (Signed)
Patient ID: [name], female   DOB: 04-Sep-1940, 73 y.o.   MRN: [id number] [facility] PROGRESS NOTE  [name] TGG:[id number] DOB: [birth year] DOA: 05/13/2014 PCP: [name], MD  Brief narrative: 73 y.o. 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 Has nausea but no vomiting . No fevers, chills diarrhea but has tremors that started after chemotherapy. In the emergency room she was noted to be severely neutropenic and 2 blood units were ordered for transfusion. Patient complaining of back pain radiating to the shoulders bilaterally.  Assessment and Plan:    Active Problems:   Pancytopenia, severe - secondary to chemo - transfused 2 U PRBC with appropriate increase in Hg - WBC also better this am - will transfuse two U of Plt's this AM - give one dose of Neupogen - repeat CBC in AM   Lung cancer with metastatic disease - will notify D.r Julien Nordmann of pt's admission    Acute pain - provide analgesia as needed   HTN - continue home medical regimen    PCM, moderate  - in the context of acute on chronic illness - advance diet as pt able to tolerate   DVT prophylaxis  SCD's  Code Status: DNR Family Communication: Pt and husband at bedside Disposition Plan: Home when medically stable   IV Access:   Peripheral IV Procedures and diagnostic studies:    No results found.  Medical Consultants:   None Other Consultants:   Physical therapy  Anti-Infectives:   None   Faye Ramsay, MD  [facility] Pager [phone]  If 7PM-7AM, please contact night-coverage [email or web address] Password [email or web address] 05/14/2014, 10:24 AM   LOS: 1 day   HPI/Subjective: No events overnight.   Objective: Filed Vitals:   05/14/14 0200 05/14/14 0230 05/14/14 0518 05/14/14 0618  BP: 137/48 119/62 120/58 116/75  Pulse: 107 105 101 94  Temp: 98.8 F (37.1 C) 98.9 F (37.2  C) 98.6 F (37 C) 98.1 F (36.7 C)  TempSrc: Oral Oral Oral Oral  Resp: 16 16 18 18   Height:      Weight:      SpO2: 94% 95% 96% 100%    Intake/Output Summary (Last 24 hours) at 05/14/14 1024 Last data filed at 05/14/14 0830  Gross per 24 hour  Intake 1198.33 ml  Output      2 ml  Net 1196.33 ml    Exam:   General:  Pt is alert, follows commands appropriately, not in acute distress  Cardiovascular: Regular rate and rhythm, S1/S2, no rubs, no gallops  Respiratory: Clear to auscultation bilaterally, no wheezing, no crackles, no rhonchi  Abdomen: Soft, non tender, non distended, bowel sounds present, no guarding   Data Reviewed: Basic Metabolic Panel:  Recent Labs Lab 05/09/14 1222 05/13/14 1605 05/14/14 0820  NA 137 139 136  K 3.0* 3.2* 4.2  CL  --  106 102  CO2 34* 26 29  GLUCOSE 103 95 89  BUN 23.4 15 19   CREATININE 0.7 0.54 0.79  CALCIUM 7.4* 6.2* 6.1*   Liver Function Tests:  Recent Labs Lab 05/09/14 1222 05/13/14 1605  AST 15 15  ALT 11 10  ALKPHOS 271* 232*  BILITOT 1.84* 1.1  PROT 6.5 5.9*  ALBUMIN 3.3* 3.2*   CBC:  Recent Labs Lab 05/09/14 1221 05/13/14 1605 05/14/14 0820  WBC 0.6* 1.4* 2.0*  NEUTROABS 0.1* 0.5*  --  HGB 8.1* 5.7* 8.0*  HCT 24.2* 17.3* 23.0*  MCV 84.0 85.2 85.2  PLT 89* <5* <5*    Scheduled Meds: . ALPRAZolam  0.5 mg Oral QHS  . atorvastatin  80 mg Oral q morning - 10a  . buPROPion  150 mg Oral q morning - 10a  . calcium gluconate  1 g Intravenous Once  . hydrochlorothiazide  12.5 mg Oral Daily  . LORazepam  0.5 mg Oral 3 times per day  . metoprolol succinate  50 mg Oral q morning - 10a  . pantoprazole  40 mg Oral Daily  . potassium chloride SA  20 mEq Oral Daily   Continuous Infusions: . sodium chloride 75 mL/hr at 05/14/14 [phone]

## 2014-05-14 NOTE — Progress Notes (Addendum)
CRITICAL VALUE ALERT  Critical value received:  Ca 6.1  Date of notification:  05/14/14  Time of notification:  7253  Critical value read back:Yes.    Nurse who received alert:  Nonie Hoyer  MD notified (1st page):  Doyle Askew  Time of first page:  0914  MD notified (2nd page):  Time of second page:  Responding MD:  Doyle Askew  Time MD responded:  [phone]

## 2014-05-15 DIAGNOSIS — D701 Agranulocytosis secondary to cancer chemotherapy: Secondary | ICD-10-CM

## 2014-05-15 DIAGNOSIS — E43 Unspecified severe protein-calorie malnutrition: Secondary | ICD-10-CM | POA: Insufficient documentation

## 2014-05-15 DIAGNOSIS — D6959 Other secondary thrombocytopenia: Secondary | ICD-10-CM

## 2014-05-15 DIAGNOSIS — D6481 Anemia due to antineoplastic chemotherapy: Secondary | ICD-10-CM

## 2014-05-15 DIAGNOSIS — C3490 Malignant neoplasm of unspecified part of unspecified bronchus or lung: Secondary | ICD-10-CM

## 2014-05-15 LAB — PREPARE PLATELET PHERESIS
Unit division: 0
Unit division: 0

## 2014-05-15 LAB — CBC WITH DIFFERENTIAL/PLATELET
BASOS ABS: 0 10*3/uL (ref 0.0–0.1)
Basophils Relative: 0 % (ref 0–1)
EOS PCT: 0 % (ref 0–5)
Eosinophils Absolute: 0 10*3/uL (ref 0.0–0.7)
HCT: 21.3 % — ABNORMAL LOW (ref 36.0–46.0)
Hemoglobin: 7.3 g/dL — ABNORMAL LOW (ref 12.0–15.0)
LYMPHS PCT: 40 % (ref 12–46)
Lymphs Abs: 1.1 10*3/uL (ref 0.7–4.0)
MCH: 29.3 pg (ref 26.0–34.0)
MCHC: 34.3 g/dL (ref 30.0–36.0)
MCV: 85.5 fL (ref 78.0–100.0)
Monocytes Absolute: 0.4 10*3/uL (ref 0.1–1.0)
Monocytes Relative: 15 % — ABNORMAL HIGH (ref 3–12)
NEUTROS PCT: 45 % (ref 43–77)
Neutro Abs: 1.3 10*3/uL — ABNORMAL LOW (ref 1.7–7.7)
PLATELETS: 39 10*3/uL — AB (ref 150–400)
RBC: 2.49 MIL/uL — AB (ref 3.87–5.11)
RDW: 15.7 % — AB (ref 11.5–15.5)
WBC: 2.8 10*3/uL — AB (ref 4.0–10.5)

## 2014-05-15 LAB — URINE CULTURE: Colony Count: 40000

## 2014-05-15 LAB — BASIC METABOLIC PANEL
ANION GAP: 3 — AB (ref 5–15)
BUN: 13 mg/dL (ref 6–23)
CHLORIDE: 103 meq/L (ref 96–112)
CO2: 30 mmol/L (ref 19–32)
CREATININE: 0.81 mg/dL (ref 0.50–1.10)
Calcium: 6.4 mg/dL — CL (ref 8.4–10.5)
GFR calc Af Amer: 82 mL/min — ABNORMAL LOW (ref >90)
GFR calc non Af Amer: 71 mL/min — ABNORMAL LOW (ref >90)
Glucose, Bld: 86 mg/dL (ref 70–99)
POTASSIUM: 3.2 mmol/L — AB (ref 3.5–5.1)
SODIUM: 136 mmol/L (ref 135–145)

## 2014-05-15 LAB — PREPARE RBC (CROSSMATCH)

## 2014-05-15 MED ORDER — SODIUM CHLORIDE 0.9 % IV SOLN
1.0000 g | Freq: Once | INTRAVENOUS | Status: AC
  Administered 2014-05-15: 1 g via INTRAVENOUS
  Filled 2014-05-15: qty 10

## 2014-05-15 MED ORDER — SODIUM CHLORIDE 0.9 % IV SOLN: Freq: Once | INTRAVENOUS | Status: DC

## 2014-05-15 MED ORDER — SODIUM CHLORIDE 0.9 % IJ SOLN
10.0000 mL | INTRAMUSCULAR | Status: DC | PRN
  Administered 2014-05-15 – 2014-05-17 (×2): 10 mL
  Filled 2014-05-15: qty 40

## 2014-05-15 MED ORDER — ENSURE COMPLETE PO LIQD
237.0000 mL | Freq: Two times a day (BID) | ORAL | Status: DC
  Administered 2014-05-16 – 2014-05-17 (×2): 237 mL via ORAL

## 2014-05-15 MED ORDER — POTASSIUM CHLORIDE CRYS ER 20 MEQ PO TBCR
40.0000 meq | EXTENDED_RELEASE_TABLET | Freq: Once | ORAL | Status: AC
  Administered 2014-05-15: 40 meq via ORAL
  Filled 2014-05-15: qty 2

## 2014-05-15 NOTE — Progress Notes (Signed)
INITIAL NUTRITION ASSESSMENT  DOCUMENTATION CODES Per approved criteria  -Severe malnutrition in the context of chronic illness  Pt meets criteria for severe MALNUTRITION in the context of chronic illness as evidenced by severe fat depletion and 21% weight loss x 9 months.  INTERVENTION: -Provide Ensure Complete po BID, each supplement provides 350 kcal and 13 grams of protein -Encourage PO intake -RD to continue to monitor  NUTRITION DIAGNOSIS: Increased nutrient (protein) needs related to metastatic lung cancer as evidenced by 21% weight loss x 9 months.   Goal: Pt to meet >/= 90% of their estimated nutrition needs   Monitor:  PO and supplemental intake, weight, labs, I/O's  Reason for Assessment: Pt identified as at nutrition risk on the Malnutrition Screen Tool  Admitting Dx: Pancytopenia, severe  ASSESSMENT: 73 y.o. 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 Has nausea but no vomiting .   Pt followed by [place], last seen 12/17.  Pt and pt's husband in room. Pt reports fair appetite. Pt was eating lunch tray of grilled cheese and tomato soup during visit. Pt planned to eat most of it. PO intake: 100%.  Per weight history documentation, pt has lost 35 lb since March (21% weight loss x 9 months, significant for time frame).  Pt's husband states that she tries to drink 4 oz of Strawberry Ensure QID and keeps high protein snacks on hand. Informed patient that snacks can be provided on the floor. Pt agreeable to receiving Ensure supplements. RD to order.   Nutrition Focused Physical Exam:  Subcutaneous Fat:  Orbital Region: mild depletion Upper Arm Region: severe depletion Thoracic and Lumbar Region: NA  Muscle:  Temple Region: moderate depletion Clavicle Bone Region: moderate depletion Clavicle and Acromion Bone Region: moderate  depletion Scapular Bone Region: moderate depletion Dorsal Hand: mild depletion Patellar Region: NA Anterior Thigh Region: NA Posterior Calf Region: NA -pt eating lunch tray  Edema: no LE edema  Labs reviewed: Low K  Height: Ht Readings from Last 1 Encounters:  05/13/14 5\' 5"  (1.651 m)    Weight: Wt Readings from Last 1 Encounters:  05/13/14 130 lb 15.3 oz (59.4 kg)    Ideal Body Weight: 125 lb   % Ideal Body Weight: 104%  Wt Readings from Last 10 Encounters:  05/13/14 130 lb 15.3 oz (59.4 kg)  05/02/14 131 lb 6.4 oz (59.603 kg)  04/24/14 129 lb 9.6 oz (58.786 kg)  04/11/14 139 lb 8 oz (63.277 kg)  04/03/14 147 lb (66.679 kg)  03/28/14 147 lb 3.2 oz (66.769 kg)  03/17/14 155 lb 8 oz (70.534 kg)  03/09/14 163 lb (73.936 kg)  07/20/13 165 lb (74.844 kg)  06/07/12 158 lb 6.4 oz (71.85 kg)    Usual Body Weight: 165 lb  % Usual Body Weight: 79%  BMI:  Body mass index is 21.79 kg/(m^2).  Estimated Nutritional Needs: Kcal: 3716-9678 Protein: 90-100g Fluid: 1.8L/day  Skin: intact  Diet Order: Diet regular  EDUCATION NEEDS: -No education needs identified at this time   Intake/Output Summary (Last 24 hours) at 05/15/14 0958 Last data filed at 05/15/14 0942  Gross per 24 hour  Intake   1200 ml  Output      0 ml  Net   1200 ml    Last BM: 12/27  Labs:   Recent Labs Lab 05/13/14 1605 05/14/14 0820 05/15/14 0346  NA  139 136 136  K 3.2* 4.2 3.2*  CL 106 102 103  CO2 26 29 30   BUN 15 19 13   CREATININE 0.54 0.79 0.81  CALCIUM 6.2* 6.1* 6.4*  GLUCOSE 95 89 86    CBG (last 3)  No results for input(s): GLUCAP in the last 72 hours.  Scheduled Meds: . ALPRAZolam  0.5 mg Oral QHS  . atorvastatin  80 mg Oral q morning - 10a  . buPROPion  150 mg Oral q morning - 10a  . hydrochlorothiazide  12.5 mg Oral Daily  . LORazepam  0.5 mg Oral 3 times per day  . metoprolol succinate  50 mg Oral q morning - 10a  . pantoprazole  40 mg Oral Daily  . potassium  chloride SA  20 mEq Oral Daily    Continuous Infusions: . sodium chloride 75 mL/hr at 05/14/14 3151    Past Medical History  Diagnosis Date  . GERD (gastroesophageal reflux disease)   . IBS (irritable bowel syndrome)   . Adenomatous colon polyp 06/2009  . Hiatal hernia   . Breast cyst   . Diverticulosis   . Hemorrhoids   . Hyperlipidemia   . Hypertension   . Hyperparathyroidism   . Anxiety   . Migraine   . Osteoporosis   . Vitamin D deficiency   . Spinal stenosis   . C. difficile colitis 2006  . Small cell lung carcinoma dx'd 02/2014  . Bone metastases dx'd 02/2014    Past Surgical History  Procedure Laterality Date  . Cholecystectomy    . Vaginal hysterectomy    . Knee arthroscopy      left  . Parathyroidectomy  2009    [facility]  . Lumbar spine surgery  2001  . Breast biopsy      bilateral  . Total knee arthroplasty      right  . Bladder repair    . Video bronchoscopy Bilateral 03/14/2014    Procedure: VIDEO BRONCHOSCOPY WITHOUT FLUORO;  Surgeon: Tanda Rockers, MD;  Location: [facility];  Service: Cardiopulmonary;  Laterality: Bilateral;    Clayton Bibles, MS, RD, LDN Pager: [phone] After Hours Pager: [phone]

## 2014-05-15 NOTE — Progress Notes (Signed)
CRITICAL VALUE ALERT  Critical value received:  Calcium 6.4  Date of notification:  [id number]  Time of notification:  0445  Critical value read back:y  Nurse who received alert:  VMJ  MD notified (1st page):  Fredirick Maudlin  Time of first page:  0450  MD notified (2nd page):na  Time of second page:na  Responding MD:  Rogue Bussing  Time MD responded:  [phone]

## 2014-05-15 NOTE — Progress Notes (Signed)
DIAGNOSIS: Extensive stage, stage IV (T3, N2, M1b) small cell lung cancer diagnosed in October 2015.  PRIOR THERAPY: None  CURRENT THERAPY: Systemic chemotherapy with carboplatin for AUC of 5 on day 1 and etoposide 100 MG/M2 on days 1, 2 and 3 with Neulasta support on day 4. Status post 3 cycles.  Subjective: The patient is seen and examined today. She was admitted 2 days ago to the hospital complaining of increased pain in addition to nausea as well as significant weakness and fatigue. During her evaluation at the emergency department the patient was found to have neutropenia as well as severe anemia with hemoglobin of 5.7. The patient received 2 units of PRBCs transfusion. She also received Granix on outpatient basis for the neutropenia. Her white blood count has improved over the last few days. She denied having any significant fever or chills. Has mild nausea but no vomiting.  Objective: Vital signs in last 24 hours: Temp:  [98.1 F (36.7 C)-98.5 F (36.9 C)] 98.2 F (36.8 C) (12/28 0514) Pulse Rate:  [83-98] 83 (12/28 0514) Resp:  [18] 18 (12/28 0514) BP: (114-128)/(56-69) 114/60 mmHg (12/28 0514) SpO2:  [93 %-98 %] 93 % (12/28 0514)  Intake/Output from previous day: 12/27 0701 - 12/28 0700 In: 1200 [P.O.:240; I.V.:500; Blood:460] Out: -  Intake/Output this shift:    General appearance: alert, cooperative, fatigued and no distress Resp: clear to auscultation bilaterally Cardio: regular rate and rhythm, S1, S2 normal, no murmur, click, rub or gallop GI: soft, non-tender; bowel sounds normal; no masses,  no organomegaly Extremities: extremities normal, atraumatic, no cyanosis or edema  Lab Results:   Recent Labs  05/14/14 0820 05/15/14 0346  WBC 2.0* 2.8*  HGB 8.0* 7.3*  HCT 23.0* 21.3*  PLT <5* 39*   BMET  Recent Labs  05/14/14 0820 05/15/14 0346  NA 136 136  K 4.2 3.2*  CL 102 103  CO2 29 30  GLUCOSE 89 86  BUN 19 13  CREATININE 0.79 0.81  CALCIUM 6.1*  6.4*    Studies/Results: No results found.  Medications: I have reviewed the patient's current medications.   Assessment/Plan: 1) extensive stage small cell lung cancer: Status post 3 cycles of systemic chemotherapy was carboplatin and etoposide with significant improvement in her disease after cycle #2. The patient will continue with cycle #4 on outpatient basis in early January 2015 after discharge. 2) severe neutropenia: Status post Granix injection with improvement in her total white blood count and absolute neutrophil count.  3) severe chemotherapy-induced anemia: Status post PRBCs transfusion. Slowly improving. 4) chemotherapy-induced thrombocytopenia: Status post platelet transfusion her platelets count are up to 39,000. No need for further transfusion unless the patient is bleeding or platelets count less than 10,000. Thank you for taking good care of Mrs. March, I will continue to follow up the patient with you and assist in her management an as-needed basis.   LOS: 2 days    Bleu Minerd K. 05/15/2014

## 2014-05-15 NOTE — Progress Notes (Signed)
New orders given for pt w/  Critical lab value of Calcium 6.4. Will continue to monitor pt

## 2014-05-15 NOTE — Progress Notes (Signed)
Patient ID: [name], female   DOB: [birth year], 73 y.o.   MRN: [id number]  [facility] PROGRESS NOTE  [name] [id number] DOB: August 13, 1940 DOA: 05/13/2014 PCP: [name], MD   Brief narrative: 73 y.o. 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 Has nausea but no vomiting . No fevers, chills diarrhea but has tremors that started after chemotherapy. In the emergency room she was noted to be severely neutropenic and 2 blood units were ordered for transfusion. Patient complaining of back pain radiating to the shoulders bilaterally.  Assessment and Plan:    Active Problems:  Pancytopenia, severe - secondary to chemo - transfused 2 U PRBC 12/26, will need two more units 12/28 - transfused two U Plt's 12/27 and Plt up this AM - repeat CBC in AM  Lung cancer with metastatic disease - appreciate Dr. Worthy Flank assistance    Hypokalemia - supplement and repeat BMP in AM   Hypocalcemia - will supplement and repeat BMP in AM  Acute pain - provide analgesia as needed  HTN - continue home medical regimen   PCM, moderate  - in the context of acute on chronic illness - advance diet as pt able to tolerate   DVT prophylaxis  SCD's  Code Status: DNR Family Communication: Pt and husband at bedside Disposition Plan: Home when medically stable   IV Access:    Peripheral IV Procedures and diagnostic studies:      Imaging Results (Last 48 hours)    No results found.   Medical Consultants:    None Other Consultants:    Physical therapy  Anti-Infectives:    None  Faye Ramsay, MD  [facility] Pager [phone]  If 7PM-7AM, please contact night-coverage [email or web address] Password [facility] 05/15/2014, 2:56 PM   LOS: 2 days   HPI/Subjective: No events overnight.   Objective: Filed Vitals:   05/14/14 1539 05/14/14 2156  05/15/14 0514 05/15/14 1415  BP: 122/66 128/65 114/60 121/59  Pulse: 93 87 83 88  Temp: 98.5 F (36.9 C) 98.3 F (36.8 C) 98.2 F (36.8 C) 98.2 F (36.8 C)  TempSrc: Oral Oral Oral Oral  Resp: 18 18 18 18   Height:      Weight:      SpO2: 97% 98% 93% 97%    Intake/Output Summary (Last 24 hours) at 05/15/14 1456 Last data filed at 05/15/14 1444  Gross per 24 hour  Intake   1040 ml  Output      2 ml  Net   1038 ml    Exam:   General:  Pt is alert, follows commands appropriately, not in acute distress  Cardiovascular: Regular rate and rhythm, no rubs, no gallops  Respiratory: Clear to auscultation bilaterally, no wheezing, no crackles, no rhonchi  Abdomen: Soft, non tender, non distended, bowel sounds present, no guarding  Extremities: No edema, pulses DP and PT palpable bilaterally   Data Reviewed: Basic Metabolic Panel:  Recent Labs Lab 05/09/14 1222 05/13/14 1605 05/14/14 0820 05/15/14 0346  NA 137 139 136 136  K 3.0* 3.2* 4.2 3.2*  CL  --  106 102 103  CO2 34* 26 29 30   GLUCOSE 103 95 89 86  BUN 23.4 15 19 13   CREATININE 0.7 0.54 0.79 0.81  CALCIUM 7.4* 6.2* 6.1* 6.4*   Liver Function Tests:  Recent Labs Lab 05/09/14 1222 05/13/14 1605  AST 15 15  ALT  11 10  ALKPHOS 271* 232*  BILITOT 1.84* 1.1  PROT 6.5 5.9*  ALBUMIN 3.3* 3.2*   CBC:  Recent Labs Lab 05/09/14 1221 05/13/14 1605 05/14/14 0820 05/15/14 0346  WBC 0.6* 1.4* 2.0* 2.8*  NEUTROABS 0.1* 0.5*  --  1.3*  HGB 8.1* 5.7* 8.0* 7.3*  HCT 24.2* 17.3* 23.0* 21.3*  MCV 84.0 85.2 85.2 85.5  PLT 89* <5* <5* 39*   Recent Results (from the past 240 hour(s))  Culture, blood (routine x 2)     Status: None (Preliminary result)   Collection Time: 05/13/14  6:51 PM  Result Value Ref Range Status   Specimen Description BLOOD RIGHT ANT  Final   Special Requests BOTTLES DRAWN AEROBIC AND ANAEROBIC 5CC  Final   Culture   Final           BLOOD CULTURE RECEIVED NO GROWTH TO DATE CULTURE WILL  BE HELD FOR 5 DAYS BEFORE ISSUING A FINAL NEGATIVE REPORT Performed at [facility]    Report Status PENDING  Incomplete  Culture, blood (routine x 2)     Status: None (Preliminary result)   Collection Time: 05/13/14  6:51 PM  Result Value Ref Range Status   Specimen Description BLOOD RIGHT HAND  Final   Special Requests BOTTLES DRAWN AEROBIC AND ANAEROBIC 5CC  Final   Culture   Final           BLOOD CULTURE RECEIVED NO GROWTH TO DATE CULTURE WILL BE HELD FOR 5 DAYS BEFORE ISSUING A FINAL NEGATIVE REPORT Performed at [facility]    Report Status PENDING  Incomplete  Culture, Urine     Status: None   Collection Time: 05/13/14  7:36 PM  Result Value Ref Range Status   Specimen Description URINE, CLEAN CATCH  Final   Special Requests NONE  Final   Colony Count   Final    40,000 COLONIES/ML Performed at [facility]    Culture   Final    Multiple bacterial morphotypes present, none predominant. Suggest appropriate recollection if clinically indicated. Performed at [facility]    Report Status 05/15/2014 FINAL  Final     Scheduled Meds: . ALPRAZolam  0.5 mg Oral QHS  . atorvastatin  80 mg Oral q morning - 10a  . buPROPion  150 mg Oral q morning - 10a  . feeding supplement (ENSURE COMPLETE)  237 mL Oral BID BM  . hydrochlorothiazide  12.5 mg Oral Daily  . LORazepam  0.5 mg Oral 3 times per day  . metoprolol succinate  50 mg Oral q morning - 10a  . pantoprazole  40 mg Oral Daily  . potassium chloride SA  20 mEq Oral Daily   Continuous Infusions: . sodium chloride 75 mL/hr at 05/15/14 [phone]

## 2014-05-16 ENCOUNTER — Ambulatory Visit: Payer: Medicare Other | Admitting: Neurology

## 2014-05-16 ENCOUNTER — Other Ambulatory Visit: Payer: Medicare Other

## 2014-05-16 LAB — TYPE AND SCREEN
ABO/RH(D): O POS
Antibody Screen: NEGATIVE
Unit division: 0
Unit division: 0
Unit division: 0
Unit division: 0

## 2014-05-16 LAB — CBC
HCT: 30.7 % — ABNORMAL LOW (ref 36.0–46.0)
HEMOGLOBIN: 10.3 g/dL — AB (ref 12.0–15.0)
MCH: 29.6 pg (ref 26.0–34.0)
MCHC: 33.6 g/dL (ref 30.0–36.0)
MCV: 88.2 fL (ref 78.0–100.0)
PLATELETS: 37 10*3/uL — AB (ref 150–400)
RBC: 3.48 MIL/uL — AB (ref 3.87–5.11)
RDW: 15.6 % — ABNORMAL HIGH (ref 11.5–15.5)
WBC: 6.9 10*3/uL (ref 4.0–10.5)

## 2014-05-16 LAB — BASIC METABOLIC PANEL
Anion gap: 6 (ref 5–15)
BUN: 7 mg/dL (ref 6–23)
CO2: 29 mmol/L (ref 19–32)
Calcium: 7.3 mg/dL — ABNORMAL LOW (ref 8.4–10.5)
Chloride: 102 mEq/L (ref 96–112)
Creatinine, Ser: 0.79 mg/dL (ref 0.50–1.10)
GFR calc Af Amer: >90 mL/min (ref >90)
GFR, EST NON AFRICAN AMERICAN: 81 mL/min — AB (ref >90)
GLUCOSE: 88 mg/dL (ref 70–99)
POTASSIUM: 4 mmol/L (ref 3.5–5.1)
SODIUM: 137 mmol/L (ref 135–145)

## 2014-05-16 MED ORDER — SODIUM CHLORIDE 0.9 % IV SOLN
1.0000 g | Freq: Once | INTRAVENOUS | Status: AC
  Administered 2014-05-16: 1 g via INTRAVENOUS
  Filled 2014-05-16: qty 10

## 2014-05-16 NOTE — Progress Notes (Signed)
Patient ID: [name], female   DOB: 1941-01-21, 73 y.o.   MRN: [id number]  [facility] PROGRESS NOTE  [name] [id number] DOB: 1941-03-15 DOA: 05/13/2014 PCP: [name], MD   Brief narrative: 73 y.o. 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 Has nausea but no vomiting . No fevers, chills diarrhea but has tremors that started after chemotherapy. In the emergency room she was noted to be severely neutropenic and 2 blood units were ordered for transfusion. Patient complaining of back pain radiating to the shoulders bilaterally.  Assessment and Plan:    Active Problems:  Pancytopenia, severe - secondary to chemo - transfused 2 U PRBC 12/26, two more units 12/28 - transfused two U Plt's 12/27  - blood counts stable this AM, non  - repeat CBC in AM  Lung cancer with metastatic disease - appreciate Dr. Worthy Flank assistance   Hypokalemia - supplemented and WNL this AM  Hypocalcemia - improving   Acute pain, generalized - resolved  - provide analgesia as needed  HTN - continue home medical regimen   PCM, moderate  - in the context of acute on chronic illness - advance diet as pt able to tolerate   DVT prophylaxis  SCD's  Code Status: DNR Family Communication: Pt and husband at bedside Disposition Plan: Home in AM   IV Access:    Peripheral IV Procedures and diagnostic studies:      Imaging Results (Last 48 hours)    No results found.   Medical Consultants:    None Other Consultants:    Physical therapy  Anti-Infectives:    None   Faye Ramsay, MD  [facility] Pager [phone]  If 7PM-7AM, please contact night-coverage [email or web address] Password [facility] 05/16/2014, 8:58 AM   LOS: 3 days   HPI/Subjective: No events overnight.   Objective: Filed Vitals:   05/15/14 2302 05/15/14 2330  05/16/14 0230 05/16/14 0605  BP: 132/71 132/60 130/58 136/66  Pulse: 86 89 80 79  Temp: 98.4 F (36.9 C) 98 F (36.7 C) 98.1 F (36.7 C) 98.6 F (37 C)  TempSrc: Oral Oral Oral Oral  Resp: 16 18 16 16   Height:      Weight:      SpO2: 99% 93% 94% 99%    Intake/Output Summary (Last 24 hours) at 05/16/14 0858 Last data filed at 05/16/14 0846  Gross per 24 hour  Intake   1740 ml  Output      2 ml  Net   1738 ml    Exam:   General:  Pt is alert, follows commands appropriately, not in acute distress  Cardiovascular: Regular rate and rhythm, S1/S2, no murmurs, no rubs, no gallops  Respiratory: Clear to auscultation bilaterally, no wheezing, no crackles, no rhonchi  Abdomen: Soft, non tender, non distended, bowel sounds present, no guarding   Data Reviewed: Basic Metabolic Panel:  Recent Labs Lab 05/09/14 1222 05/13/14 1605 05/14/14 0820 05/15/14 0346 05/16/14 0540  NA 137 139 136 136 137  K 3.0* 3.2* 4.2 3.2* 4.0  CL  --  106 102 103 102  CO2 34* 26 29 30 29   GLUCOSE 103 95 89 86 88  BUN 23.4 15 19 13 7   CREATININE 0.7 0.54 0.79 0.81 0.79  CALCIUM 7.4* 6.2* 6.1* 6.4* 7.3*   Liver Function Tests:  Recent Labs Lab 05/09/14 1222 05/13/14 1605  AST 15 15  ALT 11 10  ALKPHOS 271* 232*  BILITOT 1.84* 1.1  PROT 6.5 5.9*  ALBUMIN 3.3* 3.2*   CBC:  Recent Labs Lab 05/09/14 1221 05/13/14 1605 05/14/14 0820 05/15/14 0346 05/16/14 0540  WBC 0.6* 1.4* 2.0* 2.8* 6.9  NEUTROABS 0.1* 0.5*  --  1.3*  --   HGB 8.1* 5.7* 8.0* 7.3* 10.3*  HCT 24.2* 17.3* 23.0* 21.3* 30.7*  MCV 84.0 85.2 85.2 85.5 88.2  PLT 89* <5* <5* 39* 37*     Recent Results (from the past 240 hour(s))  Culture, blood (routine x 2)     Status: None (Preliminary result)   Collection Time: 05/13/14  6:51 PM  Result Value Ref Range Status   Specimen Description BLOOD RIGHT ANT  Final   Special Requests BOTTLES DRAWN AEROBIC AND ANAEROBIC 5CC  Final   Culture   Final           BLOOD  CULTURE RECEIVED NO GROWTH TO DATE CULTURE WILL BE HELD FOR 5 DAYS BEFORE ISSUING A FINAL NEGATIVE REPORT Performed at [facility]    Report Status PENDING  Incomplete  Culture, blood (routine x 2)     Status: None (Preliminary result)   Collection Time: 05/13/14  6:51 PM  Result Value Ref Range Status   Specimen Description BLOOD RIGHT HAND  Final   Special Requests BOTTLES DRAWN AEROBIC AND ANAEROBIC 5CC  Final   Culture   Final           BLOOD CULTURE RECEIVED NO GROWTH TO DATE CULTURE WILL BE HELD FOR 5 DAYS BEFORE ISSUING A FINAL NEGATIVE REPORT Performed at [facility]    Report Status PENDING  Incomplete  Culture, Urine     Status: None   Collection Time: 05/13/14  7:36 PM  Result Value Ref Range Status   Specimen Description URINE, CLEAN CATCH  Final   Special Requests NONE  Final   Colony Count   Final    40,000 COLONIES/ML Performed at [facility]    Culture   Final    Multiple bacterial morphotypes present, none predominant. Suggest appropriate recollection if clinically indicated. Performed at [facility]    Report Status 05/15/2014 FINAL  Final     Scheduled Meds: . sodium chloride   Intravenous Once  . ALPRAZolam  0.5 mg Oral QHS  . atorvastatin  80 mg Oral q morning - 10a  . buPROPion  150 mg Oral q morning - 10a  . feeding supplement (ENSURE COMPLETE)  237 mL Oral BID BM  . hydrochlorothiazide  12.5 mg Oral Daily  . LORazepam  0.5 mg Oral 3 times per day  . metoprolol succinate  50 mg Oral q morning - 10a  . pantoprazole  40 mg Oral Daily  . potassium chloride SA  20 mEq Oral Daily   Continuous Infusions:

## 2014-05-16 NOTE — Progress Notes (Signed)
CARE MANAGEMENT NOTE 05/16/2014  Patient:  [name], [name]   Account Number:  [id number]  Date Initiated:  05/16/2014  Documentation initiated by:  Edwyna Shell  Subjective/Objective Assessment:   73 yo female admitted with pancytopenia from home with spouse     Action/Plan:   discharge planning   Anticipated DC Date:  05/18/2014   Anticipated DC Plan:  [place]  CM consult      Choice offered to / List presented to:             Status of service:  In process, will continue to follow Medicare Important Message given?  YES (If response is "NO", the following Medicare IM given date fields will be blank) Date Medicare IM given:  05/16/2014 Medicare IM given by:  Edwyna Shell Date Additional Medicare IM given:   Additional Medicare IM given by:    Discharge Disposition:    Per UR Regulation:    If discussed at Long Length of Stay Meetings, dates discussed:    Comments:  05/16/14 Edwyna Shell RN BSN CM [phone] Patient stated that she has a walker and cane at home and uses her cane, she has a PCP and pharmacy and her husbands transports her to appointments. She has had [place] services with [facility] in the past and if needed would like to remain with them for any HH needs. Will continue to follow

## 2014-05-16 NOTE — Evaluation (Signed)
Physical Therapy Evaluation Patient Details Name: [name] MRN: [id number] DOB: Sep 30, 1940 Today's Date: 05/16/2014   History of Present Illness  73 y.o. 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, knee surgeries;  Clinical Impression  Pt will benefit from PT to address deficits below; Will follow while in acute setting; Has good home support, no f/u recommended at this time    Follow Up Recommendations No PT follow up    Equipment Recommendations  None recommended by PT    Recommendations for Other Services       Precautions / Restrictions Precautions Precautions: Fall      Mobility  Bed Mobility Overal bed mobility: Needs Assistance Bed Mobility: Sit to Supine       Sit to supine: Modified independent (Device/Increase time)      Transfers Overall transfer level: Needs assistance Equipment used: Rolling walker (2 wheeled) Transfers: Sit to/from Stand Sit to Stand: Min guard         General transfer comment: cues for safety  Ambulation/Gait Ambulation/Gait assistance: Supervision Ambulation Distance (Feet): 60 Feet (and 10' more) Assistive device: Straight cane Gait Pattern/deviations: Step-through pattern;Drifts right/left     General Gait Details: cues for posture safety  Stairs            Wheelchair Mobility    Modified Rankin (Stroke Patients Only)       Balance Overall balance assessment: Needs assistance Sitting-balance support: Feet supported;No upper extremity supported Sitting balance-Leahy Scale: Fair     Standing balance support: No upper extremity supported;During functional activity;Single extremity supported Standing balance-Leahy Scale: Good               High level balance activites: Direction changes;Turns;Head turns High Level Balance Comments: min/guard for balance and safety, pt attempts to touch rail in hallway but self corrects from continuing to use  it; pt reports she has not had any falls at home; she can tolerate min perturbations with close guarding for safety             Pertinent Vitals/Pain Pain Assessment: No/denies pain    Home Living Family/patient expects to be discharged to:: Private residence Living Arrangements: Spouse/significant other Available Help at Discharge: Available PRN/intermittently Type of Home: House Home Access: Stairs to enter   [employer] of Steps: 3-4 Home Layout: One level Home Equipment: Walker - 2 wheels;Cane - single point;Shower seat;Bedside commode      Prior Function Level of Independence: Independent with assistive device(s)               Hand Dominance        Extremity/Trunk Assessment   Upper Extremity Assessment: Generalized weakness;Defer to OT evaluation           Lower Extremity Assessment: Generalized weakness      Cervical / Trunk Assessment: Kyphotic  Communication   Communication: No difficulties  Cognition Arousal/Alertness: Awake/alert Behavior During Therapy: WFL for tasks assessed/performed Overall Cognitive Status: Within Functional Limits for tasks assessed                      General Comments General comments (skin integrity, edema, etc.): pt with UE and LE tremors which seem to worsen today with fatigue; pt states she was supposed to se her neurologist today to be assessed/possible meds adjusted     Exercises General Exercises - Lower Extremity Long Arc Quad: AROM;Both;10 reps Other Exercises Other Exercises: scapular retraction x  10      Assessment/Plan    PT Assessment Patient needs continued PT services  PT Diagnosis Difficulty walking   PT Problem List Decreased activity tolerance;Decreased balance;Decreased mobility;Decreased coordination  PT Treatment Interventions DME instruction;Gait training;Functional mobility training;Therapeutic activities;Stair training;Balance training   PT Goals (Current goals can  be found in the Care Plan section) Acute Rehab PT Goals Patient Stated Goal: to go home, be able to move around more PT Goal Formulation: With patient/family Time For Goal Achievement: 05/23/14 Potential to Achieve Goals: Good    Frequency Min 3X/week   Barriers to discharge        Co-evaluation               End of Session Equipment Utilized During Treatment: Gait belt Activity Tolerance: Patient tolerated treatment well Patient left: in bed;with call bell/phone within reach;with family/visitor present           Time: 4235-3614 PT Time Calculation (min) (ACUTE ONLY): 19 min   Charges:   PT Evaluation $Initial PT Evaluation Tier I: 1 Procedure PT Treatments $Gait Training: 8-22 mins   PT G Codes:        Jearld Hemp May 18, 2014, 12:55 PM

## 2014-05-17 DIAGNOSIS — E43 Unspecified severe protein-calorie malnutrition: Secondary | ICD-10-CM

## 2014-05-17 LAB — BASIC METABOLIC PANEL
ANION GAP: 9 (ref 5–15)
BUN: 6 mg/dL (ref 6–23)
CHLORIDE: 95 meq/L — AB (ref 96–112)
CO2: 32 mmol/L (ref 19–32)
Calcium: 7.7 mg/dL — ABNORMAL LOW (ref 8.4–10.5)
Creatinine, Ser: 0.78 mg/dL (ref 0.50–1.10)
GFR calc non Af Amer: 82 mL/min — ABNORMAL LOW (ref >90)
Glucose, Bld: 96 mg/dL (ref 70–99)
POTASSIUM: 3.4 mmol/L — AB (ref 3.5–5.1)
Sodium: 136 mmol/L (ref 135–145)

## 2014-05-17 LAB — CBC
HEMATOCRIT: 31.4 % — AB (ref 36.0–46.0)
Hemoglobin: 10.6 g/dL — ABNORMAL LOW (ref 12.0–15.0)
MCH: 29.7 pg (ref 26.0–34.0)
MCHC: 33.8 g/dL (ref 30.0–36.0)
MCV: 88 fL (ref 78.0–100.0)
PLATELETS: 38 10*3/uL — AB (ref 150–400)
RBC: 3.57 MIL/uL — AB (ref 3.87–5.11)
RDW: 15.8 % — AB (ref 11.5–15.5)
WBC: 8.1 10*3/uL (ref 4.0–10.5)

## 2014-05-17 MED ORDER — HEPARIN SOD (PORK) LOCK FLUSH 100 UNIT/ML IV SOLN
500.0000 [IU] | INTRAVENOUS | Status: AC | PRN
Start: 2014-05-17 — End: 2014-05-17
  Administered 2014-05-17: 500 [IU]

## 2014-05-17 MED ORDER — ENSURE COMPLETE PO LIQD: 237.0000 mL | Freq: Two times a day (BID) | ORAL | Status: AC

## 2014-05-17 NOTE — Discharge Instructions (Signed)
Pancytopenia  Pancytopenia occurs when the soft material that makes up the hollow insides of your bones (bone marrow) stops making enough blood cells. There are three types of blood cells:   Red blood cells. These carry oxygen to the tissues of the body.   White blood cells. These fight infection.   Platelets. These help your blood clot when you have an injury.  Aplastic anemia is a rare and serious condition that may develop slowly or rapidly. Even after successful treatment, those with aplastic anemia must be monitored for possible recurrent problems.  CAUSES  Anything that hurts or injures your bone marrow can cause aplastic anemia. Things that may injure marrow include:   Radiation and chemotherapy treatment for cancer. These treatments are used to kill cancer cells but also damage other cells.   Exposure to toxic chemicals used in some pesticides and insecticides.   Some medicines, such as those used to treat rheumatoid arthritis.   Autoimmune disorders in which your immune system begins attacking your own body cells.   Viral infections, including hepatitis.   Pregnancy.  Sometimes the cause is not known.  SYMPTOMS   Shortness of breath.   Fatigue.   Lightheadedness or fainting.   Shortness of breath and rapid heart rate, especially with exertion.   Pale skin and lips.   Frequent infections.   Easy bruising and bleeding.   Nosebleeds and bleeding gums.   Prolonged bleeding from cuts.   Severe bleeding during menstrual periods in women.  Sore mouth.   Bacterial or fungal infections. DIAGNOSIS  Blood tests and a bone marrow biopsy are used to diagnose the condition. Additional testing may be done to find the underlying cause of the anemia.  TREATMENT  For mild cases, observation is needed. In severe cases or if complications develop, hospitalization may be necessary. Severe aplastic anemia is life threatening. Treatment may include:   Blood  transfusions.   Medicines. Medicines may be given to suppress the immune system if you have an autoimmune disorder. They may also be given to stimulate marrow to make more blood cells.  A procedure where healthy marrow from a donor is given to the person with aplastic anemia (bone marrow transplant). A bone marrow transplant is used to treat severe aplastic anemia. After the transplant, drugs are needed to help prevent the body from rejecting the new marrow. If the procedure is successful, the healthy marrow will begin producing new blood cells. Bone marrow transplants carry risk, and not everyone is a candidate for transplantation. If the body rejects the transplant, it can be life threatening. HOME CARE INSTRUCTIONS   Get plenty of rest and eat a well-balanced diet.   Avoid excessive exercise. Long-term anemia can stress the heart.   When platelets are at low levels, avoid all activities that risk injury. This is important because the risk of bleeding is greater.   Only take over-the-counter or prescription medicines for pain, fever, or discomfort as directed by your health care provider.   Take antibiotics as directed by your health care provider. Make sure you finish them even if you start to feel better.   Protect yourself from infections by washing your hands often. Avoid crowds. Avoid being around sick people.   Keep all follow-up appointments.  To prevent aplastic anemia from returning, avoid exposure to toxic chemicals such as:   Insecticides.   Herbicides.   Organic solvents.   Paint removers. SEEK IMMEDIATE MEDICAL CARE IF:  You have a fever or persistent  symptoms for more than 2-3 days.   You have a fever and your symptoms suddenly get worse.   You develop flu-like symptoms.   You develop signs of infection.   You develop infections more frequently.   You have blood in your urine or bowel movements.   You are bruising easily.   You are  bleeding from your gums or nose.   You have prolonged bleeding from cuts.   You have increasing shortness of breath or chest pain with exertion.   You develop a rapid heart rate with exertion.   You have increasing fatigue and tiredness.   You develop lightheadedness or fainting.   You develop pale skin and lips.   You develop a sore mouth. MAKE SURE YOU:   Understand these instructions.  Will watch your condition.  Will get help right away if you are not doing well or get worse. Document Released: 03/02/2007 Document Revised: 05/10/2013 Document Reviewed: 10/29/2012 [facility] Patient Information 2015 [place], [place]. This information is not intended to replace advice given to you by your health care provider. Make sure you discuss any questions you have with your health care provider.

## 2014-05-17 NOTE — Discharge Summary (Signed)
Physician Discharge Summary  [name] MWN:[id number] DOB: [birth year] DOA: 05/13/2014  PCP: [name], MD  Admit date: 05/13/2014 Discharge date: 05/17/2014  Recommendations for Outpatient Follow-up:  Follow up in cancer center per scheduled appointment, 01/05 at 11:15 am for labs and then infusion  Discharge Diagnoses:  Active Problems:   Pancytopenia   Neutropenia   Lung cancer   Acute pain   Protein-calorie malnutrition, severe    Discharge Condition: stable   Diet recommendation: as tolerated   History of present illness:  73 y.o. 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 Has nausea but no vomiting . No fevers, chills diarrhea but has tremors that started after chemotherapy. In the emergency room she was noted to be severely neutropenic and 2 blood units were ordered for transfusion. Patient complaining of back pain radiating to the shoulders bilaterally.  Assessment and Plan:    Active Problems:  Pancytopenia, severe - secondary to malignancy and sequela of chemotherapy - transfused 2 U PRBC 12/26, two more units 12/28 - transfused two U Plt's 12/27  - blood counts stable  - will be recheck in cancer center during next scheduled appt    Lung cancer with metastatic disease - appreciate Dr. Worthy Flank assistance    Hypokalemia - supplemented and WNL   Hypocalcemia - improving    Acute pain, generalized - resolved    HTN - continue home medical regimen    PCM, moderate  - in the context of acute on chronic illness - advanced diet as pt able to tolerate   DVT prophylaxis  SCD's bilaterally   Code Status: DNR Family Communication: Pt and husband at bedside    IV Access:    Peripheral IV Medical Consultants:    None Other Consultants:    Physical therapy  Anti-Infectives:     None   Signed:  Leisa Lenz, MD  [facility] 05/17/2014, 8:44 AM  Pager #: [phone]   Discharge Exam: Filed Vitals:   05/17/14 0527  BP: 132/53  Pulse: 87  Temp: 98.6 F (37 C)  Resp: 16   Filed Vitals:   05/16/14 1423 05/16/14 2058 05/16/14 2105 05/17/14 0527  BP: 143/70 152/80 128/67 132/53  Pulse: 88 91 79 87  Temp: 97.6 F (36.4 C) 98.1 F (36.7 C) 98.1 F (36.7 C) 98.6 F (37 C)  TempSrc: Oral Oral Oral Oral  Resp: 18 16 18 16   Height:      Weight:      SpO2: 95% 95% 97% 94%    General: Pt is alert, follows commands appropriately, not in acute distress Cardiovascular: Regular rate and rhythm, S1/S2 + Respiratory: no wheezing, no crackles, no rhonchi Abdominal: Soft, non tender, non distended, bowel sounds +, no guarding Extremities: no edema, no cyanosis, pulses palpable bilaterally DP and PT Neuro: Grossly nonfocal  Discharge Instructions  Discharge Instructions    Call MD for:  difficulty breathing, headache or visual disturbances    Complete by:  As directed      Call MD for:  persistant nausea and vomiting    Complete by:  As directed      Call MD for:  severe uncontrolled pain    Complete by:  As directed      Diet - low sodium heart healthy    Complete by:  As directed      Discharge instructions  Complete by:  As directed   Follow up in cancer center per scheduled appointment, 01/05 at 11:15 am for labs and then infusion     Increase activity slowly    Complete by:  As directed             Medication List    TAKE these medications        ALPRAZolam 0.5 MG tablet  Commonly known as:  XANAX  Take 0.5 mg by mouth at bedtime.     atorvastatin 80 MG tablet  Commonly known as:  LIPITOR  Take 80 mg by mouth every morning.     buPROPion 150 MG 12 hr tablet  Commonly known as:  WELLBUTRIN SR  Take 150 mg by mouth every morning.     CALCIUM PO  Take 1 tablet by mouth daily.     dexlansoprazole 60 MG capsule   Commonly known as:  DEXILANT  TAKE 1 CAPSULE (60 MG TOTAL) BY MOUTH DAILY.     feeding supplement (ENSURE COMPLETE) Liqd  Take 237 mLs by mouth 2 (two) times daily between meals.     glycopyrrolate 2 MG tablet  Commonly known as:  ROBINUL  TAKE 1 TABLET (2 MG TOTAL) BY MOUTH 2 (TWO) TIMES DAILY.     hydrochlorothiazide 12.5 MG tablet  Commonly known as:  HYDRODIURIL  Take 1 tablet by mouth every morning.     lidocaine-prilocaine cream  Commonly known as:  EMLA  Apply 1 application topically as needed. Apply to port 1 hr before chemo     LORazepam 0.5 MG tablet  Commonly known as:  ATIVAN  Take 1 tablet (0.5 mg total) by mouth every 8 (eight) hours.     metoprolol succinate 50 MG 24 hr tablet  Commonly known as:  TOPROL-XL  Take 50 mg by mouth every morning.     MIRALAX PO  Take 1 scoop by mouth daily as needed (for constipation).     oxycodone 5 MG capsule  Commonly known as:  OXY-IR  1-2 every 4 hours as needed for back pain     potassium chloride SA 20 MEQ tablet  Commonly known as:  K-DUR,KLOR-CON  Take 1 tablet (20 mEq total) by mouth daily.     prochlorperazine 10 MG tablet  Commonly known as:  COMPAZINE  Take 1 tablet (10 mg total) by mouth every 6 (six) hours as needed for nausea or vomiting.     ZORVOLEX 35 MG Caps  Generic drug:  Diclofenac  Take 35 mg by mouth every morning.           Follow-up Information    Follow up with Carlton Adam, PA-C On 04/23/2015.   Specialty:  Physician Assistant   Why:  at 11:15 am; labs and infusion    Contact information:   [place] [address] [phone]        The results of significant diagnostics from this hospitalization (including imaging, microbiology, ancillary and laboratory) are listed below for reference.    Significant Diagnostic Studies: Ct Chest W Contrast  05/01/2014   CLINICAL DATA:  Subsequent treatment strategy for squamous cell carcinoma with bone metastasis.  EXAM: CT  CHEST, ABDOMEN, AND PELVIS WITH CONTRAST  TECHNIQUE: Multidetector CT imaging of the chest, abdomen and pelvis was performed following the standard protocol during bolus administration of intravenous contrast.  CONTRAST:  129mL OMNIPAQUE IOHEXOL 300 MG/ML  SOLN  COMPARISON:  02/20/2014  FINDINGS: CT CHEST FINDINGS  Mediastinum:  The heart size appears normal. No pericardial effusion identified. Calcified atherosclerotic disease involves the thoracic aorta. Calcification within the RCA Coronary artery also noted. Index right paratracheal lymph node measures 1.5 cm, image 24/ series 2. Previously 2.5 cm. Sub- carinal lymph node measures 1.5 cm, image 30/ series 2. Previously 2.8 cm. The right hilar lymph node measures 1.9 cm, image 28/ series 2. Previously 3.1 cm.  Lungs/Pleura: There is no pleural effusion identified. Moderate changes of centrilobular and paraseptal emphysema are noted in both upper and lower lobes. Bilateral pulmonary nodules are identified compatible with metastatic disease. Index nodule in the left lower lobe measures 9 mm, image 46/ series 4. This is new from the previous exam. Also new from the prior exam is a 9 mm left lower lobe nodule, image 42/series 4.Improvement in pleural nodularity overlying the right lung. The index lesion within the posterior medial right lower lobe measures 1.1 x 0.5 cm, image 32/series 4. Previously 2.5 x 1.1 cm.  Musculoskeletal: Review of the visualized osseous structures shows multi focal areas of bone metastases involving the thoracic and lumbar spine. Pathologic fracture involving the T9 vertebra has progressed from the previous exam. There is a new lucent lesion involving the posterior aspect of the T8 vertebra measuring 1.2 cm, image 61 of 603. Mixed lytic and sclerotic bone lesion involving T12 appears stable.  CT ABDOMEN AND PELVIS FINDINGS  Hepatobiliary: There is no suspicious liver abnormality identified. Previous cholecystectomy.  Pancreas: Normal  appearance of the pancreas.  Spleen: The spleen appears normal.  Adrenals/Urinary Tract: 7 mm right adrenal nodule is unchanged. The 1.1 cm right adrenal nodule is also stable. Normal appearance of the left adrenal gland. Bilateral renal cysts are identified. The urinary bladder appears normal.  Stomach/Bowel: The stomach appears normal. The small bowel loops have a normal course and caliber without obstruction. Normal appearance of the appendix. Multiple colonic diverticula identified without acute inflammation.  Vascular/Lymphatic: Calcified atherosclerotic disease involves the abdominal aorta. There is no aneurysm. The aorta has a maximum AP dimension of 2.4 cm. No enlarged retroperitoneal or mesenteric adenopathy. No enlarged pelvic or inguinal lymph nodes.  Reproductive: Previous hysterectomy.  There is no adnexal mass.  Other: There is no ascites or focal fluid collections within the abdomen or pelvis.  Musculoskeletal: Multi focal bone metastases are again identified. The patient is status post posterior hardware fixation of the lower lumbar spine. There is a lytic lesion involving the right iliac wing measuring 1.6 cm, image 88/ series 2. Previously 1.3 cm. 2.8 cm sclerotic lesion within the posterior right iliac bone is identified. Unchanged from previous study.  IMPRESSION: 1. No acute findings within the chest abdomen or pelvis. 2. Mixed interval response to therapy. There has been decrease in size of right hilar mass and mediastinal lymph nodes. Additionally, pleural nodularity overlying the right lung has decreased in the interval. 3. There are several new pulmonary nodules in the left lower lobe, worrisome for metastatic disease. 4. Multi focal bone metastasis are again noted. These appear stable to slightly progressed from previous exam. Specifically, there is a lesion in the right iliac wing which is slightly increased in the interval. There is also a lesion involving T8 which appears new from  03/02/2014. Progression of pathologic T9 fracture.   Electronically Signed   By: Kerby Moors M.D.   On: 05/01/2014 15:10   Ct Abdomen Pelvis W Contrast  05/01/2014   CLINICAL DATA:  Subsequent treatment strategy for squamous cell carcinoma with bone metastasis.  EXAM: CT CHEST, ABDOMEN, AND PELVIS WITH CONTRAST  TECHNIQUE: Multidetector CT imaging of the chest, abdomen and pelvis was performed following the standard protocol during bolus administration of intravenous contrast.  CONTRAST:  175mL OMNIPAQUE IOHEXOL 300 MG/ML  SOLN  COMPARISON:  02/20/2014  FINDINGS: CT CHEST FINDINGS  Mediastinum: The heart size appears normal. No pericardial effusion identified. Calcified atherosclerotic disease involves the thoracic aorta. Calcification within the RCA Coronary artery also noted. Index right paratracheal lymph node measures 1.5 cm, image 24/ series 2. Previously 2.5 cm. Sub- carinal lymph node measures 1.5 cm, image 30/ series 2. Previously 2.8 cm. The right hilar lymph node measures 1.9 cm, image 28/ series 2. Previously 3.1 cm.  Lungs/Pleura: There is no pleural effusion identified. Moderate changes of centrilobular and paraseptal emphysema are noted in both upper and lower lobes. Bilateral pulmonary nodules are identified compatible with metastatic disease. Index nodule in the left lower lobe measures 9 mm, image 46/ series 4. This is new from the previous exam. Also new from the prior exam is a 9 mm left lower lobe nodule, image 42/series 4.Improvement in pleural nodularity overlying the right lung. The index lesion within the posterior medial right lower lobe measures 1.1 x 0.5 cm, image 32/series 4. Previously 2.5 x 1.1 cm.  Musculoskeletal: Review of the visualized osseous structures shows multi focal areas of bone metastases involving the thoracic and lumbar spine. Pathologic fracture involving the T9 vertebra has progressed from the previous exam. There is a new lucent lesion involving the posterior  aspect of the T8 vertebra measuring 1.2 cm, image 61 of 603. Mixed lytic and sclerotic bone lesion involving T12 appears stable.  CT ABDOMEN AND PELVIS FINDINGS  Hepatobiliary: There is no suspicious liver abnormality identified. Previous cholecystectomy.  Pancreas: Normal appearance of the pancreas.  Spleen: The spleen appears normal.  Adrenals/Urinary Tract: 7 mm right adrenal nodule is unchanged. The 1.1 cm right adrenal nodule is also stable. Normal appearance of the left adrenal gland. Bilateral renal cysts are identified. The urinary bladder appears normal.  Stomach/Bowel: The stomach appears normal. The small bowel loops have a normal course and caliber without obstruction. Normal appearance of the appendix. Multiple colonic diverticula identified without acute inflammation.  Vascular/Lymphatic: Calcified atherosclerotic disease involves the abdominal aorta. There is no aneurysm. The aorta has a maximum AP dimension of 2.4 cm. No enlarged retroperitoneal or mesenteric adenopathy. No enlarged pelvic or inguinal lymph nodes.  Reproductive: Previous hysterectomy.  There is no adnexal mass.  Other: There is no ascites or focal fluid collections within the abdomen or pelvis.  Musculoskeletal: Multi focal bone metastases are again identified. The patient is status post posterior hardware fixation of the lower lumbar spine. There is a lytic lesion involving the right iliac wing measuring 1.6 cm, image 88/ series 2. Previously 1.3 cm. 2.8 cm sclerotic lesion within the posterior right iliac bone is identified. Unchanged from previous study.  IMPRESSION: 1. No acute findings within the chest abdomen or pelvis. 2. Mixed interval response to therapy. There has been decrease in size of right hilar mass and mediastinal lymph nodes. Additionally, pleural nodularity overlying the right lung has decreased in the interval. 3. There are several new pulmonary nodules in the left lower lobe, worrisome for metastatic disease. 4.  Multi focal bone metastasis are again noted. These appear stable to slightly progressed from previous exam. Specifically, there is a lesion in the right iliac wing which is slightly increased in the interval. There is also a lesion  involving T8 which appears new from 03/02/2014. Progression of pathologic T9 fracture.   Electronically Signed   By: Kerby Moors M.D.   On: 05/01/2014 15:10   Ir Fluoro Guide Cv Line Right  04/27/2014   CLINICAL DATA:  73 year old with small cell carcinoma of the lung. Port-A-Cath needed for chemotherapy.  EXAM: FLUOROSCOPIC AND ULTRASOUND GUIDED PLACEMENT OF A SUBCUTANEOUS PORT.  Physician: Stephan Minister. Henn, MD  FLUOROSCOPY TIME:  18 seconds, 2 mGy  MEDICATIONS AND MEDICAL HISTORY: Vancomycin 1 g, 2 mg versed, 100 mcg fentanyl. Vancomycin was given within two hours of incision. Vancomycin was given due to a cephalosporin allergy. A radiology nurse monitored the patient for moderate sedation.  ANESTHESIA/SEDATION: Moderate sedation time: 40 minutes  PROCEDURE: The risks of the procedure were explained to the patient. Informed consent was obtained. Patient was placed supine on the interventional table. Ultrasound confirmed a patent right internal jugular vein. The right chest and neck were cleaned with a skin antiseptic and a sterile drape was placed. Maximal barrier sterile technique was utilized including caps, mask, sterile gowns, sterile gloves, sterile drape, hand hygiene and skin antiseptic. The right neck was anesthetized with 1% lidocaine. Small incision was made in the right neck with a blade. Micropuncture set was placed in the right internal jugular vein with ultrasound guidance. The micropuncture wire was used for measurement purposes. The right chest was anesthetized with 1% lidocaine with epinephrine. #15 blade was used to make an incision and a subcutaneous port pocket was formed. [place] was assembled. Subcutaneous tunnel was formed with a stiff tunneling  device. The port catheter was brought through the subcutaneous tunnel. The port was placed in the subcutaneous pocket. The micropuncture set was exchanged for a peel-away sheath. The catheter was placed through the peel-away sheath and the tip was positioned at the superior cavoatrial junction. Catheter placement was confirmed with fluoroscopy. The port was accessed and flushed with heparinized saline. The port pocket was closed using two layers of absorbable sutures and Dermabond. The vein skin site was closed using a single layer of absorbable suture and Dermabond. Sterile dressings were applied. Patient tolerated the procedure well without an immediate complication. Ultrasound and fluoroscopic images were taken and saved for this procedure.  COMPLICATIONS: None  IMPRESSION: Placement of a subcutaneous port device. The catheter tip at the superior cavoatrial junction and ready to be used.   Electronically Signed   By: Markus Daft M.D.   On: 04/27/2014 19:12   Ir US Guide Vasc Access Right  04/27/2014   CLINICAL DATA:  73 year old with small cell carcinoma of the lung. Port-A-Cath needed for chemotherapy.  EXAM: FLUOROSCOPIC AND ULTRASOUND GUIDED PLACEMENT OF A SUBCUTANEOUS PORT.  Physician: Stephan Minister. Henn, MD  FLUOROSCOPY TIME:  18 seconds, 2 mGy  MEDICATIONS AND MEDICAL HISTORY: Vancomycin 1 g, 2 mg versed, 100 mcg fentanyl. Vancomycin was given within two hours of incision. Vancomycin was given due to a cephalosporin allergy. A radiology nurse monitored the patient for moderate sedation.  ANESTHESIA/SEDATION: Moderate sedation time: 40 minutes  PROCEDURE: The risks of the procedure were explained to the patient. Informed consent was obtained. Patient was placed supine on the interventional table. Ultrasound confirmed a patent right internal jugular vein. The right chest and neck were cleaned with a skin antiseptic and a sterile drape was placed. Maximal barrier sterile technique was utilized including caps,  mask, sterile gowns, sterile gloves, sterile drape, hand hygiene and skin antiseptic. The right neck was anesthetized  with 1% lidocaine. Small incision was made in the right neck with a blade. Micropuncture set was placed in the right internal jugular vein with ultrasound guidance. The micropuncture wire was used for measurement purposes. The right chest was anesthetized with 1% lidocaine with epinephrine. #15 blade was used to make an incision and a subcutaneous port pocket was formed. [place] was assembled. Subcutaneous tunnel was formed with a stiff tunneling device. The port catheter was brought through the subcutaneous tunnel. The port was placed in the subcutaneous pocket. The micropuncture set was exchanged for a peel-away sheath. The catheter was placed through the peel-away sheath and the tip was positioned at the superior cavoatrial junction. Catheter placement was confirmed with fluoroscopy. The port was accessed and flushed with heparinized saline. The port pocket was closed using two layers of absorbable sutures and Dermabond. The vein skin site was closed using a single layer of absorbable suture and Dermabond. Sterile dressings were applied. Patient tolerated the procedure well without an immediate complication. Ultrasound and fluoroscopic images were taken and saved for this procedure.  COMPLICATIONS: None  IMPRESSION: Placement of a subcutaneous port device. The catheter tip at the superior cavoatrial junction and ready to be used.   Electronically Signed   By: Markus Daft M.D.   On: 04/27/2014 19:12    Microbiology: Recent Results (from the past 240 hour(s))  Culture, blood (routine x 2)     Status: None (Preliminary result)   Collection Time: 05/13/14  6:51 PM  Result Value Ref Range Status   Specimen Description BLOOD RIGHT ANT  Final   Special Requests BOTTLES DRAWN AEROBIC AND ANAEROBIC 5CC  Final   Culture   Final           BLOOD CULTURE RECEIVED NO GROWTH TO DATE CULTURE  WILL BE HELD FOR 5 DAYS BEFORE ISSUING A FINAL NEGATIVE REPORT Performed at [facility]    Report Status PENDING  Incomplete  Culture, blood (routine x 2)     Status: None (Preliminary result)   Collection Time: 05/13/14  6:51 PM  Result Value Ref Range Status   Specimen Description BLOOD RIGHT HAND  Final   Special Requests BOTTLES DRAWN AEROBIC AND ANAEROBIC 5CC  Final   Culture   Final           BLOOD CULTURE RECEIVED NO GROWTH TO DATE CULTURE WILL BE HELD FOR 5 DAYS BEFORE ISSUING A FINAL NEGATIVE REPORT Performed at [facility]    Report Status PENDING  Incomplete  Culture, Urine     Status: None   Collection Time: 05/13/14  7:36 PM  Result Value Ref Range Status   Specimen Description URINE, CLEAN CATCH  Final   Special Requests NONE  Final   Colony Count   Final    40,000 COLONIES/ML Performed at [facility]    Culture   Final    Multiple bacterial morphotypes present, none predominant. Suggest appropriate recollection if clinically indicated. Performed at [facility]    Report Status 05/15/2014 FINAL  Final     Labs: Basic Metabolic Panel:  Recent Labs Lab 05/13/14 1605 05/14/14 0820 05/15/14 0346 05/16/14 0540 05/17/14 0455  NA 139 136 136 137 136  K 3.2* 4.2 3.2* 4.0 3.4*  CL 106 102 103 102 95*  CO2 26 29 30 29  32  GLUCOSE 95 89 86 88 96  BUN 15 19 13 7 6   CREATININE 0.54 0.79 0.81 0.79 0.78  CALCIUM 6.2*  6.1* 6.4* 7.3* 7.7*   Liver Function Tests:  Recent Labs Lab 05/13/14 1605  AST 15  ALT 10  ALKPHOS 232*  BILITOT 1.1  PROT 5.9*  ALBUMIN 3.2*   No results for input(s): LIPASE, AMYLASE in the last 168 hours. No results for input(s): AMMONIA in the last 168 hours. CBC:  Recent Labs Lab 05/13/14 1605 05/14/14 0820 05/15/14 0346 05/16/14 0540 05/17/14 0455  WBC 1.4* 2.0* 2.8* 6.9 8.1  NEUTROABS 0.5*  --  1.3*  --   --   HGB 5.7* 8.0* 7.3* 10.3* 10.6*  HCT 17.3* 23.0* 21.3* 30.7* 31.4*  MCV 85.2  85.2 85.5 88.2 88.0  PLT <5* <5* 39* 37* 38*   Cardiac Enzymes: No results for input(s): CKTOTAL, CKMB, CKMBINDEX, TROPONINI in the last 168 hours. BNP: BNP (last 3 results) No results for input(s): PROBNP in the last 8760 hours. CBG: No results for input(s): GLUCAP in the last 168 hours.  Time coordinating discharge: Over 30 minutes

## 2014-05-20 NOTE — ED Provider Notes (Signed)
CSN: [id number]     Arrival date & time 05/13/14  1348 History   First MD Initiated Contact with Patient 05/13/14 1500     Chief Complaint  Patient presents with  . Joint Pain  . Cancer     (Consider location/radiation/quality/duration/timing/severity/associated sxs/prior Treatment) HPI   74 y.o. 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 Has nausea but no vomiting . No fevers, chills diarrhea but has tremors that started after chemotherapy. Increasingly weak. Describes generalized weakness. Denies any focal motor weakness. Only walks with a cane but has found this very difficult. Denies any recent trauma. No fevers.   Past Medical History  Diagnosis Date  . GERD (gastroesophageal reflux disease)   . IBS (irritable bowel syndrome)   . Adenomatous colon polyp 06/2009  . Hiatal hernia   . Breast cyst   . Diverticulosis   . Hemorrhoids   . Hyperlipidemia   . Hypertension   . Hyperparathyroidism   . Anxiety   . Migraine   . Osteoporosis   . Vitamin D deficiency   . Spinal stenosis   . C. difficile colitis 2006  . Small cell lung carcinoma dx'd 02/2014  . Bone metastases dx'd 02/2014   Past Surgical History  Procedure Laterality Date  . Cholecystectomy    . Vaginal hysterectomy    . Knee arthroscopy      left  . Parathyroidectomy  2009    [facility]  . Lumbar spine surgery  2001  . Breast biopsy      bilateral  . Total knee arthroplasty      right  . Bladder repair    . Video bronchoscopy Bilateral 03/14/2014    Procedure: VIDEO BRONCHOSCOPY WITHOUT FLUORO;  Surgeon: Tanda Rockers, MD;  Location: [facility];  Service: Cardiopulmonary;  Laterality: Bilateral;   Family History  Problem Relation Age of Onset  . Colon polyps Sister   . Irritable bowel syndrome Sister   . Breast cancer Sister   . Diabetes Sister     X 3  .  Diabetes Brother   . Colon cancer Neg Hx    History  Substance Use Topics  . Smoking status: Former Smoker -- 0.25 packs/day for 25 years    Types: Cigarettes    Quit date: 05/19/2013  . Smokeless tobacco: Never Used  . Alcohol Use: No   OB History    No data available     Review of Systems  All systems reviewed and negative, other than as noted in HPI.   Allergies  Ace inhibitors; Cephalexin; Etodolac; and Gabapentin  Home Medications   Prior to Admission medications   Medication Sig Start Date End Date Taking? Authorizing Provider  ALPRAZolam Duanne Moron) 0.5 MG tablet Take 0.5 mg by mouth at bedtime.     Yes Historical Provider, MD  atorvastatin (LIPITOR) 80 MG tablet Take 80 mg by mouth every morning.    Yes Historical Provider, MD  buPROPion (WELLBUTRIN SR) 150 MG 12 hr tablet Take 150 mg by mouth every morning.    Yes Historical Provider, MD  CALCIUM PO Take 1 tablet by mouth daily.   Yes Historical Provider, MD  dexlansoprazole (DEXILANT) 60 MG capsule TAKE 1 CAPSULE (60 MG TOTAL) BY MOUTH DAILY. Patient taking differently: Take 60 mg by mouth every morning.  07/20/13  Yes Ladene Artist, MD  Diclofenac (ZORVOLEX) 81  MG CAPS Take 35 mg by mouth every morning.    Yes Historical Provider, MD  glycopyrrolate (ROBINUL) 2 MG tablet TAKE 1 TABLET (2 MG TOTAL) BY MOUTH 2 (TWO) TIMES DAILY. 07/20/13  Yes Ladene Artist, MD  hydrochlorothiazide (HYDRODIURIL) 12.5 MG tablet Take 1 tablet by mouth every morning.  02/17/14  Yes Historical Provider, MD  lidocaine-prilocaine (EMLA) cream Apply 1 application topically as needed. Apply to port 1 hr before chemo 04/11/14  Yes Curt Bears, MD  LORazepam (ATIVAN) 0.5 MG tablet Take 1 tablet (0.5 mg total) by mouth every 8 (eight) hours. 04/11/14  Yes Curt Bears, MD  metoprolol (TOPROL-XL) 50 MG 24 hr tablet Take 50 mg by mouth every morning.    Yes Historical Provider, MD  oxycodone (OXY-IR) 5 MG capsule 1-2 every 4 hours as needed for  back pain Patient taking differently: Take 5-10 mg by mouth every 4 (four) hours as needed for pain.  04/07/14  Yes Tanda Rockers, MD  Polyethylene Glycol 3350 (MIRALAX PO) Take 1 scoop by mouth daily as needed (for constipation).    Yes Historical Provider, MD  potassium chloride SA (K-DUR,KLOR-CON) 20 MEQ tablet Take 1 tablet (20 mEq total) by mouth daily. 04/25/14  Yes Drue Second, NP  prochlorperazine (COMPAZINE) 10 MG tablet Take 1 tablet (10 mg total) by mouth every 6 (six) hours as needed for nausea or vomiting. 05/11/14  Yes Curt Bears, MD  feeding supplement, ENSURE COMPLETE, (ENSURE COMPLETE) LIQD Take 237 mLs by mouth 2 (two) times daily between meals. 05/17/14   Robbie Lis, MD   BP 132/53 mmHg  Pulse 87  Temp(Src) 98.6 F (37 C) (Oral)  Resp 16  Ht 5\' 5"  (1.651 m)  Wt 130 lb 15.3 oz (59.4 kg)  BMI 21.79 kg/m2  SpO2 94% Physical Exam  Constitutional: She is oriented to person, place, and time. She appears well-developed and well-nourished. No distress.  HENT:  Head: Normocephalic and atraumatic.  Eyes: Conjunctivae are normal. Right eye exhibits no discharge. Left eye exhibits no discharge.  Neck: Neck supple.  Cardiovascular: Normal rate, regular rhythm and normal heart sounds.  Exam reveals no gallop and no friction rub.   No murmur heard. Pulmonary/Chest: Effort normal and breath sounds normal. No respiratory distress.  Abdominal: Soft. She exhibits no distension. There is no tenderness.  Musculoskeletal: She exhibits no edema or tenderness.  Neurological: She is alert and oriented to person, place, and time.  Resting tremor. Cranial nerves intact. Globally weak, but not focally worse in any particular area.  Skin: Skin is warm and dry.  Psychiatric: She has a normal mood and affect. Her behavior is normal. Thought content normal.  Nursing note and vitals reviewed.   ED Course  Procedures (including critical care time)  CRITICAL CARE Performed by: Virgel Manifold   Total critical care time: 35 minutes  Critical care time was exclusive of separately billable procedures and treating other patients. Critical care was necessary to treat or prevent imminent or life-threatening deterioration. Critical care was time spent personally by me on the following activities: development of treatment plan with patient and/or surrogate as well as nursing, discussions with consultants, evaluation of patient's response to treatment, examination of patient, obtaining history from patient or surrogate, ordering and performing treatments and interventions, ordering and review of laboratory studies, ordering and review of radiographic studies, pulse oximetry and re-evaluation of patient's condition.  Labs Review Labs Reviewed  COMPREHENSIVE METABOLIC PANEL - Abnormal; Notable for the following:  Potassium 3.2 (*)    Calcium 6.2 (*)    Total Protein 5.9 (*)    Albumin 3.2 (*)    Alkaline Phosphatase 232 (*)    All other components within normal limits  CBC WITH DIFFERENTIAL - Abnormal; Notable for the following:    WBC 1.4 (*)    RBC 2.03 (*)    Hemoglobin 5.7 (*)    HCT 17.3 (*)    RDW 16.4 (*)    Platelets <5 (*)    Neutrophils Relative % 33 (*)    Lymphocytes Relative 57 (*)    Neutro Abs 0.5 (*)    All other components within normal limits  URINALYSIS, ROUTINE W REFLEX MICROSCOPIC - Abnormal; Notable for the following:    Color, Urine AMBER (*)    APPearance HAZY (*)    Bilirubin Urine SMALL (*)    Protein, ur 30 (*)    Leukocytes, UA SMALL (*)    All other components within normal limits  BASIC METABOLIC PANEL - Abnormal; Notable for the following:    Calcium 6.1 (*)    GFR calc non Af Amer 81 (*)    All other components within normal limits  CBC - Abnormal; Notable for the following:    WBC 2.0 (*)    RBC 2.70 (*)    Hemoglobin 8.0 (*)    HCT 23.0 (*)    Platelets <5 (*)    All other components within normal limits  URINE MICROSCOPIC-ADD  ON - Abnormal; Notable for the following:    Squamous Epithelial / LPF MANY (*)    All other components within normal limits  CBC WITH DIFFERENTIAL - Abnormal; Notable for the following:    WBC 2.8 (*)    RBC 2.49 (*)    Hemoglobin 7.3 (*)    HCT 21.3 (*)    RDW 15.7 (*)    Platelets 39 (*)    Monocytes Relative 15 (*)    Neutro Abs 1.3 (*)    All other components within normal limits  BASIC METABOLIC PANEL - Abnormal; Notable for the following:    Potassium 3.2 (*)    Calcium 6.4 (*)    GFR calc non Af Amer 71 (*)    GFR calc Af Amer 82 (*)    Anion gap 3 (*)    All other components within normal limits  CBC - Abnormal; Notable for the following:    RBC 3.48 (*)    Hemoglobin 10.3 (*)    HCT 30.7 (*)    RDW 15.6 (*)    Platelets 37 (*)    All other components within normal limits  BASIC METABOLIC PANEL - Abnormal; Notable for the following:    Calcium 7.3 (*)    GFR calc non Af Amer 81 (*)    All other components within normal limits  CBC - Abnormal; Notable for the following:    RBC 3.57 (*)    Hemoglobin 10.6 (*)    HCT 31.4 (*)    RDW 15.8 (*)    Platelets 38 (*)    All other components within normal limits  BASIC METABOLIC PANEL - Abnormal; Notable for the following:    Potassium 3.4 (*)    Chloride 95 (*)    Calcium 7.7 (*)    GFR calc non Af Amer 82 (*)    All other components within normal limits  URINE CULTURE  CULTURE, BLOOD (ROUTINE X 2)  CULTURE, BLOOD (ROUTINE X 2)  TYPE  AND SCREEN  PREPARE RBC (CROSSMATCH)  PREPARE PLATELET PHERESIS  PREPARE RBC (CROSSMATCH)    Imaging Review No results found.   EKG Interpretation None      MDM   Final diagnoses:  Pancytopenia    74 year old female with pain likely related to her cancer. Poorly controlled. Noted to be pancytopenic. Neutropenia but afebrile. Platelets less than 5000, but no overt bleeding. Hemoglobin 5.7. Will transfuse. Needs admission for further evaluation/treatment.  Virgel Manifold,  MD 05/20/14 [phone]

## 2014-05-21 LAB — CULTURE, BLOOD (ROUTINE X 2)
CULTURE: NO GROWTH
Culture: NO GROWTH

## 2014-05-23 ENCOUNTER — Telehealth: Payer: Self-pay | Admitting: Internal Medicine

## 2014-05-23 ENCOUNTER — Ambulatory Visit (HOSPITAL_BASED_OUTPATIENT_CLINIC_OR_DEPARTMENT_OTHER): Payer: Medicare Other | Admitting: Physician Assistant

## 2014-05-23 ENCOUNTER — Other Ambulatory Visit (HOSPITAL_BASED_OUTPATIENT_CLINIC_OR_DEPARTMENT_OTHER): Payer: Medicare Other

## 2014-05-23 ENCOUNTER — Ambulatory Visit: Payer: Medicare Other

## 2014-05-23 ENCOUNTER — Ambulatory Visit (HOSPITAL_BASED_OUTPATIENT_CLINIC_OR_DEPARTMENT_OTHER): Payer: Medicare Other

## 2014-05-23 ENCOUNTER — Encounter: Payer: Self-pay | Admitting: Physician Assistant

## 2014-05-23 VITALS — BP 156/76 | HR 112 | Temp 98.0°F | Resp 18 | Ht 65.0 in | Wt 128.1 lb

## 2014-05-23 DIAGNOSIS — R251 Tremor, unspecified: Secondary | ICD-10-CM

## 2014-05-23 DIAGNOSIS — C7951 Secondary malignant neoplasm of bone: Secondary | ICD-10-CM

## 2014-05-23 DIAGNOSIS — D61810 Antineoplastic chemotherapy induced pancytopenia: Secondary | ICD-10-CM

## 2014-05-23 DIAGNOSIS — C3491 Malignant neoplasm of unspecified part of right bronchus or lung: Secondary | ICD-10-CM

## 2014-05-23 DIAGNOSIS — R0602 Shortness of breath: Secondary | ICD-10-CM

## 2014-05-23 DIAGNOSIS — R911 Solitary pulmonary nodule: Secondary | ICD-10-CM

## 2014-05-23 DIAGNOSIS — R5383 Other fatigue: Secondary | ICD-10-CM

## 2014-05-23 DIAGNOSIS — Z5111 Encounter for antineoplastic chemotherapy: Secondary | ICD-10-CM

## 2014-05-23 DIAGNOSIS — Z95828 Presence of other vascular implants and grafts: Secondary | ICD-10-CM

## 2014-05-23 LAB — COMPREHENSIVE METABOLIC PANEL (CC13)
ALT: 8 U/L (ref 0–55)
AST: 12 U/L (ref 5–34)
Albumin: 3.4 g/dL — ABNORMAL LOW (ref 3.5–5.0)
Alkaline Phosphatase: 285 U/L — ABNORMAL HIGH (ref 40–150)
Anion Gap: 12 mEq/L — ABNORMAL HIGH (ref 3–11)
BUN: 15.4 mg/dL (ref 7.0–26.0)
CHLORIDE: 95 meq/L — AB (ref 98–109)
CO2: 30 mEq/L — ABNORMAL HIGH (ref 22–29)
CREATININE: 0.9 mg/dL (ref 0.6–1.1)
Calcium: 7.2 mg/dL — ABNORMAL LOW (ref 8.4–10.4)
EGFR: 67 mL/min/{1.73_m2} — ABNORMAL LOW (ref >90)
Glucose: 103 mg/dl (ref 70–140)
Potassium: 4.2 mEq/L (ref 3.5–5.1)
SODIUM: 137 meq/L (ref 136–145)
TOTAL PROTEIN: 7.3 g/dL (ref 6.4–8.3)
Total Bilirubin: 1.21 mg/dL — ABNORMAL HIGH (ref 0.20–1.20)

## 2014-05-23 LAB — CBC WITH DIFFERENTIAL/PLATELET
BASO%: 0.2 % (ref 0.0–2.0)
Basophils Absolute: 0 10*3/uL (ref 0.0–0.1)
EOS ABS: 0 10*3/uL (ref 0.0–0.5)
EOS%: 0 % (ref 0.0–7.0)
HCT: 33.3 % — ABNORMAL LOW (ref 34.8–46.6)
HGB: 11.1 g/dL — ABNORMAL LOW (ref 11.6–15.9)
LYMPH#: 1 10*3/uL (ref 0.9–3.3)
LYMPH%: 9.8 % — ABNORMAL LOW (ref 14.0–49.7)
MCH: 29.5 pg (ref 25.1–34.0)
MCHC: 33.3 g/dL (ref 31.5–36.0)
MCV: 88.6 fL (ref 79.5–101.0)
MONO#: 1.7 10*3/uL — ABNORMAL HIGH (ref 0.1–0.9)
MONO%: 16 % — ABNORMAL HIGH (ref 0.0–14.0)
NEUT%: 74 % (ref 38.4–76.8)
NEUTROS ABS: 7.6 10*3/uL — AB (ref 1.5–6.5)
Platelets: 153 10*3/uL (ref 145–400)
RBC: 3.76 10*6/uL (ref 3.70–5.45)
RDW: 17.2 % — AB (ref 11.2–14.5)
WBC: 10.3 10*3/uL (ref 3.9–10.3)

## 2014-05-23 MED ORDER — LORAZEPAM 0.5 MG PO TABS: 0.5000 mg | ORAL_TABLET | Freq: Three times a day (TID) | ORAL | Status: DC

## 2014-05-23 MED ORDER — SODIUM CHLORIDE 0.9 % IJ SOLN
10.0000 mL | INTRAMUSCULAR | Status: DC | PRN
  Administered 2014-05-23: 10 mL
  Filled 2014-05-23: qty 10

## 2014-05-23 MED ORDER — HEPARIN SOD (PORK) LOCK FLUSH 100 UNIT/ML IV SOLN
500.0000 [IU] | Freq: Once | INTRAVENOUS | Status: AC | PRN
  Administered 2014-05-23: 500 [IU]
  Filled 2014-05-23: qty 5

## 2014-05-23 MED ORDER — SODIUM CHLORIDE 0.9 % IV SOLN
Freq: Once | INTRAVENOUS | Status: AC
  Administered 2014-05-23: 13:00:00 via INTRAVENOUS

## 2014-05-23 MED ORDER — DEXAMETHASONE SODIUM PHOSPHATE 20 MG/5ML IJ SOLN
INTRAMUSCULAR | Status: AC
  Filled 2014-05-23: qty 5

## 2014-05-23 MED ORDER — DEXAMETHASONE SODIUM PHOSPHATE 20 MG/5ML IJ SOLN
20.0000 mg | Freq: Once | INTRAMUSCULAR | Status: AC
  Administered 2014-05-23: 20 mg via INTRAVENOUS

## 2014-05-23 MED ORDER — SODIUM CHLORIDE 0.9 % IV SOLN
290.8000 mg | Freq: Once | INTRAVENOUS | Status: AC
  Administered 2014-05-23: 290 mg via INTRAVENOUS
  Filled 2014-05-23: qty 29

## 2014-05-23 MED ORDER — SODIUM CHLORIDE 0.9 % IJ SOLN
10.0000 mL | INTRAMUSCULAR | Status: DC | PRN
  Administered 2014-05-23: 10 mL via INTRAVENOUS
  Filled 2014-05-23: qty 10

## 2014-05-23 MED ORDER — ONDANSETRON 16 MG/50ML IVPB (CHCC)
16.0000 mg | Freq: Once | INTRAVENOUS | Status: AC
  Administered 2014-05-23: 16 mg via INTRAVENOUS

## 2014-05-23 MED ORDER — SODIUM CHLORIDE 0.9 % IV SOLN
80.0000 mg/m2 | Freq: Once | INTRAVENOUS | Status: AC
  Administered 2014-05-23: 140 mg via INTRAVENOUS
  Filled 2014-05-23: qty 7

## 2014-05-23 MED ORDER — ONDANSETRON 16 MG/50ML IVPB (CHCC)
INTRAVENOUS | Status: AC
  Filled 2014-05-23: qty 16

## 2014-05-23 NOTE — Telephone Encounter (Signed)
gv pt husband appt schedule for jan. [facility] will call re [facility] - husband aware. per AJ ok w/her 1/26 - MM full.

## 2014-05-23 NOTE — Progress Notes (Addendum)
[place] [phone]   [phone]  OFFICE PROGRESS NOTE  [name], MD [address] [place] [address]  DIAGNOSIS: Extensive stage, stage IV (T3, N2, M1b) small cell lung cancer diagnosed in October 2015.  PRIOR THERAPY: None  CURRENT THERAPY: Systemic chemotherapy with carboplatin for AUC of 5 on day 1 and etoposide 100 MG/M2 on days 1, 2 and 3 with Neulasta support on day 4. Status post 3 cycles. Starting with cycle #4 her carboplatin will be for an AUC of 4 given on day 1 and etoposide at 80 mg/m given on days 1, 2 and 3 with Neulasta support given on day 4.  INTERVAL HISTORY: [name] 74 y.o. female returns to the clinic today for follow-up visit accompanied by her husband. After her last cycle of chemotherapy she was admitted to the hospital due to pancytopenia. Otherwise she tolerated the last cycle of her systemic chemotherapy with carboplatin and etoposide fairly well except for mild fatigue. She denied having any significant nausea or vomiting, no fever or chills. The patient denied having any significant chest pain but continues to have shortness breath with exertion was no cough or hemoptysis. She requests a refill for her Ativan. She will presents for reevaluation of her laboratory data as well as to proceed with cycle #4 of her systemic chemotherapy with carboplatin and etoposide with Neulasta support.  MEDICAL HISTORY: Past Medical History  Diagnosis Date  . GERD (gastroesophageal reflux disease)   . IBS (irritable bowel syndrome)   . Adenomatous colon polyp 06/2009  . Hiatal hernia   . Breast cyst   . Diverticulosis   . Hemorrhoids   . Hyperlipidemia   . Hypertension   . Hyperparathyroidism   . Anxiety   . Migraine   . Osteoporosis   . Vitamin D deficiency   . Spinal stenosis   . C. difficile colitis 2006  . Small cell lung carcinoma dx'd 02/2014  . Bone metastases dx'd 02/2014    ALLERGIES:  is allergic to  ace inhibitors; cephalexin; etodolac; and gabapentin.  MEDICATIONS:  Current Outpatient Prescriptions  Medication Sig Dispense Refill  . ALPRAZolam (XANAX) 0.5 MG tablet Take 0.5 mg by mouth at bedtime.      Marland Kitchen atorvastatin (LIPITOR) 80 MG tablet Take 80 mg by mouth every morning.     Marland Kitchen buPROPion (WELLBUTRIN SR) 150 MG 12 hr tablet Take 150 mg by mouth every morning.     Marland Kitchen CALCIUM PO Take 1 tablet by mouth daily.    Marland Kitchen dexlansoprazole (DEXILANT) 60 MG capsule TAKE 1 CAPSULE (60 MG TOTAL) BY MOUTH DAILY. (Patient taking differently: Take 60 mg by mouth every morning. ) 30 capsule 11  . Diclofenac (ZORVOLEX) 35 MG CAPS Take 35 mg by mouth every morning.     . feeding supplement, ENSURE COMPLETE, (ENSURE COMPLETE) LIQD Take 237 mLs by mouth 2 (two) times daily between meals. 237 mL 0  . glycopyrrolate (ROBINUL) 2 MG tablet TAKE 1 TABLET (2 MG TOTAL) BY MOUTH 2 (TWO) TIMES DAILY. 60 tablet 11  . hydrochlorothiazide (HYDRODIURIL) 12.5 MG tablet Take 1 tablet by mouth every morning.     . lidocaine-prilocaine (EMLA) cream Apply 1 application topically as needed. Apply to port 1 hr before chemo 30 g 0  . LORazepam (ATIVAN) 0.5 MG tablet Take 1 tablet (0.5 mg total) by mouth every 8 (eight) hours. 30 tablet 0  . metoprolol (TOPROL-XL) 50 MG 24 hr tablet Take 50  mg by mouth every morning.     Marland Kitchen oxycodone (OXY-IR) 5 MG capsule 1-2 every 4 hours as needed for back pain (Patient taking differently: Take 5-10 mg by mouth every 4 (four) hours as needed for pain. ) 90 capsule 0  . Polyethylene Glycol 3350 (MIRALAX PO) Take 1 scoop by mouth daily as needed (for constipation).     . potassium chloride SA (K-DUR,KLOR-CON) 20 MEQ tablet Take 1 tablet (20 mEq total) by mouth daily. 30 tablet 0  . prochlorperazine (COMPAZINE) 10 MG tablet Take 1 tablet (10 mg total) by mouth every 6 (six) hours as needed for nausea or vomiting. 30 tablet 1   No current facility-administered medications for this visit.    Facility-Administered Medications Ordered in Other Visits  Medication Dose Route Frequency Provider Last Rate Last Dose  . sodium chloride 0.9 % injection 10 mL  10 mL Intracatheter PRN Curt Bears, MD   10 mL at 05/23/14 1524    SURGICAL HISTORY:  Past Surgical History  Procedure Laterality Date  . Cholecystectomy    . Vaginal hysterectomy    . Knee arthroscopy      left  . Parathyroidectomy  2009    [facility]  . Lumbar spine surgery  2001  . Breast biopsy      bilateral  . Total knee arthroplasty      right  . Bladder repair    . Video bronchoscopy Bilateral 03/14/2014    Procedure: VIDEO BRONCHOSCOPY WITHOUT FLUORO;  Surgeon: Tanda Rockers, MD;  Location: [facility];  Service: Cardiopulmonary;  Laterality: Bilateral;    REVIEW OF SYSTEMS:  Constitutional: positive for anorexia, fatigue and weight loss Eyes: negative Ears, nose, mouth, throat, and face: negative Respiratory: positive for dyspnea on exertion Cardiovascular: negative Gastrointestinal: negative Genitourinary:negative Integument/breast: negative Hematologic/lymphatic: negative Musculoskeletal:negative Neurological: positive for tremors Behavioral/Psych: negative Endocrine: negative Allergic/Immunologic: negative   PHYSICAL EXAMINATION: General appearance: alert, cooperative, fatigued and no distress Head: Normocephalic, without obvious abnormality, atraumatic Neck: no adenopathy, no JVD, supple, symmetrical, trachea midline and thyroid not enlarged, symmetric, no tenderness/mass/nodules Lymph nodes: Cervical, supraclavicular, and axillary nodes normal. Resp: clear to auscultation bilaterally Back: symmetric, no curvature. ROM normal. No CVA tenderness. Cardio: regular rate and rhythm, S1, S2 normal, no murmur, click, rub or gallop GI: soft, non-tender; bowel sounds normal; no masses,  no organomegaly Extremities: extremities normal, atraumatic, no cyanosis or edema Neurologic: Grossly  normal  ECOG PERFORMANCE STATUS: 1 - Symptomatic but completely ambulatory  Blood pressure 156/76, pulse 112, temperature 98 F (36.7 C), temperature source Oral, resp. rate 18, height 5\' 5"  (1.651 m), weight 128 lb 1.6 oz (58.106 kg), SpO2 98 %.  LABORATORY DATA: Lab Results  Component Value Date   WBC 10.3 05/23/2014   HGB 11.1* 05/23/2014   HCT 33.3* 05/23/2014   MCV 88.6 05/23/2014   PLT 153 05/23/2014      Chemistry      Component Value Date/Time   NA 137 05/23/2014 1137   NA 136 05/17/2014 0455   K 4.2 05/23/2014 1137   K 3.4* 05/17/2014 0455   CL 95* 05/17/2014 0455   CO2 30* 05/23/2014 1137   CO2 32 05/17/2014 0455   BUN 15.4 05/23/2014 1137   BUN 6 05/17/2014 0455   CREATININE 0.9 05/23/2014 1137   CREATININE 0.78 05/17/2014 0455      Component Value Date/Time   CALCIUM 7.2* 05/23/2014 1137   CALCIUM 7.7* 05/17/2014 0455   ALKPHOS 285* 05/23/2014 1137  ALKPHOS 232* 05/13/2014 1605   AST 12 05/23/2014 1137   AST 15 05/13/2014 1605   ALT 8 05/23/2014 1137   ALT 10 05/13/2014 1605   BILITOT 1.21* 05/23/2014 1137   BILITOT 1.1 05/13/2014 1605       RADIOGRAPHIC STUDIES: Ct Chest W Contrast  05/01/2014   CLINICAL DATA:  Subsequent treatment strategy for squamous cell carcinoma with bone metastasis.  EXAM: CT CHEST, ABDOMEN, AND PELVIS WITH CONTRAST  TECHNIQUE: Multidetector CT imaging of the chest, abdomen and pelvis was performed following the standard protocol during bolus administration of intravenous contrast.  CONTRAST:  121mL OMNIPAQUE IOHEXOL 300 MG/ML  SOLN  COMPARISON:  02/20/2014  FINDINGS: CT CHEST FINDINGS  Mediastinum: The heart size appears normal. No pericardial effusion identified. Calcified atherosclerotic disease involves the thoracic aorta. Calcification within the RCA Coronary artery also noted. Index right paratracheal lymph node measures 1.5 cm, image 24/ series 2. Previously 2.5 cm. Sub- carinal lymph node measures 1.5 cm, image 30/ series  2. Previously 2.8 cm. The right hilar lymph node measures 1.9 cm, image 28/ series 2. Previously 3.1 cm.  Lungs/Pleura: There is no pleural effusion identified. Moderate changes of centrilobular and paraseptal emphysema are noted in both upper and lower lobes. Bilateral pulmonary nodules are identified compatible with metastatic disease. Index nodule in the left lower lobe measures 9 mm, image 46/ series 4. This is new from the previous exam. Also new from the prior exam is a 9 mm left lower lobe nodule, image 42/series 4.Improvement in pleural nodularity overlying the right lung. The index lesion within the posterior medial right lower lobe measures 1.1 x 0.5 cm, image 32/series 4. Previously 2.5 x 1.1 cm.  Musculoskeletal: Review of the visualized osseous structures shows multi focal areas of bone metastases involving the thoracic and lumbar spine. Pathologic fracture involving the T9 vertebra has progressed from the previous exam. There is a new lucent lesion involving the posterior aspect of the T8 vertebra measuring 1.2 cm, image 61 of 603. Mixed lytic and sclerotic bone lesion involving T12 appears stable.  CT ABDOMEN AND PELVIS FINDINGS  Hepatobiliary: There is no suspicious liver abnormality identified. Previous cholecystectomy.  Pancreas: Normal appearance of the pancreas.  Spleen: The spleen appears normal.  Adrenals/Urinary Tract: 7 mm right adrenal nodule is unchanged. The 1.1 cm right adrenal nodule is also stable. Normal appearance of the left adrenal gland. Bilateral renal cysts are identified. The urinary bladder appears normal.  Stomach/Bowel: The stomach appears normal. The small bowel loops have a normal course and caliber without obstruction. Normal appearance of the appendix. Multiple colonic diverticula identified without acute inflammation.  Vascular/Lymphatic: Calcified atherosclerotic disease involves the abdominal aorta. There is no aneurysm. The aorta has a maximum AP dimension of 2.4 cm.  No enlarged retroperitoneal or mesenteric adenopathy. No enlarged pelvic or inguinal lymph nodes.  Reproductive: Previous hysterectomy.  There is no adnexal mass.  Other: There is no ascites or focal fluid collections within the abdomen or pelvis.  Musculoskeletal: Multi focal bone metastases are again identified. The patient is status post posterior hardware fixation of the lower lumbar spine. There is a lytic lesion involving the right iliac wing measuring 1.6 cm, image 88/ series 2. Previously 1.3 cm. 2.8 cm sclerotic lesion within the posterior right iliac bone is identified. Unchanged from previous study.  IMPRESSION: 1. No acute findings within the chest abdomen or pelvis. 2. Mixed interval response to therapy. There has been decrease in size of right hilar mass and  mediastinal lymph nodes. Additionally, pleural nodularity overlying the right lung has decreased in the interval. 3. There are several new pulmonary nodules in the left lower lobe, worrisome for metastatic disease. 4. Multi focal bone metastasis are again noted. These appear stable to slightly progressed from previous exam. Specifically, there is a lesion in the right iliac wing which is slightly increased in the interval. There is also a lesion involving T8 which appears new from 03/02/2014. Progression of pathologic T9 fracture.   Electronically Signed   By: Kerby Moors M.D.   On: 05/01/2014 15:10   Ct Abdomen Pelvis W Contrast  05/01/2014   CLINICAL DATA:  Subsequent treatment strategy for squamous cell carcinoma with bone metastasis.  EXAM: CT CHEST, ABDOMEN, AND PELVIS WITH CONTRAST  TECHNIQUE: Multidetector CT imaging of the chest, abdomen and pelvis was performed following the standard protocol during bolus administration of intravenous contrast.  CONTRAST:  157mL OMNIPAQUE IOHEXOL 300 MG/ML  SOLN  COMPARISON:  02/20/2014  FINDINGS: CT CHEST FINDINGS  Mediastinum: The heart size appears normal. No pericardial effusion identified.  Calcified atherosclerotic disease involves the thoracic aorta. Calcification within the RCA Coronary artery also noted. Index right paratracheal lymph node measures 1.5 cm, image 24/ series 2. Previously 2.5 cm. Sub- carinal lymph node measures 1.5 cm, image 30/ series 2. Previously 2.8 cm. The right hilar lymph node measures 1.9 cm, image 28/ series 2. Previously 3.1 cm.  Lungs/Pleura: There is no pleural effusion identified. Moderate changes of centrilobular and paraseptal emphysema are noted in both upper and lower lobes. Bilateral pulmonary nodules are identified compatible with metastatic disease. Index nodule in the left lower lobe measures 9 mm, image 46/ series 4. This is new from the previous exam. Also new from the prior exam is a 9 mm left lower lobe nodule, image 42/series 4.Improvement in pleural nodularity overlying the right lung. The index lesion within the posterior medial right lower lobe measures 1.1 x 0.5 cm, image 32/series 4. Previously 2.5 x 1.1 cm.  Musculoskeletal: Review of the visualized osseous structures shows multi focal areas of bone metastases involving the thoracic and lumbar spine. Pathologic fracture involving the T9 vertebra has progressed from the previous exam. There is a new lucent lesion involving the posterior aspect of the T8 vertebra measuring 1.2 cm, image 61 of 603. Mixed lytic and sclerotic bone lesion involving T12 appears stable.  CT ABDOMEN AND PELVIS FINDINGS  Hepatobiliary: There is no suspicious liver abnormality identified. Previous cholecystectomy.  Pancreas: Normal appearance of the pancreas.  Spleen: The spleen appears normal.  Adrenals/Urinary Tract: 7 mm right adrenal nodule is unchanged. The 1.1 cm right adrenal nodule is also stable. Normal appearance of the left adrenal gland. Bilateral renal cysts are identified. The urinary bladder appears normal.  Stomach/Bowel: The stomach appears normal. The small bowel loops have a normal course and caliber without  obstruction. Normal appearance of the appendix. Multiple colonic diverticula identified without acute inflammation.  Vascular/Lymphatic: Calcified atherosclerotic disease involves the abdominal aorta. There is no aneurysm. The aorta has a maximum AP dimension of 2.4 cm. No enlarged retroperitoneal or mesenteric adenopathy. No enlarged pelvic or inguinal lymph nodes.  Reproductive: Previous hysterectomy.  There is no adnexal mass.  Other: There is no ascites or focal fluid collections within the abdomen or pelvis.  Musculoskeletal: Multi focal bone metastases are again identified. The patient is status post posterior hardware fixation of the lower lumbar spine. There is a lytic lesion involving the right iliac wing measuring 1.6  cm, image 88/ series 2. Previously 1.3 cm. 2.8 cm sclerotic lesion within the posterior right iliac bone is identified. Unchanged from previous study.  IMPRESSION: 1. No acute findings within the chest abdomen or pelvis. 2. Mixed interval response to therapy. There has been decrease in size of right hilar mass and mediastinal lymph nodes. Additionally, pleural nodularity overlying the right lung has decreased in the interval. 3. There are several new pulmonary nodules in the left lower lobe, worrisome for metastatic disease. 4. Multi focal bone metastasis are again noted. These appear stable to slightly progressed from previous exam. Specifically, there is a lesion in the right iliac wing which is slightly increased in the interval. There is also a lesion involving T8 which appears new from 03/02/2014. Progression of pathologic T9 fracture.   Electronically Signed   By: Kerby Moors M.D.   On: 05/01/2014 15:10   Ir Fluoro Guide Cv Line Right  04/27/2014   CLINICAL DATA:  74 year old with small cell carcinoma of the lung. Port-A-Cath needed for chemotherapy.  EXAM: FLUOROSCOPIC AND ULTRASOUND GUIDED PLACEMENT OF A SUBCUTANEOUS PORT.  Physician: Stephan Minister. Henn, MD  FLUOROSCOPY TIME:  18  seconds, 2 mGy  MEDICATIONS AND MEDICAL HISTORY: Vancomycin 1 g, 2 mg versed, 100 mcg fentanyl. Vancomycin was given within two hours of incision. Vancomycin was given due to a cephalosporin allergy. A radiology nurse monitored the patient for moderate sedation.  ANESTHESIA/SEDATION: Moderate sedation time: 40 minutes  PROCEDURE: The risks of the procedure were explained to the patient. Informed consent was obtained. Patient was placed supine on the interventional table. Ultrasound confirmed a patent right internal jugular vein. The right chest and neck were cleaned with a skin antiseptic and a sterile drape was placed. Maximal barrier sterile technique was utilized including caps, mask, sterile gowns, sterile gloves, sterile drape, hand hygiene and skin antiseptic. The right neck was anesthetized with 1% lidocaine. Small incision was made in the right neck with a blade. Micropuncture set was placed in the right internal jugular vein with ultrasound guidance. The micropuncture wire was used for measurement purposes. The right chest was anesthetized with 1% lidocaine with epinephrine. #15 blade was used to make an incision and a subcutaneous port pocket was formed. [place] was assembled. Subcutaneous tunnel was formed with a stiff tunneling device. The port catheter was brought through the subcutaneous tunnel. The port was placed in the subcutaneous pocket. The micropuncture set was exchanged for a peel-away sheath. The catheter was placed through the peel-away sheath and the tip was positioned at the superior cavoatrial junction. Catheter placement was confirmed with fluoroscopy. The port was accessed and flushed with heparinized saline. The port pocket was closed using two layers of absorbable sutures and Dermabond. The vein skin site was closed using a single layer of absorbable suture and Dermabond. Sterile dressings were applied. Patient tolerated the procedure well without an immediate complication.  Ultrasound and fluoroscopic images were taken and saved for this procedure.  COMPLICATIONS: None  IMPRESSION: Placement of a subcutaneous port device. The catheter tip at the superior cavoatrial junction and ready to be used.   Electronically Signed   By: Markus Daft M.D.   On: 04/27/2014 19:12   Ir US Guide Vasc Access Right  04/27/2014   CLINICAL DATA:  74 year old with small cell carcinoma of the lung. Port-A-Cath needed for chemotherapy.  EXAM: FLUOROSCOPIC AND ULTRASOUND GUIDED PLACEMENT OF A SUBCUTANEOUS PORT.  Physician: Stephan Minister. Anselm Pancoast, MD  FLUOROSCOPY TIME:  53  seconds, 2 mGy  MEDICATIONS AND MEDICAL HISTORY: Vancomycin 1 g, 2 mg versed, 100 mcg fentanyl. Vancomycin was given within two hours of incision. Vancomycin was given due to a cephalosporin allergy. A radiology nurse monitored the patient for moderate sedation.  ANESTHESIA/SEDATION: Moderate sedation time: 40 minutes  PROCEDURE: The risks of the procedure were explained to the patient. Informed consent was obtained. Patient was placed supine on the interventional table. Ultrasound confirmed a patent right internal jugular vein. The right chest and neck were cleaned with a skin antiseptic and a sterile drape was placed. Maximal barrier sterile technique was utilized including caps, mask, sterile gowns, sterile gloves, sterile drape, hand hygiene and skin antiseptic. The right neck was anesthetized with 1% lidocaine. Small incision was made in the right neck with a blade. Micropuncture set was placed in the right internal jugular vein with ultrasound guidance. The micropuncture wire was used for measurement purposes. The right chest was anesthetized with 1% lidocaine with epinephrine. #15 blade was used to make an incision and a subcutaneous port pocket was formed. [place] was assembled. Subcutaneous tunnel was formed with a stiff tunneling device. The port catheter was brought through the subcutaneous tunnel. The port was placed in the  subcutaneous pocket. The micropuncture set was exchanged for a peel-away sheath. The catheter was placed through the peel-away sheath and the tip was positioned at the superior cavoatrial junction. Catheter placement was confirmed with fluoroscopy. The port was accessed and flushed with heparinized saline. The port pocket was closed using two layers of absorbable sutures and Dermabond. The vein skin site was closed using a single layer of absorbable suture and Dermabond. Sterile dressings were applied. Patient tolerated the procedure well without an immediate complication. Ultrasound and fluoroscopic images were taken and saved for this procedure.  COMPLICATIONS: None  IMPRESSION: Placement of a subcutaneous port device. The catheter tip at the superior cavoatrial junction and ready to be used.   Electronically Signed   By: Markus Daft M.D.   On: 04/27/2014 19:12    ASSESSMENT AND PLAN: This is a very pleasant 74 years old white female with:  1) Extensive stage small cell lung cancer currently undergoing systemic chemotherapy with carboplatin and etoposide status post 2 cycles. The patient is tolerating her treatment fairly well except for mild fatigue, alopecia and new tremors.  The last CT scan of the chest showed significant improvement in her disease in the chest except for some new pulmonary nodules concerning for metastatic disease. These could be inflammatory in origin especially with the good response in the other lesions. She also has some concerning findings in the bone but healing process in the bone could show up as disease progression. She is currently on treatment with Xgeva. The patient was discussed with and also seen by Dr. Julien Nordmann. She will proceed with cycle #4 today as scheduled however her chemotherapy will be dose reduce as follows: Carboplatin to an AUC of 4 and etoposide to 80 mg/m   2) metastatic bone disease: The patient will continue on Xgeva 120 mcg subcutaneously every 4  weeks. The patient has no dental issues and currently has dentures. She was also advised to take calcium and vitamin D supplements on daily basis.  3) chemotherapy-induced anemia, resolved  4) For the tremors, she will continue on Ativan 0.5 mg by mouth every 8 hours as needed. She is given a refill for this medication.  5) Hypokalemia: Resolved   She will follow-up in 3 weeks  with a restaging CT scan of the chest, abdomen and pelvis with contrast to reevaluate her disease. She will continue with weekly labs in the interim as scheduled.  The patient was advised to call immediately if she has any concerning symptoms in the interval. The patient voices understanding of current disease status and treatment options and is in agreement with the current care plan.  All questions were answered. The patient knows to call the clinic with any problems, questions or concerns. We can certainly see the patient much sooner if necessary.  Carlton Adam PA-C  ADDENDUM: Hematology/Oncology Attending: I had a face to face encounter with the patient. I recommended her care plan. This is a very pleasant 74 years old white female with extensive stage small cell lung cancer currently undergoing systemic chemotherapy with carboplatin and etoposide status post 3 cycles. She is tolerating her treatment well except for fatigue and weakness as well as pancytopenia secondary to chemotherapy. She was admitted to [facility] recently with neutropenic fever and chemotherapy-induced anemia and thrombocytopenia. The patient has significantly improved and her blood count recovers. We will proceed with cycle #4 of her chemotherapy today as scheduled but I will reduce the dose of carboplatin to AUC of 4 and etoposide to 80 MG/M2. The patient would come back for follow-up visit in 3 weeks for evaluation after repeating CT scan of the chest, abdomen and pelvis for evaluation of her disease. She was advised to call  immediately if she has any concerning symptoms in the interval.  Disclaimer: This note was dictated with voice recognition software. Similar sounding words can inadvertently be transcribed and may not be corrected upon review. Eilleen Kempf., MD 05/24/2014

## 2014-05-23 NOTE — Patient Instructions (Signed)
[facility] Discharge Instructions for Patients Receiving Chemotherapy  Today you received the following chemotherapy agents:  Carboplatin and Etoposide  To help prevent nausea and vomiting after your treatment, we encourage you to take your nausea medication as ordered per MD.   If you develop nausea and vomiting that is not controlled by your nausea medication, call the clinic.   BELOW ARE SYMPTOMS THAT SHOULD BE REPORTED IMMEDIATELY:  *FEVER GREATER THAN 100.5 F  *CHILLS WITH OR WITHOUT FEVER  NAUSEA AND VOMITING THAT IS NOT CONTROLLED WITH YOUR NAUSEA MEDICATION  *UNUSUAL SHORTNESS OF BREATH  *UNUSUAL BRUISING OR BLEEDING  TENDERNESS IN MOUTH AND THROAT WITH OR WITHOUT PRESENCE OF ULCERS  *URINARY PROBLEMS  *BOWEL PROBLEMS  UNUSUAL RASH Items with * indicate a potential emergency and should be followed up as soon as possible.  Feel free to call the clinic you have any questions or concerns. The clinic phone number is [phone].

## 2014-05-23 NOTE — Patient Instructions (Signed)

## 2014-05-24 ENCOUNTER — Ambulatory Visit (HOSPITAL_BASED_OUTPATIENT_CLINIC_OR_DEPARTMENT_OTHER): Payer: Medicare Other

## 2014-05-24 DIAGNOSIS — C3491 Malignant neoplasm of unspecified part of right bronchus or lung: Secondary | ICD-10-CM

## 2014-05-24 DIAGNOSIS — C7951 Secondary malignant neoplasm of bone: Secondary | ICD-10-CM

## 2014-05-24 DIAGNOSIS — Z5111 Encounter for antineoplastic chemotherapy: Secondary | ICD-10-CM

## 2014-05-24 MED ORDER — PROCHLORPERAZINE MALEATE 10 MG PO TABS
10.0000 mg | ORAL_TABLET | Freq: Once | ORAL | Status: AC
  Administered 2014-05-24: 10 mg via ORAL

## 2014-05-24 MED ORDER — SODIUM CHLORIDE 0.9 % IJ SOLN
10.0000 mL | INTRAMUSCULAR | Status: DC | PRN
  Administered 2014-05-24: 10 mL
  Filled 2014-05-24: qty 10

## 2014-05-24 MED ORDER — PROCHLORPERAZINE MALEATE 10 MG PO TABS
ORAL_TABLET | ORAL | Status: AC
  Filled 2014-05-24: qty 1

## 2014-05-24 MED ORDER — SODIUM CHLORIDE 0.9 % IV SOLN
Freq: Once | INTRAVENOUS | Status: AC
Start: 2014-05-24 — End: 2014-05-24
  Administered 2014-05-24: 16:00:00 via INTRAVENOUS

## 2014-05-24 MED ORDER — SODIUM CHLORIDE 0.9 % IV SOLN
80.0000 mg/m2 | Freq: Once | INTRAVENOUS | Status: AC
  Administered 2014-05-24: 140 mg via INTRAVENOUS
  Filled 2014-05-24: qty 7

## 2014-05-24 MED ORDER — HEPARIN SOD (PORK) LOCK FLUSH 100 UNIT/ML IV SOLN
500.0000 [IU] | Freq: Once | INTRAVENOUS | Status: AC | PRN
  Administered 2014-05-24: 500 [IU]
  Filled 2014-05-24: qty 5

## 2014-05-24 NOTE — Patient Instructions (Signed)
Continue weekly labs as scheduled Continue to take the Ativan 0.5 mg by mouth every 8 hours as needed for your tremors Follow-up in 3 weeks with a restaging CT scan of your chest, abdomen and pelvis to reevaluate your disease, prior to next scheduled cycle of chemotherapy

## 2014-05-24 NOTE — Patient Instructions (Signed)
[facility] Discharge Instructions for Patients Receiving Chemotherapy  Today you received the following chemotherapy agents Etopisde/ VP-16.  To help prevent nausea and vomiting after your treatment, we encourage you to take your nausea medication Compazine 10 mg, ativan, lorazepam 0.5 mg.   If you develop nausea and vomiting that is not controlled by your nausea medication, call the clinic.   BELOW ARE SYMPTOMS THAT SHOULD BE REPORTED IMMEDIATELY:  *FEVER GREATER THAN 100.5 F  *CHILLS WITH OR WITHOUT FEVER  NAUSEA AND VOMITING THAT IS NOT CONTROLLED WITH YOUR NAUSEA MEDICATION  *UNUSUAL SHORTNESS OF BREATH  *UNUSUAL BRUISING OR BLEEDING  TENDERNESS IN MOUTH AND THROAT WITH OR WITHOUT PRESENCE OF ULCERS  *URINARY PROBLEMS  *BOWEL PROBLEMS  UNUSUAL RASH Items with * indicate a potential emergency and should be followed up as soon as possible.  Feel free to call the clinic you have any questions or concerns. The clinic phone number is [phone].

## 2014-05-24 NOTE — Progress Notes (Signed)
Discharged at 1750 via wheelchair with spouse.  No distress noted

## 2014-05-25 ENCOUNTER — Ambulatory Visit (HOSPITAL_BASED_OUTPATIENT_CLINIC_OR_DEPARTMENT_OTHER): Payer: Medicare Other

## 2014-05-25 ENCOUNTER — Ambulatory Visit: Payer: Medicare Other | Admitting: Nutrition

## 2014-05-25 DIAGNOSIS — C3491 Malignant neoplasm of unspecified part of right bronchus or lung: Secondary | ICD-10-CM

## 2014-05-25 DIAGNOSIS — Z5111 Encounter for antineoplastic chemotherapy: Secondary | ICD-10-CM

## 2014-05-25 MED ORDER — SODIUM CHLORIDE 0.9 % IV SOLN
80.0000 mg/m2 | Freq: Once | INTRAVENOUS | Status: AC
  Administered 2014-05-25: 140 mg via INTRAVENOUS
  Filled 2014-05-25: qty 7

## 2014-05-25 MED ORDER — PROCHLORPERAZINE MALEATE 10 MG PO TABS
10.0000 mg | ORAL_TABLET | Freq: Once | ORAL | Status: AC
  Administered 2014-05-25: 10 mg via ORAL

## 2014-05-25 MED ORDER — SODIUM CHLORIDE 0.9 % IJ SOLN
10.0000 mL | INTRAMUSCULAR | Status: DC | PRN
  Administered 2014-05-25: 10 mL
  Filled 2014-05-25: qty 10

## 2014-05-25 MED ORDER — HEPARIN SOD (PORK) LOCK FLUSH 100 UNIT/ML IV SOLN
500.0000 [IU] | Freq: Once | INTRAVENOUS | Status: AC | PRN
  Administered 2014-05-25: 500 [IU]
  Filled 2014-05-25: qty 5

## 2014-05-25 MED ORDER — SODIUM CHLORIDE 0.9 % IV SOLN
Freq: Once | INTRAVENOUS | Status: AC
  Administered 2014-05-25: 16:00:00 via INTRAVENOUS

## 2014-05-25 MED ORDER — PROCHLORPERAZINE MALEATE 10 MG PO TABS
ORAL_TABLET | ORAL | Status: AC
  Filled 2014-05-25: qty 1

## 2014-05-25 NOTE — Progress Notes (Signed)
Nutrition follow-up completed with patient in chemotherapy. Patient reports nausea and poor appetite continue. Patient feels she is eating better. Weight decreased and documented as 128.1 pounds January 5, down from 131.4 pounds December 15. Patient also complains of constipation.  Nutrition diagnosis: Inadequate oral intake continues.  Intervention: Patient educated to continue high-calorie, high-protein meals and snacks. Patient reminded to take nausea medication as prescribed. Recommended patient continue Ensure Plus 4 ounces 4 times a day. Educated patient on strategies for improving constipation.  Provided fact sheets. Questions were answered and teach back method used. Provided coupons for oral nutrition supplements.  Monitoring, evaluation, goals: Patient will tolerate adequate calories and protein to minimize further weight loss.  Next visit: Tuesday, January 26, during chemotherapy.  **Disclaimer: This note was dictated with voice recognition software. Similar sounding words can inadvertently be transcribed and this note may contain transcription errors which may not have been corrected upon publication of note.**

## 2014-05-25 NOTE — Progress Notes (Signed)
Pt completed Etoposide chemo today after 5 pm.  Per Dr. Julien Nordmann, pt can either receive Neulasta late tomorrow or Saturday.  Explanations given to pt and husband.  Pt agreed to come in on Saturday at 0815 am.  POF sent to scheduler.  Gerald Stabs, pharmacist notified.

## 2014-05-25 NOTE — Patient Instructions (Signed)
[facility] Discharge Instructions for Patients Receiving Chemotherapy  Today you received the following chemotherapy agents: Etoposide  To help prevent nausea and vomiting after your treatment, we encourage you to take your nausea medication as prescribed by your physician.   If you develop nausea and vomiting that is not controlled by your nausea medication, call the clinic.   BELOW ARE SYMPTOMS THAT SHOULD BE REPORTED IMMEDIATELY:  *FEVER GREATER THAN 100.5 F  *CHILLS WITH OR WITHOUT FEVER  NAUSEA AND VOMITING THAT IS NOT CONTROLLED WITH YOUR NAUSEA MEDICATION  *UNUSUAL SHORTNESS OF BREATH  *UNUSUAL BRUISING OR BLEEDING  TENDERNESS IN MOUTH AND THROAT WITH OR WITHOUT PRESENCE OF ULCERS  *URINARY PROBLEMS  *BOWEL PROBLEMS  UNUSUAL RASH Items with * indicate a potential emergency and should be followed up as soon as possible.  Feel free to call the clinic you have any questions or concerns. The clinic phone number is [phone].

## 2014-05-26 ENCOUNTER — Ambulatory Visit: Payer: Medicare Other

## 2014-05-26 ENCOUNTER — Telehealth: Payer: Self-pay | Admitting: Internal Medicine

## 2014-05-26 NOTE — Telephone Encounter (Signed)
per pof to r/s inj-per pof pt aware of r/s time & date

## 2014-05-27 ENCOUNTER — Ambulatory Visit (HOSPITAL_BASED_OUTPATIENT_CLINIC_OR_DEPARTMENT_OTHER): Payer: Medicare Other

## 2014-05-27 DIAGNOSIS — C3491 Malignant neoplasm of unspecified part of right bronchus or lung: Secondary | ICD-10-CM

## 2014-05-27 DIAGNOSIS — Z5189 Encounter for other specified aftercare: Secondary | ICD-10-CM

## 2014-05-27 DIAGNOSIS — C7951 Secondary malignant neoplasm of bone: Secondary | ICD-10-CM

## 2014-05-27 MED ORDER — PEGFILGRASTIM INJECTION 6 MG/0.6ML ~~LOC~~
6.0000 mg | PREFILLED_SYRINGE | Freq: Once | SUBCUTANEOUS | Status: AC
Start: 2014-05-27 — End: 2014-05-27
  Administered 2014-05-27: 6 mg via SUBCUTANEOUS

## 2014-05-27 NOTE — Patient Instructions (Signed)
Pegfilgrastim injection What is this medicine? PEGFILGRASTIM (peg fil GRA stim) is a long-acting granulocyte colony-stimulating factor that stimulates the growth of neutrophils, a type of white blood cell important in the body's fight against infection. It is used to reduce the incidence of fever and infection in patients with certain types of cancer who are receiving chemotherapy that affects the bone marrow. This medicine may be used for other purposes; ask your health care provider or pharmacist if you have questions. COMMON BRAND NAME(S): Neulasta What should I tell my health care provider before I take this medicine? They need to know if you have any of these conditions: -latex allergy -ongoing radiation therapy -sickle cell disease -skin reactions to acrylic adhesives (On-Body Injector only) -an unusual or allergic reaction to pegfilgrastim, filgrastim, other medicines, foods, dyes, or preservatives -pregnant or trying to get pregnant -breast-feeding How should I use this medicine? This medicine is for injection under the skin. If you get this medicine at home, you will be taught how to prepare and give the pre-filled syringe or how to use the On-body Injector. Refer to the patient Instructions for Use for detailed instructions. Use exactly as directed. Take your medicine at regular intervals. Do not take your medicine more often than directed. It is important that you put your used needles and syringes in a special sharps container. Do not put them in a trash can. If you do not have a sharps container, call your pharmacist or healthcare provider to get one. Talk to your pediatrician regarding the use of this medicine in children. Special care may be needed. Overdosage: If you think you have taken too much of this medicine contact a poison control center or emergency room at once. NOTE: This medicine is only for you. Do not share this medicine with others. What if I miss a dose? It is  important not to miss your dose. Call your doctor or health care professional if you miss your dose. If you miss a dose due to an On-body Injector failure or leakage, a new dose should be administered as soon as possible using a single prefilled syringe for manual use. What may interact with this medicine? Interactions have not been studied. Give your health care provider a list of all the medicines, herbs, non-prescription drugs, or dietary supplements you use. Also tell them if you smoke, drink alcohol, or use illegal drugs. Some items may interact with your medicine. This list may not describe all possible interactions. Give your health care provider a list of all the medicines, herbs, non-prescription drugs, or dietary supplements you use. Also tell them if you smoke, drink alcohol, or use illegal drugs. Some items may interact with your medicine. What should I watch for while using this medicine? You may need blood work done while you are taking this medicine. If you are going to need a MRI, CT scan, or other procedure, tell your doctor that you are using this medicine (On-Body Injector only). What side effects may I notice from receiving this medicine? Side effects that you should report to your doctor or health care professional as soon as possible: -allergic reactions like skin rash, itching or hives, swelling of the face, lips, or tongue -dizziness -fever -pain, redness, or irritation at site where injected -pinpoint red spots on the skin -shortness of breath or breathing problems -stomach or side pain, or pain at the shoulder -swelling -tiredness -trouble passing urine Side effects that usually do not require medical attention (report to your doctor   or health care professional if they continue or are bothersome): -bone pain -muscle pain This list may not describe all possible side effects. Call your doctor for medical advice about side effects. You may report side effects to FDA at  1-800-FDA-1088. Where should I keep my medicine? Keep out of the reach of children. Store pre-filled syringes in a refrigerator between 2 and 8 degrees C (36 and 46 degrees F). Do not freeze. Keep in carton to protect from light. Throw away this medicine if it is left out of the refrigerator for more than 48 hours. Throw away any unused medicine after the expiration date. NOTE: This sheet is a summary. It may not cover all possible information. If you have questions about this medicine, talk to your doctor, pharmacist, or health care provider.  2015, Elsevier/Gold Standard. (2013-08-04 16:14:05)  

## 2014-05-30 ENCOUNTER — Ambulatory Visit (HOSPITAL_BASED_OUTPATIENT_CLINIC_OR_DEPARTMENT_OTHER): Payer: Medicare Other

## 2014-05-30 ENCOUNTER — Ambulatory Visit (INDEPENDENT_AMBULATORY_CARE_PROVIDER_SITE_OTHER): Payer: Medicare Other | Admitting: Neurology

## 2014-05-30 ENCOUNTER — Other Ambulatory Visit (HOSPITAL_BASED_OUTPATIENT_CLINIC_OR_DEPARTMENT_OTHER): Payer: Medicare Other

## 2014-05-30 ENCOUNTER — Encounter: Payer: Self-pay | Admitting: Neurology

## 2014-05-30 ENCOUNTER — Telehealth: Payer: Self-pay | Admitting: *Deleted

## 2014-05-30 VITALS — BP 159/77 | HR 100 | Temp 97.8°F

## 2014-05-30 VITALS — BP 142/82 | HR 64 | Ht 65.0 in | Wt 126.0 lb

## 2014-05-30 DIAGNOSIS — Z95828 Presence of other vascular implants and grafts: Secondary | ICD-10-CM

## 2014-05-30 DIAGNOSIS — C3490 Malignant neoplasm of unspecified part of unspecified bronchus or lung: Secondary | ICD-10-CM

## 2014-05-30 DIAGNOSIS — C3491 Malignant neoplasm of unspecified part of right bronchus or lung: Secondary | ICD-10-CM

## 2014-05-30 DIAGNOSIS — Z452 Encounter for adjustment and management of vascular access device: Secondary | ICD-10-CM

## 2014-05-30 DIAGNOSIS — G20 Parkinson's disease: Secondary | ICD-10-CM

## 2014-05-30 LAB — COMPREHENSIVE METABOLIC PANEL (CC13)
ANION GAP: 11 meq/L (ref 3–11)
AST: 13 U/L (ref 5–34)
Albumin: 3.7 g/dL (ref 3.5–5.0)
Alkaline Phosphatase: 232 U/L — ABNORMAL HIGH (ref 40–150)
BUN: 29.2 mg/dL — AB (ref 7.0–26.0)
CALCIUM: 6.1 mg/dL — AB (ref 8.4–10.4)
CHLORIDE: 97 meq/L — AB (ref 98–109)
CO2: 25 meq/L (ref 22–29)
CREATININE: 0.9 mg/dL (ref 0.6–1.1)
EGFR: 60 mL/min/{1.73_m2} — ABNORMAL LOW (ref >90)
Glucose: 91 mg/dl (ref 70–140)
Potassium: 5 mEq/L (ref 3.5–5.1)
Sodium: 132 mEq/L — ABNORMAL LOW (ref 136–145)
Total Bilirubin: 1.84 mg/dL — ABNORMAL HIGH (ref 0.20–1.20)
Total Protein: 7.3 g/dL (ref 6.4–8.3)

## 2014-05-30 LAB — CBC WITH DIFFERENTIAL/PLATELET
BASO%: 0.7 % (ref 0.0–2.0)
Basophils Absolute: 0 10*3/uL (ref 0.0–0.1)
EOS ABS: 0 10*3/uL (ref 0.0–0.5)
EOS%: 0 % (ref 0.0–7.0)
HCT: 26.7 % — ABNORMAL LOW (ref 34.8–46.6)
HGB: 9 g/dL — ABNORMAL LOW (ref 11.6–15.9)
LYMPH%: 16.3 % (ref 14.0–49.7)
MCH: 29.5 pg (ref 25.1–34.0)
MCHC: 33.7 g/dL (ref 31.5–36.0)
MCV: 87.5 fL (ref 79.5–101.0)
MONO#: 0.1 10*3/uL (ref 0.1–0.9)
MONO%: 2.3 % (ref 0.0–14.0)
NEUT#: 3.6 10*3/uL (ref 1.5–6.5)
NEUT%: 80.7 % — ABNORMAL HIGH (ref 38.4–76.8)
NRBC: 0 % (ref 0–0)
PLATELETS: 94 10*3/uL — AB (ref 145–400)
RBC: 3.05 10*6/uL — AB (ref 3.70–5.45)
RDW: 16.8 % — AB (ref 11.2–14.5)
WBC: 4.4 10*3/uL (ref 3.9–10.3)
lymph#: 0.7 10*3/uL — ABNORMAL LOW (ref 0.9–3.3)

## 2014-05-30 MED ORDER — CARBIDOPA-LEVODOPA 25-100 MG PO TABS: 1.0000 | ORAL_TABLET | Freq: Three times a day (TID) | ORAL | Status: DC

## 2014-05-30 MED ORDER — SODIUM CHLORIDE 0.9 % IJ SOLN
10.0000 mL | INTRAMUSCULAR | Status: DC | PRN
  Administered 2014-05-30: 10 mL via INTRAVENOUS
  Filled 2014-05-30: qty 10

## 2014-05-30 MED ORDER — HEPARIN SOD (PORK) LOCK FLUSH 100 UNIT/ML IV SOLN
500.0000 [IU] | Freq: Once | INTRAVENOUS | Status: AC
  Administered 2014-05-30: 500 [IU] via INTRAVENOUS
  Filled 2014-05-30: qty 5

## 2014-05-30 NOTE — Progress Notes (Signed)
Subjective:   [name] was seen in consultation in the movement disorder clinic at the request of Drue Second.  Her PCP is  [name], MD.  The evaluation is for tremor.  The records that were made available to me were reviewed.  Pt is accompanied by husband and daughter who supplement the history.    The patient is a 74 y.o. right handed female with a history of tremor.  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  Tremor was minor 5 years ago in the hands and was mostly in the R hand and R leg.  Tremor 5 years ago was at rest and with use but was fairly minor.  It wasn't until until chemo started that they noted that she had tremor in the L leg.  She doesn't walk well but they attribute that to chemotherapy as well.  She had recent fall.  There have been changes in the strength of the voice but again her husband states that is fairly new.  Her walk is now slow and wobbly.  She needs help to get up and down out of the chair.  She denies any significant stiffness.  She has trouble getting to sleep and staying asleep.  No vivid dreams.  No sleep talking/acting out the dreams.  Tremor goes away only when asleep.  She was admitted on 05/13/14  For severe pancytopenia and neutropenia and required 2 units of PRBCs.  She is on ativan q 8 hours and it isn't helping a lot and is affecting to think some.  Her sister had tremor but it was attributed to a stroke.  She has no trouble swallowing.   She denies trouble with ADL's including buttoning up buttons.      Current/Previously tried tremor medications: ativan  Current medications that may exacerbate tremor:  n/a  Pt had MRI of the brain on 03/31/14 with and without gad that I did review.  It demonstrated evidence of skull mets but no brain mets.  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  There is evidence of small vessel disease and cerebral atrophy.     Outside reports reviewed: historical medical records and lab reports.  Allergies  Allergen Reactions  . Ace Inhibitors     REACTION: causing cough  . Cephalexin Rash  . Etodolac Rash  . Gabapentin Rash    Outpatient Encounter Prescriptions as of 05/30/2014  Medication Sig  . ALPRAZolam (XANAX) 0.5 MG tablet Take 0.5 mg by mouth at bedtime.    Marland Kitchen atorvastatin (LIPITOR) 80 MG tablet Take 80 mg by mouth every morning.   Marland Kitchen buPROPion (WELLBUTRIN SR) 150 MG 12 hr tablet Take 150 mg by mouth every morning.   Marland Kitchen CALCIUM PO Take 1 tablet by mouth daily.  Marland Kitchen dexlansoprazole (DEXILANT) 60 MG capsule TAKE 1 CAPSULE (60 MG TOTAL) BY MOUTH DAILY. (Patient taking differently: Take 60 mg by mouth every morning. )  . Diclofenac (ZORVOLEX) 35 MG CAPS Take 35 mg by mouth every morning.   . feeding supplement, ENSURE COMPLETE, (ENSURE COMPLETE) LIQD Take 237 mLs by mouth 2 (two) times daily between meals.  Marland Kitchen glycopyrrolate (ROBINUL) 2 MG tablet TAKE 1 TABLET (2 MG TOTAL) BY MOUTH 2 (TWO) TIMES DAILY.  . hydrochlorothiazide (HYDRODIURIL) 12.5 MG tablet Take 1 tablet by mouth every morning.   . lidocaine-prilocaine (EMLA) cream Apply 1 application topically as needed.  Apply to port 1 hr before chemo  . LORazepam (ATIVAN) 0.5 MG tablet Take 1 tablet (0.5 mg total) by mouth every 8 (eight) hours.  . metoprolol (TOPROL-XL) 50 MG 24 hr tablet Take 50 mg by mouth every morning.   Marland Kitchen oxycodone (OXY-IR) 5 MG capsule 1-2 every 4 hours as needed for back pain (Patient taking differently: Take 5-10 mg by mouth every 4 (four) hours as needed for pain. )  . Polyethylene Glycol 3350 (MIRALAX PO) Take 1 scoop by mouth daily as needed (for constipation).   . potassium chloride SA (K-DUR,KLOR-CON) 20 MEQ tablet Take 1 tablet (20 mEq total) by mouth daily.  . prochlorperazine (COMPAZINE) 10 MG tablet Take 1 tablet (10 mg total) by mouth every 6 (six) hours as needed for nausea or vomiting.    Past Medical History  Diagnosis  Date  . GERD (gastroesophageal reflux disease)   . IBS (irritable bowel syndrome)   . Adenomatous colon polyp 06/2009  . Hiatal hernia   . Breast cyst   . Diverticulosis   . Hemorrhoids   . Hyperlipidemia   . Hypertension   . Hyperparathyroidism   . Anxiety   . Migraine   . Osteoporosis   . Vitamin D deficiency   . Spinal stenosis   . C. difficile colitis 2006  . Small cell lung carcinoma dx'd 02/2014  . Bone metastases dx'd 02/2014    Past Surgical History  Procedure Laterality Date  . Cholecystectomy    . Vaginal hysterectomy    . Knee arthroscopy      left  . Parathyroidectomy  2009    [facility]  . Lumbar spine surgery  2001  . Breast biopsy      bilateral  . Total knee arthroplasty      right  . Bladder repair    . Video bronchoscopy Bilateral 03/14/2014    Procedure: VIDEO BRONCHOSCOPY WITHOUT FLUORO;  Surgeon: Tanda Rockers, MD;  Location: [facility];  Service: Cardiopulmonary;  Laterality: Bilateral;    History   Social History  . Marital Status: Married    Spouse Name: N/A    Number of Children: N/A  . Years of Education: N/A   Occupational History  . Not on file.   Social History Main Topics  . Smoking status: Former Smoker -- 0.25 packs/day for 25 years    Types: Cigarettes    Quit date: 05/19/2013  . Smokeless tobacco: Never Used  . Alcohol Use: No  . Drug Use: No  . Sexual Activity: Not on file   Other Topics Concern  . Not on file   Social History Narrative   Lives with husband.   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    Family Status  Relation Status Death Age  . Mother Deceased     murdered  . Father Deceased     murdered  . Sister Alive     DM, Breast Cancer  . Sister Alive     DM, stroke  . Sister Alive     DM  . Brother Alive     DM  . Brother Deceased     MI, DM  . Brother Deceased     unknown  . Daughter Alive     MS  . Brother Alive     healthy    Review of Systems A complete 10  system ROS was obtained and was negative apart from what is mentioned.   Objective:   VITALS:  Filed Vitals:   05/30/14 0857  BP: 142/82  Pulse: 64  Height: 5\' 5"  (1.651 m)  Weight: 126 lb (57.153 kg)   Gen:  Appears stated age and in NAD.  Ill appearing.   HEENT:  Normocephalic, atraumatic. The mucous membranes are moist. The superficial temporal arteries are without ropiness or tenderness. Cardiovascular: Regular rate and rhythm. Lungs: Clear to auscultation bilaterally. Neck: There are no carotid bruits noted bilaterally.  NEUROLOGICAL:  Orientation: The patient is alert and oriented x3. Fund of knowledge is appropriate.  Recent and remote memory are intact.  Attention and concentration are normal.    Able to name objects and repeat phrases. Cranial nerves: There is good facial symmetry. Pupils are equal round and reactive to light bilaterally. Fundoscopic exam is attempted but the disc margins are not well visualized bilaterally. Extraocular muscles are intact. The visual fields are full to confrontational testing. The speech is fluent and clear. Soft palate rises symmetrically and there is no tongue deviation. Hearing is intact to conversational tone. Sensation: Sensation is intact to light and pinprick throughout (facial, trunk, extremities). Vibration is absent at the bilateral big toe. There is no extinction with double simultaneous stimulation. There is no sensory dermatomal level identified. Motor: Strength is 4/5 in the bilateral upper and lower extremities.   Shoulder shrug is equal and symmetric.  There is no pronator drift. Deep tendon reflexes: Deep tendon reflexes are 1/4 at the bilateral biceps, triceps, brachioradialis, patella and achilles. Plantar responses are downgoing bilaterally.  Movement examination: Tone: There is mild increased tone in the right upper extremity.  There is normal tone in the LUE.  Marland Kitchen  The tone in the lower extremities is normal Abnormal  movements: There is severe resting tremor of the right upper and Left lower extremity.  Coordination:  There is decremation with RAM's, with virtually all formal of RAM's bilaterally, including alternating supination and pronation of the forearm, hand opening and closing, finger taps, heel taps and toe taps.  The arms are worse than the legs.   Gait and Station: The patient has  difficulty arising out of a deep-seated chair without the use of the hands. The patient's stride length is decreased and the L leg sticks to the ground.  She turns en bloc.           Assessment/Plan:   1.  Parkinsons disease  -sounds like she has had sx's for 5 years (RUE/RLE resting tremor) but that sx's dramatically increased in November when started chemotherapy.  They noted great increase in tremor then, but she does meet clinical criteria for dx of PD.    -start carbidopa/levodopa 25/100, 1/2 tid for a week and then increase to 1 po tid.  Risks, benefits, side effects and alternative therapies were discussed.  The opportunity to ask questions was given and they were answered to the best of my ability.  The patient expressed understanding and willingness to follow the outlined treatment protocols.  -talked to them about fact that levodopa should help gait.  Is possible that it won't help tremor but hopefully it will and they want to try.  Avoid protein at same time as levodopa.  -prefer zofran for nausea but compazine definititely better than phenergan  -Talked to them about home PT but don't want right now 2.  Stage 4 small cell  -still undergoing chemo which is obviously contributing to weakness  -had severe neutropenia and pancytopenia.  Feels that did not get stronger with PRBC infusion.  3.  Follow up 8 weeks, call if issues before then.  Greater than 50% of 60 min in counseling re: safety and dx.

## 2014-05-30 NOTE — Patient Instructions (Addendum)
1. Start Carbidopa Levodopa as follows: 1/2 tab three times a day before meals x 1 wk, then 1/2 in am & noon & 1 in evening for a week, then 1/2 in am &1 at noon &one in evening for a week, then 1 tablet three times a day before meals.

## 2014-05-30 NOTE — Telephone Encounter (Signed)
Ca 6.1.  Per Dr Vista Mink, pt needs to take calcium supplement 1200 mg daily.  Called pt's home # and spoke to pt's daughter [name] per pt request.  She will inform patient.

## 2014-05-30 NOTE — Patient Instructions (Signed)

## 2014-06-01 ENCOUNTER — Other Ambulatory Visit: Payer: Self-pay | Admitting: Nurse Practitioner

## 2014-06-01 DIAGNOSIS — C3400 Malignant neoplasm of unspecified main bronchus: Secondary | ICD-10-CM

## 2014-06-01 DIAGNOSIS — E876 Hypokalemia: Secondary | ICD-10-CM

## 2014-06-06 ENCOUNTER — Ambulatory Visit (HOSPITAL_COMMUNITY): Admission: RE | Admit: 2014-06-06 | Payer: Medicare Other | Source: Ambulatory Visit

## 2014-06-06 ENCOUNTER — Other Ambulatory Visit: Payer: Medicare Other

## 2014-06-08 ENCOUNTER — Ambulatory Visit (HOSPITAL_COMMUNITY)
Admission: RE | Admit: 2014-06-08 | Discharge: 2014-06-08 | Disposition: A | Discharge status: Home / Self Care | Payer: Medicare Other | Source: Ambulatory Visit | Attending: Physician Assistant | Admitting: Physician Assistant

## 2014-06-08 DIAGNOSIS — C3490 Malignant neoplasm of unspecified part of unspecified bronchus or lung: Secondary | ICD-10-CM | POA: Diagnosis not present

## 2014-06-08 DIAGNOSIS — C7951 Secondary malignant neoplasm of bone: Secondary | ICD-10-CM | POA: Diagnosis not present

## 2014-06-08 DIAGNOSIS — C3491 Malignant neoplasm of unspecified part of right bronchus or lung: Secondary | ICD-10-CM

## 2014-06-08 DIAGNOSIS — K5710 Diverticulosis of small intestine without perforation or abscess without bleeding: Secondary | ICD-10-CM | POA: Diagnosis not present

## 2014-06-08 DIAGNOSIS — R109 Unspecified abdominal pain: Secondary | ICD-10-CM | POA: Insufficient documentation

## 2014-06-08 DIAGNOSIS — M4854XA Collapsed vertebra, not elsewhere classified, thoracic region, initial encounter for fracture: Secondary | ICD-10-CM | POA: Insufficient documentation

## 2014-06-08 MED ORDER — IOHEXOL 300 MG/ML  SOLN
100.0000 mL | Freq: Once | INTRAMUSCULAR | Status: AC | PRN
  Administered 2014-06-08: 100 mL via INTRAVENOUS

## 2014-06-13 ENCOUNTER — Other Ambulatory Visit (HOSPITAL_BASED_OUTPATIENT_CLINIC_OR_DEPARTMENT_OTHER): Payer: Medicare Other

## 2014-06-13 ENCOUNTER — Ambulatory Visit: Payer: Medicare Other

## 2014-06-13 ENCOUNTER — Ambulatory Visit: Payer: Medicare Other | Admitting: Nutrition

## 2014-06-13 ENCOUNTER — Ambulatory Visit (HOSPITAL_COMMUNITY)
Admission: RE | Admit: 2014-06-13 | Discharge: 2014-06-13 | Disposition: A | Discharge status: Home / Self Care | Payer: Medicare Other | Source: Ambulatory Visit | Attending: Internal Medicine | Admitting: Internal Medicine

## 2014-06-13 ENCOUNTER — Encounter: Payer: Self-pay | Admitting: Physician Assistant

## 2014-06-13 ENCOUNTER — Ambulatory Visit (HOSPITAL_BASED_OUTPATIENT_CLINIC_OR_DEPARTMENT_OTHER): Payer: Medicare Other

## 2014-06-13 ENCOUNTER — Ambulatory Visit (HOSPITAL_BASED_OUTPATIENT_CLINIC_OR_DEPARTMENT_OTHER): Payer: Medicare Other | Admitting: Physician Assistant

## 2014-06-13 VITALS — BP 110/51 | HR 83 | Temp 98.3°F | Resp 18 | Ht 65.0 in | Wt 120.0 lb

## 2014-06-13 VITALS — BP 124/69 | HR 85 | Temp 98.4°F | Resp 16

## 2014-06-13 DIAGNOSIS — E785 Hyperlipidemia, unspecified: Secondary | ICD-10-CM | POA: Insufficient documentation

## 2014-06-13 DIAGNOSIS — E559 Vitamin D deficiency, unspecified: Secondary | ICD-10-CM | POA: Insufficient documentation

## 2014-06-13 DIAGNOSIS — I10 Essential (primary) hypertension: Secondary | ICD-10-CM | POA: Insufficient documentation

## 2014-06-13 DIAGNOSIS — C7951 Secondary malignant neoplasm of bone: Secondary | ICD-10-CM

## 2014-06-13 DIAGNOSIS — Z79899 Other long term (current) drug therapy: Secondary | ICD-10-CM | POA: Diagnosis not present

## 2014-06-13 DIAGNOSIS — M810 Age-related osteoporosis without current pathological fracture: Secondary | ICD-10-CM | POA: Diagnosis not present

## 2014-06-13 DIAGNOSIS — D6481 Anemia due to antineoplastic chemotherapy: Secondary | ICD-10-CM | POA: Diagnosis present

## 2014-06-13 DIAGNOSIS — T451X5A Adverse effect of antineoplastic and immunosuppressive drugs, initial encounter: Principal | ICD-10-CM

## 2014-06-13 DIAGNOSIS — C3490 Malignant neoplasm of unspecified part of unspecified bronchus or lung: Secondary | ICD-10-CM

## 2014-06-13 DIAGNOSIS — C3491 Malignant neoplasm of unspecified part of right bronchus or lung: Secondary | ICD-10-CM

## 2014-06-13 DIAGNOSIS — Z95828 Presence of other vascular implants and grafts: Secondary | ICD-10-CM

## 2014-06-13 LAB — CBC WITH DIFFERENTIAL/PLATELET
BASO%: 0.2 % (ref 0.0–2.0)
BASOS ABS: 0 10*3/uL (ref 0.0–0.1)
EOS ABS: 0 10*3/uL (ref 0.0–0.5)
EOS%: 0.1 % (ref 0.0–7.0)
HEMATOCRIT: 23 % — AB (ref 34.8–46.6)
HGB: 7.3 g/dL — ABNORMAL LOW (ref 11.6–15.9)
LYMPH%: 12.9 % — AB (ref 14.0–49.7)
MCH: 29.6 pg (ref 25.1–34.0)
MCHC: 31.7 g/dL (ref 31.5–36.0)
MCV: 93.1 fL (ref 79.5–101.0)
MONO#: 1.2 10*3/uL — ABNORMAL HIGH (ref 0.1–0.9)
MONO%: 13.2 % (ref 0.0–14.0)
NEUT%: 73.6 % (ref 38.4–76.8)
NEUTROS ABS: 6.6 10*3/uL — AB (ref 1.5–6.5)
PLATELETS: 205 10*3/uL (ref 145–400)
RBC: 2.47 10*6/uL — ABNORMAL LOW (ref 3.70–5.45)
RDW: 20.3 % — AB (ref 11.2–14.5)
WBC: 8.9 10*3/uL (ref 3.9–10.3)
lymph#: 1.2 10*3/uL (ref 0.9–3.3)

## 2014-06-13 LAB — COMPREHENSIVE METABOLIC PANEL (CC13)
ALBUMIN: 3.5 g/dL (ref 3.5–5.0)
ALT: <6 U/L (ref 0–55)
AST: 10 U/L (ref 5–34)
Alkaline Phosphatase: 153 U/L — ABNORMAL HIGH (ref 40–150)
Anion Gap: 11 mEq/L (ref 3–11)
BILIRUBIN TOTAL: 1 mg/dL (ref 0.20–1.20)
BUN: 16.2 mg/dL (ref 7.0–26.0)
CALCIUM: 6.1 mg/dL — AB (ref 8.4–10.4)
CO2: 27 mEq/L (ref 22–29)
Chloride: 101 mEq/L (ref 98–109)
Creatinine: 0.8 mg/dL (ref 0.6–1.1)
EGFR: 73 mL/min/{1.73_m2} — ABNORMAL LOW (ref >90)
GLUCOSE: 101 mg/dL (ref 70–140)
Potassium: 4.2 mEq/L (ref 3.5–5.1)
Sodium: 139 mEq/L (ref 136–145)
TOTAL PROTEIN: 6.6 g/dL (ref 6.4–8.3)

## 2014-06-13 LAB — PREPARE RBC (CROSSMATCH)

## 2014-06-13 MED ORDER — SODIUM CHLORIDE 0.9 % IJ SOLN
10.0000 mL | INTRAMUSCULAR | Status: AC | PRN
  Administered 2014-06-13: 10 mL
  Filled 2014-06-13: qty 10

## 2014-06-13 MED ORDER — ACETAMINOPHEN 325 MG PO TABS
ORAL_TABLET | ORAL | Status: AC
  Filled 2014-06-13: qty 2

## 2014-06-13 MED ORDER — HEPARIN SOD (PORK) LOCK FLUSH 100 UNIT/ML IV SOLN
500.0000 [IU] | Freq: Every day | INTRAVENOUS | Status: AC | PRN
  Administered 2014-06-13: 500 [IU]
  Filled 2014-06-13: qty 5

## 2014-06-13 MED ORDER — SODIUM CHLORIDE 0.9 % IV SOLN
250.0000 mL | Freq: Once | INTRAVENOUS | Status: AC
  Administered 2014-06-13: 250 mL via INTRAVENOUS

## 2014-06-13 MED ORDER — SODIUM CHLORIDE 0.9 % IJ SOLN
10.0000 mL | INTRAMUSCULAR | Status: DC | PRN
  Administered 2014-06-13: 10 mL via INTRAVENOUS
  Filled 2014-06-13: qty 10

## 2014-06-13 MED ORDER — DIPHENHYDRAMINE HCL 25 MG PO CAPS
ORAL_CAPSULE | ORAL | Status: AC
  Filled 2014-06-13: qty 1

## 2014-06-13 MED ORDER — DIPHENHYDRAMINE HCL 25 MG PO CAPS
25.0000 mg | ORAL_CAPSULE | Freq: Once | ORAL | Status: AC
  Administered 2014-06-13: 25 mg via ORAL

## 2014-06-13 MED ORDER — ACETAMINOPHEN 325 MG PO TABS
650.0000 mg | ORAL_TABLET | Freq: Once | ORAL | Status: AC
  Administered 2014-06-13: 650 mg via ORAL

## 2014-06-13 NOTE — Patient Instructions (Signed)

## 2014-06-13 NOTE — Patient Instructions (Signed)

## 2014-06-13 NOTE — Progress Notes (Signed)
Nutrition follow-up completed with patient in chemotherapy.   Patient reports continued nausea and poor appetite. Weight decreased significantly and documented as 120 pounds January 26, down from 130.9 pounds December 26.  Nutrition diagnosis:  Inadequate oral intake continues.  Intervention: Reviewed high-calorie high-protein meals and snacks with patient. Recommended she take nausea medications as prescribed to increase oral intake. Recommended patient consume Ensure Plus as tolerated. Offered coupons and nutrition samples.  However, patient declined at this time. Teach back method used.  Monitoring, evaluation, goals: Patient will increase calories and protein to minimize further weight loss.  Next visit: Will continue to work with patient as needed.  **Disclaimer: This note was dictated with voice recognition software. Similar sounding words can inadvertently be transcribed and this note may contain transcription errors which may not have been corrected upon publication of note.**

## 2014-06-13 NOTE — Progress Notes (Signed)
Quick Note:  Call patient with the result and start her on over-the-counter calcium supplement 600 mg twice a day ______

## 2014-06-13 NOTE — Progress Notes (Signed)
Called in [place] for pt for 2 units of blood

## 2014-06-13 NOTE — Progress Notes (Addendum)
[place] [phone]   [phone]  OFFICE PROGRESS NOTE  [name], MD [address] [place] [address]  DIAGNOSIS: Extensive stage, stage IV (T3, N2, M1b) small cell lung cancer diagnosed in October 2015.  PRIOR THERAPY: None  CURRENT THERAPY: Systemic chemotherapy with carboplatin for AUC of 5 on day 1 and etoposide 100 MG/M2 on days 1, 2 and 3 with Neulasta support on day 4.  Starting with cycle #4 her carboplatin will be for an AUC of 4 given on day 1 and etoposide at 80 mg/m given on days 1, 2 and 3 with Neulasta support given on day 4. Status post 4 cycles.  INTERVAL HISTORY: [name] 74 y.o. female returns to the clinic today for follow-up visit accompanied by her husband. She tolerated the last cycle of her systemic chemotherapy with carboplatin and etoposide fairly well except for increased fatigue. She denied having any significant nausea or vomiting, no fever or chills. The patient denied having any significant chest pain but continues to have shortness breath with exertion was no cough or hemoptysis.  She  presents for reevaluation of her laboratory data as well as to proceed with cycle #5 of her dose reduced systemic chemotherapy with carboplatin and etoposide with Neulasta support. She also recently had a restaging CT scan of the chest, abdomen and pelvis and presents to discuss the results.  MEDICAL HISTORY: Past Medical History  Diagnosis Date  . GERD (gastroesophageal reflux disease)   . IBS (irritable bowel syndrome)   . Adenomatous colon polyp 06/2009  . Hiatal hernia   . Breast cyst   . Diverticulosis   . Hemorrhoids   . Hyperlipidemia   . Hypertension   . Hyperparathyroidism   . Anxiety   . Migraine   . Osteoporosis   . Vitamin D deficiency   . Spinal stenosis   . C. difficile colitis 2006  . Small cell lung carcinoma dx'd 02/2014  . Bone metastases dx'd 02/2014    ALLERGIES:  is allergic to ace  inhibitors; cephalexin; etodolac; and gabapentin.  MEDICATIONS:  Current Outpatient Prescriptions  Medication Sig Dispense Refill  . ALPRAZolam (XANAX) 0.5 MG tablet Take 0.5 mg by mouth at bedtime.      Marland Kitchen atorvastatin (LIPITOR) 80 MG tablet Take 80 mg by mouth every morning.     Marland Kitchen buPROPion (WELLBUTRIN SR) 150 MG 12 hr tablet Take 150 mg by mouth every morning.     Marland Kitchen CALCIUM PO Take 1 tablet by mouth daily.    . carbidopa-levodopa (SINEMET IR) 25-100 MG per tablet Take 1 tablet by mouth 3 (three) times daily. 90 tablet 3  . dexlansoprazole (DEXILANT) 60 MG capsule TAKE 1 CAPSULE (60 MG TOTAL) BY MOUTH DAILY. (Patient taking differently: Take 60 mg by mouth every morning. ) 30 capsule 11  . Diclofenac (ZORVOLEX) 35 MG CAPS Take 35 mg by mouth every morning.     . feeding supplement, ENSURE COMPLETE, (ENSURE COMPLETE) LIQD Take 237 mLs by mouth 2 (two) times daily between meals. 237 mL 0  . glycopyrrolate (ROBINUL) 2 MG tablet TAKE 1 TABLET (2 MG TOTAL) BY MOUTH 2 (TWO) TIMES DAILY. 60 tablet 11  . hydrochlorothiazide (HYDRODIURIL) 12.5 MG tablet Take 1 tablet by mouth every morning.     Marland Kitchen KLOR-CON M20 20 MEQ tablet TAKE 1 TABLET BY MOUTH EVERY DAY 30 tablet 1  . lidocaine-prilocaine (EMLA) cream Apply 1 application topically as needed. Apply to port 1  hr before chemo 30 g 0  . LORazepam (ATIVAN) 0.5 MG tablet Take 1 tablet (0.5 mg total) by mouth every 8 (eight) hours. 30 tablet 0  . metoprolol (TOPROL-XL) 50 MG 24 hr tablet Take 50 mg by mouth every morning.     Marland Kitchen oxycodone (OXY-IR) 5 MG capsule 1-2 every 4 hours as needed for back pain (Patient taking differently: Take 5-10 mg by mouth every 4 (four) hours as needed for pain. ) 90 capsule 0  . Polyethylene Glycol 3350 (MIRALAX PO) Take 1 scoop by mouth daily as needed (for constipation).     . prochlorperazine (COMPAZINE) 10 MG tablet Take 1 tablet (10 mg total) by mouth every 6 (six) hours as needed for nausea or vomiting. 30 tablet 1   No  current facility-administered medications for this visit.   Facility-Administered Medications Ordered in Other Visits  Medication Dose Route Frequency Provider Last Rate Last Dose  . heparin lock flush 100 unit/mL  500 Units Intracatheter Daily PRN Christal Lagerstrom E Amay Mijangos, PA-C      . sodium chloride 0.9 % injection 10 mL  10 mL Intracatheter PRN Carlton Adam, PA-C        SURGICAL HISTORY:  Past Surgical History  Procedure Laterality Date  . Cholecystectomy    . Vaginal hysterectomy    . Knee arthroscopy      left  . Parathyroidectomy  2009    [facility]  . Lumbar spine surgery  2001  . Breast biopsy      bilateral  . Total knee arthroplasty      right  . Bladder repair    . Video bronchoscopy Bilateral 03/14/2014    Procedure: VIDEO BRONCHOSCOPY WITHOUT FLUORO;  Surgeon: Tanda Rockers, MD;  Location: [facility];  Service: Cardiopulmonary;  Laterality: Bilateral;    REVIEW OF SYSTEMS:  Constitutional: positive for anorexia, fatigue and weight loss Eyes: negative Ears, nose, mouth, throat, and face: negative Respiratory: positive for dyspnea on exertion Cardiovascular: negative Gastrointestinal: negative Genitourinary:negative Integument/breast: negative Hematologic/lymphatic: negative Musculoskeletal:negative Neurological: positive for tremors Behavioral/Psych: negative Endocrine: negative Allergic/Immunologic: negative   PHYSICAL EXAMINATION: General appearance: alert, cooperative, fatigued and no distress Head: Normocephalic, without obvious abnormality, atraumatic Neck: no adenopathy, no JVD, supple, symmetrical, trachea midline and thyroid not enlarged, symmetric, no tenderness/mass/nodules Lymph nodes: Cervical, supraclavicular, and axillary nodes normal. Resp: clear to auscultation bilaterally Back: symmetric, no curvature. ROM normal. No CVA tenderness. Cardio: regular rate and rhythm, S1, S2 normal, no murmur, click, rub or gallop GI: soft, non-tender;  bowel sounds normal; no masses,  no organomegaly Extremities: extremities normal, atraumatic, no cyanosis or edema Neurologic: Grossly normal  ECOG PERFORMANCE STATUS: 1 - Symptomatic but completely ambulatory  Blood pressure 110/51, pulse 83, temperature 98.3 F (36.8 C), temperature source Oral, resp. rate 18, height 5\' 5"  (1.651 m), weight 120 lb (54.432 kg).  LABORATORY DATA: Lab Results  Component Value Date   WBC 8.9 06/13/2014   HGB 7.3* 06/13/2014   HCT 23.0* 06/13/2014   MCV 93.1 06/13/2014   PLT 205 06/13/2014      Chemistry      Component Value Date/Time   NA 139 06/13/2014 1150   NA 136 05/17/2014 0455   K 4.2 06/13/2014 1150   K 3.4* 05/17/2014 0455   CL 95* 05/17/2014 0455   CO2 27 06/13/2014 1150   CO2 32 05/17/2014 0455   BUN 16.2 06/13/2014 1150   BUN 6 05/17/2014 0455   CREATININE 0.8 06/13/2014 1150  CREATININE 0.78 05/17/2014 0455      Component Value Date/Time   CALCIUM 6.1* 06/13/2014 1150   CALCIUM 7.7* 05/17/2014 0455   ALKPHOS 153* 06/13/2014 1150   ALKPHOS 232* 05/13/2014 1605   AST 10 06/13/2014 1150   AST 15 05/13/2014 1605   ALT <6 06/13/2014 1150   ALT 10 05/13/2014 1605   BILITOT 1.00 06/13/2014 1150   BILITOT 1.1 05/13/2014 1605       RADIOGRAPHIC STUDIES: Ct Chest W Contrast  06/08/2014   CLINICAL DATA:  Small cell lung cancer. Bone metastasis. Subsequent treatment evaluation. Chemotherapy ongoing. Right flank pain.  EXAM: CT CHEST, ABDOMEN, AND PELVIS WITH CONTRAST  TECHNIQUE: Multidetector CT imaging of the chest, abdomen and pelvis was performed following the standard protocol during bolus administration of intravenous contrast.  CONTRAST:  145mL OMNIPAQUE IOHEXOL 300 MG/ML  SOLN  COMPARISON:  CT 05/01/2014, PET-CT 03/31/2014, CT 417 1,000 and  FINDINGS: CT CHEST FINDINGS  Mediastinum/Nodes: No axillary supraclavicular adenopathy. Rounded right lower paratracheal lymph node measures 10 mm short axis decreased from 14 mm on prior.  Right hilar lymph node measures 13 mm decreased from 18 mm. Subcarinal lymph node measures 10 mm decreased 15 mm. No pericardial fluid. No central embolism. Esophagus normal.  Lungs/Pleura: There is continued decreased in size of the right middle lobe consolidation along the mediastinal border which now only represents mild atelectasis with previously measurable lesion. No new pulmonary nodularity. Paraseptal emphysema noted at the lung bases  CT ABDOMEN AND PELVIS FINDINGS  Hepatobiliary: No focal hepatic bleeding known. There is fatty infiltration falciform ligament. Postcholecystectomy.  Pancreas: Along right border of pancreatic head there is some thickening adjacent to the duodenum (image 65) which is similar to comparison CT 2010. Body and tail of pancreas are normal.  Spleen: Normal spleen.  Adrenals/Urinary Tract: Mild adrenal gland thickening similar prior. There is bilateral renal cortical thinning. There are bilateral renal cysts again demonstrated. Ureters bladder normal.  Stomach/Bowel: Stomach is normal. There is a large duodenum diverticulum in second portion duodenum. The small bowel and cecum are normal. The colon and rectosigmoid colon are normal. Rectum is collapsed with evaluated.  Vascular/Lymphatic: Abdominal aorta is normal caliber with atherosclerotic calcification. There is no retroperitoneal or periportal lymphadenopathy. No pelvic lymphadenopathy.  Reproductive: Post hysterectomy anatomy.  Musculoskeletal: Sclerotic lesion in the right iliac bone is noted adjacent SI joint and unchanged. Lytic lesion in the right iliac wing is again demonstrated. Sclerotic lesion T12 again demonstrated. Compression fracture T9 again demonstrated. Sclerotic sternal lesions again noted. No new or progressive skeletal metastasis  IMPRESSION: Chest Impression:  1. Decrease in right hilar mass and mediastinal lymph node size. 2. No new pulmonary nodules or evidence disease progression the thorax.  Abdomen /  Pelvis Impression:  1. No evidence metastasis the soft tissues of the abdomen or pelvis. 2. Stable thickening at the junction pancreas and the duodenum compared back to 2010. 3. Stable widespread skeletal metastasis with no new or progressive lesions. Compression fracture at T9 unchanged.   Electronically Signed   By: Suzy Bouchard M.D.   On: 06/08/2014 12:03   Ct Abdomen Pelvis W Contrast  06/08/2014   CLINICAL DATA:  Small cell lung cancer. Bone metastasis. Subsequent treatment evaluation. Chemotherapy ongoing. Right flank pain.  EXAM: CT CHEST, ABDOMEN, AND PELVIS WITH CONTRAST  TECHNIQUE: Multidetector CT imaging of the chest, abdomen and pelvis was performed following the standard protocol during bolus administration of intravenous contrast.  CONTRAST:  149mL OMNIPAQUE IOHEXOL 300  MG/ML  SOLN  COMPARISON:  CT 05/01/2014, PET-CT 03/31/2014, CT 417 1,000 and  FINDINGS: CT CHEST FINDINGS  Mediastinum/Nodes: No axillary supraclavicular adenopathy. Rounded right lower paratracheal lymph node measures 10 mm short axis decreased from 14 mm on prior. Right hilar lymph node measures 13 mm decreased from 18 mm. Subcarinal lymph node measures 10 mm decreased 15 mm. No pericardial fluid. No central embolism. Esophagus normal.  Lungs/Pleura: There is continued decreased in size of the right middle lobe consolidation along the mediastinal border which now only represents mild atelectasis with previously measurable lesion. No new pulmonary nodularity. Paraseptal emphysema noted at the lung bases  CT ABDOMEN AND PELVIS FINDINGS  Hepatobiliary: No focal hepatic bleeding known. There is fatty infiltration falciform ligament. Postcholecystectomy.  Pancreas: Along right border of pancreatic head there is some thickening adjacent to the duodenum (image 65) which is similar to comparison CT 2010. Body and tail of pancreas are normal.  Spleen: Normal spleen.  Adrenals/Urinary Tract: Mild adrenal gland thickening similar prior.  There is bilateral renal cortical thinning. There are bilateral renal cysts again demonstrated. Ureters bladder normal.  Stomach/Bowel: Stomach is normal. There is a large duodenum diverticulum in second portion duodenum. The small bowel and cecum are normal. The colon and rectosigmoid colon are normal. Rectum is collapsed with evaluated.  Vascular/Lymphatic: Abdominal aorta is normal caliber with atherosclerotic calcification. There is no retroperitoneal or periportal lymphadenopathy. No pelvic lymphadenopathy.  Reproductive: Post hysterectomy anatomy.  Musculoskeletal: Sclerotic lesion in the right iliac bone is noted adjacent SI joint and unchanged. Lytic lesion in the right iliac wing is again demonstrated. Sclerotic lesion T12 again demonstrated. Compression fracture T9 again demonstrated. Sclerotic sternal lesions again noted. No new or progressive skeletal metastasis  IMPRESSION: Chest Impression:  1. Decrease in right hilar mass and mediastinal lymph node size. 2. No new pulmonary nodules or evidence disease progression the thorax.  Abdomen / Pelvis Impression:  1. No evidence metastasis the soft tissues of the abdomen or pelvis. 2. Stable thickening at the junction pancreas and the duodenum compared back to 2010. 3. Stable widespread skeletal metastasis with no new or progressive lesions. Compression fracture at T9 unchanged.   Electronically Signed   By: Suzy Bouchard M.D.   On: 06/08/2014 12:03    ASSESSMENT AND PLAN: This is a very pleasant 74 years old white female with:  1) Extensive stage small cell lung cancer currently undergoing systemic chemotherapy with carboplatin and etoposide status post 4 cycles. The patient is tolerating her treatment fairly well except for mild fatigue, alopecia and new tremors.  The last CT scan of the chest showed further improvement in her disease in the chest with decrease in the right hilar mass and mediastinal lymph node size. There is no new pulmonary  nodules or evidence of disease progression in the thorax. There is no evidence of metastasis in soft tissues of the abdomen or pelvis and stable widespread skeletal metastasis with no new or progressive lesions. The compression fracture T9 was unchanged. She is currently on treatment with Xgeva. The patient was discussed with and also seen by Dr. Julien Nordmann. She is anemic today with a hemoglobin of 7.3. We will postpone cycle #5 x 1 week and have her receive 2 units of packed red blood cells today to address with the chemotherapy-induced anemia. She will return in one week to proceed with cycle #5 at the dose reduction as follows: Carboplatin to an AUC of 4 and etoposide to 80 mg/m   2)  metastatic bone disease: The patient will continue on Xgeva 120 mcg subcutaneously every 4 weeks. The patient has no dental issues and currently has dentures. She was also advised to take calcium and vitamin D supplements on daily basis.  3) chemotherapy-induced anemia, her hemoglobin is 7.3 today. She'll receive 2 units of packed red blood cells to address the chemotherapy-induced anemia.  4) For the tremors, she will continue on Ativan 0.5 mg by mouth every 8 hours as needed.   5) Hypokalemia: Resolved   She will follow-up in 4 weeks prior to the start of cycle #6.   The patient was advised to call immediately if she has any concerning symptoms in the interval. The patient voices understanding of current disease status and treatment options and is in agreement with the current care plan.  All questions were answered. The patient knows to call the clinic with any problems, questions or concerns. We can certainly see the patient much sooner if necessary.  Disclaimer: This note was dictated with voice recognition software. Similar sounding words can inadvertently be transcribed and may not be corrected upon review. Carlton Adam, PA-C 06/13/2014  ADDENDUM: Hematology/Oncology Attending: I had a face to face  encounter with the patient. I recommended her care plan. This is a very pleasant 74 years old white female with extensive stage small cell lung cancer, currently undergoing systemic chemotherapy with carboplatin and etoposide status post 4 cycles. She is tolerating her treatment well except for the pancytopenia. The recent CT scan of the chest, abdomen and pelvis showed further improvement in her disease. I discussed the scan results with the patient and her daughter. I recommended for her to continue with 2 more cycles of her systemic chemotherapy with carboplatin and etoposide. We will delay the start of cycle #5 by 1 week to allow the patient to recover from the recent adverse effect of the previous treatment. For the chemotherapy-induced anemia, we will arrange for the patient to receive 2 units of PRBCs transfusion. The patient would come back for follow-up visit in 4 weeks with the start of cycle #6. She was advised to call immediately if she has any concerning symptoms in the interval.  Disclaimer: This note was dictated with voice recognition software. Similar sounding words can inadvertently be transcribed and may be missed upon review. Eilleen Kempf., MD 06/17/2014

## 2014-06-14 ENCOUNTER — Telehealth: Payer: Self-pay | Admitting: Medical Oncology

## 2014-06-14 ENCOUNTER — Ambulatory Visit: Payer: Medicare Other

## 2014-06-14 ENCOUNTER — Telehealth: Payer: Self-pay | Admitting: Internal Medicine

## 2014-06-14 ENCOUNTER — Other Ambulatory Visit: Payer: Self-pay

## 2014-06-14 LAB — TYPE AND SCREEN
ABO/RH(D): O POS
Antibody Screen: NEGATIVE
Unit division: 0
Unit division: 0

## 2014-06-14 NOTE — Telephone Encounter (Signed)
Called and informed pt calcium was low. Pt states she takes 600mg  once a day right now. Informed pt to increase it to twice daily. Pt verbalized understanding and denies any further questions or concerns at this time.

## 2014-06-14 NOTE — Telephone Encounter (Signed)
-----   Message from Carlton Adam, [place] sent at 06/14/2014 12:02 PM EST ----- Abnormal results, please call and notify patient to start taking over the counter Calcium 600 mg by mouth twice a day

## 2014-06-14 NOTE — Telephone Encounter (Signed)
I left message on pt phone.

## 2014-06-14 NOTE — Telephone Encounter (Signed)
-----   Message from [name], [place] sent at 06/14/2014 12:02 PM EST ----- Abnormal results, please call and notify patient to start taking over the counter Calcium 600 mg by mouth twice a day

## 2014-06-14 NOTE — Telephone Encounter (Signed)
lvm for pt regarding to Feb appts....sed added tx.

## 2014-06-15 ENCOUNTER — Telehealth: Payer: Self-pay | Admitting: *Deleted

## 2014-06-15 ENCOUNTER — Ambulatory Visit: Payer: Medicare Other

## 2014-06-15 NOTE — Telephone Encounter (Signed)
Patient's husband called regarding appts for next week.  I have given him the appts for 2/3

## 2014-06-16 ENCOUNTER — Ambulatory Visit: Payer: Medicare Other

## 2014-06-16 NOTE — Patient Instructions (Signed)
Your restaging CT scan revealed further improvement in your disease with no evidence of new or progressive disease Your hemoglobin is low and we will arrange for you to receive 2 units of blood today Cycle #5 of your chemotherapy will be postponed by 1 week Continue weekly labs as scheduled Follow-up in 4 weeks prior to cycle #6 of your chemotherapy

## 2014-06-21 ENCOUNTER — Ambulatory Visit: Payer: Medicare Other

## 2014-06-21 ENCOUNTER — Telehealth: Payer: Self-pay | Admitting: *Deleted

## 2014-06-21 ENCOUNTER — Ambulatory Visit (HOSPITAL_BASED_OUTPATIENT_CLINIC_OR_DEPARTMENT_OTHER): Payer: Medicare Other

## 2014-06-21 ENCOUNTER — Other Ambulatory Visit (HOSPITAL_BASED_OUTPATIENT_CLINIC_OR_DEPARTMENT_OTHER): Payer: Medicare Other

## 2014-06-21 DIAGNOSIS — C3490 Malignant neoplasm of unspecified part of unspecified bronchus or lung: Secondary | ICD-10-CM

## 2014-06-21 DIAGNOSIS — C3491 Malignant neoplasm of unspecified part of right bronchus or lung: Secondary | ICD-10-CM

## 2014-06-21 DIAGNOSIS — Z95828 Presence of other vascular implants and grafts: Secondary | ICD-10-CM

## 2014-06-21 DIAGNOSIS — Z5111 Encounter for antineoplastic chemotherapy: Secondary | ICD-10-CM

## 2014-06-21 DIAGNOSIS — D6481 Anemia due to antineoplastic chemotherapy: Secondary | ICD-10-CM

## 2014-06-21 LAB — COMPREHENSIVE METABOLIC PANEL (CC13)
ALT: <6 U/L (ref 0–55)
ANION GAP: 14 meq/L — AB (ref 3–11)
AST: 10 U/L (ref 5–34)
Albumin: 3 g/dL — ABNORMAL LOW (ref 3.5–5.0)
Alkaline Phosphatase: 130 U/L (ref 40–150)
BUN: 15.8 mg/dL (ref 7.0–26.0)
CHLORIDE: 97 meq/L — AB (ref 98–109)
CO2: 27 mEq/L (ref 22–29)
CREATININE: 0.7 mg/dL (ref 0.6–1.1)
Calcium: 5.6 mg/dL — CL (ref 8.4–10.4)
EGFR: 86 mL/min/{1.73_m2} — ABNORMAL LOW (ref >90)
Glucose: 99 mg/dl (ref 70–140)
POTASSIUM: 4.3 meq/L (ref 3.5–5.1)
Sodium: 138 mEq/L (ref 136–145)
TOTAL PROTEIN: 7 g/dL (ref 6.4–8.3)
Total Bilirubin: 1.52 mg/dL — ABNORMAL HIGH (ref 0.20–1.20)

## 2014-06-21 LAB — CBC WITH DIFFERENTIAL/PLATELET
BASO%: 1.1 % (ref 0.0–2.0)
BASOS ABS: 0.1 10*3/uL (ref 0.0–0.1)
EOS ABS: 0 10*3/uL (ref 0.0–0.5)
EOS%: 0 % (ref 0.0–7.0)
HEMATOCRIT: 30.7 % — AB (ref 34.8–46.6)
HGB: 10.1 g/dL — ABNORMAL LOW (ref 11.6–15.9)
LYMPH%: 8.1 % — ABNORMAL LOW (ref 14.0–49.7)
MCH: 30.4 pg (ref 25.1–34.0)
MCHC: 32.9 g/dL (ref 31.5–36.0)
MCV: 92.4 fL (ref 79.5–101.0)
MONO#: 1.3 10*3/uL — ABNORMAL HIGH (ref 0.1–0.9)
MONO%: 11 % (ref 0.0–14.0)
NEUT#: 9.7 10*3/uL — ABNORMAL HIGH (ref 1.5–6.5)
NEUT%: 79.8 % — AB (ref 38.4–76.8)
Platelets: 251 10*3/uL (ref 145–400)
RBC: 3.32 10*6/uL — AB (ref 3.70–5.45)
RDW: 16.7 % — AB (ref 11.2–14.5)
WBC: 12.1 10*3/uL — ABNORMAL HIGH (ref 3.9–10.3)
lymph#: 1 10*3/uL (ref 0.9–3.3)

## 2014-06-21 MED ORDER — DEXAMETHASONE SODIUM PHOSPHATE 20 MG/5ML IJ SOLN
INTRAMUSCULAR | Status: AC
  Filled 2014-06-21: qty 5

## 2014-06-21 MED ORDER — DEXAMETHASONE SODIUM PHOSPHATE 20 MG/5ML IJ SOLN
20.0000 mg | Freq: Once | INTRAMUSCULAR | Status: AC
  Administered 2014-06-21: 20 mg via INTRAVENOUS

## 2014-06-21 MED ORDER — ONDANSETRON 16 MG/50ML IVPB (CHCC)
INTRAVENOUS | Status: AC
  Filled 2014-06-21: qty 16

## 2014-06-21 MED ORDER — SODIUM CHLORIDE 0.9 % IV SOLN
80.0000 mg/m2 | Freq: Once | INTRAVENOUS | Status: AC
  Administered 2014-06-21: 130 mg via INTRAVENOUS
  Filled 2014-06-21: qty 6.5

## 2014-06-21 MED ORDER — SODIUM CHLORIDE 0.9 % IV SOLN
Freq: Once | INTRAVENOUS | Status: AC
  Administered 2014-06-21: 15:00:00 via INTRAVENOUS

## 2014-06-21 MED ORDER — ONDANSETRON 16 MG/50ML IVPB (CHCC)
16.0000 mg | Freq: Once | INTRAVENOUS | Status: AC
  Administered 2014-06-21: 16 mg via INTRAVENOUS

## 2014-06-21 MED ORDER — SODIUM CHLORIDE 0.9 % IJ SOLN
10.0000 mL | INTRAMUSCULAR | Status: DC | PRN
  Administered 2014-06-21: 10 mL
  Filled 2014-06-21: qty 10

## 2014-06-21 MED ORDER — CARBOPLATIN CHEMO INJECTION 450 MG/45ML
290.8000 mg | Freq: Once | INTRAVENOUS | Status: AC
  Administered 2014-06-21: 290 mg via INTRAVENOUS
  Filled 2014-06-21: qty 29

## 2014-06-21 MED ORDER — HEPARIN SOD (PORK) LOCK FLUSH 100 UNIT/ML IV SOLN
500.0000 [IU] | Freq: Once | INTRAVENOUS | Status: AC | PRN
  Administered 2014-06-21: 500 [IU]
  Filled 2014-06-21: qty 5

## 2014-06-21 MED ORDER — SODIUM CHLORIDE 0.9 % IJ SOLN
10.0000 mL | INTRAMUSCULAR | Status: DC | PRN
  Administered 2014-06-21: 10 mL via INTRAVENOUS
  Filled 2014-06-21: qty 10

## 2014-06-21 NOTE — Telephone Encounter (Signed)
Per Dr Vista Mink, pt needs to increase her Calcium to 600mg  TID.  She was currently taking 600mg  BID.  Delton See is also on hold.  Pharmacy is aware.  Informed infusion RN to ask pt to increase daily calcium supplement.

## 2014-06-21 NOTE — Patient Instructions (Signed)

## 2014-06-21 NOTE — Patient Instructions (Signed)
[name] Discharge Instructions for Patients Receiving Chemotherapy  Today you received the following chemotherapy agents [id number]/[id number]  To help prevent nausea and vomiting after your treatment, we encourage you to take your nausea medication  As directed/prescribed   If you develop nausea and vomiting that is not controlled by your nausea medication, call the clinic.   BELOW ARE SYMPTOMS THAT SHOULD BE REPORTED IMMEDIATELY:  *FEVER GREATER THAN 100.5 F  *CHILLS WITH OR WITHOUT FEVER  NAUSEA AND VOMITING THAT IS NOT CONTROLLED WITH YOUR NAUSEA MEDICATION  *UNUSUAL SHORTNESS OF BREATH  *UNUSUAL BRUISING OR BLEEDING  TENDERNESS IN MOUTH AND THROAT WITH OR WITHOUT PRESENCE OF ULCERS  *URINARY PROBLEMS  *BOWEL PROBLEMS  UNUSUAL RASH Items with * indicate a potential emergency and should be followed up as soon as possible.  Feel free to call the clinic you have any questions or concerns. The clinic phone number is [phone].

## 2014-06-21 NOTE — Progress Notes (Signed)
Per Dr Vista Mink, okay to treat with labs 06/21/14.  Informed Infusion RN

## 2014-06-22 ENCOUNTER — Telehealth: Payer: Self-pay | Admitting: Certified Registered Nurse Anesthetist

## 2014-06-22 ENCOUNTER — Ambulatory Visit (HOSPITAL_BASED_OUTPATIENT_CLINIC_OR_DEPARTMENT_OTHER): Payer: Medicare Other

## 2014-06-22 DIAGNOSIS — Z5111 Encounter for antineoplastic chemotherapy: Secondary | ICD-10-CM

## 2014-06-22 DIAGNOSIS — C7951 Secondary malignant neoplasm of bone: Secondary | ICD-10-CM

## 2014-06-22 DIAGNOSIS — C3491 Malignant neoplasm of unspecified part of right bronchus or lung: Secondary | ICD-10-CM

## 2014-06-22 MED ORDER — SODIUM CHLORIDE 0.9 % IV SOLN
Freq: Once | INTRAVENOUS | Status: AC
  Administered 2014-06-22: 13:00:00 via INTRAVENOUS

## 2014-06-22 MED ORDER — PROCHLORPERAZINE MALEATE 10 MG PO TABS
10.0000 mg | ORAL_TABLET | Freq: Once | ORAL | Status: AC
  Administered 2014-06-22: 10 mg via ORAL

## 2014-06-22 MED ORDER — HEPARIN SOD (PORK) LOCK FLUSH 100 UNIT/ML IV SOLN
500.0000 [IU] | Freq: Once | INTRAVENOUS | Status: AC | PRN
  Administered 2014-06-22: 500 [IU]
  Filled 2014-06-22: qty 5

## 2014-06-22 MED ORDER — PROCHLORPERAZINE MALEATE 10 MG PO TABS
ORAL_TABLET | ORAL | Status: AC
  Filled 2014-06-22: qty 1

## 2014-06-22 MED ORDER — SODIUM CHLORIDE 0.9 % IJ SOLN
10.0000 mL | INTRAMUSCULAR | Status: DC | PRN
  Administered 2014-06-22: 10 mL
  Filled 2014-06-22: qty 10

## 2014-06-22 MED ORDER — SODIUM CHLORIDE 0.9 % IV SOLN
130.0000 mg | Freq: Once | INTRAVENOUS | Status: AC
  Administered 2014-06-22: 130 mg via INTRAVENOUS
  Filled 2014-06-22: qty 6.5

## 2014-06-22 NOTE — Patient Instructions (Signed)
Etoposide, VP-16 injection What is this medicine? ETOPOSIDE, VP-16 (e toe POE side) is a chemotherapy drug. It is used to treat testicular cancer, lung cancer, and other cancers. This medicine may be used for other purposes; ask your health care provider or pharmacist if you have questions. COMMON BRAND NAME(S): Etopophos, Toposar, VePesid What should I tell my health care provider before I take this medicine? They need to know if you have any of these conditions: -infection -kidney disease -low blood counts, like low white cell, platelet, or red cell counts -an unusual or allergic reaction to etoposide, other chemotherapeutic agents, other medicines, foods, dyes, or preservatives -pregnant or trying to get pregnant -breast-feeding How should I use this medicine? This medicine is for infusion into a vein. It is administered in a hospital or clinic by a specially trained health care professional. Talk to your pediatrician regarding the use of this medicine in children. Special care may be needed. Overdosage: If you think you have taken too much of this medicine contact a poison control center or emergency room at once. NOTE: This medicine is only for you. Do not share this medicine with others. What if I miss a dose? It is important not to miss your dose. Call your doctor or health care professional if you are unable to keep an appointment. What may interact with this medicine? -cyclosporine -medicines to increase blood counts like filgrastim, pegfilgrastim, sargramostim -vaccines This list may not describe all possible interactions. Give your health care provider a list of all the medicines, herbs, non-prescription drugs, or dietary supplements you use. Also tell them if you smoke, drink alcohol, or use illegal drugs. Some items may interact with your medicine. What should I watch for while using this medicine? Visit your doctor for checks on your progress. This drug may make you feel  generally unwell. This is not uncommon, as chemotherapy can affect healthy cells as well as cancer cells. Report any side effects. Continue your course of treatment even though you feel ill unless your doctor tells you to stop. In some cases, you may be given additional medicines to help with side effects. Follow all directions for their use. Call your doctor or health care professional for advice if you get a fever, chills or sore throat, or other symptoms of a cold or flu. Do not treat yourself. This drug decreases your body's ability to fight infections. Try to avoid being around people who are sick. This medicine may increase your risk to bruise or bleed. Call your doctor or health care professional if you notice any unusual bleeding. Be careful brushing and flossing your teeth or using a toothpick because you may get an infection or bleed more easily. If you have any dental work done, tell your dentist you are receiving this medicine. Avoid taking products that contain aspirin, acetaminophen, ibuprofen, naproxen, or ketoprofen unless instructed by your doctor. These medicines may hide a fever. Do not become pregnant while taking this medicine. Women should inform their doctor if they wish to become pregnant or think they might be pregnant. There is a potential for serious side effects to an unborn child. Talk to your health care professional or pharmacist for more information. Do not breast-feed an infant while taking this medicine. What side effects may I notice from receiving this medicine? Side effects that you should report to your doctor or health care professional as soon as possible: -allergic reactions like skin rash, itching or hives, swelling of the face, lips, or tongue -  low blood counts - this medicine may decrease the number of white blood cells, red blood cells and platelets. You may be at increased risk for infections and bleeding. -signs of infection - fever or chills, cough, sore  throat, pain or difficulty passing urine -signs of decreased platelets or bleeding - bruising, pinpoint red spots on the skin, black, tarry stools, blood in the urine -signs of decreased red blood cells - unusually weak or tired, fainting spells, lightheadedness -breathing problems -changes in vision -mouth or throat sores or ulcers -pain, redness, swelling or irritation at the injection site -pain, tingling, numbness in the hands or feet -redness, blistering, peeling or loosening of the skin, including inside the mouth -seizures -vomiting Side effects that usually do not require medical attention (report to your doctor or health care professional if they continue or are bothersome): -diarrhea -hair loss -loss of appetite -nausea -stomach pain This list may not describe all possible side effects. Call your doctor for medical advice about side effects. You may report side effects to FDA at 1-800-FDA-1088. Where should I keep my medicine? This drug is given in a hospital or clinic and will not be stored at home. NOTE: This sheet is a summary. It may not cover all possible information. If you have questions about this medicine, talk to your doctor, pharmacist, or health care provider.  2015, [employer] Standard. (2007-09-06 17:24:12)

## 2014-06-22 NOTE — Telephone Encounter (Signed)
-----   Message from Geralynn Ochs, RN sent at 06/21/2014  3:56 PM EST ----- Regarding: chemo follow up call [facility], speaks little english

## 2014-06-22 NOTE — Telephone Encounter (Signed)
Chemo f/u call request entered in error. Pt. Has been on Carbo/VP. Verified with primary RN from 06/21/2014. HL

## 2014-06-23 ENCOUNTER — Ambulatory Visit (HOSPITAL_BASED_OUTPATIENT_CLINIC_OR_DEPARTMENT_OTHER): Payer: Medicare Other

## 2014-06-23 DIAGNOSIS — C7951 Secondary malignant neoplasm of bone: Secondary | ICD-10-CM

## 2014-06-23 DIAGNOSIS — Z5111 Encounter for antineoplastic chemotherapy: Secondary | ICD-10-CM

## 2014-06-23 DIAGNOSIS — C3491 Malignant neoplasm of unspecified part of right bronchus or lung: Secondary | ICD-10-CM

## 2014-06-23 MED ORDER — PROCHLORPERAZINE MALEATE 10 MG PO TABS
ORAL_TABLET | ORAL | Status: AC
  Filled 2014-06-23: qty 1

## 2014-06-23 MED ORDER — SODIUM CHLORIDE 0.9 % IV SOLN
80.0000 mg/m2 | Freq: Once | INTRAVENOUS | Status: AC
  Administered 2014-06-23: 130 mg via INTRAVENOUS
  Filled 2014-06-23: qty 6.5

## 2014-06-23 MED ORDER — SODIUM CHLORIDE 0.9 % IJ SOLN
10.0000 mL | INTRAMUSCULAR | Status: DC | PRN
  Administered 2014-06-23: 10 mL
  Filled 2014-06-23: qty 10

## 2014-06-23 MED ORDER — HEPARIN SOD (PORK) LOCK FLUSH 100 UNIT/ML IV SOLN
500.0000 [IU] | Freq: Once | INTRAVENOUS | Status: AC | PRN
  Administered 2014-06-23: 500 [IU]
  Filled 2014-06-23: qty 5

## 2014-06-23 MED ORDER — SODIUM CHLORIDE 0.9 % IV SOLN: 80.0000 mg/m2 | Freq: Once | INTRAVENOUS | Status: DC

## 2014-06-23 MED ORDER — PROCHLORPERAZINE MALEATE 10 MG PO TABS
10.0000 mg | ORAL_TABLET | Freq: Once | ORAL | Status: AC
  Administered 2014-06-23: 10 mg via ORAL

## 2014-06-23 MED ORDER — SODIUM CHLORIDE 0.9 % IV SOLN
Freq: Once | INTRAVENOUS | Status: AC
  Administered 2014-06-23: 13:00:00 via INTRAVENOUS

## 2014-06-23 NOTE — Patient Instructions (Signed)
Etoposide, VP-16 injection What is this medicine? ETOPOSIDE, VP-16 (e toe POE side) is a chemotherapy drug. It is used to treat testicular cancer, lung cancer, and other cancers. This medicine may be used for other purposes; ask your health care provider or pharmacist if you have questions. COMMON BRAND NAME(S): Etopophos, Toposar, VePesid What should I tell my health care provider before I take this medicine? They need to know if you have any of these conditions: -infection -kidney disease -low blood counts, like low white cell, platelet, or red cell counts -an unusual or allergic reaction to etoposide, other chemotherapeutic agents, other medicines, foods, dyes, or preservatives -pregnant or trying to get pregnant -breast-feeding How should I use this medicine? This medicine is for infusion into a vein. It is administered in a hospital or clinic by a specially trained health care professional. Talk to your pediatrician regarding the use of this medicine in children. Special care may be needed. Overdosage: If you think you have taken too much of this medicine contact a poison control center or emergency room at once. NOTE: This medicine is only for you. Do not share this medicine with others. What if I miss a dose? It is important not to miss your dose. Call your doctor or health care professional if you are unable to keep an appointment. What may interact with this medicine? -cyclosporine -medicines to increase blood counts like filgrastim, pegfilgrastim, sargramostim -vaccines This list may not describe all possible interactions. Give your health care provider a list of all the medicines, herbs, non-prescription drugs, or dietary supplements you use. Also tell them if you smoke, drink alcohol, or use illegal drugs. Some items may interact with your medicine. What should I watch for while using this medicine? Visit your doctor for checks on your progress. This drug may make you feel  generally unwell. This is not uncommon, as chemotherapy can affect healthy cells as well as cancer cells. Report any side effects. Continue your course of treatment even though you feel ill unless your doctor tells you to stop. In some cases, you may be given additional medicines to help with side effects. Follow all directions for their use. Call your doctor or health care professional for advice if you get a fever, chills or sore throat, or other symptoms of a cold or flu. Do not treat yourself. This drug decreases your body's ability to fight infections. Try to avoid being around people who are sick. This medicine may increase your risk to bruise or bleed. Call your doctor or health care professional if you notice any unusual bleeding. Be careful brushing and flossing your teeth or using a toothpick because you may get an infection or bleed more easily. If you have any dental work done, tell your dentist you are receiving this medicine. Avoid taking products that contain aspirin, acetaminophen, ibuprofen, naproxen, or ketoprofen unless instructed by your doctor. These medicines may hide a fever. Do not become pregnant while taking this medicine. Women should inform their doctor if they wish to become pregnant or think they might be pregnant. There is a potential for serious side effects to an unborn child. Talk to your health care professional or pharmacist for more information. Do not breast-feed an infant while taking this medicine. What side effects may I notice from receiving this medicine? Side effects that you should report to your doctor or health care professional as soon as possible: -allergic reactions like skin rash, itching or hives, swelling of the face, lips, or tongue -  low blood counts - this medicine may decrease the number of white blood cells, red blood cells and platelets. You may be at increased risk for infections and bleeding. -signs of infection - fever or chills, cough, sore  throat, pain or difficulty passing urine -signs of decreased platelets or bleeding - bruising, pinpoint red spots on the skin, black, tarry stools, blood in the urine -signs of decreased red blood cells - unusually weak or tired, fainting spells, lightheadedness -breathing problems -changes in vision -mouth or throat sores or ulcers -pain, redness, swelling or irritation at the injection site -pain, tingling, numbness in the hands or feet -redness, blistering, peeling or loosening of the skin, including inside the mouth -seizures -vomiting Side effects that usually do not require medical attention (report to your doctor or health care professional if they continue or are bothersome): -diarrhea -hair loss -loss of appetite -nausea -stomach pain This list may not describe all possible side effects. Call your doctor for medical advice about side effects. You may report side effects to FDA at 1-800-FDA-1088. Where should I keep my medicine? This drug is given in a hospital or clinic and will not be stored at home. NOTE: This sheet is a summary. It may not cover all possible information. If you have questions about this medicine, talk to your doctor, pharmacist, or health care provider.  2015, [employer] Standard. (2007-09-06 17:24:12)

## 2014-06-24 ENCOUNTER — Ambulatory Visit: Payer: Medicare Other

## 2014-06-26 ENCOUNTER — Ambulatory Visit (HOSPITAL_BASED_OUTPATIENT_CLINIC_OR_DEPARTMENT_OTHER): Payer: Medicare Other

## 2014-06-26 DIAGNOSIS — C7951 Secondary malignant neoplasm of bone: Secondary | ICD-10-CM

## 2014-06-26 DIAGNOSIS — Z5189 Encounter for other specified aftercare: Secondary | ICD-10-CM

## 2014-06-26 DIAGNOSIS — C3491 Malignant neoplasm of unspecified part of right bronchus or lung: Secondary | ICD-10-CM

## 2014-06-26 MED ORDER — PEGFILGRASTIM INJECTION 6 MG/0.6ML ~~LOC~~
6.0000 mg | PREFILLED_SYRINGE | Freq: Once | SUBCUTANEOUS | Status: AC
Start: 2014-06-26 — End: 2014-06-26
  Administered 2014-06-26: 6 mg via SUBCUTANEOUS
  Filled 2014-06-26: qty 0.6

## 2014-06-30 ENCOUNTER — Encounter: Payer: Self-pay | Admitting: Gastroenterology

## 2014-07-11 ENCOUNTER — Ambulatory Visit (HOSPITAL_BASED_OUTPATIENT_CLINIC_OR_DEPARTMENT_OTHER): Payer: Medicare Other

## 2014-07-11 ENCOUNTER — Ambulatory Visit (HOSPITAL_BASED_OUTPATIENT_CLINIC_OR_DEPARTMENT_OTHER): Payer: Medicare Other | Admitting: Internal Medicine

## 2014-07-11 ENCOUNTER — Ambulatory Visit: Payer: Medicare Other

## 2014-07-11 ENCOUNTER — Encounter: Payer: Self-pay | Admitting: Internal Medicine

## 2014-07-11 ENCOUNTER — Ambulatory Visit (HOSPITAL_COMMUNITY)
Admission: RE | Admit: 2014-07-11 | Discharge: 2014-07-11 | Disposition: A | Discharge status: Home / Self Care | Payer: Medicare Other | Source: Ambulatory Visit | Attending: Internal Medicine | Admitting: Internal Medicine

## 2014-07-11 ENCOUNTER — Ambulatory Visit: Payer: Medicare Other | Admitting: Nutrition

## 2014-07-11 ENCOUNTER — Other Ambulatory Visit: Payer: Self-pay | Admitting: Medical Oncology

## 2014-07-11 ENCOUNTER — Other Ambulatory Visit (HOSPITAL_BASED_OUTPATIENT_CLINIC_OR_DEPARTMENT_OTHER): Payer: Medicare Other

## 2014-07-11 VITALS — BP 108/64 | HR 120 | Temp 97.2°F | Resp 18 | Ht 65.0 in | Wt 115.4 lb

## 2014-07-11 VITALS — BP 129/57 | HR 97 | Temp 98.1°F | Resp 18

## 2014-07-11 DIAGNOSIS — C3490 Malignant neoplasm of unspecified part of unspecified bronchus or lung: Secondary | ICD-10-CM | POA: Insufficient documentation

## 2014-07-11 DIAGNOSIS — D6481 Anemia due to antineoplastic chemotherapy: Secondary | ICD-10-CM | POA: Diagnosis present

## 2014-07-11 DIAGNOSIS — R110 Nausea: Secondary | ICD-10-CM

## 2014-07-11 DIAGNOSIS — E213 Hyperparathyroidism, unspecified: Secondary | ICD-10-CM | POA: Diagnosis not present

## 2014-07-11 DIAGNOSIS — T451X5A Adverse effect of antineoplastic and immunosuppressive drugs, initial encounter: Secondary | ICD-10-CM | POA: Insufficient documentation

## 2014-07-11 DIAGNOSIS — M810 Age-related osteoporosis without current pathological fracture: Secondary | ICD-10-CM | POA: Diagnosis not present

## 2014-07-11 DIAGNOSIS — I10 Essential (primary) hypertension: Secondary | ICD-10-CM | POA: Diagnosis not present

## 2014-07-11 DIAGNOSIS — Z79899 Other long term (current) drug therapy: Secondary | ICD-10-CM | POA: Diagnosis not present

## 2014-07-11 DIAGNOSIS — E785 Hyperlipidemia, unspecified: Secondary | ICD-10-CM | POA: Insufficient documentation

## 2014-07-11 DIAGNOSIS — E559 Vitamin D deficiency, unspecified: Secondary | ICD-10-CM | POA: Diagnosis not present

## 2014-07-11 DIAGNOSIS — Z5111 Encounter for antineoplastic chemotherapy: Secondary | ICD-10-CM

## 2014-07-11 DIAGNOSIS — K219 Gastro-esophageal reflux disease without esophagitis: Secondary | ICD-10-CM | POA: Diagnosis not present

## 2014-07-11 DIAGNOSIS — C7951 Secondary malignant neoplasm of bone: Secondary | ICD-10-CM

## 2014-07-11 DIAGNOSIS — Z95828 Presence of other vascular implants and grafts: Secondary | ICD-10-CM

## 2014-07-11 DIAGNOSIS — G251 Drug-induced tremor: Secondary | ICD-10-CM

## 2014-07-11 DIAGNOSIS — C3491 Malignant neoplasm of unspecified part of right bronchus or lung: Secondary | ICD-10-CM

## 2014-07-11 DIAGNOSIS — R251 Tremor, unspecified: Secondary | ICD-10-CM | POA: Insufficient documentation

## 2014-07-11 LAB — COMPREHENSIVE METABOLIC PANEL (CC13)
ALT: <6 U/L (ref 0–55)
ANION GAP: 12 meq/L — AB (ref 3–11)
AST: 6 U/L (ref 5–34)
Albumin: 3.6 g/dL (ref 3.5–5.0)
Alkaline Phosphatase: 146 U/L (ref 40–150)
BILIRUBIN TOTAL: 0.9 mg/dL (ref 0.20–1.20)
BUN: 17.7 mg/dL (ref 7.0–26.0)
CALCIUM: 7.7 mg/dL — AB (ref 8.4–10.4)
CO2: 26 mEq/L (ref 22–29)
Chloride: 98 mEq/L (ref 98–109)
Creatinine: 1 mg/dL (ref 0.6–1.1)
EGFR: 57 mL/min/{1.73_m2} — ABNORMAL LOW (ref >90)
GLUCOSE: 115 mg/dL (ref 70–140)
Potassium: 4 mEq/L (ref 3.5–5.1)
Sodium: 136 mEq/L (ref 136–145)
Total Protein: 6.8 g/dL (ref 6.4–8.3)

## 2014-07-11 LAB — CBC WITH DIFFERENTIAL/PLATELET
BASO%: 0.1 % (ref 0.0–2.0)
Basophils Absolute: 0 10*3/uL (ref 0.0–0.1)
EOS%: 0.1 % (ref 0.0–7.0)
Eosinophils Absolute: 0 10*3/uL (ref 0.0–0.5)
HCT: 21.8 % — ABNORMAL LOW (ref 34.8–46.6)
HGB: 7.3 g/dL — ABNORMAL LOW (ref 11.6–15.9)
LYMPH%: 9.9 % — AB (ref 14.0–49.7)
MCH: 29.8 pg (ref 25.1–34.0)
MCHC: 33.5 g/dL (ref 31.5–36.0)
MCV: 89 fL (ref 79.5–101.0)
MONO#: 0.5 10*3/uL (ref 0.1–0.9)
MONO%: 7.1 % (ref 0.0–14.0)
NEUT#: 6.2 10*3/uL (ref 1.5–6.5)
NEUT%: 82.8 % — AB (ref 38.4–76.8)
Platelets: 114 10*3/uL — ABNORMAL LOW (ref 145–400)
RBC: 2.45 10*6/uL — AB (ref 3.70–5.45)
RDW: 14.8 % — ABNORMAL HIGH (ref 11.2–14.5)
WBC: 7.5 10*3/uL (ref 3.9–10.3)
lymph#: 0.7 10*3/uL — ABNORMAL LOW (ref 0.9–3.3)

## 2014-07-11 LAB — PREPARE RBC (CROSSMATCH)

## 2014-07-11 MED ORDER — ONDANSETRON 16 MG/50ML IVPB (CHCC)
16.0000 mg | Freq: Once | INTRAVENOUS | Status: AC
  Administered 2014-07-11: 16 mg via INTRAVENOUS

## 2014-07-11 MED ORDER — DEXAMETHASONE SODIUM PHOSPHATE 20 MG/5ML IJ SOLN
INTRAMUSCULAR | Status: AC
  Filled 2014-07-11: qty 5

## 2014-07-11 MED ORDER — SODIUM CHLORIDE 0.9 % IJ SOLN
10.0000 mL | INTRAMUSCULAR | Status: DC | PRN
  Administered 2014-07-11: 10 mL via INTRAVENOUS
  Filled 2014-07-11: qty 10

## 2014-07-11 MED ORDER — SODIUM CHLORIDE 0.9 % IV SOLN
80.0000 mg/m2 | Freq: Once | INTRAVENOUS | Status: AC
  Administered 2014-07-11: 130 mg via INTRAVENOUS
  Filled 2014-07-11: qty 6.5

## 2014-07-11 MED ORDER — SODIUM CHLORIDE 0.9 % IV SOLN
Freq: Once | INTRAVENOUS | Status: AC
  Administered 2014-07-11: 10:00:00 via INTRAVENOUS

## 2014-07-11 MED ORDER — DIPHENHYDRAMINE HCL 25 MG PO CAPS
25.0000 mg | ORAL_CAPSULE | Freq: Once | ORAL | Status: AC
  Administered 2014-07-11: 25 mg via ORAL

## 2014-07-11 MED ORDER — DIPHENHYDRAMINE HCL 25 MG PO CAPS
ORAL_CAPSULE | ORAL | Status: AC
  Filled 2014-07-11: qty 1

## 2014-07-11 MED ORDER — ONDANSETRON 16 MG/50ML IVPB (CHCC)
INTRAVENOUS | Status: AC
  Filled 2014-07-11: qty 16

## 2014-07-11 MED ORDER — SODIUM CHLORIDE 0.9 % IV SOLN
290.8000 mg | Freq: Once | INTRAVENOUS | Status: AC
  Administered 2014-07-11: 290 mg via INTRAVENOUS
  Filled 2014-07-11: qty 29

## 2014-07-11 MED ORDER — ACETAMINOPHEN 325 MG PO TABS
650.0000 mg | ORAL_TABLET | Freq: Once | ORAL | Status: AC
  Administered 2014-07-11: 650 mg via ORAL

## 2014-07-11 MED ORDER — ACETAMINOPHEN 325 MG PO TABS
ORAL_TABLET | ORAL | Status: AC
  Filled 2014-07-11: qty 2

## 2014-07-11 MED ORDER — SODIUM CHLORIDE 0.9 % IJ SOLN
10.0000 mL | INTRAMUSCULAR | Status: DC | PRN
  Administered 2014-07-11: 10 mL
  Filled 2014-07-11: qty 10

## 2014-07-11 MED ORDER — PROCHLORPERAZINE MALEATE 10 MG PO TABS: 10.0000 mg | ORAL_TABLET | Freq: Four times a day (QID) | ORAL | Status: DC | PRN

## 2014-07-11 MED ORDER — HEPARIN SOD (PORK) LOCK FLUSH 100 UNIT/ML IV SOLN
500.0000 [IU] | Freq: Once | INTRAVENOUS | Status: AC | PRN
  Administered 2014-07-11: 500 [IU]
  Filled 2014-07-11: qty 5

## 2014-07-11 MED ORDER — DEXAMETHASONE SODIUM PHOSPHATE 20 MG/5ML IJ SOLN
20.0000 mg | Freq: Once | INTRAMUSCULAR | Status: AC
  Administered 2014-07-11: 20 mg via INTRAVENOUS

## 2014-07-11 MED ORDER — LORAZEPAM 0.5 MG PO TABS
0.5000 mg | ORAL_TABLET | Freq: Three times a day (TID) | ORAL | Status: DC
Start: 2014-07-11 — End: 2014-12-26

## 2014-07-11 NOTE — Progress Notes (Signed)
Noted HGB to be 7.3. Patient to receive 1 unit Packed RBC's and chemo today per Dr. Julien Nordmann.

## 2014-07-11 NOTE — Patient Instructions (Signed)
Blood Transfusion Information WHAT IS A BLOOD TRANSFUSION? A transfusion is the replacement of blood or some of its parts. Blood is made up of multiple cells which provide different functions. 1. Red blood cells carry oxygen and are used for blood loss replacement. 2. White blood cells fight against infection. 3. Platelets control bleeding. 4. Plasma helps clot blood. 5. Other blood products are available for specialized needs, such as hemophilia or other clotting disorders. BEFORE THE TRANSFUSION  Who gives blood for transfusions?  1. You may be able to donate blood to be used at a later date on yourself (autologous donation). 2. Relatives can be asked to donate blood. This is generally not any safer than if you have received blood from a stranger. The same precautions are taken to ensure safety when a relative's blood is donated. 3. Healthy volunteers who are fully evaluated to make sure their blood is safe. This is blood bank blood. Transfusion therapy is the safest it has ever been in the practice of medicine. Before blood is taken from a donor, a complete history is taken to make sure that person has no history of diseases nor engages in risky social behavior (examples are intravenous drug use or sexual activity with multiple partners). The donor's travel history is screened to minimize risk of transmitting infections, such as malaria. The donated blood is tested for signs of infectious diseases, such as HIV and hepatitis. The blood is then tested to be sure it is compatible with you in order to minimize the chance of a transfusion reaction. If you or a relative donates blood, this is often done in anticipation of surgery and is not appropriate for emergency situations. It takes many days to process the donated blood. RISKS AND COMPLICATIONS Although transfusion therapy is very safe and saves many lives, the main dangers of transfusion include:   Getting an infectious disease.  Developing a  transfusion reaction. This is an allergic reaction to something in the blood you were given. Every precaution is taken to prevent this. The decision to have a blood transfusion has been considered carefully by your caregiver before blood is given. Blood is not given unless the benefits outweigh the risks. AFTER THE TRANSFUSION  Right after receiving a blood transfusion, you will usually feel much better and more energetic. This is especially true if your red blood cells have gotten low (anemic). The transfusion raises the level of the red blood cells which carry oxygen, and this usually causes an energy increase.  The nurse administering the transfusion will monitor you carefully for complications. HOME CARE INSTRUCTIONS  No special instructions are needed after a transfusion. You may find your energy is better. Speak with your caregiver about any limitations on activity for underlying diseases you may have. SEEK MEDICAL CARE IF:   Your condition is not improving after your transfusion.  You develop redness or irritation at the intravenous (IV) site. SEEK IMMEDIATE MEDICAL CARE IF:  Any of the following symptoms occur over the next 12 hours:  Shaking chills.  You have a temperature by mouth above 102 F (38.9 C), not controlled by medicine.  Chest, back, or muscle pain.  People around you feel you are not acting correctly or are confused.  Shortness of breath or difficulty breathing.  Dizziness and fainting.  You get a rash or develop hives.  You have a decrease in urine output.  Your urine turns a dark color or changes to pink, red, or brown. Any of the following  symptoms occur over the next 10 days:  You have a temperature by mouth above 102 F (38.9 C), not controlled by medicine.  Shortness of breath.  Weakness after normal activity.  The white part of the eye turns yellow (jaundice).  You have a decrease in the amount of urine or are urinating less often.  Your  urine turns a dark color or changes to pink, red, or brown. Document Released: 05/02/2000 Document Revised: 07/28/2011 Document Reviewed: 12/20/2007 [facility] Patient Information 2015 [place], [place]. This information is not intended to replace advice given to you by your health care provider. Make sure you discuss any questions you have with your health care provider.  [facility] Discharge Instructions for Patients Receiving Chemotherapy  Today you received the following chemotherapy agents: Carboplatin, Etoposide  To help prevent nausea and vomiting after your treatment, we encourage you to take your nausea medication: compazine 10 mg every 6 hours as needed.   If you develop nausea and vomiting that is not controlled by your nausea medication, call the clinic.   BELOW ARE SYMPTOMS THAT SHOULD BE REPORTED IMMEDIATELY:  *FEVER GREATER THAN 100.5 F  *CHILLS WITH OR WITHOUT FEVER  NAUSEA AND VOMITING THAT IS NOT CONTROLLED WITH YOUR NAUSEA MEDICATION  *UNUSUAL SHORTNESS OF BREATH  *UNUSUAL BRUISING OR BLEEDING  TENDERNESS IN MOUTH AND THROAT WITH OR WITHOUT PRESENCE OF ULCERS  *URINARY PROBLEMS  *BOWEL PROBLEMS  UNUSUAL RASH Items with * indicate a potential emergency and should be followed up as soon as possible.  Feel free to call the clinic you have any questions or concerns. The clinic phone number is [phone].

## 2014-07-11 NOTE — Progress Notes (Signed)
[facility] [phone]   [phone]  OFFICE PROGRESS NOTE  [name], MD [address] [place] [address]  DIAGNOSIS: Extensive stage, stage IV (T3, N2, M1b) small cell lung cancer diagnosed in October 2015.  PRIOR THERAPY: None  CURRENT THERAPY: Systemic chemotherapy with carboplatin for AUC of 5 on day 1 and etoposide 100 MG/M2 on days 1, 2 and 3 with Neulasta support on day 4. Status post 5 cycles. Starting from cycle #5 carboplatin with for AUC of 4 and etoposide 80 MG/M2  INTERVAL HISTORY: [name] 74 y.o. female returns to the clinic today for follow-up visit accompanied by her husband. The patient tolerated the last cycle of her systemic chemotherapy with reduced dose carboplatin and etoposide fairly well except for mild fatigue likely secondary to chemotherapy-induced anemia. She is currently on oxycodone for pain management but the patient does not like to take it at nighttime because of insomnia. She denied having any significant nausea or vomiting, no fever or chills. The patient denied having any significant chest pain but continues to have shortness breath with exertion was no cough or hemoptysis. She is here today to start cycle #6 of her chemotherapy.  MEDICAL HISTORY: Past Medical History  Diagnosis Date  . GERD (gastroesophageal reflux disease)   . IBS (irritable bowel syndrome)   . Adenomatous colon polyp 06/2009  . Hiatal hernia   . Breast cyst   . Diverticulosis   . Hemorrhoids   . Hyperlipidemia   . Hypertension   . Hyperparathyroidism   . Anxiety   . Migraine   . Osteoporosis   . Vitamin D deficiency   . Spinal stenosis   . C. difficile colitis 2006  . Small cell lung carcinoma dx'd 02/2014  . Bone metastases dx'd 02/2014    ALLERGIES:  is allergic to ace inhibitors; cephalexin; etodolac; and gabapentin.  MEDICATIONS:  Current Outpatient Prescriptions  Medication Sig Dispense Refill  . ALPRAZolam  (XANAX) 0.5 MG tablet Take 0.5 mg by mouth at bedtime.      Marland Kitchen atorvastatin (LIPITOR) 80 MG tablet Take 80 mg by mouth every morning.     Marland Kitchen buPROPion (WELLBUTRIN SR) 150 MG 12 hr tablet Take 150 mg by mouth every morning.     Marland Kitchen CALCIUM PO Take 1 tablet by mouth 3 (three) times daily. Starting 06/21/14--taking 600mg  three times daily    . carbidopa-levodopa (SINEMET IR) 25-100 MG per tablet Take 1 tablet by mouth 3 (three) times daily. 90 tablet 3  . dexlansoprazole (DEXILANT) 60 MG capsule TAKE 1 CAPSULE (60 MG TOTAL) BY MOUTH DAILY. (Patient taking differently: Take 60 mg by mouth every morning. ) 30 capsule 11  . Diclofenac (ZORVOLEX) 35 MG CAPS Take 35 mg by mouth every morning.     . feeding supplement, ENSURE COMPLETE, (ENSURE COMPLETE) LIQD Take 237 mLs by mouth 2 (two) times daily between meals. 237 mL 0  . glycopyrrolate (ROBINUL) 2 MG tablet TAKE 1 TABLET (2 MG TOTAL) BY MOUTH 2 (TWO) TIMES DAILY. 60 tablet 11  . hydrochlorothiazide (HYDRODIURIL) 12.5 MG tablet Take 1 tablet by mouth every morning.     Marland Kitchen KLOR-CON M20 20 MEQ tablet TAKE 1 TABLET BY MOUTH EVERY DAY 30 tablet 1  . lidocaine-prilocaine (EMLA) cream Apply 1 application topically as needed. Apply to port 1 hr before chemo 30 g 0  . LORazepam (ATIVAN) 0.5 MG tablet Take 1 tablet (0.5 mg total) by mouth every 8 (eight)  hours. 30 tablet 0  . metoprolol (TOPROL-XL) 50 MG 24 hr tablet Take 50 mg by mouth every morning.     Marland Kitchen oxycodone (OXY-IR) 5 MG capsule 1-2 every 4 hours as needed for back pain (Patient taking differently: Take 5-10 mg by mouth every 4 (four) hours as needed for pain. ) 90 capsule 0  . Polyethylene Glycol 3350 (MIRALAX PO) Take 1 scoop by mouth daily as needed (for constipation).     . prochlorperazine (COMPAZINE) 10 MG tablet Take 1 tablet (10 mg total) by mouth every 6 (six) hours as needed for nausea or vomiting. 30 tablet 1   No current facility-administered medications for this visit.    SURGICAL HISTORY:    Past Surgical History  Procedure Laterality Date  . Cholecystectomy    . Vaginal hysterectomy    . Knee arthroscopy      left  . Parathyroidectomy  2009    [facility]  . Lumbar spine surgery  2001  . Breast biopsy      bilateral  . Total knee arthroplasty      right  . Bladder repair    . Video bronchoscopy Bilateral 03/14/2014    Procedure: VIDEO BRONCHOSCOPY WITHOUT FLUORO;  Surgeon: Tanda Rockers, MD;  Location: [facility];  Service: Cardiopulmonary;  Laterality: Bilateral;    REVIEW OF SYSTEMS:  Constitutional: positive for anorexia, fatigue and weight loss Eyes: negative Ears, nose, mouth, throat, and face: negative Respiratory: positive for dyspnea on exertion Cardiovascular: negative Gastrointestinal: negative Genitourinary:negative Integument/breast: negative Hematologic/lymphatic: negative Musculoskeletal:negative Neurological: positive for tremors Behavioral/Psych: negative Endocrine: negative Allergic/Immunologic: negative   PHYSICAL EXAMINATION: General appearance: alert, cooperative, fatigued and no distress Head: Normocephalic, without obvious abnormality, atraumatic Neck: no adenopathy, no JVD, supple, symmetrical, trachea midline and thyroid not enlarged, symmetric, no tenderness/mass/nodules Lymph nodes: Cervical, supraclavicular, and axillary nodes normal. Resp: clear to auscultation bilaterally Back: symmetric, no curvature. ROM normal. No CVA tenderness. Cardio: regular rate and rhythm, S1, S2 normal, no murmur, click, rub or gallop GI: soft, non-tender; bowel sounds normal; no masses,  no organomegaly Extremities: extremities normal, atraumatic, no cyanosis or edema Neurologic: Alert and oriented X 3, normal strength and tone. Normal symmetric reflexes. Normal coordination and gait  ECOG PERFORMANCE STATUS: 1 - Symptomatic but completely ambulatory  Blood pressure 108/64, pulse 120, temperature 97.2 F (36.2 C), temperature source Oral,  resp. rate 18, height 5\' 5"  (1.651 m), weight 115 lb 6.4 oz (52.345 kg), SpO2 100 %.  LABORATORY DATA: Lab Results  Component Value Date   WBC 7.5 07/11/2014   HGB 7.3* 07/11/2014   HCT 21.8* 07/11/2014   MCV 89.0 07/11/2014   PLT 114* 07/11/2014      Chemistry      Component Value Date/Time   NA 136 07/11/2014 0843   NA 136 05/17/2014 0455   K 4.0 07/11/2014 0843   K 3.4* 05/17/2014 0455   CL 95* 05/17/2014 0455   CO2 26 07/11/2014 0843   CO2 32 05/17/2014 0455   BUN 17.7 07/11/2014 0843   BUN 6 05/17/2014 0455   CREATININE 1.0 07/11/2014 0843   CREATININE 0.78 05/17/2014 0455      Component Value Date/Time   CALCIUM 5.6* 06/21/2014 1341   CALCIUM 7.7* 05/17/2014 0455   ALKPHOS 130 06/21/2014 1341   ALKPHOS 232* 05/13/2014 1605   AST 10 06/21/2014 1341   AST 15 05/13/2014 1605   ALT <6 06/21/2014 1341   ALT 10 05/13/2014 1605   BILITOT 1.52* 06/21/2014  1341   BILITOT 1.1 05/13/2014 1605       RADIOGRAPHIC STUDIES: No results found.  ASSESSMENT AND PLAN: This is a very pleasant 75 years old white female with:  1) Extensive stage small cell lung cancer currently undergoing systemic chemotherapy with carboplatin and etoposide status post 5 cycles. The patient is tolerating her treatment fairly well except for mild fatigue, alopecia and new tremors.  We will proceed with cycle #6 of her chemotherapy today with reduced dose carboplatin and etoposide. The patient would come back for follow-up visit in one month's for reevaluation after repeating CT scan of the chest, abdomen and pelvis for restaging of her disease. 2) metastatic bone disease: The patient will continue on Xgeva 120 mcg subcutaneously every 4 weeks. The patient has no dental issues and currently has dentures. She was also advised to take calcium and vitamin D supplements on daily basis.  3) chemotherapy-induced anemia, I will arrange for the patient to receive 2 units of PRBCs transfusion this week.  4)  For the tremors, she will continue on Ativan 0.5 mg by mouth every 8 hours as needed. She is also followed by neurology.  The patient was advised to call immediately if she has any concerning symptoms in the interval. The patient voices understanding of current disease status and treatment options and is in agreement with the current care plan.  All questions were answered. The patient knows to call the clinic with any problems, questions or concerns. We can certainly see the patient much sooner if necessary.   Disclaimer: This note was dictated with voice recognition software. Similar sounding words can inadvertently be transcribed and may not be corrected upon review.

## 2014-07-11 NOTE — Patient Instructions (Signed)

## 2014-07-11 NOTE — Progress Notes (Signed)
Nutrition follow-up completed with patient during chemotherapy. Patient reports she has had continued inadequate oral intake and has lost 5 pounds. Weight documented as 115.4 pounds February 23, down from 120 pounds January 26. Patient states she has ongoing nausea.   She has nausea medication and states it does help reduce nausea.  Nutrition diagnosis: Inadequate oral intake continues.  Intervention: Educated patient on strategies for eating with nausea. Recommended small frequent meals and snacks. Recommended patient increase Ensure Plus to provide additional calories and protein. Fact sheet was provided.  Teach back method used.  Monitoring, evaluation, goals: Patient will work to increase calories and protein to minimize weight loss.  Next visit: I will continue to work with patient as needed.  **Disclaimer: This note was dictated with voice recognition software. Similar sounding words can inadvertently be transcribed and this note may contain transcription errors which may not have been corrected upon publication of note.**

## 2014-07-12 ENCOUNTER — Other Ambulatory Visit: Payer: Self-pay | Admitting: Medical Oncology

## 2014-07-12 ENCOUNTER — Ambulatory Visit (HOSPITAL_BASED_OUTPATIENT_CLINIC_OR_DEPARTMENT_OTHER): Payer: Medicare Other

## 2014-07-12 VITALS — BP 128/70 | HR 90 | Temp 97.9°F | Resp 20

## 2014-07-12 DIAGNOSIS — D6481 Anemia due to antineoplastic chemotherapy: Secondary | ICD-10-CM

## 2014-07-12 DIAGNOSIS — Z5111 Encounter for antineoplastic chemotherapy: Secondary | ICD-10-CM

## 2014-07-12 DIAGNOSIS — C7951 Secondary malignant neoplasm of bone: Secondary | ICD-10-CM

## 2014-07-12 DIAGNOSIS — T451X5A Adverse effect of antineoplastic and immunosuppressive drugs, initial encounter: Principal | ICD-10-CM

## 2014-07-12 DIAGNOSIS — C3491 Malignant neoplasm of unspecified part of right bronchus or lung: Secondary | ICD-10-CM

## 2014-07-12 MED ORDER — ACETAMINOPHEN 325 MG PO TABS
ORAL_TABLET | ORAL | Status: AC
Start: 2014-07-12 — End: 2014-07-12
  Filled 2014-07-12: qty 2

## 2014-07-12 MED ORDER — SODIUM CHLORIDE 0.9 % IV SOLN
250.0000 mL | Freq: Once | INTRAVENOUS | Status: AC
  Administered 2014-07-12: 250 mL via INTRAVENOUS

## 2014-07-12 MED ORDER — SODIUM CHLORIDE 0.9 % IV SOLN
80.0000 mg/m2 | Freq: Once | INTRAVENOUS | Status: AC
  Administered 2014-07-12: 130 mg via INTRAVENOUS
  Filled 2014-07-12: qty 6.5

## 2014-07-12 MED ORDER — PROCHLORPERAZINE MALEATE 10 MG PO TABS
10.0000 mg | ORAL_TABLET | Freq: Once | ORAL | Status: AC
  Administered 2014-07-12: 10 mg via ORAL

## 2014-07-12 MED ORDER — DIPHENHYDRAMINE HCL 25 MG PO CAPS
25.0000 mg | ORAL_CAPSULE | Freq: Once | ORAL | Status: AC
  Administered 2014-07-12: 25 mg via ORAL

## 2014-07-12 MED ORDER — HEPARIN SOD (PORK) LOCK FLUSH 100 UNIT/ML IV SOLN
500.0000 [IU] | Freq: Once | INTRAVENOUS | Status: AC | PRN
  Administered 2014-07-12: 500 [IU]
  Filled 2014-07-12: qty 5

## 2014-07-12 MED ORDER — PROCHLORPERAZINE MALEATE 10 MG PO TABS
ORAL_TABLET | ORAL | Status: AC
  Filled 2014-07-12: qty 1

## 2014-07-12 MED ORDER — SODIUM CHLORIDE 0.9 % IJ SOLN
10.0000 mL | INTRAMUSCULAR | Status: DC | PRN
  Administered 2014-07-12: 10 mL
  Filled 2014-07-12: qty 10

## 2014-07-12 MED ORDER — DIPHENHYDRAMINE HCL 25 MG PO CAPS
ORAL_CAPSULE | ORAL | Status: AC
  Filled 2014-07-12: qty 1

## 2014-07-12 MED ORDER — SODIUM CHLORIDE 0.9 % IV SOLN
Freq: Once | INTRAVENOUS | Status: AC
  Administered 2014-07-12: 12:00:00 via INTRAVENOUS

## 2014-07-12 MED ORDER — ACETAMINOPHEN 325 MG PO TABS
650.0000 mg | ORAL_TABLET | Freq: Once | ORAL | Status: AC
  Administered 2014-07-12: 650 mg via ORAL

## 2014-07-12 NOTE — Patient Instructions (Signed)
[facility] Discharge Instructions for Patients Receiving Chemotherapy  Today you received the following chemotherapy agents Etoposide  To help prevent nausea and vomiting after your treatment, we encourage you to take your nausea medication as prescribed.   If you develop nausea and vomiting that is not controlled by your nausea medication, call the clinic.   BELOW ARE SYMPTOMS THAT SHOULD BE REPORTED IMMEDIATELY:  *FEVER GREATER THAN 100.5 F  *CHILLS WITH OR WITHOUT FEVER  NAUSEA AND VOMITING THAT IS NOT CONTROLLED WITH YOUR NAUSEA MEDICATION  *UNUSUAL SHORTNESS OF BREATH  *UNUSUAL BRUISING OR BLEEDING  TENDERNESS IN MOUTH AND THROAT WITH OR WITHOUT PRESENCE OF ULCERS  *URINARY PROBLEMS  *BOWEL PROBLEMS  UNUSUAL RASH Items with * indicate a potential emergency and should be followed up as soon as possible.  Feel free to call the clinic you have any questions or concerns. The clinic phone number is [phone].   Blood Transfusion Information WHAT IS A BLOOD TRANSFUSION? A transfusion is the replacement of blood or some of its parts. Blood is made up of multiple cells which provide different functions. 9. Red blood cells carry oxygen and are used for blood loss replacement. 10. White blood cells fight against infection. 11. Platelets control bleeding. 12. Plasma helps clot blood. 13. Other blood products are available for specialized needs, such as hemophilia or other clotting disorders. BEFORE THE TRANSFUSION  Who gives blood for transfusions?  2. You may be able to donate blood to be used at a later date on yourself (autologous donation). 3. Relatives can be asked to donate blood. This is generally not any safer than if you have received blood from a stranger. The same precautions are taken to ensure safety when a relative's blood is donated. 4. Healthy volunteers who are fully evaluated to make sure their blood is safe. This is blood bank  blood. Transfusion therapy is the safest it has ever been in the practice of medicine. Before blood is taken from a donor, a complete history is taken to make sure that person has no history of diseases nor engages in risky social behavior (examples are intravenous drug use or sexual activity with multiple partners). The donor's travel history is screened to minimize risk of transmitting infections, such as malaria. The donated blood is tested for signs of infectious diseases, such as HIV and hepatitis. The blood is then tested to be sure it is compatible with you in order to minimize the chance of a transfusion reaction. If you or a relative donates blood, this is often done in anticipation of surgery and is not appropriate for emergency situations. It takes many days to process the donated blood. RISKS AND COMPLICATIONS Although transfusion therapy is very safe and saves many lives, the main dangers of transfusion include:   Getting an infectious disease.  Developing a transfusion reaction. This is an allergic reaction to something in the blood you were given. Every precaution is taken to prevent this. The decision to have a blood transfusion has been considered carefully by your caregiver before blood is given. Blood is not given unless the benefits outweigh the risks. AFTER THE TRANSFUSION  Right after receiving a blood transfusion, you will usually feel much better and more energetic. This is especially true if your red blood cells have gotten low (anemic). The transfusion raises the level of the red blood cells which carry oxygen, and this usually causes an energy increase.  The nurse administering the transfusion will monitor you carefully  for complications. HOME CARE INSTRUCTIONS  No special instructions are needed after a transfusion. You may find your energy is better. Speak with your caregiver about any limitations on activity for underlying diseases you may have. SEEK MEDICAL CARE IF:    Your condition is not improving after your transfusion.  You develop redness or irritation at the intravenous (IV) site. SEEK IMMEDIATE MEDICAL CARE IF:  Any of the following symptoms occur over the next 12 hours:  Shaking chills.  You have a temperature by mouth above 102 F (38.9 C), not controlled by medicine.  Chest, back, or muscle pain.  People around you feel you are not acting correctly or are confused.  Shortness of breath or difficulty breathing.  Dizziness and fainting.  You get a rash or develop hives.  You have a decrease in urine output.  Your urine turns a dark color or changes to pink, red, or brown. Any of the following symptoms occur over the next 10 days:  You have a temperature by mouth above 102 F (38.9 C), not controlled by medicine.  Shortness of breath.  Weakness after normal activity.  The white part of the eye turns yellow (jaundice).  You have a decrease in the amount of urine or are urinating less often.  Your urine turns a dark color or changes to pink, red, or brown. Document Released: 05/02/2000 Document Revised: 07/28/2011 Document Reviewed: 12/20/2007 [facility] Patient Information 2015 [place], [place]. This information is not intended to replace advice given to you by your health care provider. Make sure you discuss any questions you have with your health care provider.

## 2014-07-13 ENCOUNTER — Ambulatory Visit (HOSPITAL_BASED_OUTPATIENT_CLINIC_OR_DEPARTMENT_OTHER): Payer: Medicare Other

## 2014-07-13 ENCOUNTER — Other Ambulatory Visit: Payer: Self-pay | Admitting: Medical Oncology

## 2014-07-13 ENCOUNTER — Telehealth: Payer: Self-pay | Admitting: Internal Medicine

## 2014-07-13 DIAGNOSIS — C3491 Malignant neoplasm of unspecified part of right bronchus or lung: Secondary | ICD-10-CM

## 2014-07-13 DIAGNOSIS — C7951 Secondary malignant neoplasm of bone: Secondary | ICD-10-CM

## 2014-07-13 DIAGNOSIS — Z5111 Encounter for antineoplastic chemotherapy: Secondary | ICD-10-CM

## 2014-07-13 LAB — TYPE AND SCREEN
ABO/RH(D): O POS
Antibody Screen: NEGATIVE
UNIT DIVISION: 0
Unit division: 0

## 2014-07-13 MED ORDER — HEPARIN SOD (PORK) LOCK FLUSH 100 UNIT/ML IV SOLN
500.0000 [IU] | Freq: Once | INTRAVENOUS | Status: AC | PRN
  Administered 2014-07-13: 500 [IU]
  Filled 2014-07-13: qty 5

## 2014-07-13 MED ORDER — SODIUM CHLORIDE 0.9 % IV SOLN
Freq: Once | INTRAVENOUS | Status: AC
  Administered 2014-07-13: 13:00:00 via INTRAVENOUS

## 2014-07-13 MED ORDER — SODIUM CHLORIDE 0.9 % IJ SOLN
10.0000 mL | INTRAMUSCULAR | Status: DC | PRN
  Administered 2014-07-13: 10 mL
  Filled 2014-07-13: qty 10

## 2014-07-13 MED ORDER — PROCHLORPERAZINE MALEATE 10 MG PO TABS
ORAL_TABLET | ORAL | Status: AC
  Filled 2014-07-13: qty 1

## 2014-07-13 MED ORDER — PROCHLORPERAZINE MALEATE 10 MG PO TABS
10.0000 mg | ORAL_TABLET | Freq: Once | ORAL | Status: AC
  Administered 2014-07-13: 10 mg via ORAL

## 2014-07-13 MED ORDER — ETOPOSIDE CHEMO INJECTION 1 GM/50ML
74.0000 mg/m2 | Freq: Once | INTRAVENOUS | Status: AC
  Administered 2014-07-13: 130 mg via INTRAVENOUS
  Filled 2014-07-13: qty 6.5

## 2014-07-13 NOTE — Patient Instructions (Signed)
[name] Discharge Instructions for Patients Receiving Chemotherapy  Today you received the following chemotherapy agents:  Etoposide  To help prevent nausea and vomiting after your treatment, we encourage you to take your nausea medication as ordered per MD.   If you develop nausea and vomiting that is not controlled by your nausea medication, call the clinic.   BELOW ARE SYMPTOMS THAT SHOULD BE REPORTED IMMEDIATELY:  *FEVER GREATER THAN 100.5 F  *CHILLS WITH OR WITHOUT FEVER  NAUSEA AND VOMITING THAT IS NOT CONTROLLED WITH YOUR NAUSEA MEDICATION  *UNUSUAL SHORTNESS OF BREATH  *UNUSUAL BRUISING OR BLEEDING  TENDERNESS IN MOUTH AND THROAT WITH OR WITHOUT PRESENCE OF ULCERS  *URINARY PROBLEMS  *BOWEL PROBLEMS  UNUSUAL RASH Items with * indicate a potential emergency and should be followed up as soon as possible.  Feel free to call the clinic you have any questions or concerns. The clinic phone number is [phone].

## 2014-07-13 NOTE — Telephone Encounter (Signed)
s.w. pt husband and advised to let pt know to pick up new sched at [place] visit

## 2014-07-14 ENCOUNTER — Ambulatory Visit (HOSPITAL_BASED_OUTPATIENT_CLINIC_OR_DEPARTMENT_OTHER): Payer: Medicare Other

## 2014-07-14 DIAGNOSIS — Z5189 Encounter for other specified aftercare: Secondary | ICD-10-CM

## 2014-07-14 DIAGNOSIS — C3491 Malignant neoplasm of unspecified part of right bronchus or lung: Secondary | ICD-10-CM

## 2014-07-14 MED ORDER — PEGFILGRASTIM INJECTION 6 MG/0.6ML ~~LOC~~
6.0000 mg | PREFILLED_SYRINGE | Freq: Once | SUBCUTANEOUS | Status: AC
  Administered 2014-07-14: 6 mg via SUBCUTANEOUS
  Filled 2014-07-14: qty 0.6

## 2014-07-17 ENCOUNTER — Telehealth: Payer: Self-pay | Admitting: Medical Oncology

## 2014-07-17 NOTE — Telephone Encounter (Signed)
Per Dr Julien Nordmann I left a message on pts home phone that Dr Julien Nordmann is not sure she needs colonoscopy. I instructed pt to contact Dr stark taht shseis getting chemo adn if he wants her to have procedure to contact [facility].

## 2014-07-18 ENCOUNTER — Telehealth: Payer: Self-pay | Admitting: Medical Oncology

## 2014-07-18 ENCOUNTER — Ambulatory Visit (HOSPITAL_BASED_OUTPATIENT_CLINIC_OR_DEPARTMENT_OTHER): Payer: Medicare Other

## 2014-07-18 ENCOUNTER — Other Ambulatory Visit (HOSPITAL_BASED_OUTPATIENT_CLINIC_OR_DEPARTMENT_OTHER): Payer: Medicare Other

## 2014-07-18 ENCOUNTER — Other Ambulatory Visit: Payer: Medicare Other

## 2014-07-18 VITALS — BP 119/65 | HR 104 | Temp 97.8°F | Resp 18

## 2014-07-18 DIAGNOSIS — C3491 Malignant neoplasm of unspecified part of right bronchus or lung: Secondary | ICD-10-CM

## 2014-07-18 DIAGNOSIS — R110 Nausea: Secondary | ICD-10-CM

## 2014-07-18 DIAGNOSIS — D6481 Anemia due to antineoplastic chemotherapy: Secondary | ICD-10-CM

## 2014-07-18 DIAGNOSIS — Z452 Encounter for adjustment and management of vascular access device: Secondary | ICD-10-CM

## 2014-07-18 DIAGNOSIS — Z95828 Presence of other vascular implants and grafts: Secondary | ICD-10-CM

## 2014-07-18 LAB — COMPREHENSIVE METABOLIC PANEL (CC13)
ALK PHOS: 102 U/L (ref 40–150)
ALT: <6 U/L (ref 0–55)
AST: 9 U/L (ref 5–34)
Albumin: 3.6 g/dL (ref 3.5–5.0)
Anion Gap: 13 mEq/L — ABNORMAL HIGH (ref 3–11)
BUN: 26.2 mg/dL — AB (ref 7.0–26.0)
CALCIUM: 6.4 mg/dL — AB (ref 8.4–10.4)
CO2: 28 mEq/L (ref 22–29)
Chloride: 97 mEq/L — ABNORMAL LOW (ref 98–109)
Creatinine: 0.8 mg/dL (ref 0.6–1.1)
EGFR: 69 mL/min/{1.73_m2} — AB (ref >90)
GLUCOSE: 100 mg/dL (ref 70–140)
POTASSIUM: 4.6 meq/L (ref 3.5–5.1)
SODIUM: 138 meq/L (ref 136–145)
Total Bilirubin: 1.64 mg/dL — ABNORMAL HIGH (ref 0.20–1.20)
Total Protein: 6.7 g/dL (ref 6.4–8.3)

## 2014-07-18 LAB — CBC WITH DIFFERENTIAL/PLATELET
BASO%: 0 % (ref 0.0–2.0)
Basophils Absolute: 0 10*3/uL (ref 0.0–0.1)
EOS%: 1.6 % (ref 0.0–7.0)
Eosinophils Absolute: 0 10*3/uL (ref 0.0–0.5)
HCT: 23.7 % — ABNORMAL LOW (ref 34.8–46.6)
HGB: 7.9 g/dL — ABNORMAL LOW (ref 11.6–15.9)
LYMPH%: 58.1 % — AB (ref 14.0–49.7)
MCH: 28.8 pg (ref 25.1–34.0)
MCHC: 33.3 g/dL (ref 31.5–36.0)
MCV: 86.5 fL (ref 79.5–101.0)
MONO#: 0 10*3/uL — ABNORMAL LOW (ref 0.1–0.9)
MONO%: 3.2 % (ref 0.0–14.0)
NEUT#: 0.2 10*3/uL — CL (ref 1.5–6.5)
NEUT%: 37.1 % — ABNORMAL LOW (ref 38.4–76.8)
NRBC: 0 % (ref 0–0)
Platelets: 52 10*3/uL — ABNORMAL LOW (ref 145–400)
RBC: 2.74 10*6/uL — AB (ref 3.70–5.45)
RDW: 15.2 % — ABNORMAL HIGH (ref 11.2–14.5)
WBC: 0.6 10*3/uL — CL (ref 3.9–10.3)
lymph#: 0.4 10*3/uL — ABNORMAL LOW (ref 0.9–3.3)

## 2014-07-18 MED ORDER — HEPARIN SOD (PORK) LOCK FLUSH 100 UNIT/ML IV SOLN
500.0000 [IU] | Freq: Once | INTRAVENOUS | Status: AC
  Administered 2014-07-18: 500 [IU] via INTRAVENOUS
  Filled 2014-07-18: qty 5

## 2014-07-18 MED ORDER — SODIUM CHLORIDE 0.9 % IJ SOLN
10.0000 mL | INTRAMUSCULAR | Status: DC | PRN
  Administered 2014-07-18: 10 mL via INTRAVENOUS
  Filled 2014-07-18: qty 10

## 2014-07-18 NOTE — Telephone Encounter (Addendum)
I left message for pt to call regarding abnormal labs. I called back and left another message for pt to start calcium 500-600 mg tid, can be in form of tums

## 2014-07-19 NOTE — Telephone Encounter (Addendum)
i spoke to pt about calcium . She is taking OTC  Calcium carbonate 600 mg TID  . I instructed her to increase it to QID. I reviewed neutropenic precautions and when to call.

## 2014-07-25 ENCOUNTER — Telehealth: Payer: Self-pay | Admitting: Medical Oncology

## 2014-07-25 ENCOUNTER — Other Ambulatory Visit: Payer: Medicare Other

## 2014-07-25 ENCOUNTER — Telehealth: Payer: Self-pay | Admitting: Internal Medicine

## 2014-07-25 ENCOUNTER — Ambulatory Visit (HOSPITAL_BASED_OUTPATIENT_CLINIC_OR_DEPARTMENT_OTHER): Payer: Medicare Other

## 2014-07-25 ENCOUNTER — Ambulatory Visit: Payer: Medicare Other

## 2014-07-25 ENCOUNTER — Other Ambulatory Visit: Payer: Self-pay | Admitting: Medical Oncology

## 2014-07-25 ENCOUNTER — Other Ambulatory Visit (HOSPITAL_BASED_OUTPATIENT_CLINIC_OR_DEPARTMENT_OTHER): Payer: Medicare Other

## 2014-07-25 ENCOUNTER — Ambulatory Visit (HOSPITAL_COMMUNITY)
Admission: RE | Admit: 2014-07-25 | Discharge: 2014-07-25 | Disposition: A | Discharge status: Home / Self Care | Payer: Medicare Other | Source: Ambulatory Visit | Attending: Internal Medicine | Admitting: Internal Medicine

## 2014-07-25 VITALS — BP 138/69 | HR 119 | Temp 97.6°F

## 2014-07-25 VITALS — BP 124/80 | HR 60 | Temp 98.4°F | Resp 18

## 2014-07-25 DIAGNOSIS — Z95828 Presence of other vascular implants and grafts: Secondary | ICD-10-CM

## 2014-07-25 DIAGNOSIS — D6481 Anemia due to antineoplastic chemotherapy: Secondary | ICD-10-CM

## 2014-07-25 DIAGNOSIS — D696 Thrombocytopenia, unspecified: Secondary | ICD-10-CM

## 2014-07-25 DIAGNOSIS — C3491 Malignant neoplasm of unspecified part of right bronchus or lung: Secondary | ICD-10-CM

## 2014-07-25 DIAGNOSIS — T451X5A Adverse effect of antineoplastic and immunosuppressive drugs, initial encounter: Secondary | ICD-10-CM | POA: Insufficient documentation

## 2014-07-25 LAB — PREPARE RBC (CROSSMATCH)

## 2014-07-25 LAB — CBC WITH DIFFERENTIAL/PLATELET
BASO%: 0 % (ref 0.0–2.0)
BASOS ABS: 0 10*3/uL (ref 0.0–0.1)
EOS%: 0.4 % (ref 0.0–7.0)
Eosinophils Absolute: 0 10*3/uL (ref 0.0–0.5)
HEMATOCRIT: 18 % — AB (ref 34.8–46.6)
HEMOGLOBIN: 5.9 g/dL — AB (ref 11.6–15.9)
LYMPH#: 0.8 10*3/uL — AB (ref 0.9–3.3)
LYMPH%: 29.9 % (ref 14.0–49.7)
MCH: 28.1 pg (ref 25.1–34.0)
MCHC: 32.8 g/dL (ref 31.5–36.0)
MCV: 85.7 fL (ref 79.5–101.0)
MONO#: 0.4 10*3/uL (ref 0.1–0.9)
MONO%: 13.6 % (ref 0.0–14.0)
NEUT%: 56.1 % (ref 38.4–76.8)
NEUTROS ABS: 1.5 10*3/uL (ref 1.5–6.5)
PLATELETS: 3 10*3/uL — AB (ref 145–400)
RBC: 2.1 10*6/uL — ABNORMAL LOW (ref 3.70–5.45)
RDW: 14.4 % (ref 11.2–14.5)
WBC: 2.6 10*3/uL — AB (ref 3.9–10.3)
nRBC: 0 % (ref 0–0)

## 2014-07-25 LAB — COMPREHENSIVE METABOLIC PANEL (CC13)
ALT: <6 U/L (ref 0–55)
ANION GAP: 10 meq/L (ref 3–11)
AST: 7 U/L (ref 5–34)
Albumin: 3.4 g/dL — ABNORMAL LOW (ref 3.5–5.0)
Alkaline Phosphatase: 103 U/L (ref 40–150)
BILIRUBIN TOTAL: 0.73 mg/dL (ref 0.20–1.20)
BUN: 20 mg/dL (ref 7.0–26.0)
CALCIUM: 7.4 mg/dL — AB (ref 8.4–10.4)
CHLORIDE: 100 meq/L (ref 98–109)
CO2: 27 mEq/L (ref 22–29)
CREATININE: 0.9 mg/dL (ref 0.6–1.1)
EGFR: 64 mL/min/{1.73_m2} — ABNORMAL LOW (ref >90)
GLUCOSE: 109 mg/dL (ref 70–140)
Potassium: 4.9 mEq/L (ref 3.5–5.1)
Sodium: 138 mEq/L (ref 136–145)
Total Protein: 6.7 g/dL (ref 6.4–8.3)

## 2014-07-25 MED ORDER — SODIUM CHLORIDE 0.9 % IV SOLN
250.0000 mL | Freq: Once | INTRAVENOUS | Status: AC
  Administered 2014-07-25: 250 mL via INTRAVENOUS

## 2014-07-25 MED ORDER — DIPHENHYDRAMINE HCL 25 MG PO CAPS
25.0000 mg | ORAL_CAPSULE | Freq: Once | ORAL | Status: AC
  Administered 2014-07-25: 25 mg via ORAL

## 2014-07-25 MED ORDER — HEPARIN SOD (PORK) LOCK FLUSH 100 UNIT/ML IV SOLN
500.0000 [IU] | Freq: Once | INTRAVENOUS | Status: AC
  Administered 2014-07-25: 500 [IU] via INTRAVENOUS
  Filled 2014-07-25: qty 5

## 2014-07-25 MED ORDER — HEPARIN SOD (PORK) LOCK FLUSH 100 UNIT/ML IV SOLN
500.0000 [IU] | Freq: Every day | INTRAVENOUS | Status: AC | PRN
  Administered 2014-07-25: 500 [IU]
  Filled 2014-07-25: qty 5

## 2014-07-25 MED ORDER — SODIUM CHLORIDE 0.9 % IJ SOLN
10.0000 mL | INTRAMUSCULAR | Status: AC | PRN
  Administered 2014-07-25: 10 mL
  Filled 2014-07-25: qty 10

## 2014-07-25 MED ORDER — SODIUM CHLORIDE 0.9 % IJ SOLN
10.0000 mL | INTRAMUSCULAR | Status: DC | PRN
  Administered 2014-07-25: 10 mL via INTRAVENOUS
  Filled 2014-07-25: qty 10

## 2014-07-25 MED ORDER — ACETAMINOPHEN 325 MG PO TABS
ORAL_TABLET | ORAL | Status: AC
  Filled 2014-07-25: qty 2

## 2014-07-25 MED ORDER — DIPHENHYDRAMINE HCL 25 MG PO CAPS
ORAL_CAPSULE | ORAL | Status: AC
  Filled 2014-07-25: qty 1

## 2014-07-25 MED ORDER — ACETAMINOPHEN 325 MG PO TABS
650.0000 mg | ORAL_TABLET | Freq: Once | ORAL | Status: AC
  Administered 2014-07-25: 650 mg via ORAL

## 2014-07-25 NOTE — Telephone Encounter (Signed)
I spoke to pt and she is coming back for type and cross and platelets

## 2014-07-25 NOTE — Patient Instructions (Signed)

## 2014-07-25 NOTE — Telephone Encounter (Signed)
Left message per pof to have patient come back for Blood.

## 2014-07-25 NOTE — Telephone Encounter (Signed)
Pt needs platelets and blood

## 2014-07-25 NOTE — Patient Instructions (Signed)

## 2014-07-26 ENCOUNTER — Ambulatory Visit (HOSPITAL_BASED_OUTPATIENT_CLINIC_OR_DEPARTMENT_OTHER): Payer: Medicare Other

## 2014-07-26 VITALS — BP 138/69 | HR 92 | Temp 98.3°F | Resp 17

## 2014-07-26 DIAGNOSIS — D6481 Anemia due to antineoplastic chemotherapy: Secondary | ICD-10-CM

## 2014-07-26 DIAGNOSIS — T451X5A Adverse effect of antineoplastic and immunosuppressive drugs, initial encounter: Principal | ICD-10-CM

## 2014-07-26 LAB — PREPARE PLATELET PHERESIS: UNIT DIVISION: 0

## 2014-07-26 MED ORDER — SODIUM CHLORIDE 0.9 % IV SOLN
250.0000 mL | Freq: Once | INTRAVENOUS | Status: AC
  Administered 2014-07-26: 250 mL via INTRAVENOUS

## 2014-07-26 MED ORDER — SODIUM CHLORIDE 0.9 % IJ SOLN
10.0000 mL | INTRAMUSCULAR | Status: AC | PRN
  Administered 2014-07-26: 10 mL
  Filled 2014-07-26: qty 10

## 2014-07-26 MED ORDER — DIPHENHYDRAMINE HCL 25 MG PO CAPS
ORAL_CAPSULE | ORAL | Status: AC
  Filled 2014-07-26: qty 1

## 2014-07-26 MED ORDER — ACETAMINOPHEN 325 MG PO TABS
650.0000 mg | ORAL_TABLET | Freq: Once | ORAL | Status: AC
  Administered 2014-07-26: 650 mg via ORAL

## 2014-07-26 MED ORDER — ACETAMINOPHEN 325 MG PO TABS
ORAL_TABLET | ORAL | Status: AC
  Filled 2014-07-26: qty 2

## 2014-07-26 MED ORDER — HEPARIN SOD (PORK) LOCK FLUSH 100 UNIT/ML IV SOLN
500.0000 [IU] | Freq: Every day | INTRAVENOUS | Status: AC | PRN
  Administered 2014-07-26: 500 [IU]
  Filled 2014-07-26: qty 5

## 2014-07-26 MED ORDER — DIPHENHYDRAMINE HCL 25 MG PO CAPS
25.0000 mg | ORAL_CAPSULE | Freq: Once | ORAL | Status: AC
  Administered 2014-07-26: 25 mg via ORAL

## 2014-07-26 NOTE — Patient Instructions (Signed)

## 2014-07-27 LAB — TYPE AND SCREEN
ABO/RH(D): O POS
Antibody Screen: NEGATIVE
Unit division: 0
Unit division: 0

## 2014-07-29 ENCOUNTER — Other Ambulatory Visit: Payer: Self-pay | Admitting: Gastroenterology

## 2014-07-31 ENCOUNTER — Other Ambulatory Visit: Payer: Self-pay | Admitting: Gastroenterology

## 2014-07-31 ENCOUNTER — Other Ambulatory Visit: Payer: Self-pay | Admitting: Internal Medicine

## 2014-07-31 NOTE — Telephone Encounter (Signed)
NEEDS OFFICE VISIT FOR ANY FURTHER REFILLS! 

## 2014-08-01 ENCOUNTER — Ambulatory Visit (INDEPENDENT_AMBULATORY_CARE_PROVIDER_SITE_OTHER): Payer: Medicare Other | Admitting: Neurology

## 2014-08-01 ENCOUNTER — Encounter: Payer: Self-pay | Admitting: Neurology

## 2014-08-01 ENCOUNTER — Ambulatory Visit (HOSPITAL_BASED_OUTPATIENT_CLINIC_OR_DEPARTMENT_OTHER): Payer: Medicare Other

## 2014-08-01 ENCOUNTER — Other Ambulatory Visit: Payer: Medicare Other

## 2014-08-01 ENCOUNTER — Other Ambulatory Visit (HOSPITAL_BASED_OUTPATIENT_CLINIC_OR_DEPARTMENT_OTHER): Payer: Medicare Other

## 2014-08-01 VITALS — BP 102/60 | HR 105 | Temp 97.9°F

## 2014-08-01 VITALS — BP 130/82 | HR 80

## 2014-08-01 DIAGNOSIS — C3491 Malignant neoplasm of unspecified part of right bronchus or lung: Secondary | ICD-10-CM

## 2014-08-01 DIAGNOSIS — Z95828 Presence of other vascular implants and grafts: Secondary | ICD-10-CM

## 2014-08-01 DIAGNOSIS — Z452 Encounter for adjustment and management of vascular access device: Secondary | ICD-10-CM

## 2014-08-01 DIAGNOSIS — D6481 Anemia due to antineoplastic chemotherapy: Secondary | ICD-10-CM

## 2014-08-01 DIAGNOSIS — G20 Parkinson's disease: Secondary | ICD-10-CM

## 2014-08-01 DIAGNOSIS — C3490 Malignant neoplasm of unspecified part of unspecified bronchus or lung: Secondary | ICD-10-CM

## 2014-08-01 LAB — COMPREHENSIVE METABOLIC PANEL (CC13)
ALBUMIN: 3.4 g/dL — AB (ref 3.5–5.0)
AST: 8 U/L (ref 5–34)
Alkaline Phosphatase: 110 U/L (ref 40–150)
Anion Gap: 11 mEq/L (ref 3–11)
BUN: 15.6 mg/dL (ref 7.0–26.0)
CHLORIDE: 100 meq/L (ref 98–109)
CO2: 26 mEq/L (ref 22–29)
Calcium: 8.2 mg/dL — ABNORMAL LOW (ref 8.4–10.4)
Creatinine: 1 mg/dL (ref 0.6–1.1)
EGFR: 58 mL/min/{1.73_m2} — ABNORMAL LOW (ref >90)
Glucose: 150 mg/dl — ABNORMAL HIGH (ref 70–140)
POTASSIUM: 4.5 meq/L (ref 3.5–5.1)
Sodium: 137 mEq/L (ref 136–145)
TOTAL PROTEIN: 6.6 g/dL (ref 6.4–8.3)
Total Bilirubin: 0.58 mg/dL (ref 0.20–1.20)

## 2014-08-01 LAB — CBC WITH DIFFERENTIAL/PLATELET
BASO%: 0.4 % (ref 0.0–2.0)
BASOS ABS: 0 10*3/uL (ref 0.0–0.1)
EOS%: 0.2 % (ref 0.0–7.0)
Eosinophils Absolute: 0 10*3/uL (ref 0.0–0.5)
HCT: 24.3 % — ABNORMAL LOW (ref 34.8–46.6)
HEMOGLOBIN: 7.9 g/dL — AB (ref 11.6–15.9)
LYMPH#: 1.1 10*3/uL (ref 0.9–3.3)
LYMPH%: 16.2 % (ref 14.0–49.7)
MCH: 28.7 pg (ref 25.1–34.0)
MCHC: 32.6 g/dL (ref 31.5–36.0)
MCV: 88.2 fL (ref 79.5–101.0)
MONO#: 0.7 10*3/uL (ref 0.1–0.9)
MONO%: 10.4 % (ref 0.0–14.0)
NEUT#: 4.8 10*3/uL (ref 1.5–6.5)
NEUT%: 72.8 % (ref 38.4–76.8)
Platelets: 59 10*3/uL — ABNORMAL LOW (ref 145–400)
RBC: 2.75 10*6/uL — AB (ref 3.70–5.45)
RDW: 15 % — AB (ref 11.2–14.5)
WBC: 6.6 10*3/uL (ref 3.9–10.3)

## 2014-08-01 MED ORDER — ROTIGOTINE 4 MG/24HR TD PT24: 1.0000 | MEDICATED_PATCH | Freq: Every day | TRANSDERMAL | Status: DC

## 2014-08-01 MED ORDER — ROTIGOTINE 2 MG/24HR TD PT24: 1.0000 | MEDICATED_PATCH | Freq: Every day | TRANSDERMAL | Status: DC

## 2014-08-01 MED ORDER — SODIUM CHLORIDE 0.9 % IJ SOLN
10.0000 mL | INTRAMUSCULAR | Status: DC | PRN
  Administered 2014-08-01: 10 mL via INTRAVENOUS
  Filled 2014-08-01: qty 10

## 2014-08-01 MED ORDER — HEPARIN SOD (PORK) LOCK FLUSH 100 UNIT/ML IV SOLN
500.0000 [IU] | Freq: Once | INTRAVENOUS | Status: AC
  Administered 2014-08-01: 500 [IU] via INTRAVENOUS
  Filled 2014-08-01: qty 5

## 2014-08-01 NOTE — Patient Instructions (Signed)

## 2014-08-01 NOTE — Telephone Encounter (Signed)
NEEDS OFFICE VISIT FOR ANY FURTHER REFILLS! 

## 2014-08-01 NOTE — Progress Notes (Signed)
Subjective:   [name] was seen in consultation in the movement disorder clinic at the request of Drue Second.  Her PCP is  [name], MD.  The evaluation is for tremor.  The records that were made available to me were reviewed.  Pt is accompanied by husband and daughter who supplement the history.    The patient is a 74 y.o. right handed female with a history of tremor.  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  Tremor was minor 5 years ago in the hands and was mostly in the R hand and R leg.  Tremor 5 years ago was at rest and with use but was fairly minor.  It wasn't until until chemo started that they noted that she had tremor in the L leg.  She doesn't walk well but they attribute that to chemotherapy as well.  She had recent fall.  There have been changes in the strength of the voice but again her husband states that is fairly new.  Her walk is now slow and wobbly.  She needs help to get up and down out of the chair.  She denies any significant stiffness.  She has trouble getting to sleep and staying asleep.  No vivid dreams.  No sleep talking/acting out the dreams.  Tremor goes away only when asleep.  She was admitted on 05/13/14  For severe pancytopenia and neutropenia and required 2 units of PRBCs.  She is on ativan q 8 hours and it isn't helping a lot and is affecting to think some.  Her sister had tremor but it was attributed to a stroke.  She has no trouble swallowing.   She denies trouble with ADL's including buttoning up buttons.    08/01/14 update:  Pt presents for f/u.  Accompanied by daughter and niece who supplement the hx.  She has a new dx of PD and was started on carbidopa/levodopa 25/100 tid last visit.  She doesn't think that it has helped.  Certainly hasn't helped the tremor.  Takes it with the meals, but avoids taking it with ensure.   She continues to have  significant difficulties with pancytopenia from small cell/chemo and has had further blood transfusions since last visit.      Current/Previously tried tremor medications: ativan  Current medications that may exacerbate tremor:  n/a  Pt had MRI of the brain on 03/31/14 with and without gad that I did review.  It demonstrated evidence of skull mets but no brain mets.  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  There is evidence of small vessel disease and cerebral atrophy.    Outside reports reviewed: historical medical records and lab reports.  Allergies  Allergen Reactions  . Ace Inhibitors     REACTION: causing cough  . Cephalexin Rash  . Etodolac Rash  . Gabapentin Rash    Outpatient Encounter Prescriptions as of 08/01/2014  Medication Sig  . ALPRAZolam (XANAX) 0.5 MG tablet Take 0.5 mg by mouth at bedtime.    Marland Kitchen atorvastatin (LIPITOR) 80 MG tablet Take 80 mg by mouth every morning.   Marland Kitchen buPROPion (WELLBUTRIN SR) 150 MG 12 hr tablet Take 150 mg by mouth every morning.   Marland Kitchen CALCIUM PO Take 1 tablet by mouth 3 (three) times daily. Starting 06/21/14--taking 600mg  three times daily  . carbidopa-levodopa (SINEMET IR) 25-100 MG per tablet Take 1 tablet by  mouth 3 (three) times daily.  Marland Kitchen DEXILANT 60 MG capsule TAKE ONE CAPSULE BY MOUTH DAILY  . Diclofenac (ZORVOLEX) 35 MG CAPS Take 35 mg by mouth every morning.   . feeding supplement, ENSURE COMPLETE, (ENSURE COMPLETE) LIQD Take 237 mLs by mouth 2 (two) times daily between meals.  Marland Kitchen glycopyrrolate (ROBINUL) 2 MG tablet TAKE 1 TABLET (2 MG TOTAL) BY MOUTH 2 (TWO) TIMES DAILY.  . hydrochlorothiazide (HYDRODIURIL) 12.5 MG tablet Take 1 tablet by mouth every morning.   Marland Kitchen KLOR-CON M20 20 MEQ tablet TAKE 1 TABLET BY MOUTH EVERY DAY  . lidocaine-prilocaine (EMLA) cream Apply 1 application topically as needed. Apply to port 1 hr before chemo  . LORazepam (ATIVAN) 0.5 MG tablet Take 1 tablet (0.5 mg total) by mouth every 8 (eight) hours.  . metoprolol  (TOPROL-XL) 50 MG 24 hr tablet Take 50 mg by mouth every morning.   . Polyethylene Glycol 3350 (MIRALAX PO) Take 1 scoop by mouth daily as needed (for constipation).   . prochlorperazine (COMPAZINE) 10 MG tablet Take 1 tablet (10 mg total) by mouth every 6 (six) hours as needed for nausea or vomiting.  . [DISCONTINUED] glycopyrrolate (ROBINUL) 2 MG tablet TAKE 1 TABLET (2 MG TOTAL) BY MOUTH 2 (TWO) TIMES DAILY.  . [DISCONTINUED] oxycodone (OXY-IR) 5 MG capsule 1-2 every 4 hours as needed for back pain (Patient not taking: Reported on 07/11/2014)    Past Medical History  Diagnosis Date  . GERD (gastroesophageal reflux disease)   . IBS (irritable bowel syndrome)   . Adenomatous colon polyp 06/2009  . Hiatal hernia   . Breast cyst   . Diverticulosis   . Hemorrhoids   . Hyperlipidemia   . Hypertension   . Hyperparathyroidism   . Anxiety   . Migraine   . Osteoporosis   . Vitamin D deficiency   . Spinal stenosis   . C. difficile colitis 2006  . Small cell lung carcinoma dx'd 02/2014  . Bone metastases dx'd 02/2014    Past Surgical History  Procedure Laterality Date  . Cholecystectomy    . Vaginal hysterectomy    . Knee arthroscopy      left  . Parathyroidectomy  2009    [facility]  . Lumbar spine surgery  2001  . Breast biopsy      bilateral  . Total knee arthroplasty      right  . Bladder repair    . Video bronchoscopy Bilateral 03/14/2014    Procedure: VIDEO BRONCHOSCOPY WITHOUT FLUORO;  Surgeon: Tanda Rockers, MD;  Location: [facility];  Service: Cardiopulmonary;  Laterality: Bilateral;    History   Social History  . Marital Status: Married    Spouse Name: N/A  . Number of Children: N/A  . Years of Education: N/A   Occupational History  . Not on file.   Social History Main Topics  . Smoking status: Former Smoker -- 0.25 packs/day for 25 years    Types: Cigarettes    Quit date: 05/19/2013  . Smokeless tobacco: Never Used  . Alcohol Use: No  . Drug Use:  No  . Sexual Activity: Not on file   Other Topics Concern  . Not on file   Social History Narrative   Lives with husband.   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    Family Status  Relation Status Death Age  . Mother Deceased     murdered  . Father Deceased     murdered  .  Sister Alive     DM, Breast Cancer  . Sister Alive     DM, stroke  . Sister Alive     DM  . Brother Alive     DM  . Brother Deceased     MI, DM  . Brother Deceased     unknown  . Daughter Alive     MS  . Brother Alive     healthy    Review of Systems A complete 10 system ROS was obtained and was negative apart from what is mentioned.   Objective:   VITALS:   Filed Vitals:   08/01/14 1046  BP: 130/82  Pulse: 80   Gen:  Appears stated age and in NAD.  Ill appearing.   HEENT:  Normocephalic, atraumatic. The mucous membranes are moist. The superficial temporal arteries are without ropiness or tenderness. Cardiovascular: Regular rate and rhythm. Lungs: Clear to auscultation bilaterally. Neck: There are no carotid bruits noted bilaterally.  NEUROLOGICAL:  Orientation: The patient is alert and oriented x3.  Cranial nerves: There is good facial symmetry. Pupils are equal round and reactive to light bilaterally. Fundoscopic exam is attempted but the disc margins are not well visualized bilaterally. Extraocular muscles are intact. The visual fields are full to confrontational testing. The speech is fluent and clear. Soft palate rises symmetrically and there is no tongue deviation. Hearing is intact to conversational tone. Sensation: Sensation is intact to light and pinprick throughout (facial, trunk, extremities). Vibration is absent at the bilateral big toe. There is no extinction with double simultaneous stimulation. There is no sensory dermatomal level identified. Motor: Strength is 4/5 in the bilateral upper and lower extremities.   Shoulder shrug is equal and symmetric.  There is  no pronator drift. Deep tendon reflexes: Deep tendon reflexes are 1/4 at the bilateral biceps, triceps, brachioradialis, patella and achilles. Plantar responses are downgoing bilaterally.  Movement examination: Tone: There is mild increased tone in the right upper extremity.  There is normal tone in the LUE.  The tone in the lower extremities is normal Abnormal movements: There is significant resting tremor of the right upper and Left lower extremity.  Coordination:  There is good finger taps and good hand opening and closing. Gait and Station: The patient has very minimal difficulty arising out of the chair today.  We gave her a walker, but she used it very little and mostly picked it up when she walked.  She no longer dragged the left leg.  Her stride length was much improved.      Assessment/Plan:   1.  Parkinsons disease  -sounds like she has had sx's for 5 years (RUE/RLE resting tremor) but that sx's dramatically increased in November when started chemotherapy.  They noted great increase in tremor then, but she does meet clinical criteria for dx of PD.    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  I told her we could increase the levodopa or try to add Neupro cautiously.  I'm not sure she would tolerate any of the other dopamine agonists given that she already has nausea associated with her chemotherapy.  I do think levodopa has helped her walking and some of the bradykinesia.  She would like to try the Neupro.  We talked extensively about risks/benefits/side effects of the dopamine agonists.  I gave her samples of the 2 mg for one week and then increased to 4 mg.  I will call her in 3 weeks and see how she is  doing.  - Avoid protein at same time as levodopa. 2.  Stage 4 small cell  -still undergoing chemo which is obviously contributing to weakness  -had severe neutropenia and pancytopenia.  Feels that did not get stronger with PRBC infusion.   3.  Follow up in 3 months, call if  issues before then.

## 2014-08-08 ENCOUNTER — Other Ambulatory Visit: Payer: Medicare Other

## 2014-08-09 ENCOUNTER — Other Ambulatory Visit (HOSPITAL_BASED_OUTPATIENT_CLINIC_OR_DEPARTMENT_OTHER): Payer: Medicare Other

## 2014-08-09 ENCOUNTER — Encounter (HOSPITAL_COMMUNITY): Payer: Self-pay

## 2014-08-09 ENCOUNTER — Other Ambulatory Visit: Payer: Medicare Other

## 2014-08-09 ENCOUNTER — Ambulatory Visit (HOSPITAL_BASED_OUTPATIENT_CLINIC_OR_DEPARTMENT_OTHER): Payer: Medicare Other

## 2014-08-09 ENCOUNTER — Ambulatory Visit (HOSPITAL_COMMUNITY)
Admission: RE | Admit: 2014-08-09 | Discharge: 2014-08-09 | Disposition: A | Discharge status: Home / Self Care | Payer: Medicare Other | Source: Ambulatory Visit | Attending: Internal Medicine | Admitting: Internal Medicine

## 2014-08-09 DIAGNOSIS — C7951 Secondary malignant neoplasm of bone: Secondary | ICD-10-CM | POA: Diagnosis not present

## 2014-08-09 DIAGNOSIS — R110 Nausea: Secondary | ICD-10-CM

## 2014-08-09 DIAGNOSIS — Z95828 Presence of other vascular implants and grafts: Secondary | ICD-10-CM

## 2014-08-09 DIAGNOSIS — C3491 Malignant neoplasm of unspecified part of right bronchus or lung: Secondary | ICD-10-CM

## 2014-08-09 LAB — COMPREHENSIVE METABOLIC PANEL (CC13)
ALBUMIN: 3.5 g/dL (ref 3.5–5.0)
ALT: 7 U/L (ref 0–55)
AST: 10 U/L (ref 5–34)
Alkaline Phosphatase: 124 U/L (ref 40–150)
Anion Gap: 10 mEq/L (ref 3–11)
BILIRUBIN TOTAL: 0.75 mg/dL (ref 0.20–1.20)
BUN: 16.5 mg/dL (ref 7.0–26.0)
CHLORIDE: 102 meq/L (ref 98–109)
CO2: 24 meq/L (ref 22–29)
Calcium: 7.7 mg/dL — ABNORMAL LOW (ref 8.4–10.4)
Creatinine: 0.8 mg/dL (ref 0.6–1.1)
EGFR: 79 mL/min/{1.73_m2} — ABNORMAL LOW (ref >90)
Glucose: 94 mg/dl (ref 70–140)
POTASSIUM: 5 meq/L (ref 3.5–5.1)
Sodium: 137 mEq/L (ref 136–145)
TOTAL PROTEIN: 7.4 g/dL (ref 6.4–8.3)

## 2014-08-09 LAB — CBC WITH DIFFERENTIAL/PLATELET
BASO%: 0.6 % (ref 0.0–2.0)
BASOS ABS: 0 10*3/uL (ref 0.0–0.1)
EOS ABS: 0 10*3/uL (ref 0.0–0.5)
EOS%: 0.1 % (ref 0.0–7.0)
HEMATOCRIT: 25.9 % — AB (ref 34.8–46.6)
HEMOGLOBIN: 8.7 g/dL — AB (ref 11.6–15.9)
LYMPH%: 13 % — ABNORMAL LOW (ref 14.0–49.7)
MCH: 30 pg (ref 25.1–34.0)
MCHC: 33.5 g/dL (ref 31.5–36.0)
MCV: 89.6 fL (ref 79.5–101.0)
MONO#: 1 10*3/uL — ABNORMAL HIGH (ref 0.1–0.9)
MONO%: 13.5 % (ref 0.0–14.0)
NEUT%: 72.8 % (ref 38.4–76.8)
NEUTROS ABS: 5.2 10*3/uL (ref 1.5–6.5)
Platelets: 212 10*3/uL (ref 145–400)
RBC: 2.89 10*6/uL — ABNORMAL LOW (ref 3.70–5.45)
RDW: 18.3 % — AB (ref 11.2–14.5)
WBC: 7.2 10*3/uL (ref 3.9–10.3)
lymph#: 0.9 10*3/uL (ref 0.9–3.3)

## 2014-08-09 MED ORDER — IOHEXOL 300 MG/ML  SOLN
100.0000 mL | Freq: Once | INTRAMUSCULAR | Status: AC | PRN
  Administered 2014-08-09: 90 mL via INTRAVENOUS

## 2014-08-09 MED ORDER — SODIUM CHLORIDE 0.9 % IJ SOLN
10.0000 mL | INTRAMUSCULAR | Status: DC | PRN
  Administered 2014-08-09: 10 mL via INTRAVENOUS
  Filled 2014-08-09: qty 10

## 2014-08-09 NOTE — Patient Instructions (Signed)

## 2014-08-15 ENCOUNTER — Telehealth: Payer: Self-pay | Admitting: Internal Medicine

## 2014-08-15 ENCOUNTER — Ambulatory Visit (HOSPITAL_BASED_OUTPATIENT_CLINIC_OR_DEPARTMENT_OTHER): Payer: Medicare Other | Admitting: Internal Medicine

## 2014-08-15 ENCOUNTER — Other Ambulatory Visit: Payer: Self-pay | Admitting: Medical Oncology

## 2014-08-15 ENCOUNTER — Ambulatory Visit (HOSPITAL_BASED_OUTPATIENT_CLINIC_OR_DEPARTMENT_OTHER): Payer: Medicare Other

## 2014-08-15 ENCOUNTER — Other Ambulatory Visit (HOSPITAL_BASED_OUTPATIENT_CLINIC_OR_DEPARTMENT_OTHER): Payer: Medicare Other

## 2014-08-15 ENCOUNTER — Encounter: Payer: Self-pay | Admitting: Internal Medicine

## 2014-08-15 VITALS — BP 118/72 | HR 100 | Temp 98.5°F | Resp 18 | Ht 65.0 in | Wt 121.3 lb

## 2014-08-15 DIAGNOSIS — Z95828 Presence of other vascular implants and grafts: Secondary | ICD-10-CM

## 2014-08-15 DIAGNOSIS — C3491 Malignant neoplasm of unspecified part of right bronchus or lung: Secondary | ICD-10-CM

## 2014-08-15 DIAGNOSIS — C7951 Secondary malignant neoplasm of bone: Secondary | ICD-10-CM | POA: Diagnosis not present

## 2014-08-15 DIAGNOSIS — D6481 Anemia due to antineoplastic chemotherapy: Secondary | ICD-10-CM

## 2014-08-15 DIAGNOSIS — C3490 Malignant neoplasm of unspecified part of unspecified bronchus or lung: Secondary | ICD-10-CM

## 2014-08-15 DIAGNOSIS — M542 Cervicalgia: Secondary | ICD-10-CM

## 2014-08-15 DIAGNOSIS — R110 Nausea: Secondary | ICD-10-CM | POA: Diagnosis not present

## 2014-08-15 LAB — CBC WITH DIFFERENTIAL/PLATELET
BASO%: 0.2 % (ref 0.0–2.0)
Basophils Absolute: 0 10*3/uL (ref 0.0–0.1)
EOS ABS: 0 10*3/uL (ref 0.0–0.5)
EOS%: 0.3 % (ref 0.0–7.0)
HCT: 26.2 % — ABNORMAL LOW (ref 34.8–46.6)
HGB: 8.5 g/dL — ABNORMAL LOW (ref 11.6–15.9)
LYMPH#: 1 10*3/uL (ref 0.9–3.3)
LYMPH%: 14.5 % (ref 14.0–49.7)
MCH: 30.5 pg (ref 25.1–34.0)
MCHC: 32.4 g/dL (ref 31.5–36.0)
MCV: 93.9 fL (ref 79.5–101.0)
MONO#: 0.9 10*3/uL (ref 0.1–0.9)
MONO%: 13.6 % (ref 0.0–14.0)
NEUT#: 4.7 10*3/uL (ref 1.5–6.5)
NEUT%: 71.4 % (ref 38.4–76.8)
Platelets: 159 10*3/uL (ref 145–400)
RBC: 2.79 10*6/uL — AB (ref 3.70–5.45)
RDW: 19.3 % — AB (ref 11.2–14.5)
WBC: 6.6 10*3/uL (ref 3.9–10.3)

## 2014-08-15 LAB — COMPREHENSIVE METABOLIC PANEL (CC13)
ALK PHOS: 94 U/L (ref 40–150)
ANION GAP: 9 meq/L (ref 3–11)
AST: 10 U/L (ref 5–34)
Albumin: 3.4 g/dL — ABNORMAL LOW (ref 3.5–5.0)
BILIRUBIN TOTAL: 0.8 mg/dL (ref 0.20–1.20)
BUN: 16.6 mg/dL (ref 7.0–26.0)
CHLORIDE: 103 meq/L (ref 98–109)
CO2: 25 mEq/L (ref 22–29)
CREATININE: 0.8 mg/dL (ref 0.6–1.1)
Calcium: 7.8 mg/dL — ABNORMAL LOW (ref 8.4–10.4)
EGFR: 71 mL/min/{1.73_m2} — AB (ref >90)
GLUCOSE: 102 mg/dL (ref 70–140)
Potassium: 5.2 mEq/L — ABNORMAL HIGH (ref 3.5–5.1)
SODIUM: 136 meq/L (ref 136–145)
TOTAL PROTEIN: 7.4 g/dL (ref 6.4–8.3)

## 2014-08-15 LAB — HOLD TUBE, BLOOD BANK

## 2014-08-15 MED ORDER — PROCHLORPERAZINE MALEATE 10 MG PO TABS: 10.0000 mg | ORAL_TABLET | Freq: Four times a day (QID) | ORAL | Status: DC | PRN

## 2014-08-15 MED ORDER — OXYCODONE-ACETAMINOPHEN 5-325 MG PO TABS: 1.0000 | ORAL_TABLET | Freq: Four times a day (QID) | ORAL | Status: DC | PRN

## 2014-08-15 MED ORDER — HEPARIN SOD (PORK) LOCK FLUSH 100 UNIT/ML IV SOLN
500.0000 [IU] | Freq: Once | INTRAVENOUS | Status: AC
  Administered 2014-08-15: 500 [IU] via INTRAVENOUS
  Filled 2014-08-15: qty 5

## 2014-08-15 MED ORDER — SODIUM CHLORIDE 0.9 % IJ SOLN
10.0000 mL | INTRAMUSCULAR | Status: DC | PRN
  Administered 2014-08-15: 10 mL via INTRAVENOUS
  Filled 2014-08-15: qty 10

## 2014-08-15 NOTE — Telephone Encounter (Signed)
lvm for pt regarding to May appt.....mailed pt appt sched/avs adn letter

## 2014-08-15 NOTE — Progress Notes (Signed)
[place] [phone]   Fax:([phone]  OFFICE PROGRESS NOTE  [name], MD [address] [place] [address]  DIAGNOSIS: Extensive stage, stage IV (T3, N2, M1b) small cell lung cancer diagnosed in October 2015.  PRIOR THERAPY: Systemic chemotherapy with carboplatin for AUC of 5 on day 1 and etoposide 100 MG/M2 on days 1, 2 and 3 with Neulasta support on day 4. Status post 6 cycles. Starting from cycle #5 carboplatin with for AUC of 4 and etoposide 80 MG/M2  CURRENT THERAPY: None.  INTERVAL HISTORY: [name] 74 y.o. female returns to the clinic today for follow-up visit accompanied by her husband and daughter. The patient tolerated the last cycle of her systemic chemotherapy with reduced dose carboplatin and etoposide fairly well except for mild fatigue likely secondary to chemotherapy-induced anemia. She received 2 units of PRBCs transfusion after the last cycle of her treatment. She continues to have right neck and back pain. The patient also continues to have resting tremors and she was evaluated by neurology. She denied having any significant nausea or vomiting, no fever or chills. The patient denied having any significant chest pain but continues to have shortness breath with exertion was no cough or hemoptysis. She has repeat CT scan of the chest, abdomen and pelvis performed recently and she is here for evaluation and discussion of her scan results.  MEDICAL HISTORY: Past Medical History  Diagnosis Date  . GERD (gastroesophageal reflux disease)   . IBS (irritable bowel syndrome)   . Adenomatous colon polyp 06/2009  . Hiatal hernia   . Breast cyst   . Diverticulosis   . Hemorrhoids   . Hyperlipidemia   . Hypertension   . Hyperparathyroidism   . Anxiety   . Migraine   . Osteoporosis   . Vitamin D deficiency   . Spinal stenosis   . C. difficile colitis 2006  . Small cell lung carcinoma dx'd 02/2014  . Bone metastases dx'd  02/2014    ALLERGIES:  is allergic to ace inhibitors; cephalexin; etodolac; and gabapentin.  MEDICATIONS:  Current Outpatient Prescriptions  Medication Sig Dispense Refill  . ALPRAZolam (XANAX) 0.5 MG tablet Take 0.5 mg by mouth at bedtime.      Marland Kitchen atorvastatin (LIPITOR) 80 MG tablet Take 80 mg by mouth every morning.     Marland Kitchen buPROPion (WELLBUTRIN SR) 150 MG 12 hr tablet Take 150 mg by mouth every morning.     Marland Kitchen CALCIUM PO Take 1 tablet by mouth 3 (three) times daily. Starting 06/21/14--taking 600mg  three times daily    . carbidopa-levodopa (SINEMET IR) 25-100 MG per tablet Take 1 tablet by mouth 3 (three) times daily. 90 tablet 3  . DEXILANT 60 MG capsule TAKE ONE CAPSULE BY MOUTH DAILY 30 capsule 0  . Diclofenac (ZORVOLEX) 35 MG CAPS Take 35 mg by mouth every morning.     . feeding supplement, ENSURE COMPLETE, (ENSURE COMPLETE) LIQD Take 237 mLs by mouth 2 (two) times daily between meals. 237 mL 0  . glycopyrrolate (ROBINUL) 2 MG tablet TAKE 1 TABLET (2 MG TOTAL) BY MOUTH 2 (TWO) TIMES DAILY. 60 tablet 0  . hydrochlorothiazide (HYDRODIURIL) 12.5 MG tablet Take 1 tablet by mouth every morning.     Marland Kitchen KLOR-CON M20 20 MEQ tablet TAKE 1 TABLET BY MOUTH EVERY DAY 30 tablet 1  . lidocaine-prilocaine (EMLA) cream Apply 1 application topically as needed. Apply to port 1 hr before chemo 30 g 0  .  LORazepam (ATIVAN) 0.5 MG tablet Take 1 tablet (0.5 mg total) by mouth every 8 (eight) hours. 30 tablet 0  . metoprolol (TOPROL-XL) 50 MG 24 hr tablet Take 50 mg by mouth every morning.     . Polyethylene Glycol 3350 (MIRALAX PO) Take 1 scoop by mouth daily as needed (for constipation).     . prochlorperazine (COMPAZINE) 10 MG tablet Take 1 tablet (10 mg total) by mouth every 6 (six) hours as needed for nausea or vomiting. 30 tablet 1  . rotigotine (NEUPRO) 2 MG/24HR Place 1 patch onto the skin daily. 7 patch 0  . rotigotine (NEUPRO) 4 MG/24HR Place 1 patch onto the skin daily. 21 patch 0   No current  facility-administered medications for this visit.    SURGICAL HISTORY:  Past Surgical History  Procedure Laterality Date  . Cholecystectomy    . Vaginal hysterectomy    . Knee arthroscopy      left  . Parathyroidectomy  2009    [facility]  . Lumbar spine surgery  2001  . Breast biopsy      bilateral  . Total knee arthroplasty      right  . Bladder repair    . Video bronchoscopy Bilateral 03/14/2014    Procedure: VIDEO BRONCHOSCOPY WITHOUT FLUORO;  Surgeon: Tanda Rockers, MD;  Location: [facility];  Service: Cardiopulmonary;  Laterality: Bilateral;    REVIEW OF SYSTEMS:  Constitutional: positive for anorexia and fatigue Eyes: negative Ears, nose, mouth, throat, and face: negative Respiratory: positive for dyspnea on exertion Cardiovascular: negative Gastrointestinal: negative Genitourinary:negative Integument/breast: negative Hematologic/lymphatic: negative Musculoskeletal:positive for back pain and neck pain Neurological: positive for tremors Behavioral/Psych: negative Endocrine: negative Allergic/Immunologic: negative   PHYSICAL EXAMINATION: General appearance: alert, cooperative, fatigued and no distress Head: Normocephalic, without obvious abnormality, atraumatic Neck: no adenopathy, no JVD, supple, symmetrical, trachea midline and thyroid not enlarged, symmetric, no tenderness/mass/nodules Lymph nodes: Cervical, supraclavicular, and axillary nodes normal. Resp: clear to auscultation bilaterally Back: symmetric, no curvature. ROM normal. No CVA tenderness. Cardio: regular rate and rhythm, S1, S2 normal, no murmur, click, rub or gallop GI: soft, non-tender; bowel sounds normal; no masses,  no organomegaly Extremities: extremities normal, atraumatic, no cyanosis or edema Neurologic: Alert and oriented X 3, normal strength and tone. Normal symmetric reflexes. Normal coordination and gait  ECOG PERFORMANCE STATUS: 2 - Symptomatic, <50% confined to bed  Blood  pressure 118/72, pulse 100, temperature 98.5 F (36.9 C), temperature source Oral, resp. rate 18, height 5\' 5"  (1.651 m), weight 121 lb 4.8 oz (55.021 kg), SpO2 100 %.  LABORATORY DATA: Lab Results  Component Value Date   WBC 6.6 08/15/2014   HGB 8.5* 08/15/2014   HCT 26.2* 08/15/2014   MCV 93.9 08/15/2014   PLT 159 08/15/2014      Chemistry      Component Value Date/Time   NA 137 08/09/2014 0953   NA 136 05/17/2014 0455   K 5.0 08/09/2014 0953   K 3.4* 05/17/2014 0455   CL 95* 05/17/2014 0455   CO2 24 08/09/2014 0953   CO2 32 05/17/2014 0455   BUN 16.5 08/09/2014 0953   BUN 6 05/17/2014 0455   CREATININE 0.8 08/09/2014 0953   CREATININE 0.78 05/17/2014 0455      Component Value Date/Time   CALCIUM 7.7* 08/09/2014 0953   CALCIUM 7.7* 05/17/2014 0455   ALKPHOS 124 08/09/2014 0953   ALKPHOS 232* 05/13/2014 1605   AST 10 08/09/2014 0953   AST 15 05/13/2014 1605  ALT 7 08/09/2014 0953   ALT 10 05/13/2014 1605   BILITOT 0.75 08/09/2014 0953   BILITOT 1.1 05/13/2014 1605       RADIOGRAPHIC STUDIES: Ct Chest W Contrast  08/09/2014   CLINICAL DATA:  Small cell lung cancer, subsequent encounter.  EXAM: CT CHEST, ABDOMEN, AND PELVIS WITH CONTRAST  TECHNIQUE: Multidetector CT imaging of the chest, abdomen and pelvis was performed following the standard protocol during bolus administration of intravenous contrast.  CONTRAST:  34mL OMNIPAQUE IOHEXOL 300 MG/ML  SOLN  COMPARISON:  06/08/2014.  FINDINGS: CT CHEST FINDINGS  Mediastinum/Nodes: Negative.  Lungs/Pleura: Negative.  Musculoskeletal: Mild nodular thickening of the right adrenal gland, stable. Left adrenal gland is unremarkable. Low-attenuation lesions in the kidneys measure up to 3.7 cm on the left, similar to the prior exam. Statistically, cysts are most likely. Ureters are decompressed. Bladder is low in volume.  CT ABDOMEN AND PELVIS FINDINGS  Hepatobiliary: Mild intrahepatic biliary ductal dilatation is unchanged. Liver is  otherwise unremarkable. Cholecystectomy.  Pancreas: Negative.  Spleen: Negative.  Adrenals/Urinary Tract: Adrenal glands are unremarkable. Low-attenuation lesions in the kidneys measure up to 3.7 cm, similar to the prior exam. Statistically, cysts are most likely. Ureters are decompressed. Bladder is low in volume.  Stomach/Bowel: Stomach, small bowel and appendix are unremarkable. Stool is seen in the majority of the colon.  Vascular/Lymphatic: Atherosclerotic calcification of the arterial vasculature without abdominal aortic aneurysm. No pathologically enlarged lymph nodes.  Reproductive: Hysterectomy. Left ovary is visualized. Right ovary is not well visualized.  Other: No free fluid.  Mesenteries and peritoneum are unremarkable.  Musculoskeletal: T9 compression fracture is stable. Sclerotic lesions are seen throughout the osseous structures. Many appear increasingly sclerotic and new lesions are seen as well (example T7 and T8 vertebral bodies). Postoperative changes of L4-S1 posterior lumbar interbody fusion.  IMPRESSION: 1. Osseous metastatic disease appears progressive. Note is made, however, that increased sclerosis can be seen in the setting of healing metastatic disease. 2. Mild nodular thickening of the right adrenal gland, stable.   Electronically Signed   By: Lorin Picket M.D.   On: 08/09/2014 12:01   Ct Abdomen Pelvis W Contrast  08/09/2014   CLINICAL DATA:  Small cell lung cancer, subsequent encounter.  EXAM: CT CHEST, ABDOMEN, AND PELVIS WITH CONTRAST  TECHNIQUE: Multidetector CT imaging of the chest, abdomen and pelvis was performed following the standard protocol during bolus administration of intravenous contrast.  CONTRAST:  15mL OMNIPAQUE IOHEXOL 300 MG/ML  SOLN  COMPARISON:  06/08/2014.  FINDINGS: CT CHEST FINDINGS  Mediastinum/Nodes: Negative.  Lungs/Pleura: Negative.  Musculoskeletal: Mild nodular thickening of the right adrenal gland, stable. Left adrenal gland is unremarkable.  Low-attenuation lesions in the kidneys measure up to 3.7 cm on the left, similar to the prior exam. Statistically, cysts are most likely. Ureters are decompressed. Bladder is low in volume.  CT ABDOMEN AND PELVIS FINDINGS  Hepatobiliary: Mild intrahepatic biliary ductal dilatation is unchanged. Liver is otherwise unremarkable. Cholecystectomy.  Pancreas: Negative.  Spleen: Negative.  Adrenals/Urinary Tract: Adrenal glands are unremarkable. Low-attenuation lesions in the kidneys measure up to 3.7 cm, similar to the prior exam. Statistically, cysts are most likely. Ureters are decompressed. Bladder is low in volume.  Stomach/Bowel: Stomach, small bowel and appendix are unremarkable. Stool is seen in the majority of the colon.  Vascular/Lymphatic: Atherosclerotic calcification of the arterial vasculature without abdominal aortic aneurysm. No pathologically enlarged lymph nodes.  Reproductive: Hysterectomy. Left ovary is visualized. Right ovary is not well visualized.  Other: No  free fluid.  Mesenteries and peritoneum are unremarkable.  Musculoskeletal: T9 compression fracture is stable. Sclerotic lesions are seen throughout the osseous structures. Many appear increasingly sclerotic and new lesions are seen as well (example T7 and T8 vertebral bodies). Postoperative changes of L4-S1 posterior lumbar interbody fusion.  IMPRESSION: 1. Osseous metastatic disease appears progressive. Note is made, however, that increased sclerosis can be seen in the setting of healing metastatic disease. 2. Mild nodular thickening of the right adrenal gland, stable.   Electronically Signed   By: Lorin Picket M.D.   On: 08/09/2014 12:01    ASSESSMENT AND PLAN: This is a very pleasant 74 years old white female with:  1) Extensive stage small cell lung cancer currently undergoing systemic chemotherapy with carboplatin and etoposide status post 6 cycles. The patient is tolerating her treatment fairly well except for mild fatigue,  alopecia and new tremors as well as pancytopenia.  The recent CT scan of the Chest, Abdomen and pelvis showed no significant evidence for disease progression except for the osseous metastasis which is most likely secondary to a healing process. I discussed the scan results with the patient and her family and recommended for her to continue on observation for now with repeat CT scan of the chest, abdomen and pelvis in 2 months for restaging of her disease. I also discussed with the patient briefly prophylactic cranial irradiation but unfortunately with her persistent tremor and generalized weakness, she may not be a good candidate for prophylactic irradiation. I will repeat CT scan of the head with the upcoming staging workup.  2) metastatic bone disease: The patient will continue on Xgeva 120 mcg subcutaneously every 4 weeks. The patient has no dental issues and currently has dentures. She was also advised to take calcium and vitamin D supplements on daily basis.  3) chemotherapy-induced anemia, stable. We'll continue to monitor for now  4) For the tremors,  She is also followed by neurology. She will continue on Sinemet IR.  5) right neck and back pain: She was started on Percocet 5/325 mg by mouth every 6 hours as needed.  The patient was advised to call immediately if she has any concerning symptoms in the interval. The patient voices understanding of current disease status and treatment options and is in agreement with the current care plan.  All questions were answered. The patient knows to call the clinic with any problems, questions or concerns. We can certainly see the patient much sooner if necessary.   Disclaimer: This note was dictated with voice recognition software. Similar sounding words can inadvertently be transcribed and may not be corrected upon review.

## 2014-08-15 NOTE — Patient Instructions (Signed)

## 2014-08-22 ENCOUNTER — Telehealth: Payer: Self-pay | Admitting: Neurology

## 2014-08-22 NOTE — Telephone Encounter (Signed)
Pt called/returning your call 4:02PM. C/b [phone]

## 2014-08-22 NOTE — Telephone Encounter (Signed)
Left message on machine for patient to call back. To see how she is doing on Neupro patches.

## 2014-08-23 MED ORDER — ROPINIROLE HCL 0.25 MG PO TABS: ORAL_TABLET | ORAL | Status: DC

## 2014-08-23 MED ORDER — ROPINIROLE HCL 1 MG PO TABS: 1.0000 mg | ORAL_TABLET | Freq: Three times a day (TID) | ORAL | Status: DC

## 2014-08-23 NOTE — Telephone Encounter (Signed)
Those would be odd side effects from neupro.  Lets try requip.

## 2014-08-23 NOTE — Telephone Encounter (Signed)
Prescriptions for Requip0.25mg  tid x 1 week, then 0.5 mg tid x 1 week, then 1 mg tid. Sent to pharmacy. Patient made aware. We will check on her in a few weeks.

## 2014-08-23 NOTE — Telephone Encounter (Signed)
Spoke with patient and she states she stopped Neupro patches last Friday. She states she was not sleeping, having numbness in feet/legs and having itching. She states since stopping she feels much better, but wandering if there is something else she can try. Please advise.

## 2014-08-26 ENCOUNTER — Other Ambulatory Visit: Payer: Self-pay | Admitting: Gastroenterology

## 2014-09-12 ENCOUNTER — Telehealth: Payer: Self-pay | Admitting: Internal Medicine

## 2014-09-12 NOTE — Telephone Encounter (Signed)
pt called to adjust sched due to ct after MD visit....pt ok adna ware of new sched....mailed pt [place] and letter

## 2014-09-13 ENCOUNTER — Telehealth: Payer: Self-pay | Admitting: Neurology

## 2014-09-13 NOTE — Telephone Encounter (Signed)
Left message on machine for patient to call back. To see how she is doing on Requip. Awaiting call back.

## 2014-09-15 NOTE — Telephone Encounter (Signed)
Left another message on machine for patient to call back.

## 2014-09-18 NOTE — Telephone Encounter (Signed)
Called patient and spoke with daughter. She states the patient is laying down but thinks she is doing better on Requip. She will have patient call with an update.

## 2014-09-19 NOTE — Telephone Encounter (Signed)
Patient called back and states Requip working well. She does have some tremors, but she is doing pretty well. She will stay on Requip and call with any questions or problems.

## 2014-10-03 ENCOUNTER — Other Ambulatory Visit: Payer: Medicare Other

## 2014-10-06 ENCOUNTER — Other Ambulatory Visit: Payer: Self-pay | Admitting: Internal Medicine

## 2014-10-10 ENCOUNTER — Ambulatory Visit: Payer: Medicare Other | Admitting: Internal Medicine

## 2014-10-13 ENCOUNTER — Encounter (HOSPITAL_COMMUNITY): Payer: Self-pay

## 2014-10-13 ENCOUNTER — Other Ambulatory Visit (HOSPITAL_BASED_OUTPATIENT_CLINIC_OR_DEPARTMENT_OTHER): Payer: Medicare Other

## 2014-10-13 ENCOUNTER — Ambulatory Visit (HOSPITAL_COMMUNITY)
Admission: RE | Admit: 2014-10-13 | Discharge: 2014-10-13 | Disposition: A | Discharge status: Home / Self Care | Payer: Medicare Other | Source: Ambulatory Visit | Attending: Internal Medicine | Admitting: Internal Medicine

## 2014-10-13 ENCOUNTER — Other Ambulatory Visit: Payer: Self-pay | Admitting: Internal Medicine

## 2014-10-13 ENCOUNTER — Ambulatory Visit (HOSPITAL_BASED_OUTPATIENT_CLINIC_OR_DEPARTMENT_OTHER): Payer: Medicare Other

## 2014-10-13 VITALS — BP 127/88 | HR 69 | Temp 97.8°F

## 2014-10-13 DIAGNOSIS — C3491 Malignant neoplasm of unspecified part of right bronchus or lung: Secondary | ICD-10-CM | POA: Diagnosis not present

## 2014-10-13 DIAGNOSIS — C7951 Secondary malignant neoplasm of bone: Secondary | ICD-10-CM | POA: Insufficient documentation

## 2014-10-13 DIAGNOSIS — Z95828 Presence of other vascular implants and grafts: Secondary | ICD-10-CM

## 2014-10-13 DIAGNOSIS — R110 Nausea: Secondary | ICD-10-CM

## 2014-10-13 DIAGNOSIS — Z452 Encounter for adjustment and management of vascular access device: Secondary | ICD-10-CM | POA: Diagnosis not present

## 2014-10-13 DIAGNOSIS — C3490 Malignant neoplasm of unspecified part of unspecified bronchus or lung: Secondary | ICD-10-CM | POA: Diagnosis not present

## 2014-10-13 LAB — COMPREHENSIVE METABOLIC PANEL (CC13)
AST: 14 U/L (ref 5–34)
Albumin: 3.9 g/dL (ref 3.5–5.0)
Alkaline Phosphatase: 90 U/L (ref 40–150)
Anion Gap: 11 mEq/L (ref 3–11)
BUN: 27.6 mg/dL — ABNORMAL HIGH (ref 7.0–26.0)
CALCIUM: 9.7 mg/dL (ref 8.4–10.4)
CHLORIDE: 97 meq/L — AB (ref 98–109)
CO2: 27 mEq/L (ref 22–29)
Creatinine: 1.2 mg/dL — ABNORMAL HIGH (ref 0.6–1.1)
EGFR: 44 mL/min/{1.73_m2} — ABNORMAL LOW (ref >90)
Glucose: 89 mg/dl (ref 70–140)
Potassium: 4.8 mEq/L (ref 3.5–5.1)
Sodium: 135 mEq/L — ABNORMAL LOW (ref 136–145)
TOTAL PROTEIN: 7.4 g/dL (ref 6.4–8.3)
Total Bilirubin: 0.77 mg/dL (ref 0.20–1.20)

## 2014-10-13 LAB — CBC WITH DIFFERENTIAL/PLATELET
BASO%: 0.2 % (ref 0.0–2.0)
Basophils Absolute: 0 10*3/uL (ref 0.0–0.1)
EOS%: 1.1 % (ref 0.0–7.0)
Eosinophils Absolute: 0.1 10*3/uL (ref 0.0–0.5)
HCT: 33.8 % — ABNORMAL LOW (ref 34.8–46.6)
HGB: 11.7 g/dL (ref 11.6–15.9)
LYMPH%: 22.1 % (ref 14.0–49.7)
MCH: 32.1 pg (ref 25.1–34.0)
MCHC: 34.6 g/dL (ref 31.5–36.0)
MCV: 92.6 fL (ref 79.5–101.0)
MONO#: 0.8 10*3/uL (ref 0.1–0.9)
MONO%: 10.2 % (ref 0.0–14.0)
NEUT#: 5.4 10*3/uL (ref 1.5–6.5)
NEUT%: 66.4 % (ref 38.4–76.8)
Platelets: 135 10*3/uL — ABNORMAL LOW (ref 145–400)
RBC: 3.65 10*6/uL — ABNORMAL LOW (ref 3.70–5.45)
RDW: 13.7 % (ref 11.2–14.5)
WBC: 8.1 10*3/uL (ref 3.9–10.3)
lymph#: 1.8 10*3/uL (ref 0.9–3.3)

## 2014-10-13 MED ORDER — SODIUM CHLORIDE 0.9 % IJ SOLN
10.0000 mL | INTRAMUSCULAR | Status: DC | PRN
  Administered 2014-10-13: 10 mL via INTRAVENOUS
  Filled 2014-10-13: qty 10

## 2014-10-13 MED ORDER — HEPARIN SOD (PORK) LOCK FLUSH 100 UNIT/ML IV SOLN
500.0000 [IU] | Freq: Once | INTRAVENOUS | Status: AC
  Administered 2014-10-13: 500 [IU] via INTRAVENOUS
  Filled 2014-10-13: qty 5

## 2014-10-13 MED ORDER — IOHEXOL 300 MG/ML  SOLN
100.0000 mL | Freq: Once | INTRAMUSCULAR | Status: AC | PRN
  Administered 2014-10-13: 100 mL via INTRAVENOUS

## 2014-10-13 NOTE — Progress Notes (Signed)
Patient in for [facility] access and labs today. Patient's PAC accessed and flushed without any difficulty. Blood return noted with flush and labs obtained at that time. Patient has a CT appointment today. PAC left accessed and covered with a Tegaderm dressing. Instructed Patient to have PAC needle removed by CT tech after CT scan or to return to the [place] to have needle removed after CT scan. Patient verbalized understanding. Patient discharged from the Flush Room without any concerns.

## 2014-10-13 NOTE — Patient Instructions (Signed)

## 2014-10-16 ENCOUNTER — Other Ambulatory Visit: Payer: Self-pay | Admitting: Neurology

## 2014-10-17 NOTE — Telephone Encounter (Signed)
Carbidopa Levodopa 25/100 refill requested. Per last office note- patient to remain on medication. Refill approved and sent to patient's pharmacy.   

## 2014-10-18 ENCOUNTER — Telehealth: Payer: Self-pay | Admitting: Internal Medicine

## 2014-10-18 ENCOUNTER — Ambulatory Visit (HOSPITAL_BASED_OUTPATIENT_CLINIC_OR_DEPARTMENT_OTHER): Payer: Medicare Other | Admitting: Internal Medicine

## 2014-10-18 ENCOUNTER — Other Ambulatory Visit: Payer: Self-pay | Admitting: Neurology

## 2014-10-18 ENCOUNTER — Encounter: Payer: Self-pay | Admitting: Internal Medicine

## 2014-10-18 VITALS — BP 118/65 | HR 67 | Temp 97.9°F | Resp 18 | Ht 65.0 in | Wt 127.2 lb

## 2014-10-18 DIAGNOSIS — C3490 Malignant neoplasm of unspecified part of unspecified bronchus or lung: Secondary | ICD-10-CM

## 2014-10-18 DIAGNOSIS — C3491 Malignant neoplasm of unspecified part of right bronchus or lung: Secondary | ICD-10-CM | POA: Diagnosis not present

## 2014-10-18 DIAGNOSIS — R251 Tremor, unspecified: Secondary | ICD-10-CM

## 2014-10-18 DIAGNOSIS — C7951 Secondary malignant neoplasm of bone: Secondary | ICD-10-CM | POA: Diagnosis not present

## 2014-10-18 DIAGNOSIS — D6481 Anemia due to antineoplastic chemotherapy: Secondary | ICD-10-CM

## 2014-10-18 DIAGNOSIS — M542 Cervicalgia: Secondary | ICD-10-CM

## 2014-10-18 MED ORDER — CARBIDOPA-LEVODOPA 25-100 MG PO TABS: 1.0000 | ORAL_TABLET | Freq: Three times a day (TID) | ORAL | Status: AC

## 2014-10-18 NOTE — Telephone Encounter (Signed)
Gave patient avs report and appointments for June and July. 6/15 f/u scheduled with [facility] due to [place] booked - patient has a 7hr tx and lives in [place].

## 2014-10-18 NOTE — Telephone Encounter (Signed)
Patient request 90 day supply of Carbidopa Levodopa. RX sent.

## 2014-10-18 NOTE — Progress Notes (Signed)
[place] [phone]   [phone]  OFFICE PROGRESS NOTE  [name], MD [address] [place] [address]  DIAGNOSIS: Extensive stage, stage IV (T3, N2, M1b) small cell lung cancer diagnosed in October 2015.  PRIOR THERAPY: Systemic chemotherapy with carboplatin for AUC of 5 on day 1 and etoposide 100 MG/M2 on days 1, 2 and 3 with Neulasta support on day 4. Status post 6 cycles. Starting from cycle #5 carboplatin with for AUC of 4 and etoposide 80 MG/M2  CURRENT THERAPY: None.  INTERVAL HISTORY: [name] 74 y.o. female returns to the clinic today for follow-up visit accompanied by her husband and daughter. The patient has been observation for the last 2 months. She is feeling a little bit better but continues to complain of back pain. The patient also continues to have resting tremors and she was evaluated by neurology. She denied having any significant nausea or vomiting, no fever or chills. The patient denied having any significant chest pain but continues to have shortness breath with exertion was no cough or hemoptysis. She has repeat CT scan of the chest, abdomen and pelvis performed recently and she is here for evaluation and discussion of her scan results.  MEDICAL HISTORY: Past Medical History  Diagnosis Date  . GERD (gastroesophageal reflux disease)   . IBS (irritable bowel syndrome)   . Adenomatous colon polyp 06/2009  . Hiatal hernia   . Breast cyst   . Diverticulosis   . Hemorrhoids   . Hyperlipidemia   . Hypertension   . Hyperparathyroidism   . Anxiety   . Migraine   . Osteoporosis   . Vitamin D deficiency   . Spinal stenosis   . C. difficile colitis 2006  . Small cell lung carcinoma dx'd 02/2014  . Bone metastases dx'd 02/2014    ALLERGIES:  is allergic to ace inhibitors; cephalexin; etodolac; and gabapentin.  MEDICATIONS:  Current Outpatient Prescriptions  Medication Sig Dispense Refill  . ALPRAZolam  (XANAX) 0.5 MG tablet Take 0.5 mg by mouth at bedtime.      Marland Kitchen atorvastatin (LIPITOR) 80 MG tablet Take 80 mg by mouth every morning.     Marland Kitchen buPROPion (WELLBUTRIN SR) 150 MG 12 hr tablet Take 150 mg by mouth every morning.     Marland Kitchen CALCIUM PO Take 1 tablet by mouth 3 (three) times daily. Starting 06/21/14--taking '600mg'$  three times daily    . carbidopa-levodopa (SINEMET IR) 25-100 MG per tablet Take 1 tablet by mouth 3 (three) times daily. 270 tablet 1  . DEXILANT 60 MG capsule TAKE ONE CAPSULE BY MOUTH DAILY 30 capsule 0  . Diclofenac (ZORVOLEX) 35 MG CAPS Take 35 mg by mouth every morning.     . feeding supplement, ENSURE COMPLETE, (ENSURE COMPLETE) LIQD Take 237 mLs by mouth 2 (two) times daily between meals. 237 mL 0  . glycopyrrolate (ROBINUL) 2 MG tablet TAKE 1 TABLET (2 MG TOTAL) BY MOUTH 2 (TWO) TIMES DAILY. 60 tablet 0  . hydrochlorothiazide (HYDRODIURIL) 12.5 MG tablet Take 1 tablet by mouth every morning.     Marland Kitchen KLOR-CON M20 20 MEQ tablet TAKE 1 TABLET BY MOUTH EVERY DAY 30 tablet 1  . lidocaine-prilocaine (EMLA) cream Apply 1 application topically as needed. Apply to port 1 hr before chemo 30 g 0  . LORazepam (ATIVAN) 0.5 MG tablet Take 1 tablet (0.5 mg total) by mouth every 8 (eight) hours. 30 tablet 0  . metoprolol (TOPROL-XL) 50 MG  24 hr tablet Take 50 mg by mouth every morning.     Marland Kitchen oxyCODONE-acetaminophen (PERCOCET/ROXICET) 5-325 MG per tablet Take 1 tablet by mouth every 6 (six) hours as needed for severe pain. 30 tablet 0  . Polyethylene Glycol 3350 (MIRALAX PO) Take 1 scoop by mouth daily as needed (for constipation).     . prochlorperazine (COMPAZINE) 10 MG tablet Take 1 tablet (10 mg total) by mouth every 6 (six) hours as needed for nausea or vomiting. 30 tablet 1  . rOPINIRole (REQUIP) 0.25 MG tablet Take one tablet TID for one week, then 2 tablets TID for one week, then fill 1 mg prescription 63 tablet 0  . rOPINIRole (REQUIP) 1 MG tablet Take 1 tablet (1 mg total) by mouth 3  (three) times daily. 90 tablet 1  . rotigotine (NEUPRO) 2 MG/24HR Place 1 patch onto the skin daily. 7 patch 0  . rotigotine (NEUPRO) 4 MG/24HR Place 1 patch onto the skin daily. 21 patch 0   No current facility-administered medications for this visit.    SURGICAL HISTORY:  Past Surgical History  Procedure Laterality Date  . Cholecystectomy    . Vaginal hysterectomy    . Knee arthroscopy      left  . Parathyroidectomy  2009    [facility]  . Lumbar spine surgery  2001  . Breast biopsy      bilateral  . Total knee arthroplasty      right  . Bladder repair    . Video bronchoscopy Bilateral 03/14/2014    Procedure: VIDEO BRONCHOSCOPY WITHOUT FLUORO;  Surgeon: Tanda Rockers, MD;  Location: [facility];  Service: Cardiopulmonary;  Laterality: Bilateral;    REVIEW OF SYSTEMS:  Constitutional: positive for fatigue Eyes: negative Ears, nose, mouth, throat, and face: negative Respiratory: positive for dyspnea on exertion Cardiovascular: negative Gastrointestinal: negative Genitourinary:negative Integument/breast: negative Hematologic/lymphatic: negative Musculoskeletal:positive for back pain Neurological: positive for tremors Behavioral/Psych: negative Endocrine: negative Allergic/Immunologic: negative   PHYSICAL EXAMINATION: General appearance: alert, cooperative, fatigued and no distress Head: Normocephalic, without obvious abnormality, atraumatic Neck: no adenopathy, no JVD, supple, symmetrical, trachea midline and thyroid not enlarged, symmetric, no tenderness/mass/nodules Lymph nodes: Cervical, supraclavicular, and axillary nodes normal. Resp: clear to auscultation bilaterally Back: symmetric, no curvature. ROM normal. No CVA tenderness. Cardio: regular rate and rhythm, S1, S2 normal, no murmur, click, rub or gallop GI: soft, non-tender; bowel sounds normal; no masses,  no organomegaly Extremities: extremities normal, atraumatic, no cyanosis or edema Neurologic:  Alert and oriented X 3, normal strength and tone. Normal symmetric reflexes. Normal coordination and gait  ECOG PERFORMANCE STATUS: 2 - Symptomatic, <50% confined to bed  Blood pressure 118/65, pulse 67, temperature 97.9 F (36.6 C), temperature source Oral, resp. rate 18, height '5\' 5"'$  (1.651 m), weight 127 lb 3.2 oz (57.698 kg), SpO2 100 %.  LABORATORY DATA: Lab Results  Component Value Date   WBC 8.1 10/13/2014   HGB 11.7 10/13/2014   HCT 33.8* 10/13/2014   MCV 92.6 10/13/2014   PLT 135* 10/13/2014      Chemistry      Component Value Date/Time   NA 135* 10/13/2014 1220   NA 136 05/17/2014 0455   K 4.8 10/13/2014 1220   K 3.4* 05/17/2014 0455   CL 95* 05/17/2014 0455   CO2 27 10/13/2014 1220   CO2 32 05/17/2014 0455   BUN 27.6* 10/13/2014 1220   BUN 6 05/17/2014 0455   CREATININE 1.2* 10/13/2014 1220   CREATININE 0.78 05/17/2014 0455  Component Value Date/Time   CALCIUM 9.7 10/13/2014 1220   CALCIUM 7.7* 05/17/2014 0455   ALKPHOS 90 10/13/2014 1220   ALKPHOS 232* 05/13/2014 1605   AST 14 10/13/2014 1220   AST 15 05/13/2014 1605   ALT <6 10/13/2014 1220   ALT 10 05/13/2014 1605   BILITOT 0.77 10/13/2014 1220   BILITOT 1.1 05/13/2014 1605       RADIOGRAPHIC STUDIES: Ct Head W Wo Contrast  10/13/2014   CLINICAL DATA:  Squamous cell carcinoma lung with bone Mets. Staging.  EXAM: CT HEAD WITHOUT AND WITH CONTRAST  TECHNIQUE: Contiguous axial images were obtained from the base of the skull through the vertex without and with intravenous contrast  CONTRAST:  146m OMNIPAQUE IOHEXOL 300 MG/ML  SOLN  COMPARISON:  MR brain 03/31/14  FINDINGS: No evidence for acute infarction, hemorrhage, mass lesion, hydrocephalus, or extra-axial fluid. Mild atrophy. Mild white matter disease. Post infusion, no abnormal enhancement.  Osseous disease is present. There are small foci of sclerosis in the clivus, probably representing metastatic disease (treated?). Within the calvarium,  abnormal lucencies predominate. No intracranial epidural tumor however. This appearance is stable from prior MR.  Negative orbits.  Negative sinuses.  IMPRESSION: No evidence metastatic disease to the brain or dura.  Osseous metastatic disease to the skull and skull base as described. No features concerning for epidural tumor however.   Electronically Signed   By: JRolla FlattenM.D.   On: 10/13/2014 14:35   Ct Chest W Contrast  10/13/2014   CLINICAL DATA:  Patient with metastatic small cell lung cancer.  EXAM: CT CHEST, ABDOMEN, AND PELVIS WITH CONTRAST  TECHNIQUE: Multidetector CT imaging of the chest, abdomen and pelvis was performed following the standard protocol during bolus administration of intravenous contrast.  CONTRAST:  1049mOMNIPAQUE IOHEXOL 300 MG/ML  SOLN  COMPARISON:  CT CAP 08/09/2014  FINDINGS: CT CHEST FINDINGS  Mediastinum/Nodes: Right anterior chest wall Port-A-Cath is present with tip terminating at the superior cavoatrial junction. No enlarged axillary lymphadenopathy. The heart is normal in size. Trace fluid within the superior pericardial recess. Interval enlargement of now 15 mm pretracheal lymph node (image 22; series 5), previously 8 mm. Multiple new right paratracheal and pretracheal lymph nodes are demonstrated including a 6 mm right peritracheal lymph node (image 11; series 5). Oral contrast is demonstrated within the esophagus. Interval development of a 11 x 18 mm heterogeneous enhancing soft tissue mass anterior to the T11 vertebral body (image 42; series 5). Additionally there is a new 31 x 12 mm soft tissue mass adjacent to the right aspect of the T11 vertebral body. New 16 mm lymph node adjacent to the right bronchus (image 29; series 5).  Lungs/Pleura: Central airways are patent. Interval development of a 20 x 6 mm subpleural nodule within the right lower lobe (image 34; series 7) and a 12 x 11 mm nodule within the right lower lobe (image 31; series 7). Multiple new  predominantly subpleural nodules are demonstrated within the right hemi thorax most pronounced along the diaphragm and anterior aspect of the right middle and right upper lobes. 7 mm irregular nodule within the right upper lobe (image 20; series 7). No pleural effusion or pneumothorax.  Musculoskeletal: Extensive sclerotic lesions are demonstrated throughout the visualized osseous skeleton. Many of these lesions are slightly increased in sclerosis. Evaluation for new lesions is difficult given the extensive number of lesions however no definite new large lesions are identified. Postsurgical changes L4-S1.  CT ABDOMEN AND PELVIS FINDINGS  Hepatobiliary: Liver is normal in size and contour. Re- demonstrated mild intrahepatic biliary ductal dilatation. Patient status post cholecystectomy. No extrahepatic biliary ductal dilatation.  Pancreas: Unremarkable  Spleen: Unremarkable  Adrenals/Urinary Tract: Stable thickening of the bilateral adrenal glands. Kidneys are symmetric in size and enhance appropriately. Bilateral renal cysts are stable.  Stomach/Bowel: Sigmoid colonic diverticulosis. No CT evidence for acute diverticulitis. No evidence for bowel obstruction. No free fluid or free intraperitoneal air.  Vascular/Lymphatic: Stable infrarenal abdominal aortic ectasia measuring 2.6 cm. The abdominal aorta is peripherally calcified. No retroperitoneal lymphadenopathy.  Other: None  Musculoskeletal: Extensive sclerotic lesions are demonstrated throughout the visualized osseous skeleton. Many of these lesions are slightly increased in sclerosis. Evaluation for new lesions is difficult given the extensive number of lesions however no definite new large lesions are identified. Postsurgical changes L4-S1.  IMPRESSION: Interval progression of metastatic disease with interval development of bulky mediastinal adenopathy, multiple new pulmonary nodules and new paraspinal soft tissue masses.  Extensive sclerotic lesions are  demonstrated throughout the visualized osseous skeleton. Many of these lesions are slightly increased in sclerosis. Evaluation for new lesions is difficult given the extensive number of lesions.   Electronically Signed   By: Lovey Newcomer M.D.   On: 10/13/2014 14:51   Ct Abdomen Pelvis W Contrast  10/13/2014   CLINICAL DATA:  Patient with metastatic small cell lung cancer.  EXAM: CT CHEST, ABDOMEN, AND PELVIS WITH CONTRAST  TECHNIQUE: Multidetector CT imaging of the chest, abdomen and pelvis was performed following the standard protocol during bolus administration of intravenous contrast.  CONTRAST:  181m OMNIPAQUE IOHEXOL 300 MG/ML  SOLN  COMPARISON:  CT CAP 08/09/2014  FINDINGS: CT CHEST FINDINGS  Mediastinum/Nodes: Right anterior chest wall Port-A-Cath is present with tip terminating at the superior cavoatrial junction. No enlarged axillary lymphadenopathy. The heart is normal in size. Trace fluid within the superior pericardial recess. Interval enlargement of now 15 mm pretracheal lymph node (image 22; series 5), previously 8 mm. Multiple new right paratracheal and pretracheal lymph nodes are demonstrated including a 6 mm right peritracheal lymph node (image 11; series 5). Oral contrast is demonstrated within the esophagus. Interval development of a 11 x 18 mm heterogeneous enhancing soft tissue mass anterior to the T11 vertebral body (image 42; series 5). Additionally there is a new 31 x 12 mm soft tissue mass adjacent to the right aspect of the T11 vertebral body. New 16 mm lymph node adjacent to the right bronchus (image 29; series 5).  Lungs/Pleura: Central airways are patent. Interval development of a 20 x 6 mm subpleural nodule within the right lower lobe (image 34; series 7) and a 12 x 11 mm nodule within the right lower lobe (image 31; series 7). Multiple new predominantly subpleural nodules are demonstrated within the right hemi thorax most pronounced along the diaphragm and anterior aspect of the  right middle and right upper lobes. 7 mm irregular nodule within the right upper lobe (image 20; series 7). No pleural effusion or pneumothorax.  Musculoskeletal: Extensive sclerotic lesions are demonstrated throughout the visualized osseous skeleton. Many of these lesions are slightly increased in sclerosis. Evaluation for new lesions is difficult given the extensive number of lesions however no definite new large lesions are identified. Postsurgical changes L4-S1.  CT ABDOMEN AND PELVIS FINDINGS  Hepatobiliary: Liver is normal in size and contour. Re- demonstrated mild intrahepatic biliary ductal dilatation. Patient status post cholecystectomy. No extrahepatic biliary ductal dilatation.  Pancreas: Unremarkable  Spleen: Unremarkable  Adrenals/Urinary Tract: Stable  thickening of the bilateral adrenal glands. Kidneys are symmetric in size and enhance appropriately. Bilateral renal cysts are stable.  Stomach/Bowel: Sigmoid colonic diverticulosis. No CT evidence for acute diverticulitis. No evidence for bowel obstruction. No free fluid or free intraperitoneal air.  Vascular/Lymphatic: Stable infrarenal abdominal aortic ectasia measuring 2.6 cm. The abdominal aorta is peripherally calcified. No retroperitoneal lymphadenopathy.  Other: None  Musculoskeletal: Extensive sclerotic lesions are demonstrated throughout the visualized osseous skeleton. Many of these lesions are slightly increased in sclerosis. Evaluation for new lesions is difficult given the extensive number of lesions however no definite new large lesions are identified. Postsurgical changes L4-S1.  IMPRESSION: Interval progression of metastatic disease with interval development of bulky mediastinal adenopathy, multiple new pulmonary nodules and new paraspinal soft tissue masses.  Extensive sclerotic lesions are demonstrated throughout the visualized osseous skeleton. Many of these lesions are slightly increased in sclerosis. Evaluation for new lesions is  difficult given the extensive number of lesions.   Electronically Signed   By: Lovey Newcomer M.D.   On: 10/13/2014 14:51    ASSESSMENT AND PLAN: This is a very pleasant 74 years old white female with:  1) Extensive stage small cell lung cancer currently undergoing systemic chemotherapy with carboplatin and etoposide status post 6 cycles. She had good response to the first line treatment and she has been on observation for the last 2 months. The recent CT scan of the chest, abdomen and pelvis showed interval progression of the metastatic disease with bulky mediastinal lymph nodes as well as multiple new pulmonary nodules and new paraspinal soft tissue masses as well as extensive bone metastasis. I had a lengthy discussion with the patient and her family today about her current disease status and treatment options. I gave the patient the option of palliative care and hospice referral versus consideration of second line chemotherapy with either reduced dose cisplatin 30 MG/M2 and irinotecan 65 MG/M2 on days 1 and 8 every 3 weeks versus weekly topotecan. After discussion of the 3 options, the patient and her family are interested in proceeding with treatment with cisplatin and irinotecan. I discussed with the patient adverse effect of this treatment including but not limited to alopecia, myelosuppression, nausea and vomiting, peripheral neuropathy, liver or renal dysfunction. She is expected to start the first cycle of her treatment next week. The patient would come back for follow-up visit in 2 weeks for reevaluation and management of any adverse effect of her treatment.  2) metastatic bone disease: The patient will continue on Xgeva 120 mcg subcutaneously every 4 weeks. The patient has no dental issues and currently has dentures. She was also advised to take calcium and vitamin D supplements on daily basis.  3) chemotherapy-induced anemia, stable. We'll continue to monitor for now  4) For the tremors,   She is also followed by neurology. She will continue on Sinemet IR.  5) right neck and back pain: She was started on Percocet 5/325 mg by mouth every 6 hours as needed. I also recommended for the patient referral to radiation oncology for consideration of palliative radiotherapy but she would like to hold on this option for now.  The patient was advised to call immediately if she has any concerning symptoms in the interval. The patient voices understanding of current disease status and treatment options and is in agreement with the current care plan.  All questions were answered. The patient knows to call the clinic with any problems, questions or concerns. We can certainly see the patient  much sooner if necessary.   Disclaimer: This note was dictated with voice recognition software. Similar sounding words can inadvertently be transcribed and may not be corrected upon review.

## 2014-10-25 ENCOUNTER — Other Ambulatory Visit (HOSPITAL_BASED_OUTPATIENT_CLINIC_OR_DEPARTMENT_OTHER): Payer: Medicare Other

## 2014-10-25 ENCOUNTER — Ambulatory Visit: Payer: Medicare Other

## 2014-10-25 ENCOUNTER — Other Ambulatory Visit: Payer: Self-pay | Admitting: *Deleted

## 2014-10-25 ENCOUNTER — Ambulatory Visit (HOSPITAL_BASED_OUTPATIENT_CLINIC_OR_DEPARTMENT_OTHER): Payer: Medicare Other

## 2014-10-25 VITALS — BP 130/64 | HR 65 | Temp 97.7°F | Resp 17

## 2014-10-25 DIAGNOSIS — Z95828 Presence of other vascular implants and grafts: Secondary | ICD-10-CM

## 2014-10-25 DIAGNOSIS — C7951 Secondary malignant neoplasm of bone: Secondary | ICD-10-CM

## 2014-10-25 DIAGNOSIS — C3491 Malignant neoplasm of unspecified part of right bronchus or lung: Secondary | ICD-10-CM | POA: Diagnosis not present

## 2014-10-25 DIAGNOSIS — C3490 Malignant neoplasm of unspecified part of unspecified bronchus or lung: Secondary | ICD-10-CM

## 2014-10-25 DIAGNOSIS — Z5111 Encounter for antineoplastic chemotherapy: Secondary | ICD-10-CM

## 2014-10-25 LAB — CBC WITH DIFFERENTIAL/PLATELET
BASO%: 0.2 % (ref 0.0–2.0)
Basophils Absolute: 0 10*3/uL (ref 0.0–0.1)
EOS%: 2.5 % (ref 0.0–7.0)
Eosinophils Absolute: 0.1 10*3/uL (ref 0.0–0.5)
HEMATOCRIT: 30.7 % — AB (ref 34.8–46.6)
HEMOGLOBIN: 10.4 g/dL — AB (ref 11.6–15.9)
LYMPH#: 1.1 10*3/uL (ref 0.9–3.3)
LYMPH%: 25.5 % (ref 14.0–49.7)
MCH: 31.4 pg (ref 25.1–34.0)
MCHC: 33.9 g/dL (ref 31.5–36.0)
MCV: 92.7 fL (ref 79.5–101.0)
MONO#: 0.4 10*3/uL (ref 0.1–0.9)
MONO%: 9.2 % (ref 0.0–14.0)
NEUT#: 2.8 10*3/uL (ref 1.5–6.5)
NEUT%: 62.6 % (ref 38.4–76.8)
Platelets: 151 10*3/uL (ref 145–400)
RBC: 3.31 10*6/uL — AB (ref 3.70–5.45)
RDW: 13.1 % (ref 11.2–14.5)
WBC: 4.5 10*3/uL (ref 3.9–10.3)

## 2014-10-25 LAB — COMPREHENSIVE METABOLIC PANEL (CC13)
ALBUMIN: 3.4 g/dL — AB (ref 3.5–5.0)
ALT: <6 U/L (ref 0–55)
AST: 14 U/L (ref 5–34)
Alkaline Phosphatase: 88 U/L (ref 40–150)
Anion Gap: 9 mEq/L (ref 3–11)
BUN: 15.7 mg/dL (ref 7.0–26.0)
CALCIUM: 8.3 mg/dL — AB (ref 8.4–10.4)
CHLORIDE: 101 meq/L (ref 98–109)
CO2: 29 meq/L (ref 22–29)
Creatinine: 0.9 mg/dL (ref 0.6–1.1)
EGFR: 64 mL/min/{1.73_m2} — AB (ref >90)
Glucose: 118 mg/dl (ref 70–140)
Potassium: 3.5 mEq/L (ref 3.5–5.1)
Sodium: 139 mEq/L (ref 136–145)
TOTAL PROTEIN: 6.8 g/dL (ref 6.4–8.3)
Total Bilirubin: 0.45 mg/dL (ref 0.20–1.20)

## 2014-10-25 MED ORDER — SODIUM CHLORIDE 0.9 % IV SOLN
Freq: Once | INTRAVENOUS | Status: AC
  Administered 2014-10-25: 13:00:00 via INTRAVENOUS
  Filled 2014-10-25: qty 5

## 2014-10-25 MED ORDER — POTASSIUM CHLORIDE 2 MEQ/ML IV SOLN
Freq: Once | INTRAVENOUS | Status: AC
  Administered 2014-10-25: 09:00:00 via INTRAVENOUS
  Filled 2014-10-25: qty 10

## 2014-10-25 MED ORDER — DENOSUMAB 120 MG/1.7ML ~~LOC~~ SOLN
120.0000 mg | Freq: Once | SUBCUTANEOUS | Status: AC
  Administered 2014-10-25: 120 mg via SUBCUTANEOUS
  Filled 2014-10-25: qty 1.7

## 2014-10-25 MED ORDER — PALONOSETRON HCL INJECTION 0.25 MG/5ML
0.2500 mg | Freq: Once | INTRAVENOUS | Status: AC
  Administered 2014-10-25: 0.25 mg via INTRAVENOUS

## 2014-10-25 MED ORDER — SODIUM CHLORIDE 0.9 % IJ SOLN
10.0000 mL | INTRAMUSCULAR | Status: DC | PRN
  Administered 2014-10-25: 10 mL via INTRAVENOUS
  Filled 2014-10-25: qty 10

## 2014-10-25 MED ORDER — SODIUM CHLORIDE 0.9 % IV SOLN
Freq: Once | INTRAVENOUS | Status: AC
  Administered 2014-10-25: 09:00:00 via INTRAVENOUS

## 2014-10-25 MED ORDER — HEPARIN SOD (PORK) LOCK FLUSH 100 UNIT/ML IV SOLN
500.0000 [IU] | Freq: Once | INTRAVENOUS | Status: DC
  Filled 2014-10-25: qty 5

## 2014-10-25 MED ORDER — CISPLATIN CHEMO INJECTION 100MG/100ML
25.0000 mg/m2 | Freq: Once | INTRAVENOUS | Status: AC
  Administered 2014-10-25: 41 mg via INTRAVENOUS
  Filled 2014-10-25: qty 41

## 2014-10-25 MED ORDER — PALONOSETRON HCL INJECTION 0.25 MG/5ML
INTRAVENOUS | Status: AC
  Filled 2014-10-25: qty 5

## 2014-10-25 MED ORDER — ATROPINE SULFATE 1 MG/ML IJ SOLN
INTRAMUSCULAR | Status: AC
  Filled 2014-10-25: qty 1

## 2014-10-25 MED ORDER — IRINOTECAN HCL CHEMO INJECTION 100 MG/5ML
61.0000 mg/m2 | Freq: Once | INTRAVENOUS | Status: AC
  Administered 2014-10-25: 100 mg via INTRAVENOUS
  Filled 2014-10-25: qty 5

## 2014-10-25 MED ORDER — SODIUM CHLORIDE 0.9 % IJ SOLN
10.0000 mL | INTRAMUSCULAR | Status: DC | PRN
  Administered 2014-10-25: 10 mL
  Filled 2014-10-25: qty 10

## 2014-10-25 MED ORDER — HEPARIN SOD (PORK) LOCK FLUSH 100 UNIT/ML IV SOLN
500.0000 [IU] | Freq: Once | INTRAVENOUS | Status: AC | PRN
  Administered 2014-10-25: 500 [IU]
  Filled 2014-10-25: qty 5

## 2014-10-25 MED ORDER — ATROPINE SULFATE 1 MG/ML IJ SOLN
0.5000 mg | Freq: Once | INTRAMUSCULAR | Status: AC | PRN
  Administered 2014-10-25: 0.5 mg via INTRAVENOUS

## 2014-10-25 NOTE — Patient Instructions (Signed)
[facility] Discharge Instructions for Patients Receiving Chemotherapy  Today you received the following chemotherapy agents: Irinotecan, Cisplatin, Xgeva   To help prevent nausea and vomiting after your treatment, we encourage you to take your nausea medication as directed.    If you develop nausea and vomiting that is not controlled by your nausea medication, call the clinic.   BELOW ARE SYMPTOMS THAT SHOULD BE REPORTED IMMEDIATELY:  *FEVER GREATER THAN 100.5 F  *CHILLS WITH OR WITHOUT FEVER  NAUSEA AND VOMITING THAT IS NOT CONTROLLED WITH YOUR NAUSEA MEDICATION  *UNUSUAL SHORTNESS OF BREATH  *UNUSUAL BRUISING OR BLEEDING  TENDERNESS IN MOUTH AND THROAT WITH OR WITHOUT PRESENCE OF ULCERS  *URINARY PROBLEMS  *BOWEL PROBLEMS  UNUSUAL RASH Items with * indicate a potential emergency and should be followed up as soon as possible.  Feel free to call the clinic you have any questions or concerns. The clinic phone number is [phone].  Please show the [facility] at check-in to the Emergency Department and triage nurse.    Irinotecan injection What is this medicine? IRINOTECAN (ir in oh TEE kan ) is a chemotherapy drug. It is used to treat colon and rectal cancer. This medicine may be used for other purposes; ask your health care provider or pharmacist if you have questions. COMMON BRAND NAME(S): Camptosar What should I tell my health care provider before I take this medicine? They need to know if you have any of these conditions: -blood disorders -dehydration -diarrhea -infection (especially a virus infection such as chickenpox, cold sores, or herpes) -liver disease -low blood counts, like low white cell, platelet, or red cell counts -recent or ongoing radiation therapy -an unusual or allergic reaction to irinotecan, sorbitol, other chemotherapy, other medicines, foods, dyes, or preservatives -pregnant or trying to get  pregnant -breast-feeding How should I use this medicine? This drug is given as an infusion into a vein. It is administered in a hospital or clinic by a specially trained health care professional. Talk to your pediatrician regarding the use of this medicine in children. Special care may be needed. Overdosage: If you think you have taken too much of this medicine contact a poison control center or emergency room at once. NOTE: This medicine is only for you. Do not share this medicine with others. What if I miss a dose? It is important not to miss your dose. Call your doctor or health care professional if you are unable to keep an appointment. What may interact with this medicine? Do not take this medicine with any of the following medications: -atazanavir -certain medicines for fungal infections like itraconazole and ketoconazole -St. John's Wort This medicine may also interact with the following medications: -dexamethasone -diuretics -laxatives -medicines for seizures like carbamazepine, mephobarbital, phenobarbital, phenytoin, primidone -medicines to increase blood counts like filgrastim, pegfilgrastim, sargramostim -prochlorperazine -vaccines This list may not describe all possible interactions. Give your health care provider a list of all the medicines, herbs, non-prescription drugs, or dietary supplements you use. Also tell them if you smoke, drink alcohol, or use illegal drugs. Some items may interact with your medicine. What should I watch for while using this medicine? Your condition will be monitored carefully while you are receiving this medicine. You will need important blood work done while you are taking this medicine. This drug may make you feel generally unwell. This is not uncommon, as chemotherapy can affect healthy cells as well as cancer cells. Report any side effects. Continue your course of  treatment even though you feel ill unless your doctor tells you to stop. In some  cases, you may be given additional medicines to help with side effects. Follow all directions for their use. You may get drowsy or dizzy. Do not drive, use machinery, or do anything that needs mental alertness until you know how this medicine affects you. Do not stand or sit up quickly, especially if you are an older patient. This reduces the risk of dizzy or fainting spells. Call your doctor or health care professional for advice if you get a fever, chills or sore throat, or other symptoms of a cold or flu. Do not treat yourself. This drug decreases your body's ability to fight infections. Try to avoid being around people who are sick. This medicine may increase your risk to bruise or bleed. Call your doctor or health care professional if you notice any unusual bleeding. Be careful brushing and flossing your teeth or using a toothpick because you may get an infection or bleed more easily. If you have any dental work done, tell your dentist you are receiving this medicine. Avoid taking products that contain aspirin, acetaminophen, ibuprofen, naproxen, or ketoprofen unless instructed by your doctor. These medicines may hide a fever. Do not become pregnant while taking this medicine. Women should inform their doctor if they wish to become pregnant or think they might be pregnant. There is a potential for serious side effects to an unborn child. Talk to your health care professional or pharmacist for more information. Do not breast-feed an infant while taking this medicine. What side effects may I notice from receiving this medicine? Side effects that you should report to your doctor or health care professional as soon as possible: -allergic reactions like skin rash, itching or hives, swelling of the face, lips, or tongue -low blood counts - this medicine may decrease the number of white blood cells, red blood cells and platelets. You may be at increased risk for infections and bleeding. -signs of infection  - fever or chills, cough, sore throat, pain or difficulty passing urine -signs of decreased platelets or bleeding - bruising, pinpoint red spots on the skin, black, tarry stools, blood in the urine -signs of decreased red blood cells - unusually weak or tired, fainting spells, lightheadedness -breathing problems -chest pain -diarrhea -feeling faint or lightheaded, falls -flushing, runny nose, sweating during infusion -mouth sores or pain -pain, swelling, redness or irritation where injected -pain, swelling, warmth in the leg -pain, tingling, numbness in the hands or feet -problems with balance, talking, walking -stomach cramps, pain -trouble passing urine or change in the amount of urine -vomiting as to be unable to hold down drinks or food -yellowing of the eyes or skin Side effects that usually do not require medical attention (report to your doctor or health care professional if they continue or are bothersome): -constipation -hair loss -headache -loss of appetite -nausea, vomiting -stomach upset This list may not describe all possible side effects. Call your doctor for medical advice about side effects. You may report side effects to FDA at 1-800-FDA-1088. Where should I keep my medicine? This drug is given in a hospital or clinic and will not be stored at home. NOTE: This sheet is a summary. It may not cover all possible information. If you have questions about this medicine, talk to your doctor, pharmacist, or health care provider.  2015, Elsevier/Gold Standard. (2012-11-01 16:29:32)    Cisplatin injection What is this medicine? CISPLATIN (SIS pla tin) is a  chemotherapy drug. It targets fast dividing cells, like cancer cells, and causes these cells to die. This medicine is used to treat many types of cancer like bladder, ovarian, and testicular cancers. This medicine may be used for other purposes; ask your health care provider or pharmacist if you have questions. COMMON  BRAND NAME(S): Platinol, Platinol -AQ What should I tell my health care provider before I take this medicine? They need to know if you have any of these conditions: -blood disorders -hearing problems -kidney disease -recent or ongoing radiation therapy -an unusual or allergic reaction to cisplatin, carboplatin, other chemotherapy, other medicines, foods, dyes, or preservatives -pregnant or trying to get pregnant -breast-feeding How should I use this medicine? This drug is given as an infusion into a vein. It is administered in a hospital or clinic by a specially trained health care professional. Talk to your pediatrician regarding the use of this medicine in children. Special care may be needed. Overdosage: If you think you have taken too much of this medicine contact a poison control center or emergency room at once. NOTE: This medicine is only for you. Do not share this medicine with others. What if I miss a dose? It is important not to miss a dose. Call your doctor or health care professional if you are unable to keep an appointment. What may interact with this medicine? -dofetilide -foscarnet -medicines for seizures -medicines to increase blood counts like filgrastim, pegfilgrastim, sargramostim -probenecid -pyridoxine used with altretamine -rituximab -some antibiotics like amikacin, gentamicin, neomycin, polymyxin B, streptomycin, tobramycin -sulfinpyrazone -vaccines -zalcitabine Talk to your doctor or health care professional before taking any of these medicines: -acetaminophen -aspirin -ibuprofen -ketoprofen -naproxen This list may not describe all possible interactions. Give your health care provider a list of all the medicines, herbs, non-prescription drugs, or dietary supplements you use. Also tell them if you smoke, drink alcohol, or use illegal drugs. Some items may interact with your medicine. What should I watch for while using this medicine? Your condition will be  monitored carefully while you are receiving this medicine. You will need important blood work done while you are taking this medicine. This drug may make you feel generally unwell. This is not uncommon, as chemotherapy can affect healthy cells as well as cancer cells. Report any side effects. Continue your course of treatment even though you feel ill unless your doctor tells you to stop. In some cases, you may be given additional medicines to help with side effects. Follow all directions for their use. Call your doctor or health care professional for advice if you get a fever, chills or sore throat, or other symptoms of a cold or flu. Do not treat yourself. This drug decreases your body's ability to fight infections. Try to avoid being around people who are sick. This medicine may increase your risk to bruise or bleed. Call your doctor or health care professional if you notice any unusual bleeding. Be careful brushing and flossing your teeth or using a toothpick because you may get an infection or bleed more easily. If you have any dental work done, tell your dentist you are receiving this medicine. Avoid taking products that contain aspirin, acetaminophen, ibuprofen, naproxen, or ketoprofen unless instructed by your doctor. These medicines may hide a fever. Do not become pregnant while taking this medicine. Women should inform their doctor if they wish to become pregnant or think they might be pregnant. There is a potential for serious side effects to an unborn child. Talk to  your health care professional or pharmacist for more information. Do not breast-feed an infant while taking this medicine. Drink fluids as directed while you are taking this medicine. This will help protect your kidneys. Call your doctor or health care professional if you get diarrhea. Do not treat yourself. What side effects may I notice from receiving this medicine? Side effects that you should report to your doctor or health care  professional as soon as possible: -allergic reactions like skin rash, itching or hives, swelling of the face, lips, or tongue -signs of infection - fever or chills, cough, sore throat, pain or difficulty passing urine -signs of decreased platelets or bleeding - bruising, pinpoint red spots on the skin, black, tarry stools, nosebleeds -signs of decreased red blood cells - unusually weak or tired, fainting spells, lightheadedness -breathing problems -changes in hearing -gout pain -low blood counts - This drug may decrease the number of white blood cells, red blood cells and platelets. You may be at increased risk for infections and bleeding. -nausea and vomiting -pain, swelling, redness or irritation at the injection site -pain, tingling, numbness in the hands or feet -problems with balance, movement -trouble passing urine or change in the amount of urine Side effects that usually do not require medical attention (report to your doctor or health care professional if they continue or are bothersome): -changes in vision -loss of appetite -metallic taste in the mouth or changes in taste This list may not describe all possible side effects. Call your doctor for medical advice about side effects. You may report side effects to FDA at 1-800-FDA-1088. Where should I keep my medicine? This drug is given in a hospital or clinic and will not be stored at home. NOTE: This sheet is a summary. It may not cover all possible information. If you have questions about this medicine, talk to your doctor, pharmacist, or health care provider.  2015, Elsevier/Gold Standard. (2007-08-10 14:40:54)   Denosumab injection What is this medicine? DENOSUMAB (den oh sue mab) slows bone breakdown. Prolia is used to treat osteoporosis in women after menopause and in men. Delton See is used to prevent bone fractures and other bone problems caused by cancer bone metastases. Delton See is also used to treat giant cell tumor of the  bone. This medicine may be used for other purposes; ask your health care provider or pharmacist if you have questions. COMMON BRAND NAME(S): Prolia, XGEVA What should I tell my health care provider before I take this medicine? They need to know if you have any of these conditions: -dental disease -eczema -infection or history of infections -kidney disease or on dialysis -low blood calcium or vitamin D -malabsorption syndrome -scheduled to have surgery or tooth extraction -taking medicine that contains denosumab -thyroid or parathyroid disease -an unusual reaction to denosumab, other medicines, foods, dyes, or preservatives -pregnant or trying to get pregnant -breast-feeding How should I use this medicine? This medicine is for injection under the skin. It is given by a health care professional in a hospital or clinic setting. If you are getting Prolia, a special MedGuide will be given to you by the pharmacist with each prescription and refill. Be sure to read this information carefully each time. For Prolia, talk to your pediatrician regarding the use of this medicine in children. Special care may be needed. For Delton See, talk to your pediatrician regarding the use of this medicine in children. While this drug may be prescribed for children as young as 13 years for selected conditions, precautions  do apply. Overdosage: If you think you've taken too much of this medicine contact a poison control center or emergency room at once. Overdosage: If you think you have taken too much of this medicine contact a poison control center or emergency room at once. NOTE: This medicine is only for you. Do not share this medicine with others. What if I miss a dose? It is important not to miss your dose. Call your doctor or health care professional if you are unable to keep an appointment. What may interact with this medicine? Do not take this medicine with any of the following medications: -other medicines  containing denosumab This medicine may also interact with the following medications: -medicines that suppress the immune system -medicines that treat cancer -steroid medicines like prednisone or cortisone This list may not describe all possible interactions. Give your health care provider a list of all the medicines, herbs, non-prescription drugs, or dietary supplements you use. Also tell them if you smoke, drink alcohol, or use illegal drugs. Some items may interact with your medicine. What should I watch for while using this medicine? Visit your doctor or health care professional for regular checks on your progress. Your doctor or health care professional may order blood tests and other tests to see how you are doing. Call your doctor or health care professional if you get a cold or other infection while receiving this medicine. Do not treat yourself. This medicine may decrease your body's ability to fight infection. You should make sure you get enough calcium and vitamin D while you are taking this medicine, unless your doctor tells you not to. Discuss the foods you eat and the vitamins you take with your health care professional. See your dentist regularly. Brush and floss your teeth as directed. Before you have any dental work done, tell your dentist you are receiving this medicine. Do not become pregnant while taking this medicine or for 5 months after stopping it. Women should inform their doctor if they wish to become pregnant or think they might be pregnant. There is a potential for serious side effects to an unborn child. Talk to your health care professional or pharmacist for more information. What side effects may I notice from receiving this medicine? Side effects that you should report to your doctor or health care professional as soon as possible: -allergic reactions like skin rash, itching or hives, swelling of the face, lips, or tongue -breathing problems -chest pain -fast,  irregular heartbeat -feeling faint or lightheaded, falls -fever, chills, or any other sign of infection -muscle spasms, tightening, or twitches -numbness or tingling -skin blisters or bumps, or is dry, peels, or red -slow healing or unexplained pain in the mouth or jaw -unusual bleeding or bruising Side effects that usually do not require medical attention (Report these to your doctor or health care professional if they continue or are bothersome.): -muscle pain -stomach upset, gas This list may not describe all possible side effects. Call your doctor for medical advice about side effects. You may report side effects to FDA at 1-800-FDA-1088. Where should I keep my medicine? This medicine is only given in a clinic, doctor's office, or other health care setting and will not be stored at home. NOTE: This sheet is a summary. It may not cover all possible information. If you have questions about this medicine, talk to your doctor, pharmacist, or health care provider.  2015, [employer]/Gold Standard. (2011-11-03 12:37:47)

## 2014-10-25 NOTE — Patient Instructions (Signed)

## 2014-10-30 ENCOUNTER — Telehealth: Payer: Self-pay | Admitting: Medical Oncology

## 2014-10-30 NOTE — Telephone Encounter (Signed)
Not feeling so great- not feeling like eating. No more diarrhea.since this am . She has Imodium as if needed.She is taking pain med for abd pain. She confirmed appt on wed. I told her to call back if she does not start feeling better.

## 2014-10-30 NOTE — Telephone Encounter (Signed)
'  Loose stool"  started today. Pain in abdomen started Friday  . Denies vomiting, fever.  -ate egg sandwich today. I told husband to call back this at 2 pm to let me know how pt is feeling.

## 2014-11-01 ENCOUNTER — Ambulatory Visit: Payer: Medicare Other

## 2014-11-01 ENCOUNTER — Other Ambulatory Visit (HOSPITAL_BASED_OUTPATIENT_CLINIC_OR_DEPARTMENT_OTHER): Payer: Medicare Other

## 2014-11-01 ENCOUNTER — Ambulatory Visit (HOSPITAL_BASED_OUTPATIENT_CLINIC_OR_DEPARTMENT_OTHER): Payer: Medicare Other | Admitting: Oncology

## 2014-11-01 ENCOUNTER — Other Ambulatory Visit: Payer: Self-pay | Admitting: *Deleted

## 2014-11-01 ENCOUNTER — Encounter: Payer: Self-pay | Admitting: Oncology

## 2014-11-01 ENCOUNTER — Ambulatory Visit (HOSPITAL_BASED_OUTPATIENT_CLINIC_OR_DEPARTMENT_OTHER): Payer: Medicare Other

## 2014-11-01 ENCOUNTER — Other Ambulatory Visit: Payer: Medicare Other

## 2014-11-01 ENCOUNTER — Ambulatory Visit: Payer: Medicare Other | Admitting: Neurology

## 2014-11-01 ENCOUNTER — Telehealth: Payer: Self-pay | Admitting: Oncology

## 2014-11-01 ENCOUNTER — Telehealth: Payer: Self-pay | Admitting: *Deleted

## 2014-11-01 VITALS — BP 118/50 | HR 91 | Temp 98.1°F | Resp 18 | Ht 65.0 in | Wt 125.3 lb

## 2014-11-01 DIAGNOSIS — C3491 Malignant neoplasm of unspecified part of right bronchus or lung: Secondary | ICD-10-CM

## 2014-11-01 DIAGNOSIS — M542 Cervicalgia: Secondary | ICD-10-CM

## 2014-11-01 DIAGNOSIS — Z5111 Encounter for antineoplastic chemotherapy: Secondary | ICD-10-CM | POA: Diagnosis not present

## 2014-11-01 DIAGNOSIS — C7951 Secondary malignant neoplasm of bone: Secondary | ICD-10-CM

## 2014-11-01 DIAGNOSIS — Z95828 Presence of other vascular implants and grafts: Secondary | ICD-10-CM

## 2014-11-01 DIAGNOSIS — C3490 Malignant neoplasm of unspecified part of unspecified bronchus or lung: Secondary | ICD-10-CM

## 2014-11-01 DIAGNOSIS — R110 Nausea: Secondary | ICD-10-CM

## 2014-11-01 DIAGNOSIS — D6481 Anemia due to antineoplastic chemotherapy: Secondary | ICD-10-CM | POA: Diagnosis not present

## 2014-11-01 DIAGNOSIS — R251 Tremor, unspecified: Secondary | ICD-10-CM

## 2014-11-01 LAB — CBC WITH DIFFERENTIAL/PLATELET
BASO%: 0 % (ref 0.0–2.0)
Basophils Absolute: 0 10*3/uL (ref 0.0–0.1)
EOS%: 3.3 % (ref 0.0–7.0)
Eosinophils Absolute: 0.1 10*3/uL (ref 0.0–0.5)
HCT: 27.2 % — ABNORMAL LOW (ref 34.8–46.6)
HGB: 9.3 g/dL — ABNORMAL LOW (ref 11.6–15.9)
LYMPH%: 19.2 % (ref 14.0–49.7)
MCH: 30.7 pg (ref 25.1–34.0)
MCHC: 34.2 g/dL (ref 31.5–36.0)
MCV: 89.8 fL (ref 79.5–101.0)
MONO#: 0.4 10*3/uL (ref 0.1–0.9)
MONO%: 9.1 % (ref 0.0–14.0)
NEUT#: 2.9 10*3/uL (ref 1.5–6.5)
NEUT%: 68.4 % (ref 38.4–76.8)
NRBC: 0 % (ref 0–0)
PLATELETS: 109 10*3/uL — AB (ref 145–400)
RBC: 3.03 10*6/uL — AB (ref 3.70–5.45)
RDW: 13.2 % (ref 11.2–14.5)
WBC: 4.3 10*3/uL (ref 3.9–10.3)
lymph#: 0.8 10*3/uL — ABNORMAL LOW (ref 0.9–3.3)

## 2014-11-01 LAB — COMPREHENSIVE METABOLIC PANEL (CC13)
ALBUMIN: 3.3 g/dL — AB (ref 3.5–5.0)
ALT: <6 U/L (ref 0–55)
AST: 9 U/L (ref 5–34)
Alkaline Phosphatase: 72 U/L (ref 40–150)
Anion Gap: 11 mEq/L (ref 3–11)
BUN: 15.2 mg/dL (ref 7.0–26.0)
CHLORIDE: 97 meq/L — AB (ref 98–109)
CO2: 25 mEq/L (ref 22–29)
Calcium: 7.6 mg/dL — ABNORMAL LOW (ref 8.4–10.4)
Creatinine: 0.9 mg/dL (ref 0.6–1.1)
EGFR: 64 mL/min/{1.73_m2} — AB (ref >90)
GLUCOSE: 120 mg/dL (ref 70–140)
Potassium: 3.5 mEq/L (ref 3.5–5.1)
Sodium: 134 mEq/L — ABNORMAL LOW (ref 136–145)
Total Bilirubin: 0.76 mg/dL (ref 0.20–1.20)
Total Protein: 6.6 g/dL (ref 6.4–8.3)

## 2014-11-01 MED ORDER — SODIUM CHLORIDE 0.9 % IJ SOLN
10.0000 mL | INTRAMUSCULAR | Status: DC | PRN
  Administered 2014-11-01: 10 mL
  Filled 2014-11-01: qty 10

## 2014-11-01 MED ORDER — PROCHLORPERAZINE MALEATE 10 MG PO TABS: 10.0000 mg | ORAL_TABLET | Freq: Four times a day (QID) | ORAL | Status: DC | PRN

## 2014-11-01 MED ORDER — ATROPINE SULFATE 1 MG/ML IJ SOLN
0.5000 mg | Freq: Once | INTRAMUSCULAR | Status: AC | PRN
  Administered 2014-11-01: 0.5 mg via INTRAVENOUS

## 2014-11-01 MED ORDER — IRINOTECAN HCL CHEMO INJECTION 100 MG/5ML
61.0000 mg/m2 | Freq: Once | INTRAVENOUS | Status: AC
  Administered 2014-11-01: 100 mg via INTRAVENOUS
  Filled 2014-11-01: qty 5

## 2014-11-01 MED ORDER — SODIUM CHLORIDE 0.9 % IV SOLN
25.0000 mg/m2 | Freq: Once | INTRAVENOUS | Status: AC
  Administered 2014-11-01: 41 mg via INTRAVENOUS
  Filled 2014-11-01: qty 41

## 2014-11-01 MED ORDER — POTASSIUM CHLORIDE 2 MEQ/ML IV SOLN
Freq: Once | INTRAVENOUS | Status: AC
  Administered 2014-11-01: 11:00:00 via INTRAVENOUS
  Filled 2014-11-01: qty 10

## 2014-11-01 MED ORDER — PALONOSETRON HCL INJECTION 0.25 MG/5ML
INTRAVENOUS | Status: AC
  Filled 2014-11-01: qty 5

## 2014-11-01 MED ORDER — OXYCODONE-ACETAMINOPHEN 5-325 MG PO TABS: 1.0000 | ORAL_TABLET | Freq: Four times a day (QID) | ORAL | Status: AC | PRN

## 2014-11-01 MED ORDER — FOSAPREPITANT DIMEGLUMINE INJECTION 150 MG
Freq: Once | INTRAVENOUS | Status: AC
  Administered 2014-11-01: 13:00:00 via INTRAVENOUS
  Filled 2014-11-01: qty 5

## 2014-11-01 MED ORDER — ONDANSETRON HCL 8 MG PO TABS: 8.0000 mg | ORAL_TABLET | Freq: Three times a day (TID) | ORAL | Status: AC | PRN

## 2014-11-01 MED ORDER — HEPARIN SOD (PORK) LOCK FLUSH 100 UNIT/ML IV SOLN
500.0000 [IU] | Freq: Once | INTRAVENOUS | Status: AC | PRN
  Administered 2014-11-01: 500 [IU]
  Filled 2014-11-01: qty 5

## 2014-11-01 MED ORDER — SODIUM CHLORIDE 0.9 % IV SOLN
Freq: Once | INTRAVENOUS | Status: AC
  Administered 2014-11-01: 10:00:00 via INTRAVENOUS

## 2014-11-01 MED ORDER — ATROPINE SULFATE 1 MG/ML IJ SOLN
INTRAMUSCULAR | Status: AC
  Filled 2014-11-01: qty 1

## 2014-11-01 MED ORDER — PALONOSETRON HCL INJECTION 0.25 MG/5ML
0.2500 mg | Freq: Once | INTRAVENOUS | Status: AC
  Administered 2014-11-01: 0.25 mg via INTRAVENOUS

## 2014-11-01 MED ORDER — SODIUM CHLORIDE 0.9 % IJ SOLN
10.0000 mL | INTRAMUSCULAR | Status: DC | PRN
  Administered 2014-11-01: 10 mL via INTRAVENOUS
  Filled 2014-11-01: qty 10

## 2014-11-01 NOTE — Telephone Encounter (Signed)
Rad onc referral noted and  S[facility] will call her with an appointment

## 2014-11-01 NOTE — Telephone Encounter (Signed)
I have adjusted 6/29 appt

## 2014-11-01 NOTE — Patient Instructions (Signed)

## 2014-11-01 NOTE — Telephone Encounter (Signed)
Appointments added per pof,email to michelle to help adjust 6/29 chemo and patient will get a new avs in chemo

## 2014-11-01 NOTE — Progress Notes (Signed)
[facility] [phone]   [phone]  OFFICE PROGRESS NOTE  [name], MD [address] [place] [address]  DIAGNOSIS: Extensive stage, stage IV (T3, N2, M1b) small cell lung cancer diagnosed in October 2015.  PRIOR THERAPY: Systemic chemotherapy with carboplatin for AUC of 5 on day 1 and etoposide 100 MG/M2 on days 1, 2 and 3 with Neulasta support on day 4. Status post 6 cycles. Starting from cycle #5 carboplatin with for AUC of 4 and etoposide 80 MG/M2  CURRENT THERAPY: The patient restarted systemic chemotherapy with cisplatin 25 mg meter squared and irinotecan 65 mg meter squared given day 1 and day 8 of each cycle. She started this on 10/25/2014.  INTERVAL HISTORY: AKITA MAXIM 74 y.o. female returns to the clinic today for follow-up visit accompanied by her husband. The patient started chemotherapy last week and is here for cycle 1 day 8 today. After her first dose of chemotherapy she reported flulike symptoms including fatigue and arthralgias. Her symptoms are better today however. She also reports an intermittent productive cough. No hemoptysis. She had some intermittent nausea without vomiting over the past week but none today. Uses Compazine as needed. She had diarrhea for about 2-3 days and used Imodium which was effective for her.  she continues to have back pain and uses oxycodone. She would like a refill of this today. She denied having fever or chills. The patient denied having any significant chest pain but continues to have shortness breath with exertion.   MEDICAL HISTORY: Past Medical History  Diagnosis Date  . GERD (gastroesophageal reflux disease)   . IBS (irritable bowel syndrome)   . Adenomatous colon polyp 06/2009  . Hiatal hernia   . Breast cyst   . Diverticulosis   . Hemorrhoids   . Hyperlipidemia   . Hypertension   . Hyperparathyroidism   . Anxiety   . Migraine   . Osteoporosis   . Vitamin D deficiency   .  Spinal stenosis   . C. difficile colitis 2006  . Small cell lung carcinoma dx'd 02/2014  . Bone metastases dx'd 02/2014    ALLERGIES:  is allergic to ace inhibitors; cephalexin; etodolac; and gabapentin.  MEDICATIONS:  Current Outpatient Prescriptions  Medication Sig Dispense Refill  . ALPRAZolam (XANAX) 0.5 MG tablet Take 0.5 mg by mouth at bedtime.      Marland Kitchen atorvastatin (LIPITOR) 80 MG tablet Take 80 mg by mouth every morning.     Marland Kitchen buPROPion (WELLBUTRIN SR) 150 MG 12 hr tablet Take 150 mg by mouth every morning.     Marland Kitchen CALCIUM PO Take 1 tablet by mouth 3 (three) times daily. Starting 06/21/14--taking '600mg'$  three times daily    . carbidopa-levodopa (SINEMET IR) 25-100 MG per tablet Take 1 tablet by mouth 3 (three) times daily. 270 tablet 1  . DEXILANT 60 MG capsule TAKE ONE CAPSULE BY MOUTH DAILY 30 capsule 0  . Diclofenac (ZORVOLEX) 35 MG CAPS Take 35 mg by mouth every morning.     . feeding supplement, ENSURE COMPLETE, (ENSURE COMPLETE) LIQD Take 237 mLs by mouth 2 (two) times daily between meals. 237 mL 0  . glycopyrrolate (ROBINUL) 2 MG tablet TAKE 1 TABLET (2 MG TOTAL) BY MOUTH 2 (TWO) TIMES DAILY. 60 tablet 0  . hydrochlorothiazide (HYDRODIURIL) 12.5 MG tablet Take 1 tablet by mouth every morning.     Marland Kitchen KLOR-CON M20 20 MEQ tablet TAKE 1 TABLET BY MOUTH EVERY DAY 30 tablet  1  . lidocaine-prilocaine (EMLA) cream Apply 1 application topically as needed. Apply to port 1 hr before chemo 30 g 0  . LORazepam (ATIVAN) 0.5 MG tablet Take 1 tablet (0.5 mg total) by mouth every 8 (eight) hours. 30 tablet 0  . metoprolol (TOPROL-XL) 50 MG 24 hr tablet Take 50 mg by mouth every morning.     Marland Kitchen oxyCODONE-acetaminophen (PERCOCET/ROXICET) 5-325 MG per tablet Take 1 tablet by mouth every 6 (six) hours as needed for severe pain. 30 tablet 0  . Polyethylene Glycol 3350 (MIRALAX PO) Take 1 scoop by mouth daily as needed (for constipation).     . ondansetron (ZOFRAN) 8 MG tablet Take 1 tablet (8 mg total) by  mouth every 8 (eight) hours as needed for nausea or vomiting. 20 tablet 0  . prochlorperazine (COMPAZINE) 10 MG tablet Take 1 tablet (10 mg total) by mouth every 6 (six) hours as needed for nausea or vomiting. 30 tablet 1   No current facility-administered medications for this visit.   Facility-Administered Medications Ordered in Other Visits  Medication Dose Route Frequency Provider Last Rate Last Dose  . heparin lock flush 100 unit/mL  500 Units Intracatheter Once PRN Curt Bears, MD      . sodium chloride 0.9 % injection 10 mL  10 mL Intracatheter PRN Curt Bears, MD        SURGICAL HISTORY:  Past Surgical History  Procedure Laterality Date  . Cholecystectomy    . Vaginal hysterectomy    . Knee arthroscopy      left  . Parathyroidectomy  2009    [facility]  . Lumbar spine surgery  2001  . Breast biopsy      bilateral  . Total knee arthroplasty      right  . Bladder repair    . Video bronchoscopy Bilateral 03/14/2014    Procedure: VIDEO BRONCHOSCOPY WITHOUT FLUORO;  Surgeon: Tanda Rockers, MD;  Location: [facility];  Service: Cardiopulmonary;  Laterality: Bilateral;    REVIEW OF SYSTEMS:  Constitutional: positive for fatigue Eyes: negative Ears, nose, mouth, throat, and face: negative Respiratory: positive for dyspnea on exertion Cardiovascular: negative Gastrointestinal: negative Genitourinary:negative Integument/breast: negative Hematologic/lymphatic: negative Musculoskeletal:positive for back pain Neurological: positive for tremors Behavioral/Psych: negative Endocrine: negative Allergic/Immunologic: negative   PHYSICAL EXAMINATION: General appearance: alert, cooperative, fatigued and no distress Head: Normocephalic, without obvious abnormality, atraumatic Neck: no adenopathy, no JVD, supple, symmetrical, trachea midline and thyroid not enlarged, symmetric, no tenderness/mass/nodules Lymph nodes: Cervical, supraclavicular, and axillary nodes  normal. Resp: clear to auscultation bilaterally Back: symmetric, no curvature. ROM normal. No CVA tenderness. Cardio: regular rate and rhythm, S1, S2 normal, no murmur, click, rub or gallop GI: soft, non-tender; bowel sounds normal; no masses,  no organomegaly Extremities: extremities normal, atraumatic, no cyanosis or edema Neurologic: Alert and oriented X 3, normal strength and tone. Normal symmetric reflexes. Normal coordination and gait  ECOG PERFORMANCE STATUS: 2 - Symptomatic, <50% confined to bed  Blood pressure 118/50, pulse 91, temperature 98.1 F (36.7 C), temperature source Oral, resp. rate 18, height '5\' 5"'$  (1.651 m), weight 125 lb 4.8 oz (56.836 kg), SpO2 100 %.  LABORATORY DATA: Lab Results  Component Value Date   WBC 4.3 11/01/2014   HGB 9.3* 11/01/2014   HCT 27.2* 11/01/2014   MCV 89.8 11/01/2014   PLT 109* 11/01/2014      Chemistry      Component Value Date/Time   NA 134* 11/01/2014 0842   NA 136 05/17/2014 0455  K 3.5 11/01/2014 0842   K 3.4* 05/17/2014 0455   CL 95* 05/17/2014 0455   CO2 25 11/01/2014 0842   CO2 32 05/17/2014 0455   BUN 15.2 11/01/2014 0842   BUN 6 05/17/2014 0455   CREATININE 0.9 11/01/2014 0842   CREATININE 0.78 05/17/2014 0455      Component Value Date/Time   CALCIUM 7.6* 11/01/2014 0842   CALCIUM 7.7* 05/17/2014 0455   ALKPHOS 72 11/01/2014 0842   ALKPHOS 232* 05/13/2014 1605   AST 9 11/01/2014 0842   AST 15 05/13/2014 1605   ALT <6 11/01/2014 0842   ALT 10 05/13/2014 1605   BILITOT 0.76 11/01/2014 0842   BILITOT 1.1 05/13/2014 1605       RADIOGRAPHIC STUDIES: Ct Head W Wo Contrast  10/13/2014   CLINICAL DATA:  Squamous cell carcinoma lung with bone Mets. Staging.  EXAM: CT HEAD WITHOUT AND WITH CONTRAST  TECHNIQUE: Contiguous axial images were obtained from the base of the skull through the vertex without and with intravenous contrast  CONTRAST:  144m OMNIPAQUE IOHEXOL 300 MG/ML  SOLN  COMPARISON:  MR brain 03/31/14   FINDINGS: No evidence for acute infarction, hemorrhage, mass lesion, hydrocephalus, or extra-axial fluid. Mild atrophy. Mild white matter disease. Post infusion, no abnormal enhancement.  Osseous disease is present. There are small foci of sclerosis in the clivus, probably representing metastatic disease (treated?). Within the calvarium, abnormal lucencies predominate. No intracranial epidural tumor however. This appearance is stable from prior MR.  Negative orbits.  Negative sinuses.  IMPRESSION: No evidence metastatic disease to the brain or dura.  Osseous metastatic disease to the skull and skull base as described. No features concerning for epidural tumor however.   Electronically Signed   By: JRolla FlattenM.D.   On: 10/13/2014 14:35   Ct Chest W Contrast  10/13/2014   CLINICAL DATA:  Patient with metastatic small cell lung cancer.  EXAM: CT CHEST, ABDOMEN, AND PELVIS WITH CONTRAST  TECHNIQUE: Multidetector CT imaging of the chest, abdomen and pelvis was performed following the standard protocol during bolus administration of intravenous contrast.  CONTRAST:  1034mOMNIPAQUE IOHEXOL 300 MG/ML  SOLN  COMPARISON:  CT CAP 08/09/2014  FINDINGS: CT CHEST FINDINGS  Mediastinum/Nodes: Right anterior chest wall Port-A-Cath is present with tip terminating at the superior cavoatrial junction. No enlarged axillary lymphadenopathy. The heart is normal in size. Trace fluid within the superior pericardial recess. Interval enlargement of now 15 mm pretracheal lymph node (image 22; series 5), previously 8 mm. Multiple new right paratracheal and pretracheal lymph nodes are demonstrated including a 6 mm right peritracheal lymph node (image 11; series 5). Oral contrast is demonstrated within the esophagus. Interval development of a 11 x 18 mm heterogeneous enhancing soft tissue mass anterior to the T11 vertebral body (image 42; series 5). Additionally there is a new 31 x 12 mm soft tissue mass adjacent to the right aspect of  the T11 vertebral body. New 16 mm lymph node adjacent to the right bronchus (image 29; series 5).  Lungs/Pleura: Central airways are patent. Interval development of a 20 x 6 mm subpleural nodule within the right lower lobe (image 34; series 7) and a 12 x 11 mm nodule within the right lower lobe (image 31; series 7). Multiple new predominantly subpleural nodules are demonstrated within the right hemi thorax most pronounced along the diaphragm and anterior aspect of the right middle and right upper lobes. 7 mm irregular nodule within the right upper lobe (image 20;  series 7). No pleural effusion or pneumothorax.  Musculoskeletal: Extensive sclerotic lesions are demonstrated throughout the visualized osseous skeleton. Many of these lesions are slightly increased in sclerosis. Evaluation for new lesions is difficult given the extensive number of lesions however no definite new large lesions are identified. Postsurgical changes L4-S1.  CT ABDOMEN AND PELVIS FINDINGS  Hepatobiliary: Liver is normal in size and contour. Re- demonstrated mild intrahepatic biliary ductal dilatation. Patient status post cholecystectomy. No extrahepatic biliary ductal dilatation.  Pancreas: Unremarkable  Spleen: Unremarkable  Adrenals/Urinary Tract: Stable thickening of the bilateral adrenal glands. Kidneys are symmetric in size and enhance appropriately. Bilateral renal cysts are stable.  Stomach/Bowel: Sigmoid colonic diverticulosis. No CT evidence for acute diverticulitis. No evidence for bowel obstruction. No free fluid or free intraperitoneal air.  Vascular/Lymphatic: Stable infrarenal abdominal aortic ectasia measuring 2.6 cm. The abdominal aorta is peripherally calcified. No retroperitoneal lymphadenopathy.  Other: None  Musculoskeletal: Extensive sclerotic lesions are demonstrated throughout the visualized osseous skeleton. Many of these lesions are slightly increased in sclerosis. Evaluation for new lesions is difficult given the  extensive number of lesions however no definite new large lesions are identified. Postsurgical changes L4-S1.  IMPRESSION: Interval progression of metastatic disease with interval development of bulky mediastinal adenopathy, multiple new pulmonary nodules and new paraspinal soft tissue masses.  Extensive sclerotic lesions are demonstrated throughout the visualized osseous skeleton. Many of these lesions are slightly increased in sclerosis. Evaluation for new lesions is difficult given the extensive number of lesions.   Electronically Signed   By: Lovey Newcomer M.D.   On: 10/13/2014 14:51   Ct Abdomen Pelvis W Contrast  10/13/2014   CLINICAL DATA:  Patient with metastatic small cell lung cancer.  EXAM: CT CHEST, ABDOMEN, AND PELVIS WITH CONTRAST  TECHNIQUE: Multidetector CT imaging of the chest, abdomen and pelvis was performed following the standard protocol during bolus administration of intravenous contrast.  CONTRAST:  155m OMNIPAQUE IOHEXOL 300 MG/ML  SOLN  COMPARISON:  CT CAP 08/09/2014  FINDINGS: CT CHEST FINDINGS  Mediastinum/Nodes: Right anterior chest wall Port-A-Cath is present with tip terminating at the superior cavoatrial junction. No enlarged axillary lymphadenopathy. The heart is normal in size. Trace fluid within the superior pericardial recess. Interval enlargement of now 15 mm pretracheal lymph node (image 22; series 5), previously 8 mm. Multiple new right paratracheal and pretracheal lymph nodes are demonstrated including a 6 mm right peritracheal lymph node (image 11; series 5). Oral contrast is demonstrated within the esophagus. Interval development of a 11 x 18 mm heterogeneous enhancing soft tissue mass anterior to the T11 vertebral body (image 42; series 5). Additionally there is a new 31 x 12 mm soft tissue mass adjacent to the right aspect of the T11 vertebral body. New 16 mm lymph node adjacent to the right bronchus (image 29; series 5).  Lungs/Pleura: Central airways are patent.  Interval development of a 20 x 6 mm subpleural nodule within the right lower lobe (image 34; series 7) and a 12 x 11 mm nodule within the right lower lobe (image 31; series 7). Multiple new predominantly subpleural nodules are demonstrated within the right hemi thorax most pronounced along the diaphragm and anterior aspect of the right middle and right upper lobes. 7 mm irregular nodule within the right upper lobe (image 20; series 7). No pleural effusion or pneumothorax.  Musculoskeletal: Extensive sclerotic lesions are demonstrated throughout the visualized osseous skeleton. Many of these lesions are slightly increased in sclerosis. Evaluation for new lesions is difficult  given the extensive number of lesions however no definite new large lesions are identified. Postsurgical changes L4-S1.  CT ABDOMEN AND PELVIS FINDINGS  Hepatobiliary: Liver is normal in size and contour. Re- demonstrated mild intrahepatic biliary ductal dilatation. Patient status post cholecystectomy. No extrahepatic biliary ductal dilatation.  Pancreas: Unremarkable  Spleen: Unremarkable  Adrenals/Urinary Tract: Stable thickening of the bilateral adrenal glands. Kidneys are symmetric in size and enhance appropriately. Bilateral renal cysts are stable.  Stomach/Bowel: Sigmoid colonic diverticulosis. No CT evidence for acute diverticulitis. No evidence for bowel obstruction. No free fluid or free intraperitoneal air.  Vascular/Lymphatic: Stable infrarenal abdominal aortic ectasia measuring 2.6 cm. The abdominal aorta is peripherally calcified. No retroperitoneal lymphadenopathy.  Other: None  Musculoskeletal: Extensive sclerotic lesions are demonstrated throughout the visualized osseous skeleton. Many of these lesions are slightly increased in sclerosis. Evaluation for new lesions is difficult given the extensive number of lesions however no definite new large lesions are identified. Postsurgical changes L4-S1.  IMPRESSION: Interval progression  of metastatic disease with interval development of bulky mediastinal adenopathy, multiple new pulmonary nodules and new paraspinal soft tissue masses.  Extensive sclerotic lesions are demonstrated throughout the visualized osseous skeleton. Many of these lesions are slightly increased in sclerosis. Evaluation for new lesions is difficult given the extensive number of lesions.   Electronically Signed   By: Lovey Newcomer M.D.   On: 10/13/2014 14:51    ASSESSMENT AND PLAN: This is a very pleasant 74 year old white female with:  1) Extensive stage small cell lung cancer currently undergoing systemic chemotherapy with carboplatin and etoposide status post 6 cycles. She had good response to the first line treatment and she has been on observation for the last 2 months. The recent CT scan of the chest, abdomen and pelvis showed interval progression of the metastatic disease with bulky mediastinal lymph nodes as well as multiple new pulmonary nodules and new paraspinal soft tissue masses as well as extensive bone metastasis. The patient is now on cisplatin 25 MG/M2 and irinotecan 65 MG/M2 on days 1 and 8 every 3 weeks.  The patient was seen and examined with Dr. Julien Nordmann. Recommend that she proceed with cycle 1 day 8 of her chemotherapy today as scheduled. She was given a prescription for Zofran to have at home to help control her nausea. Use of Imodium is again been discussed with the patient and she may take up to 8 tablets per day.   2) metastatic bone disease: The patient will continue on Xgeva 120 mcg subcutaneously every 4 weeks. The patient has no dental issues and currently has dentures. She was also advised to take calcium and vitamin D supplements on daily basis.  3) chemotherapy-induced anemia, stable. We'll continue to monitor for now  4) For the tremors,  She is also followed by neurology. She will continue on Sinemet IR.  5) right neck and back pain: She is on Percocet 5/325 mg by mouth every 6  hours as needed. I have refilled this for her today. We have again offered her a referral to radiation oncology for consideration of palliative radiotherapy. She was like to pursue this at this time and a referral has been made.  The patient would come back for follow-up visit in 2 weeks for reevaluation prior to cycle 2 of her chemotherapy. She will continue to have weekly labs.  The patient was advised to call immediately if she has any concerning symptoms in the interval. The patient voices understanding of current disease status  and treatment options and is in agreement with the current care plan.  All questions were answered. The patient knows to call the clinic with any problems, questions or concerns. We can certainly see the patient much sooner if necessary.   Disclaimer: This note was dictated with voice recognition software. Similar sounding words can inadvertently be transcribed and may not be corrected upon review.    Mikey Bussing, DNP, AGPCNP-BC, AOCNP  ADDENDUM: Hematology/Oncology Attending: I had a face to face encounter with the patient. I recommended her care plan. This is a very pleasant 74 years old white female with extensive stage small cell lung cancer who is currently undergoing second line systemic chemotherapy with reduced dose cisplatin and irinotecan status post day 1 of the first cycle. She tolerated the first week of her treatment fairly well except for fatigue and few episodes of diarrhea that completely resolved. I recommended for the patient to proceed with day 8 of the first cycle as a scheduled. She was advised to take Imodium and to call if she has no control of her diarrhea. We may consider her for treatment with Lomotil as needed. The patient would come back for follow-up visit in 2 weeks for reevaluation and management of any adverse effect of her treatment. She was advised to call immediately if she has any concerning symptoms in the  interval.  Disclaimer: This note was dictated with voice recognition software. Similar sounding words can inadvertently be transcribed and may not be corrected upon review.  Eilleen Kempf., MD 11/01/2014

## 2014-11-01 NOTE — Patient Instructions (Signed)
[facility] Discharge Instructions for Patients Receiving Chemotherapy  Today you received the following chemotherapy agents Irinotecan/Cisplatin.  To help prevent nausea and vomiting after your treatment, we encourage you to take your nausea medication as directed.  If you develop nausea and vomiting that is not controlled by your nausea medication, call the clinic.   BELOW ARE SYMPTOMS THAT SHOULD BE REPORTED IMMEDIATELY:  *FEVER GREATER THAN 100.5 F  *CHILLS WITH OR WITHOUT FEVER  NAUSEA AND VOMITING THAT IS NOT CONTROLLED WITH YOUR NAUSEA MEDICATION  *UNUSUAL SHORTNESS OF BREATH  *UNUSUAL BRUISING OR BLEEDING  TENDERNESS IN MOUTH AND THROAT WITH OR WITHOUT PRESENCE OF ULCERS  *URINARY PROBLEMS  *BOWEL PROBLEMS  UNUSUAL RASH Items with * indicate a potential emergency and should be followed up as soon as possible.  Feel free to call the clinic you have any questions or concerns. The clinic phone number is [phone].  Please show the [facility] at check-in to the Emergency Department and triage nurse.

## 2014-11-03 ENCOUNTER — Other Ambulatory Visit: Payer: Self-pay | Admitting: Neurology

## 2014-11-03 NOTE — Progress Notes (Signed)
Thoracic Location of Tumor / Histology: Endobronchial region   Ms. [name] was seen in October 2015 by primary care physician, Dr. Abbott Pao, complaining of back pain and cough of 3 weeks duration. Chest x-ray was performed on 02/24/2014 and it showed right lower paratracheal and right hilar adenopathy suspicious for carcinoma of the lung. CT scan of the chest With contrast was performed on 03/02/2014 and it showed bulky mediastinal adenopathy measuring up to 2.5 x 2.6 CM and the lower right paratracheal station. There was also right hilar nodal mass contiguous with the subcarinal region and chronic 50 measuring 4.4 x 6.2 CM. Nodules are seen in the right lower lobe with the largest subpleural nodule in the right lower lobe measuring 1.1 x 2.5 cm.. Pleural nodularity is seen in the right hemi thorax, with a basilar predominance. Largest pleural or extrapleural nodule is seen in the posterior medial inferior right hemi thorax, measuring 1.6 x 2.5 cm. He was referred to Dr. Melvyn Novas and on 03/14/2014 he underwent bronchoscopy with washing and went biopsy as well as endobronchial biopsy of the right middle lobe. The final pathology (Accession: [id number]) showed small cell carcinoma.  Seen by Dr. Curt Bears 03/18/14  10/13/14  CT scan of the chest, abdomen and pelvis showed interval progression of the metastatic disease with bulky mediastinal lymph nodes as well as multiple new pulmonary nodules and new paraspinal soft tissue masses as well as extensive bone metastasis. The patient is now on cisplatin 25 MG/M2 and irinotecan 65 MG/M2 on days 1 and 8 every 3 weeks.  Biopsies of Endobronchial region   (if applicable) revealed:   03/15/15 Diagnosis Endobronchial biopsy SMALL CELL CARCINOMA.  Tobacco/Marijuana/Snuff/ETOH use: Former smoker x 25 years (0.25 ppd), quit in 05/2013, No alcohol, No drugs  Past/Anticipated interventions by cardiothoracic surgery, if any: Dr. Christinia Gully: Endobronchial  Biopsy  Past/Anticipated interventions by medical oncology, if any:  Dr. Curt Bears  PRIOR THERAPY: Systemic chemotherapy with carboplatin for AUC of 5 on day 1 and etoposide 100 MG/M2 on days 1, 2 and 3 with Neulasta support on day 4. Status post 6 cycles. Starting from cycle #5 carboplatin with for AUC of 4 and etoposide 80 MG/M2.    Restarted systemic chemotherapy with cisplatin 25 mg meter squared and irinotecan 65 mg meter squared given day 1 and day 8 of each cycle. She started this on 10/25/2014. Xgeva 120 mcg subcutaneously every 4 weeks- Bony Mets  Signs/Symptoms  Weight changes, if any: Gained 10 lbs since 07/11/14  Respiratory complaints, if any:   Hemoptysis, if any:        Pain issues, if any:    SAFETY ISSUES:  Prior radiation? No  Pacemaker/ICD? No   Possible current pregnancy? No  Is the patient on methotrexate? No  Current Complaints / other details:

## 2014-11-03 NOTE — Telephone Encounter (Signed)
Do you want to refill this medication? It is not on her current med list in [facility].

## 2014-11-07 ENCOUNTER — Ambulatory Visit: Payer: Medicare Other | Admitting: Neurology

## 2014-11-07 ENCOUNTER — Ambulatory Visit (HOSPITAL_BASED_OUTPATIENT_CLINIC_OR_DEPARTMENT_OTHER): Payer: Medicare Other

## 2014-11-07 ENCOUNTER — Other Ambulatory Visit: Payer: Self-pay

## 2014-11-07 ENCOUNTER — Ambulatory Visit: Payer: Medicare Other | Admitting: Nurse Practitioner

## 2014-11-07 VITALS — BP 113/64 | HR 90 | Temp 98.5°F | Resp 16 | Wt 123.1 lb

## 2014-11-07 DIAGNOSIS — C3490 Malignant neoplasm of unspecified part of unspecified bronchus or lung: Secondary | ICD-10-CM

## 2014-11-07 DIAGNOSIS — R197 Diarrhea, unspecified: Secondary | ICD-10-CM

## 2014-11-07 DIAGNOSIS — Z95828 Presence of other vascular implants and grafts: Secondary | ICD-10-CM

## 2014-11-07 DIAGNOSIS — R110 Nausea: Secondary | ICD-10-CM | POA: Diagnosis not present

## 2014-11-07 DIAGNOSIS — R251 Tremor, unspecified: Secondary | ICD-10-CM

## 2014-11-07 DIAGNOSIS — E860 Dehydration: Secondary | ICD-10-CM

## 2014-11-07 DIAGNOSIS — R531 Weakness: Secondary | ICD-10-CM

## 2014-11-07 DIAGNOSIS — C3491 Malignant neoplasm of unspecified part of right bronchus or lung: Secondary | ICD-10-CM

## 2014-11-07 DIAGNOSIS — R112 Nausea with vomiting, unspecified: Secondary | ICD-10-CM

## 2014-11-07 LAB — COMPREHENSIVE METABOLIC PANEL (CC13)
ALBUMIN: 3.6 g/dL (ref 3.5–5.0)
ALT: 9 U/L (ref 0–55)
ANION GAP: 12 meq/L — AB (ref 3–11)
AST: 12 U/L (ref 5–34)
Alkaline Phosphatase: 75 U/L (ref 40–150)
BUN: 21.2 mg/dL (ref 7.0–26.0)
CALCIUM: 6.5 mg/dL — AB (ref 8.4–10.4)
CHLORIDE: 87 meq/L — AB (ref 98–109)
CO2: 27 mEq/L (ref 22–29)
Creatinine: 1 mg/dL (ref 0.6–1.1)
EGFR: 55 mL/min/{1.73_m2} — AB (ref >90)
GLUCOSE: 93 mg/dL (ref 70–140)
POTASSIUM: 3.9 meq/L (ref 3.5–5.1)
Sodium: 125 mEq/L — ABNORMAL LOW (ref 136–145)
Total Bilirubin: 1.09 mg/dL (ref 0.20–1.20)
Total Protein: 6.9 g/dL (ref 6.4–8.3)

## 2014-11-07 LAB — CBC WITH DIFFERENTIAL/PLATELET
BASO%: 0.2 % (ref 0.0–2.0)
BASOS ABS: 0 10*3/uL (ref 0.0–0.1)
EOS ABS: 0.1 10*3/uL (ref 0.0–0.5)
EOS%: 2.7 % (ref 0.0–7.0)
HCT: 25.3 % — ABNORMAL LOW (ref 34.8–46.6)
HEMOGLOBIN: 8.9 g/dL — AB (ref 11.6–15.9)
LYMPH#: 0.5 10*3/uL — AB (ref 0.9–3.3)
LYMPH%: 16.4 % (ref 14.0–49.7)
MCH: 30.5 pg (ref 25.1–34.0)
MCHC: 35.3 g/dL (ref 31.5–36.0)
MCV: 86.2 fL (ref 79.5–101.0)
MONO#: 0.1 10*3/uL (ref 0.1–0.9)
MONO%: 3.8 % (ref 0.0–14.0)
NEUT%: 76.9 % — AB (ref 38.4–76.8)
NEUTROS ABS: 2.3 10*3/uL (ref 1.5–6.5)
Platelets: 96 10*3/uL — ABNORMAL LOW (ref 145–400)
RBC: 2.94 10*6/uL — ABNORMAL LOW (ref 3.70–5.45)
RDW: 13.6 % (ref 11.2–14.5)
WBC: 2.9 10*3/uL — ABNORMAL LOW (ref 3.9–10.3)

## 2014-11-07 MED ORDER — HEPARIN SOD (PORK) LOCK FLUSH 100 UNIT/ML IV SOLN
500.0000 [IU] | Freq: Once | INTRAVENOUS | Status: AC
  Administered 2014-11-07: 500 [IU] via INTRAVENOUS
  Filled 2014-11-07: qty 5

## 2014-11-07 MED ORDER — SODIUM CHLORIDE 0.9 % IJ SOLN
10.0000 mL | INTRAMUSCULAR | Status: DC | PRN
  Administered 2014-11-07: 10 mL via INTRAVENOUS
  Filled 2014-11-07: qty 10

## 2014-11-07 MED ORDER — LORAZEPAM 2 MG/ML IJ SOLN
0.5000 mg | Freq: Once | INTRAMUSCULAR | Status: AC
  Administered 2014-11-07: 0.5 mg via INTRAVENOUS

## 2014-11-07 MED ORDER — SODIUM CHLORIDE 0.9 % IV SOLN
Freq: Once | INTRAVENOUS | Status: AC
  Administered 2014-11-07: 11:00:00 via INTRAVENOUS
  Filled 2014-11-07: qty 4

## 2014-11-07 MED ORDER — DIPHENOXYLATE-ATROPINE 2.5-0.025 MG PO TABS: 2.0000 | ORAL_TABLET | Freq: Four times a day (QID) | ORAL | Status: DC | PRN

## 2014-11-07 MED ORDER — LORAZEPAM 2 MG/ML IJ SOLN
INTRAMUSCULAR | Status: AC
  Filled 2014-11-07: qty 1

## 2014-11-07 MED ORDER — SODIUM CHLORIDE 0.9 % IV SOLN
1500.0000 mL | INTRAVENOUS | Status: AC
  Administered 2014-11-07: 10:00:00 via INTRAVENOUS

## 2014-11-07 NOTE — Patient Instructions (Signed)
Dehydration, Adult Dehydration is when you lose more fluids from the body than you take in. Vital organs like the kidneys, brain, and heart cannot function without a proper amount of fluids and salt. Any loss of fluids from the body can cause dehydration.  CAUSES   Vomiting.  Diarrhea.  Excessive sweating.  Excessive urine output.  Fever. SYMPTOMS  Mild dehydration  Thirst.  Dry lips.  Slightly dry mouth. Moderate dehydration  Very dry mouth.  Sunken eyes.  Skin does not bounce back quickly when lightly pinched and released.  Dark urine and decreased urine production.  Decreased tear production.  Headache. Severe dehydration  Very dry mouth.  Extreme thirst.  Rapid, weak pulse (more than 100 beats per minute at rest).  Cold hands and feet.  Not able to sweat in spite of heat and temperature.  Rapid breathing.  Blue lips.  Confusion and lethargy.  Difficulty being awakened.  Minimal urine production.  No tears. DIAGNOSIS  Your caregiver will diagnose dehydration based on your symptoms and your exam. Blood and urine tests will help confirm the diagnosis. The diagnostic evaluation should also identify the cause of dehydration. TREATMENT  Treatment of mild or moderate dehydration can often be done at home by increasing the amount of fluids that you drink. It is best to drink small amounts of fluid more often. Drinking too much at one time can make vomiting worse. Refer to the home care instructions below. Severe dehydration needs to be treated at the hospital where you will probably be given intravenous (IV) fluids that contain water and electrolytes. HOME CARE INSTRUCTIONS   Ask your caregiver about specific rehydration instructions.  Drink enough fluids to keep your urine clear or pale yellow.  Drink small amounts frequently if you have nausea and vomiting.  Eat as you normally do.  Avoid:  Foods or drinks high in sugar.  Carbonated  drinks.  Juice.  Extremely hot or cold fluids.  Drinks with caffeine.  Fatty, greasy foods.  Alcohol.  Tobacco.  Overeating.  Gelatin desserts.  Wash your hands well to avoid spreading bacteria and viruses.  Only take over-the-counter or prescription medicines for pain, discomfort, or fever as directed by your caregiver.  Ask your caregiver if you should continue all prescribed and over-the-counter medicines.  Keep all follow-up appointments with your caregiver. SEEK MEDICAL CARE IF:  You have abdominal pain and it increases or stays in one area (localizes).  You have a rash, stiff neck, or severe headache.  You are irritable, sleepy, or difficult to awaken.  You are weak, dizzy, or extremely thirsty. SEEK IMMEDIATE MEDICAL CARE IF:   You are unable to keep fluids down or you get worse despite treatment.  You have frequent episodes of vomiting or diarrhea.  You have blood or green matter (bile) in your vomit.  You have blood in your stool or your stool looks black and tarry.  You have not urinated in 6 to 8 hours, or you have only urinated a small amount of very dark urine.  You have a fever.  You faint. MAKE SURE YOU:   Understand these instructions.  Will watch your condition.  Will get help right away if you are not doing well or get worse. Document Released: 05/05/2005 Document Revised: 07/28/2011 Document Reviewed: 12/23/2010 [employer] Patient Information 2015 [employer]. This information is not intended to replace advice given to you by your health care provider. Make sure you discuss any questions you have with your health care   provider.  

## 2014-11-07 NOTE — Progress Notes (Signed)
Patient reports only slight nausea relief with IV zofran. Verbal order obtained from Selena Lesser, NP to give 0.5 mg IV ativan now. Patient informed and verbalizes understanding.

## 2014-11-07 NOTE — Patient Instructions (Signed)

## 2014-11-08 ENCOUNTER — Ambulatory Visit
Admission: RE | Admit: 2014-11-08 | Discharge: 2014-11-08 | Disposition: A | Discharge status: Home / Self Care | Payer: Medicare Other | Source: Ambulatory Visit | Attending: Radiation Oncology | Admitting: Radiation Oncology

## 2014-11-08 ENCOUNTER — Encounter: Payer: Self-pay | Admitting: Nurse Practitioner

## 2014-11-08 ENCOUNTER — Telehealth: Payer: Self-pay | Admitting: Internal Medicine

## 2014-11-08 ENCOUNTER — Other Ambulatory Visit: Payer: Medicare Other

## 2014-11-08 ENCOUNTER — Telehealth: Payer: Self-pay

## 2014-11-08 ENCOUNTER — Telehealth: Payer: Self-pay | Admitting: *Deleted

## 2014-11-08 ENCOUNTER — Ambulatory Visit: Admission: RE | Admit: 2014-11-08 | Payer: Medicare Other | Source: Ambulatory Visit

## 2014-11-08 DIAGNOSIS — Z87891 Personal history of nicotine dependence: Secondary | ICD-10-CM | POA: Diagnosis not present

## 2014-11-08 DIAGNOSIS — E785 Hyperlipidemia, unspecified: Secondary | ICD-10-CM | POA: Insufficient documentation

## 2014-11-08 DIAGNOSIS — C801 Malignant (primary) neoplasm, unspecified: Secondary | ICD-10-CM | POA: Diagnosis present

## 2014-11-08 DIAGNOSIS — C7951 Secondary malignant neoplasm of bone: Secondary | ICD-10-CM | POA: Diagnosis present

## 2014-11-08 DIAGNOSIS — R531 Weakness: Secondary | ICD-10-CM | POA: Insufficient documentation

## 2014-11-08 DIAGNOSIS — K219 Gastro-esophageal reflux disease without esophagitis: Secondary | ICD-10-CM | POA: Insufficient documentation

## 2014-11-08 DIAGNOSIS — K449 Diaphragmatic hernia without obstruction or gangrene: Secondary | ICD-10-CM | POA: Diagnosis not present

## 2014-11-08 DIAGNOSIS — C7800 Secondary malignant neoplasm of unspecified lung: Secondary | ICD-10-CM | POA: Insufficient documentation

## 2014-11-08 DIAGNOSIS — E213 Hyperparathyroidism, unspecified: Secondary | ICD-10-CM | POA: Diagnosis not present

## 2014-11-08 DIAGNOSIS — I10 Essential (primary) hypertension: Secondary | ICD-10-CM | POA: Insufficient documentation

## 2014-11-08 DIAGNOSIS — F419 Anxiety disorder, unspecified: Secondary | ICD-10-CM | POA: Diagnosis not present

## 2014-11-08 DIAGNOSIS — E559 Vitamin D deficiency, unspecified: Secondary | ICD-10-CM | POA: Insufficient documentation

## 2014-11-08 DIAGNOSIS — Z85118 Personal history of other malignant neoplasm of bronchus and lung: Secondary | ICD-10-CM | POA: Insufficient documentation

## 2014-11-08 DIAGNOSIS — Z510 Encounter for antineoplastic radiation therapy: Secondary | ICD-10-CM | POA: Diagnosis not present

## 2014-11-08 DIAGNOSIS — K589 Irritable bowel syndrome without diarrhea: Secondary | ICD-10-CM | POA: Diagnosis not present

## 2014-11-08 DIAGNOSIS — A047 Enterocolitis due to Clostridium difficile: Secondary | ICD-10-CM | POA: Diagnosis not present

## 2014-11-08 DIAGNOSIS — Z79899 Other long term (current) drug therapy: Secondary | ICD-10-CM | POA: Insufficient documentation

## 2014-11-08 DIAGNOSIS — M810 Age-related osteoporosis without current pathological fracture: Secondary | ICD-10-CM | POA: Diagnosis not present

## 2014-11-08 DIAGNOSIS — M4800 Spinal stenosis, site unspecified: Secondary | ICD-10-CM | POA: Insufficient documentation

## 2014-11-08 NOTE — Assessment & Plan Note (Signed)
Patient has a chronic tremor as baseline.  She states that she was diagnosed with Parkinson's disease in January 2016.

## 2014-11-08 NOTE — Progress Notes (Signed)
SYMPTOM MANAGEMENT CLINIC   HPI: [name] 74 y.o. female diagnosed with lung cancer; with bone metastasis.  Currently undergoing cisplatin/irinotecan chemotherapy regimen.  Patient last received chemotherapy on 11/01/2014.  Since that time.-Patient has been experiencing chronic diarrhea, nausea, and increased fatigue/weakness.  She feels fairly dehydrated today.  She also is complaining of a chronic productive cough with some yellow secretions on occasion.  She denies any recent fevers or chills.  HPI  ROS  Past Medical History  Diagnosis Date  . GERD (gastroesophageal reflux disease)   . IBS (irritable bowel syndrome)   . Adenomatous colon polyp 06/2009  . Hiatal hernia   . Breast cyst   . Diverticulosis   . Hemorrhoids   . Hyperlipidemia   . Hypertension   . Hyperparathyroidism   . Anxiety   . Migraine   . Osteoporosis   . Vitamin D deficiency   . Spinal stenosis   . C. difficile colitis 2006  . Small cell lung carcinoma dx'd 02/2014  . Bone metastases dx'd 02/2014    Past Surgical History  Procedure Laterality Date  . Cholecystectomy    . Vaginal hysterectomy    . Knee arthroscopy      left  . Parathyroidectomy  2009    [facility]  . Lumbar spine surgery  2001  . Breast biopsy      bilateral  . Total knee arthroplasty      right  . Bladder repair    . Video bronchoscopy Bilateral 03/14/2014    Procedure: VIDEO BRONCHOSCOPY WITHOUT FLUORO;  Surgeon: Tanda Rockers, MD;  Location: [facility];  Service: Cardiopulmonary;  Laterality: Bilateral;    has GERD; IRRITABLE BOWEL SYNDROME; CHEST PAIN; ABDOMINAL PAIN-EPIGASTRIC; NONSPEC ELEVATION OF LEVELS OF TRANSAMINASE/LDH; PERSONAL HX COLONIC POLYPS; Lung mass; Acute mid back pain secondary to lytic lesion T8  03/02/14; Small cell carcinoma of lung; Nausea with vomiting; Dehydration; Diarrhea; Hypokalemia; Thrombocytopenia; Neoplasm related pain; Tremor; Bone metastasis; Hyperbilirubinemia;  Hyperphosphatemia; Anemia in neoplastic disease; Hypocalcemia; Other pancytopenia; Pancytopenia; Neutropenia; Lung cancer; Acute pain; Protein-calorie malnutrition, severe; and Weakness on her problem list.    is allergic to ace inhibitors; cephalexin; etodolac; and gabapentin.    Medication List       This list is accurate as of: 11/07/14 11:59 PM.  Always use your most recent med list.               ALPRAZolam 0.5 MG tablet  Commonly known as:  XANAX  Take 0.5 mg by mouth at bedtime.     atorvastatin 80 MG tablet  Commonly known as:  LIPITOR  Take 80 mg by mouth every morning.     buPROPion 150 MG 12 hr tablet  Commonly known as:  WELLBUTRIN SR  Take 150 mg by mouth every morning.     CALCIUM PO  Take 1 tablet by mouth 3 (three) times daily. Starting 06/21/14--taking 68m three times daily     carbidopa-levodopa 25-100 MG per tablet  Commonly known as:  SINEMET IR  Take 1 tablet by mouth 3 (three) times daily.     DEXILANT 60 MG capsule  Generic drug:  dexlansoprazole  TAKE ONE CAPSULE BY MOUTH DAILY     diphenoxylate-atropine 2.5-0.025 MG per tablet  Commonly known as:  LOMOTIL  Take 2 tablets by mouth 4 (four) times daily as needed for diarrhea or loose stools.     feeding supplement (ENSURE COMPLETE) Liqd  Take 237 mLs by mouth 2 (two) times  daily between meals.     glycopyrrolate 2 MG tablet  Commonly known as:  ROBINUL  TAKE 1 TABLET (2 MG TOTAL) BY MOUTH 2 (TWO) TIMES DAILY.     hydrochlorothiazide 12.5 MG tablet  Commonly known as:  HYDRODIURIL  Take 1 tablet by mouth every morning.     KLOR-CON M20 20 MEQ tablet  Generic drug:  potassium chloride SA  TAKE 1 TABLET BY MOUTH EVERY DAY     lidocaine-prilocaine cream  Commonly known as:  EMLA  Apply 1 application topically as needed. Apply to port 1 hr before chemo     LORazepam 0.5 MG tablet  Commonly known as:  ATIVAN  Take 1 tablet (0.5 mg total) by mouth every 8 (eight) hours.     metoprolol  succinate 50 MG 24 hr tablet  Commonly known as:  TOPROL-XL  Take 50 mg by mouth every morning.     MIRALAX PO  Take 1 scoop by mouth daily as needed (for constipation).     ondansetron 8 MG tablet  Commonly known as:  ZOFRAN  Take 1 tablet (8 mg total) by mouth every 8 (eight) hours as needed for nausea or vomiting.     oxyCODONE-acetaminophen 5-325 MG per tablet  Commonly known as:  PERCOCET/ROXICET  Take 1 tablet by mouth every 6 (six) hours as needed for severe pain.     prochlorperazine 10 MG tablet  Commonly known as:  COMPAZINE  Take 1 tablet (10 mg total) by mouth every 6 (six) hours as needed for nausea or vomiting.     rOPINIRole 1 MG tablet  Commonly known as:  REQUIP  TAKE 1 TABLET (1 MG TOTAL) BY MOUTH 3 (THREE) TIMES DAILY.     ZORVOLEX 35 MG Caps  Generic drug:  Diclofenac  Take 35 mg by mouth every morning.         PHYSICAL EXAMINATION  Oncology Vitals 11/07/2014 11/01/2014 10/25/2014 10/18/2014 10/13/2014 08/15/2014 08/01/2014  Height - 165 cm - 165 cm - 165 cm -  Weight 55.838 kg 56.836 kg - 57.698 kg - 55.021 kg -  Weight (lbs) 123 lbs 2 oz 125 lbs 5 oz - 127 lbs 3 oz - 121 lbs 5 oz -  BMI (kg/m2) - 20.85 kg/m2 - 21.17 kg/m2 - 20.19 kg/m2 -  Temp 98.5 98.1 97.7 97.9 97.8 98.5 97.9  Pulse 90 91 65 67 69 100 105  Resp 16 18 17 18  - 18 -  SpO2 98 100 100 100 - 100 -  BSA (m2) - 1.61 m2 - 1.63 m2 - 1.59 m2 -   BP Readings from Last 3 Encounters:  11/07/14 113/64  11/01/14 118/50  10/25/14 130/64    Physical Exam  Constitutional: She is oriented to person, place, and time.  Patient appears fatigued, weak, frail, and chronically ill.  HENT:  Head: Normocephalic and atraumatic.  Mouth/Throat: Oropharynx is clear and moist.  Eyes: Conjunctivae and EOM are normal. Pupils are equal, round, and reactive to light. Right eye exhibits no discharge. Left eye exhibits no discharge. No scleral icterus.  Neck: Normal range of motion. Neck supple. No JVD present. No  tracheal deviation present. No thyromegaly present.  Cardiovascular: Normal rate, regular rhythm, normal heart sounds and intact distal pulses.   Pulmonary/Chest: Effort normal and breath sounds normal. No respiratory distress. She has no wheezes. She has no rales. She exhibits no tenderness.  Occasional congested cough.  No acute respiratory distress.  Abdominal: Soft. Bowel sounds are normal.  She exhibits no distension and no mass. There is no tenderness. There is no rebound and no guarding.  Musculoskeletal: Normal range of motion. She exhibits no edema or tenderness.  Lymphadenopathy:    She has no cervical adenopathy.  Neurological: She is alert and oriented to person, place, and time.  Patient in a wheelchair during exam.  Skin: Skin is warm and dry. No rash noted. No erythema. There is pallor.  Psychiatric: Affect normal.  Nursing note and vitals reviewed.   LABORATORY DATA:. Appointment on 11/07/2014  Component Date Value Ref Range Status  . WBC 11/07/2014 2.9* 3.9 - 10.3 10e3/uL Final  . NEUT# 11/07/2014 2.3  1.5 - 6.5 10e3/uL Final  . HGB 11/07/2014 8.9* 11.6 - 15.9 g/dL Final  . HCT 11/07/2014 25.3* 34.8 - 46.6 % Final  . Platelets 11/07/2014 96* 145 - 400 10e3/uL Final  . MCV 11/07/2014 86.2  79.5 - 101.0 fL Final  . MCH 11/07/2014 30.5  25.1 - 34.0 pg Final  . MCHC 11/07/2014 35.3  31.5 - 36.0 g/dL Final  . RBC 11/07/2014 2.94* 3.70 - 5.45 10e6/uL Final  . RDW 11/07/2014 13.6  11.2 - 14.5 % Final  . lymph# 11/07/2014 0.5* 0.9 - 3.3 10e3/uL Final  . MONO# 11/07/2014 0.1  0.1 - 0.9 10e3/uL Final  . Eosinophils Absolute 11/07/2014 0.1  0.0 - 0.5 10e3/uL Final  . Basophils Absolute 11/07/2014 0.0  0.0 - 0.1 10e3/uL Final  . NEUT% 11/07/2014 76.9* 38.4 - 76.8 % Final  . LYMPH% 11/07/2014 16.4  14.0 - 49.7 % Final  . MONO% 11/07/2014 3.8  0.0 - 14.0 % Final  . EOS% 11/07/2014 2.7  0.0 - 7.0 % Final  . BASO% 11/07/2014 0.2  0.0 - 2.0 % Final  . Sodium 11/07/2014 125* 136 -  145 mEq/L Final  . Potassium 11/07/2014 3.9  3.5 - 5.1 mEq/L Final  . Chloride 11/07/2014 87* 98 - 109 mEq/L Final  . CO2 11/07/2014 27  22 - 29 mEq/L Final  . Glucose 11/07/2014 93  70 - 140 mg/dl Final  . BUN 11/07/2014 21.2  7.0 - 26.0 mg/dL Final  . Creatinine 11/07/2014 1.0  0.6 - 1.1 mg/dL Final  . Total Bilirubin 11/07/2014 1.09  0.20 - 1.20 mg/dL Final  . Alkaline Phosphatase 11/07/2014 75  40 - 150 U/L Final  . AST 11/07/2014 12  5 - 34 U/L Final  . ALT 11/07/2014 9  0 - 55 U/L Final  . Total Protein 11/07/2014 6.9  6.4 - 8.3 g/dL Final  . Albumin 11/07/2014 3.6  3.5 - 5.0 g/dL Final  . Calcium 11/07/2014 6.5* 8.4 - 10.4 mg/dL Final  . Anion Gap 11/07/2014 12* 3 - 11 mEq/L Final  . EGFR 11/07/2014 55* >90 ml/min/1.73 m2 Final   eGFR is calculated using the CKD-EPI Creatinine Equation (2009)     RADIOGRAPHIC STUDIES: No results found.  ASSESSMENT/PLAN:    Small cell carcinoma of lung Patient received her last cycle of cisplatin/irinotecan chemotherapy on 11/01/2014.  Since that time.-Patient has been experiencing increased diarrhea, nausea, dehydration, and progressive fatigue.  WBC 2.9, ANC 2.3, hemoglobin 8.9, and platelet count 96.  Patient is scheduled to return on 11/15/2014 for labs, follow up visit, and her next cycle of chemotherapy.  If patient continues with diarrhea-may very well need to consider decreasing the dose of her next chemotherapy.    Nausea with vomiting Patient continues to complain of some chronic nausea; but no vomiting.  She states she  has been taking her antinausea medications at home with only moderate effectiveness.  Patient does appear dehydrated today and will receive IV fluid rehydration while at the cancer center.  She was also given Zofran and Ativan IV to help with her nausea.  Dehydration Patient is complaining of chronic diarrhea, nausea, and subsequent dehydration since her last cycle of chemotherapy.  She will receive IV fluid  rehydration while at the cancer Center today.  She'll also receive Zofran and Ativan IV today.  Patient was advised to continue with her antinausea medications at home as well.  Patient may very well require additional IV fluid rehydration later this week.  Diarrhea Patient is complaining of chronic diarrhea; and subsequent dehydration since her last cycle of chemotherapy.  Patient states that she has been using the maximum amount of Imodium allowed per day, with only minimal effectiveness.  We will order a stool for C. difficile.  We'll also prescribed Lomotil for the patient to alternate with the Imodium.  Tremor Patient has a chronic tremor as baseline.  She states that she was diagnosed with Parkinson's disease in January 2016.  Weakness Patient is complaining of progressive weakness.  Most likely secondary to her chronic diarrhea, nausea, and subsequent dehydration.  Patient will receive IV fluid rehydration today.  She was also encouraged to remain as active as possible.  Patient stated understanding of all instructions; and was in agreement with this plan of care. The patient knows to call the clinic with any problems, questions or concerns.   This was a shared visit with Dr. Julien Nordmann today.  Total time spent with patient was 40 minutes;  with greater than 75 percent of that time spent in face to face counseling regarding patient's symptoms,  and coordination of care and follow up.  Disclaimer: This note was dictated with voice recognition software. Similar sounding words can inadvertently be transcribed and may not be corrected upon review.   Drue Second, NP 11/08/2014   ADDENDUM: Hematology/Oncology Attending: I had a face to face encounter with the patient. I recommended her care plan. This is a very pleasant 74 years old white female with extensive stage small cell lung cancer who is currently undergoing second line chemotherapy with reduced dose cisplatin and irinotecan. She  is status post 1 cycle. Her treatment is complicated with nausea as well as diarrhea. The patient has poor by mouth intake. Her diarrhea is most likely secondary to her treatment with irinotecan. I recommended for the patient to continue with the Imodium and we will also give her prescription for Lomotil. We will arrange for the patient to receive IV hydration with antinausea medication today. I may consider reducing her dose of the chemotherapy starting from cycle #2 2 cisplatin 25 MG/M2 and irinotecan 50 MG/M2 on days 1 and 8 every 3 weeks. She will come back for follow-up visit in 2 weeks for reevaluation before starting cycle #2. The patient was advised to call if she has any concerning symptoms in the interval.  Disclaimer: This note was dictated with voice recognition software. Similar sounding words can inadvertently be transcribed and may be missed upon review. Eilleen Kempf., MD 11/08/2014

## 2014-11-08 NOTE — Assessment & Plan Note (Signed)
Patient received her last cycle of cisplatin/irinotecan chemotherapy on 11/01/2014.  Since that time.-Patient has been experiencing increased diarrhea, nausea, dehydration, and progressive fatigue.  WBC 2.9, ANC 2.3, hemoglobin 8.9, and platelet count 96.  Patient is scheduled to return on 11/15/2014 for labs, follow up visit, and her next cycle of chemotherapy.  If patient continues with diarrhea-may very well need to consider decreasing the dose of her next chemotherapy.

## 2014-11-08 NOTE — Telephone Encounter (Signed)
Called patient cell phone first left vm and then called home phone,patient appt for 730 am today, daughter answered and said her dad had called answering service and cancelled todays appt with Dr. Jones Broom is sick on her stomach, they will call back another time to reschedule,thanked daughter and let her know I had left vm on patient's cell phone to disregard it 7:48 AM

## 2014-11-08 NOTE — Telephone Encounter (Signed)
returned call and s.w. pt husband and cx todays appt....pt has diarea

## 2014-11-08 NOTE — Assessment & Plan Note (Signed)
Patient is complaining of progressive weakness.  Most likely secondary to her chronic diarrhea, nausea, and subsequent dehydration.  Patient will receive IV fluid rehydration today.  She was also encouraged to remain as active as possible.

## 2014-11-08 NOTE — Assessment & Plan Note (Signed)
Patient is complaining of chronic diarrhea; and subsequent dehydration since her last cycle of chemotherapy.  Patient states that she has been using the maximum amount of Imodium allowed per day, with only minimal effectiveness.  We will order a stool for C. difficile.  We'll also prescribed Lomotil for the patient to alternate with the Imodium.

## 2014-11-08 NOTE — Telephone Encounter (Signed)
Called to follow up with patient after [facility] visit yesterday 6/21. Spoke with pt husband who states she is having horrible diarrhea this morning. Pt husband states someone is going to pick up her prescription for lomotil right now. Informed Mr.Sloane to try the medication, have pt stay well hydrated today and to call this afternoon if it still didn't seem any better. Mr.Borman verbalized understanding and denies any questions or concerns.

## 2014-11-08 NOTE — Assessment & Plan Note (Signed)
Patient continues to complain of some chronic nausea; but no vomiting.  She states she has been taking her antinausea medications at home with only moderate effectiveness.  Patient does appear dehydrated today and will receive IV fluid rehydration while at the cancer center.  She was also given Zofran and Ativan IV to help with her nausea.

## 2014-11-08 NOTE — Assessment & Plan Note (Signed)
Patient is complaining of chronic diarrhea, nausea, and subsequent dehydration since her last cycle of chemotherapy.  She will receive IV fluid rehydration while at the cancer Center today.  She'll also receive Zofran and Ativan IV today.  Patient was advised to continue with her antinausea medications at home as well.  Patient may very well require additional IV fluid rehydration later this week.

## 2014-11-09 ENCOUNTER — Ambulatory Visit (HOSPITAL_BASED_OUTPATIENT_CLINIC_OR_DEPARTMENT_OTHER): Payer: Medicare Other

## 2014-11-09 ENCOUNTER — Other Ambulatory Visit: Payer: Self-pay | Admitting: Nurse Practitioner

## 2014-11-09 ENCOUNTER — Telehealth: Payer: Self-pay

## 2014-11-09 VITALS — BP 111/56 | HR 86 | Temp 98.4°F | Resp 18

## 2014-11-09 DIAGNOSIS — E860 Dehydration: Secondary | ICD-10-CM | POA: Diagnosis not present

## 2014-11-09 MED ORDER — SODIUM CHLORIDE 0.9 % IV SOLN
1000.0000 mL | Freq: Once | INTRAVENOUS | Status: AC
  Administered 2014-11-09: 1000 mL via INTRAVENOUS

## 2014-11-09 MED ORDER — SODIUM CHLORIDE 0.9 % IJ SOLN
10.0000 mL | INTRAMUSCULAR | Status: AC | PRN
  Administered 2014-11-09: 10 mL
  Filled 2014-11-09: qty 10

## 2014-11-09 MED ORDER — HEPARIN SOD (PORK) LOCK FLUSH 100 UNIT/ML IV SOLN
500.0000 [IU] | INTRAVENOUS | Status: AC | PRN
  Administered 2014-11-09: 500 [IU]
  Filled 2014-11-09: qty 5

## 2014-11-09 MED ORDER — SODIUM CHLORIDE 0.9 % IV SOLN
INTRAVENOUS | Status: AC
  Administered 2014-11-09: 14:00:00 via INTRAVENOUS

## 2014-11-09 NOTE — Patient Instructions (Signed)
Dehydration, Adult Dehydration is when you lose more fluids from the body than you take in. Vital organs like the kidneys, brain, and heart cannot function without a proper amount of fluids and salt. Any loss of fluids from the body can cause dehydration.  CAUSES   Vomiting.  Diarrhea.  Excessive sweating.  Excessive urine output.  Fever. SYMPTOMS  Mild dehydration  Thirst.  Dry lips.  Slightly dry mouth. Moderate dehydration  Very dry mouth.  Sunken eyes.  Skin does not bounce back quickly when lightly pinched and released.  Dark urine and decreased urine production.  Decreased tear production.  Headache. Severe dehydration  Very dry mouth.  Extreme thirst.  Rapid, weak pulse (more than 100 beats per minute at rest).  Cold hands and feet.  Not able to sweat in spite of heat and temperature.  Rapid breathing.  Blue lips.  Confusion and lethargy.  Difficulty being awakened.  Minimal urine production.  No tears. DIAGNOSIS  Your caregiver will diagnose dehydration based on your symptoms and your exam. Blood and urine tests will help confirm the diagnosis. The diagnostic evaluation should also identify the cause of dehydration. TREATMENT  Treatment of mild or moderate dehydration can often be done at home by increasing the amount of fluids that you drink. It is best to drink small amounts of fluid more often. Drinking too much at one time can make vomiting worse. Refer to the home care instructions below. Severe dehydration needs to be treated at the hospital where you will probably be given intravenous (IV) fluids that contain water and electrolytes. HOME CARE INSTRUCTIONS   Ask your caregiver about specific rehydration instructions.  Drink enough fluids to keep your urine clear or pale yellow.  Drink small amounts frequently if you have nausea and vomiting.  Eat as you normally do.  Avoid:  Foods or drinks high in sugar.  Carbonated  drinks.  Juice.  Extremely hot or cold fluids.  Drinks with caffeine.  Fatty, greasy foods.  Alcohol.  Tobacco.  Overeating.  Gelatin desserts.  Wash your hands well to avoid spreading bacteria and viruses.  Only take over-the-counter or prescription medicines for pain, discomfort, or fever as directed by your caregiver.  Ask your caregiver if you should continue all prescribed and over-the-counter medicines.  Keep all follow-up appointments with your caregiver. SEEK MEDICAL CARE IF:  You have abdominal pain and it increases or stays in one area (localizes).  You have a rash, stiff neck, or severe headache.  You are irritable, sleepy, or difficult to awaken.  You are weak, dizzy, or extremely thirsty. SEEK IMMEDIATE MEDICAL CARE IF:   You are unable to keep fluids down or you get worse despite treatment.  You have frequent episodes of vomiting or diarrhea.  You have blood or green matter (bile) in your vomit.  You have blood in your stool or your stool looks black and tarry.  You have not urinated in 6 to 8 hours, or you have only urinated a small amount of very dark urine.  You have a fever.  You faint. MAKE SURE YOU:   Understand these instructions.  Will watch your condition.  Will get help right away if you are not doing well or get worse. Document Released: 05/05/2005 Document Revised: 07/28/2011 Document Reviewed: 12/23/2010 [employer] Patient Information 2015 [employer]. This information is not intended to replace advice given to you by your health care provider. Make sure you discuss any questions you have with your health care   provider.  

## 2014-11-09 NOTE — Progress Notes (Signed)
Patient reports having clear bowl movements x3 today. She reports multiple "watery" bowl movements yesterday as well. Patient has decreased appetite and mild nausea and has not eaten since yesterday at lunch which consisted of a spoonful of chicken noodle soup. Patient received a liter of fluids and an additional 500 ml after writer spoke with Michel Harrow FNP. Patient educated on her medication prescribed for diarrhea and the importance of collecting a stool specimen even if it is clear liquid. Per C. Berniece Salines FNP patient can return tomorrow for additional fluids and is scheduled for 6/24 at 1445.  Pt's husband will encourage fluids, jello, broth and popsicles. No further needs voiced by pt.

## 2014-11-09 NOTE — Telephone Encounter (Signed)
Called to follow up with pt this am. Pt husband states she is still having bad diarrhea and the lomotil wasn't helping much. Informed Mr.Tartt to bring pt in about 1130 for iv fluids at 12. Mr.Mixon verbalized understanding.

## 2014-11-10 ENCOUNTER — Other Ambulatory Visit (HOSPITAL_COMMUNITY)
Admission: AD | Admit: 2014-11-10 | Discharge: 2014-11-10 | Disposition: A | Discharge status: Home / Self Care | Payer: Medicare Other | Source: Ambulatory Visit | Attending: Internal Medicine | Admitting: Internal Medicine

## 2014-11-10 ENCOUNTER — Ambulatory Visit (HOSPITAL_BASED_OUTPATIENT_CLINIC_OR_DEPARTMENT_OTHER): Payer: Medicare Other

## 2014-11-10 ENCOUNTER — Other Ambulatory Visit: Payer: Self-pay | Admitting: Pharmacist

## 2014-11-10 ENCOUNTER — Ambulatory Visit (HOSPITAL_BASED_OUTPATIENT_CLINIC_OR_DEPARTMENT_OTHER): Payer: Medicare Other | Admitting: Nurse Practitioner

## 2014-11-10 ENCOUNTER — Other Ambulatory Visit: Payer: Self-pay | Admitting: *Deleted

## 2014-11-10 ENCOUNTER — Telehealth: Payer: Self-pay | Admitting: Internal Medicine

## 2014-11-10 ENCOUNTER — Other Ambulatory Visit: Payer: Self-pay | Admitting: Nurse Practitioner

## 2014-11-10 VITALS — BP 133/46 | HR 85 | Temp 98.6°F | Resp 16

## 2014-11-10 DIAGNOSIS — D61810 Antineoplastic chemotherapy induced pancytopenia: Secondary | ICD-10-CM

## 2014-11-10 DIAGNOSIS — C3491 Malignant neoplasm of unspecified part of right bronchus or lung: Secondary | ICD-10-CM

## 2014-11-10 DIAGNOSIS — Z87891 Personal history of nicotine dependence: Secondary | ICD-10-CM | POA: Diagnosis not present

## 2014-11-10 DIAGNOSIS — E162 Hypoglycemia, unspecified: Secondary | ICD-10-CM

## 2014-11-10 DIAGNOSIS — C7951 Secondary malignant neoplasm of bone: Secondary | ICD-10-CM

## 2014-11-10 DIAGNOSIS — C3490 Malignant neoplasm of unspecified part of unspecified bronchus or lung: Secondary | ICD-10-CM

## 2014-11-10 DIAGNOSIS — T451X5A Adverse effect of antineoplastic and immunosuppressive drugs, initial encounter: Secondary | ICD-10-CM

## 2014-11-10 DIAGNOSIS — R531 Weakness: Secondary | ICD-10-CM

## 2014-11-10 DIAGNOSIS — E860 Dehydration: Secondary | ICD-10-CM

## 2014-11-10 DIAGNOSIS — R197 Diarrhea, unspecified: Secondary | ICD-10-CM

## 2014-11-10 DIAGNOSIS — R112 Nausea with vomiting, unspecified: Secondary | ICD-10-CM

## 2014-11-10 LAB — SAMPLE TO BLOOD BANK

## 2014-11-10 LAB — CBC WITH DIFFERENTIAL/PLATELET
BASO%: 0.6 % (ref 0.0–2.0)
Basophils Absolute: 0 10*3/uL (ref 0.0–0.1)
EOS%: 4.5 % (ref 0.0–7.0)
Eosinophils Absolute: 0.1 10*3/uL (ref 0.0–0.5)
HCT: 21.2 % — ABNORMAL LOW (ref 34.8–46.6)
HGB: 7.6 g/dL — ABNORMAL LOW (ref 11.6–15.9)
LYMPH%: 38.5 % (ref 14.0–49.7)
MCH: 30.9 pg (ref 25.1–34.0)
MCHC: 35.8 g/dL (ref 31.5–36.0)
MCV: 86.2 fL (ref 79.5–101.0)
MONO#: 0.2 10*3/uL (ref 0.1–0.9)
MONO%: 9.5 % (ref 0.0–14.0)
NEUT%: 46.9 % (ref 38.4–76.8)
NEUTROS ABS: 0.8 10*3/uL — AB (ref 1.5–6.5)
Platelets: 56 10*3/uL — ABNORMAL LOW (ref 145–400)
RBC: 2.46 10*6/uL — AB (ref 3.70–5.45)
RDW: 13.2 % (ref 11.2–14.5)
WBC: 1.8 10*3/uL — AB (ref 3.9–10.3)
lymph#: 0.7 10*3/uL — ABNORMAL LOW (ref 0.9–3.3)

## 2014-11-10 LAB — COMPREHENSIVE METABOLIC PANEL (CC13)
ALBUMIN: 3.2 g/dL — AB (ref 3.5–5.0)
ALK PHOS: 65 U/L (ref 40–150)
ALT: 8 U/L (ref 0–55)
AST: 13 U/L (ref 5–34)
Anion Gap: 15 mEq/L — ABNORMAL HIGH (ref 3–11)
BUN: 18.7 mg/dL (ref 7.0–26.0)
CHLORIDE: 96 meq/L — AB (ref 98–109)
CO2: 21 mEq/L — ABNORMAL LOW (ref 22–29)
Calcium: 5.4 mg/dL — CL (ref 8.4–10.4)
Creatinine: 0.8 mg/dL (ref 0.6–1.1)
EGFR: 72 mL/min/{1.73_m2} — ABNORMAL LOW (ref >90)
GLUCOSE: 67 mg/dL — AB (ref 70–140)
POTASSIUM: 3.5 meq/L (ref 3.5–5.1)
SODIUM: 132 meq/L — AB (ref 136–145)
TOTAL PROTEIN: 6.3 g/dL — AB (ref 6.4–8.3)
Total Bilirubin: 0.71 mg/dL (ref 0.20–1.20)

## 2014-11-10 LAB — CLOSTRIDIUM DIFFICILE BY PCR: CDIFFPCR: NEGATIVE

## 2014-11-10 MED ORDER — SODIUM CHLORIDE 0.9 % IV SOLN
INTRAVENOUS | Status: DC
  Administered 2014-11-10: 15:00:00 via INTRAVENOUS

## 2014-11-10 MED ORDER — SODIUM CHLORIDE 0.9 % IV SOLN
Freq: Once | INTRAVENOUS | Status: AC
  Administered 2014-11-10: 18:00:00 via INTRAVENOUS
  Filled 2014-11-10: qty 250

## 2014-11-10 NOTE — Telephone Encounter (Signed)
Left message on voicemail for patient on home/cell re appointment for lab 11/13/14 @ 11:45 am. No other orders per 6/24 pof.

## 2014-11-10 NOTE — Progress Notes (Signed)
Crossmatch added after hours. Blood bank notified that [place] will be added in AM. Will need to release type and screen order once HAR is added during infusion on 6/25.

## 2014-11-10 NOTE — Patient Instructions (Signed)
Dehydration, Adult Dehydration is when you lose more fluids from the body than you take in. Vital organs like the kidneys, brain, and heart cannot function without a proper amount of fluids and salt. Any loss of fluids from the body can cause dehydration.  CAUSES   Vomiting.  Diarrhea.  Excessive sweating.  Excessive urine output.  Fever. SYMPTOMS  Mild dehydration  Thirst.  Dry lips.  Slightly dry mouth. Moderate dehydration  Very dry mouth.  Sunken eyes.  Skin does not bounce back quickly when lightly pinched and released.  Dark urine and decreased urine production.  Decreased tear production.  Headache. Severe dehydration  Very dry mouth.  Extreme thirst.  Rapid, weak pulse (more than 100 beats per minute at rest).  Cold hands and feet.  Not able to sweat in spite of heat and temperature.  Rapid breathing.  Blue lips.  Confusion and lethargy.  Difficulty being awakened.  Minimal urine production.  No tears. DIAGNOSIS  Your caregiver will diagnose dehydration based on your symptoms and your exam. Blood and urine tests will help confirm the diagnosis. The diagnostic evaluation should also identify the cause of dehydration. TREATMENT  Treatment of mild or moderate dehydration can often be done at home by increasing the amount of fluids that you drink. It is best to drink small amounts of fluid more often. Drinking too much at one time can make vomiting worse. Refer to the home care instructions below. Severe dehydration needs to be treated at the hospital where you will probably be given intravenous (IV) fluids that contain water and electrolytes. HOME CARE INSTRUCTIONS   Ask your caregiver about specific rehydration instructions.  Drink enough fluids to keep your urine clear or pale yellow.  Drink small amounts frequently if you have nausea and vomiting.  Eat as you normally do.  Avoid:  Foods or drinks high in sugar.  Carbonated  drinks.  Juice.  Extremely hot or cold fluids.  Drinks with caffeine.  Fatty, greasy foods.  Alcohol.  Tobacco.  Overeating.  Gelatin desserts.  Wash your hands well to avoid spreading bacteria and viruses.  Only take over-the-counter or prescription medicines for pain, discomfort, or fever as directed by your caregiver.  Ask your caregiver if you should continue all prescribed and over-the-counter medicines.  Keep all follow-up appointments with your caregiver. SEEK MEDICAL CARE IF:  You have abdominal pain and it increases or stays in one area (localizes).  You have a rash, stiff neck, or severe headache.  You are irritable, sleepy, or difficult to awaken.  You are weak, dizzy, or extremely thirsty. SEEK IMMEDIATE MEDICAL CARE IF:   You are unable to keep fluids down or you get worse despite treatment.  You have frequent episodes of vomiting or diarrhea.  You have blood or green matter (bile) in your vomit.  You have blood in your stool or your stool looks black and tarry.  You have not urinated in 6 to 8 hours, or you have only urinated a small amount of very dark urine.  You have a fever.  You faint. MAKE SURE YOU:   Understand these instructions.  Will watch your condition.  Will get help right away if you are not doing well or get worse. Document Released: 05/05/2005 Document Revised: 07/28/2011 Document Reviewed: 12/23/2010 [employer] Patient Information 2015 [employer]. This information is not intended to replace advice given to you by your health care provider. Make sure you discuss any questions you have with your health care   provider.  

## 2014-11-10 NOTE — Progress Notes (Signed)
Hgb: 7.6. Selena Lesser NP notified. Patient will receive 2 units of PRBCs 07/13/14. Orders in signed/held.

## 2014-11-11 ENCOUNTER — Ambulatory Visit (HOSPITAL_BASED_OUTPATIENT_CLINIC_OR_DEPARTMENT_OTHER): Payer: Medicare Other

## 2014-11-11 ENCOUNTER — Ambulatory Visit (HOSPITAL_COMMUNITY)
Admission: RE | Admit: 2014-11-11 | Discharge: 2014-11-11 | Disposition: A | Discharge status: Home / Self Care | Payer: Medicare Other | Source: Ambulatory Visit | Attending: Internal Medicine | Admitting: Internal Medicine

## 2014-11-11 ENCOUNTER — Encounter: Payer: Self-pay | Admitting: Nurse Practitioner

## 2014-11-11 VITALS — BP 116/67 | HR 72 | Temp 97.9°F | Resp 18

## 2014-11-11 DIAGNOSIS — C3490 Malignant neoplasm of unspecified part of unspecified bronchus or lung: Secondary | ICD-10-CM | POA: Diagnosis not present

## 2014-11-11 DIAGNOSIS — D6481 Anemia due to antineoplastic chemotherapy: Secondary | ICD-10-CM | POA: Diagnosis not present

## 2014-11-11 DIAGNOSIS — D61810 Antineoplastic chemotherapy induced pancytopenia: Secondary | ICD-10-CM

## 2014-11-11 DIAGNOSIS — T451X5A Adverse effect of antineoplastic and immunosuppressive drugs, initial encounter: Secondary | ICD-10-CM

## 2014-11-11 LAB — PREPARE RBC (CROSSMATCH)

## 2014-11-11 MED ORDER — DIPHENHYDRAMINE HCL 25 MG PO CAPS
25.0000 mg | ORAL_CAPSULE | Freq: Once | ORAL | Status: AC
  Administered 2014-11-11: 25 mg via ORAL

## 2014-11-11 MED ORDER — ACETAMINOPHEN 325 MG PO TABS
ORAL_TABLET | ORAL | Status: AC
Start: 2014-11-11 — End: 2014-11-11
  Filled 2014-11-11: qty 2

## 2014-11-11 MED ORDER — SODIUM CHLORIDE 0.9 % IJ SOLN
10.0000 mL | INTRAMUSCULAR | Status: AC | PRN
  Administered 2014-11-11: 10 mL
  Filled 2014-11-11: qty 10

## 2014-11-11 MED ORDER — SODIUM CHLORIDE 0.9 % IV SOLN
250.0000 mL | Freq: Once | INTRAVENOUS | Status: AC
  Administered 2014-11-11: 250 mL via INTRAVENOUS

## 2014-11-11 MED ORDER — DIPHENHYDRAMINE HCL 25 MG PO CAPS
ORAL_CAPSULE | ORAL | Status: AC
  Filled 2014-11-11: qty 1

## 2014-11-11 MED ORDER — ACETAMINOPHEN 325 MG PO TABS
650.0000 mg | ORAL_TABLET | Freq: Once | ORAL | Status: AC
  Administered 2014-11-11: 650 mg via ORAL

## 2014-11-11 MED ORDER — HEPARIN SOD (PORK) LOCK FLUSH 100 UNIT/ML IV SOLN
500.0000 [IU] | Freq: Every day | INTRAVENOUS | Status: AC | PRN
  Administered 2014-11-11: 500 [IU]
  Filled 2014-11-11: qty 5

## 2014-11-11 NOTE — Assessment & Plan Note (Signed)
Patient continues to complain of chronic diarrhea since receiving her last chemotherapy.  Patient has been alternating both Imodium and Lomotil with minimal effectiveness.  Stool for C. Difficile was negative. Pt will receive IV fluid rehydration.  Once again today.  She was also encouraged to push fluids at home as much as possible.

## 2014-11-11 NOTE — Progress Notes (Signed)
SYMPTOM MANAGEMENT CLINIC   HPI: [name] 74 y.o. female diagnosed with lung cancer; with bone metastasis.  Currently undergoing cisplatin/irinotecan chemotherapy regimen.  Patient last received chemotherapy on 11/01/2014.  Since that time.-Patient has been experiencing chronic diarrhea, nausea, and increased fatigue/weakness.  She feels fairly dehydrated today. She denies any fever or chills. .  Diarrhea     Review of Systems  Gastrointestinal: Positive for diarrhea.    Past Medical History  Diagnosis Date  . GERD (gastroesophageal reflux disease)   . IBS (irritable bowel syndrome)   . Adenomatous colon polyp 06/2009  . Hiatal hernia   . Breast cyst   . Diverticulosis   . Hemorrhoids   . Hyperlipidemia   . Hypertension   . Hyperparathyroidism   . Anxiety   . Migraine   . Osteoporosis   . Vitamin D deficiency   . Spinal stenosis   . C. difficile colitis 2006  . Small cell lung carcinoma dx'd 02/2014  . Bone metastases dx'd 02/2014    Past Surgical History  Procedure Laterality Date  . Cholecystectomy    . Vaginal hysterectomy    . Knee arthroscopy      left  . Parathyroidectomy  2009    [facility]  . Lumbar spine surgery  2001  . Breast biopsy      bilateral  . Total knee arthroplasty      right  . Bladder repair    . Video bronchoscopy Bilateral 03/14/2014    Procedure: VIDEO BRONCHOSCOPY WITHOUT FLUORO;  Surgeon: Tanda Rockers, MD;  Location: [facility];  Service: Cardiopulmonary;  Laterality: Bilateral;    has GERD; IRRITABLE BOWEL SYNDROME; CHEST PAIN; ABDOMINAL PAIN-EPIGASTRIC; NONSPEC ELEVATION OF LEVELS OF TRANSAMINASE/LDH; PERSONAL HX COLONIC POLYPS; Lung mass; Acute mid back pain secondary to lytic lesion T8  03/02/14; Small cell carcinoma of lung; Nausea with vomiting; Dehydration; Diarrhea; Hypokalemia; Thrombocytopenia; Neoplasm related pain; Tremor; Bone metastasis; Hyperbilirubinemia; Hyperphosphatemia; Anemia in neoplastic disease;  Hypocalcemia; Other pancytopenia; Pancytopenia; Neutropenia; Lung cancer; Acute pain; Protein-calorie malnutrition, severe; Weakness; Antineoplastic chemotherapy induced pancytopenia; and Hypoglycemia on her problem list.    is allergic to ace inhibitors; cephalexin; etodolac; and gabapentin.    Medication List       This list is accurate as of: 11/10/14 11:59 PM.  Always use your most recent med list.               ALPRAZolam 0.5 MG tablet  Commonly known as:  XANAX  Take 0.5 mg by mouth at bedtime.     atorvastatin 80 MG tablet  Commonly known as:  LIPITOR  Take 80 mg by mouth every morning.     buPROPion 150 MG 12 hr tablet  Commonly known as:  WELLBUTRIN SR  Take 150 mg by mouth every morning.     CALCIUM PO  Take 1 tablet by mouth 3 (three) times daily. Starting 06/21/14--taking 655m three times daily     carbidopa-levodopa 25-100 MG per tablet  Commonly known as:  SINEMET IR  Take 1 tablet by mouth 3 (three) times daily.     DEXILANT 60 MG capsule  Generic drug:  dexlansoprazole  TAKE ONE CAPSULE BY MOUTH DAILY     diphenoxylate-atropine 2.5-0.025 MG per tablet  Commonly known as:  LOMOTIL  Take 2 tablets by mouth 4 (four) times daily as needed for diarrhea or loose stools.     feeding supplement (ENSURE COMPLETE) Liqd  Take 237 mLs by mouth 2 (two) times  daily between meals.     glycopyrrolate 2 MG tablet  Commonly known as:  ROBINUL  TAKE 1 TABLET (2 MG TOTAL) BY MOUTH 2 (TWO) TIMES DAILY.     hydrochlorothiazide 12.5 MG tablet  Commonly known as:  HYDRODIURIL  Take 1 tablet by mouth every morning.     KLOR-CON M20 20 MEQ tablet  Generic drug:  potassium chloride SA  TAKE 1 TABLET BY MOUTH EVERY DAY     lidocaine-prilocaine cream  Commonly known as:  EMLA  Apply 1 application topically as needed. Apply to port 1 hr before chemo     LORazepam 0.5 MG tablet  Commonly known as:  ATIVAN  Take 1 tablet (0.5 mg total) by mouth every 8 (eight) hours.      metoprolol succinate 50 MG 24 hr tablet  Commonly known as:  TOPROL-XL  Take 50 mg by mouth every morning.     MIRALAX PO  Take 1 scoop by mouth daily as needed (for constipation).     ondansetron 8 MG tablet  Commonly known as:  ZOFRAN  Take 1 tablet (8 mg total) by mouth every 8 (eight) hours as needed for nausea or vomiting.     oxyCODONE-acetaminophen 5-325 MG per tablet  Commonly known as:  PERCOCET/ROXICET  Take 1 tablet by mouth every 6 (six) hours as needed for severe pain.     prochlorperazine 10 MG tablet  Commonly known as:  COMPAZINE  Take 1 tablet (10 mg total) by mouth every 6 (six) hours as needed for nausea or vomiting.     rOPINIRole 1 MG tablet  Commonly known as:  REQUIP  TAKE 1 TABLET (1 MG TOTAL) BY MOUTH 3 (THREE) TIMES DAILY.     ZORVOLEX 35 MG Caps  Generic drug:  Diclofenac  Take 35 mg by mouth every morning.         PHYSICAL EXAMINATION  Oncology Vitals 11/11/2014 11/11/2014 11/11/2014 11/11/2014 11/11/2014 11/10/2014 11/10/2014  Height - - - - - - -  Weight - - - - - - -  Weight (lbs) - - - - - - -  BMI (kg/m2) - - - - - - -  Temp 97.9 98.4 97.9 98.4 98.4 - 98.6  Pulse 72 80 83 79 74 85 86  Resp 18 20 18 18 18  - 16  SpO2 97 99 - 100 100 - 100  BSA (m2) - - - - - - -   BP Readings from Last 3 Encounters:  11/11/14 116/67  11/10/14 133/46  11/09/14 111/56    Physical Exam  Constitutional: She is oriented to person, place, and time.  Patient appears fatigued, weak, frail, and chronically ill.  HENT:  Head: Normocephalic and atraumatic.  Mouth/Throat: Oropharynx is clear and moist.  Eyes: Conjunctivae and EOM are normal. Pupils are equal, round, and reactive to light. Right eye exhibits no discharge. Left eye exhibits no discharge. No scleral icterus.  Neck: Normal range of motion. Neck supple. No JVD present. No tracheal deviation present. No thyromegaly present.  Cardiovascular: Normal rate, regular rhythm, normal heart sounds and intact  distal pulses.   Pulmonary/Chest: Effort normal and breath sounds normal. No respiratory distress. She has no wheezes. She has no rales. She exhibits no tenderness.  Occasional congested cough.  No acute respiratory distress.  Abdominal: Soft. Bowel sounds are normal. She exhibits no distension and no mass. There is no tenderness. There is no rebound and no guarding.  Musculoskeletal: Normal range of motion. She  exhibits no edema or tenderness.  Lymphadenopathy:    She has no cervical adenopathy.  Neurological: She is alert and oriented to person, place, and time.  Patient in a wheelchair during exam.  Skin: Skin is warm and dry. No rash noted. No erythema. There is pallor.  Psychiatric: Affect normal.  Nursing note and vitals reviewed.   LABORATORY DATA:. Hospital Outpatient Visit on 11/10/2014  Component Date Value Ref Range Status  . C difficile by pcr 11/10/2014 NEGATIVE  NEGATIVE Final  . Blood Bank Specimen 11/10/2014 SAMPLE AVAILABLE FOR TESTING   Final  . Sample Expiration 11/10/2014 11/13/2014   Final  Appointment on 11/10/2014  Component Date Value Ref Range Status  . WBC 11/10/2014 1.8* 3.9 - 10.3 10e3/uL Final  . NEUT# 11/10/2014 0.8* 1.5 - 6.5 10e3/uL Final  . HGB 11/10/2014 7.6* 11.6 - 15.9 g/dL Final  . HCT 11/10/2014 21.2* 34.8 - 46.6 % Final  . Platelets 11/10/2014 56* 145 - 400 10e3/uL Final  . MCV 11/10/2014 86.2  79.5 - 101.0 fL Final  . MCH 11/10/2014 30.9  25.1 - 34.0 pg Final  . MCHC 11/10/2014 35.8  31.5 - 36.0 g/dL Final  . RBC 11/10/2014 2.46* 3.70 - 5.45 10e6/uL Final  . RDW 11/10/2014 13.2  11.2 - 14.5 % Final  . lymph# 11/10/2014 0.7* 0.9 - 3.3 10e3/uL Final  . MONO# 11/10/2014 0.2  0.1 - 0.9 10e3/uL Final  . Eosinophils Absolute 11/10/2014 0.1  0.0 - 0.5 10e3/uL Final  . Basophils Absolute 11/10/2014 0.0  0.0 - 0.1 10e3/uL Final  . NEUT% 11/10/2014 46.9  38.4 - 76.8 % Final  . LYMPH% 11/10/2014 38.5  14.0 - 49.7 % Final  . MONO% 11/10/2014 9.5  0.0  - 14.0 % Final  . EOS% 11/10/2014 4.5  0.0 - 7.0 % Final  . BASO% 11/10/2014 0.6  0.0 - 2.0 % Final  . Sodium 11/10/2014 132* 136 - 145 mEq/L Final  . Potassium 11/10/2014 3.5  3.5 - 5.1 mEq/L Final  . Chloride 11/10/2014 96* 98 - 109 mEq/L Final  . CO2 11/10/2014 21* 22 - 29 mEq/L Final  . Glucose 11/10/2014 67* 70 - 140 mg/dl Final  . BUN 11/10/2014 18.7  7.0 - 26.0 mg/dL Final  . Creatinine 11/10/2014 0.8  0.6 - 1.1 mg/dL Final  . Total Bilirubin 11/10/2014 0.71  0.20 - 1.20 mg/dL Final  . Alkaline Phosphatase 11/10/2014 65  40 - 150 U/L Final  . AST 11/10/2014 13  5 - 34 U/L Final  . ALT 11/10/2014 8  0 - 55 U/L Final  . Total Protein 11/10/2014 6.3* 6.4 - 8.3 g/dL Final  . Albumin 11/10/2014 3.2* 3.5 - 5.0 g/dL Final  . Calcium 11/10/2014 5.4 Repeated and Verified* 8.4 - 10.4 mg/dL Final  . Anion Gap 11/10/2014 15* 3 - 11 mEq/L Final  . EGFR 11/10/2014 72* >90 ml/min/1.73 m2 Final   eGFR is calculated using the CKD-EPI Creatinine Equation (2009)     RADIOGRAPHIC STUDIES: No results found.  ASSESSMENT/PLAN:    Antineoplastic chemotherapy induced pancytopenia Patient last received her cisplatin/irinotecan chemotherapy on 11/01/2014.  All counts decreased with lab draw today.  WBC 1.8, ANC 0.8, hemoglobin 7.6, and platelet count 56.  Patient was once again reminded of all neutropenia precautions.  Patient is complaining of increased fatigue; denies any worsening shortness of breath with exertion.  Patient will return tomorrow to receive 2 units packed red blood cells transfusion.  Patient denies any worsening issues with either  easy bleeding or bruising today.  Patient was afebrile today.  Dehydration Patient continues to complain of chronic diarrhea since receiving her last chemotherapy.  Patient has been alternating both Imodium and Lomotil with minimal effectiveness.  Stool for C. Difficile was negative. Pt will receive IV fluid rehydration.  Once again today.  She was  also encouraged to push fluids at home as much as possible.  Diarrhea Patient continues to complain of chronic diarrhea since receiving her last chemotherapy.  Patient has been alternating both Imodium and Lomotil with minimal effectiveness.  Stool for C. Difficile was negative. Pt will receive IV fluid rehydration once again today.  She was also encouraged to push fluids at home as much as possible.   Hypocalcemia Calcium down to to 5.4 today.  Pt was given calcium gluconate IV. Most likely, secondary to diarrhea and dehydration. Will continue to monitor closely.   Hypoglycemia Blood sugar initially 67 on the results.  Patient states she has had minimal appetite, and poor oral intake due to diarrhea, and dehydration.  Patient states she did drink some Coke earlier today.  Patient was encouraged try a clear liquid diet if possible; and to make sure she tries adding sugar and salt to her diet.  Nausea with vomiting Patient continues to complain of some chronic nausea; but no vomiting.  She states she has been taking her antinausea medications at home with only moderate effectiveness.  Patient does appear dehydrated today and will receive IV fluid rehydration while at the cancer center.  She was also given Zofran  IV to help with her nausea.    Small cell carcinoma of lung Patient received her last cycle of cisplatin/irinotecan chemotherapy on 11/01/2014.  Since that time.-Patient has been experiencing increased diarrhea, nausea, dehydration, and progressive fatigue.  WBC 2.9, ANC 2.3, hemoglobin 8.9, and platelet count 96.  Patient is scheduled to return on 11/15/2014 for labs, follow up visit, and her next cycle of chemotherapy.  If patient continues with diarrhea-may very well need to consider decreasing the dose of her next chemotherapy.      Weakness Patient is complaining of progressive weakness.  Most likely secondary to her chronic diarrhea, nausea, and subsequent dehydration.   Patient will receive IV fluid rehydration today.  She was also encouraged to remain as active as possible.     Patient stated understanding of all instructions; and was in agreement with this plan of care. The patient knows to call the clinic with any problems, questions or concerns.   All details of visit reviewed with Dr. Julien Nordmann.   Total time spent with patient was 40 minutes;  with greater than 75 percent of that time spent in face to face counseling regarding patient's symptoms,  and coordination of care and follow up.  Disclaimer: This note was dictated with voice recognition software. Similar sounding words can inadvertently be transcribed and may not be corrected upon review.   Drue Second, NP 11/11/2014   Disclaimer: This note was dictated with voice recognition software. Similar sounding words can inadvertently be transcribed and may be missed upon review. Drue Second, NP 11/11/2014

## 2014-11-11 NOTE — Assessment & Plan Note (Signed)
Patient received her last cycle of cisplatin/irinotecan chemotherapy on 11/01/2014.  Since that time.-Patient has been experiencing increased diarrhea, nausea, dehydration, and progressive fatigue.  WBC 2.9, ANC 2.3, hemoglobin 8.9, and platelet count 96.  Patient is scheduled to return on 11/15/2014 for labs, follow up visit, and her next cycle of chemotherapy.  If patient continues with diarrhea-may very well need to consider decreasing the dose of her next chemotherapy.

## 2014-11-11 NOTE — Assessment & Plan Note (Signed)
Patient continues to complain of chronic diarrhea since receiving her last chemotherapy.  Patient has been alternating both Imodium and Lomotil with minimal effectiveness.  Stool for C. Difficile was negative. Pt will receive IV fluid rehydration once again today.  She was also encouraged to push fluids at home as much as possible.

## 2014-11-11 NOTE — Assessment & Plan Note (Addendum)
Patient last received her cisplatin/irinotecan chemotherapy on 11/01/2014.  All counts decreased with lab draw today.  WBC 1.8, ANC 0.8, hemoglobin 7.6, and platelet count 56.  Patient was once again reminded of all neutropenia precautions.  Patient is complaining of increased fatigue; denies any worsening shortness of breath with exertion.  Patient will return tomorrow to receive 2 units packed red blood cells transfusion.  Patient denies any worsening issues with either easy bleeding or bruising today.  Patient was afebrile today.

## 2014-11-11 NOTE — Assessment & Plan Note (Signed)
Calcium down to to 5.4 today.  Pt was given calcium gluconate IV. Most likely, secondary to diarrhea and dehydration. Will continue to monitor closely.

## 2014-11-11 NOTE — Patient Instructions (Signed)

## 2014-11-11 NOTE — Assessment & Plan Note (Signed)
Blood sugar initially 67 on the results.  Patient states she has had minimal appetite, and poor oral intake due to diarrhea, and dehydration.  Patient states she did drink some Coke earlier today.  Patient was encouraged try a clear liquid diet if possible; and to make sure she tries adding sugar and salt to her diet.

## 2014-11-11 NOTE — Progress Notes (Signed)
Quick Note:  Call patient with the result and encourage her to take OTC calcium 3 times/day. ______

## 2014-11-11 NOTE — Assessment & Plan Note (Signed)
Patient is complaining of progressive weakness.  Most likely secondary to her chronic diarrhea, nausea, and subsequent dehydration.  Patient will receive IV fluid rehydration today.  She was also encouraged to remain as active as possible.

## 2014-11-11 NOTE — Assessment & Plan Note (Signed)
Patient continues to complain of some chronic nausea; but no vomiting.  She states she has been taking her antinausea medications at home with only moderate effectiveness.  Patient does appear dehydrated today and will receive IV fluid rehydration while at the cancer center.  She was also given Zofran  IV to help with her nausea.

## 2014-11-12 LAB — TYPE AND SCREEN
ABO/RH(D): O POS
Antibody Screen: NEGATIVE
Unit division: 0
Unit division: 0

## 2014-11-13 ENCOUNTER — Other Ambulatory Visit (HOSPITAL_BASED_OUTPATIENT_CLINIC_OR_DEPARTMENT_OTHER): Payer: Medicare Other

## 2014-11-13 ENCOUNTER — Other Ambulatory Visit: Payer: Self-pay

## 2014-11-13 ENCOUNTER — Ambulatory Visit (HOSPITAL_BASED_OUTPATIENT_CLINIC_OR_DEPARTMENT_OTHER): Payer: Medicare Other

## 2014-11-13 ENCOUNTER — Ambulatory Visit: Payer: Medicare Other

## 2014-11-13 ENCOUNTER — Other Ambulatory Visit: Payer: Self-pay | Admitting: Medical Oncology

## 2014-11-13 ENCOUNTER — Telehealth: Payer: Self-pay | Admitting: Medical Oncology

## 2014-11-13 VITALS — BP 108/69 | HR 69 | Temp 98.6°F | Resp 20

## 2014-11-13 DIAGNOSIS — C7951 Secondary malignant neoplasm of bone: Secondary | ICD-10-CM

## 2014-11-13 DIAGNOSIS — C3491 Malignant neoplasm of unspecified part of right bronchus or lung: Secondary | ICD-10-CM | POA: Diagnosis not present

## 2014-11-13 DIAGNOSIS — Z95828 Presence of other vascular implants and grafts: Secondary | ICD-10-CM

## 2014-11-13 DIAGNOSIS — E876 Hypokalemia: Secondary | ICD-10-CM

## 2014-11-13 DIAGNOSIS — C3490 Malignant neoplasm of unspecified part of unspecified bronchus or lung: Secondary | ICD-10-CM

## 2014-11-13 LAB — COMPREHENSIVE METABOLIC PANEL (CC13)
ALK PHOS: 70 U/L (ref 40–150)
ALT: 9 U/L (ref 0–55)
AST: 13 U/L (ref 5–34)
Albumin: 3.3 g/dL — ABNORMAL LOW (ref 3.5–5.0)
Anion Gap: 13 mEq/L — ABNORMAL HIGH (ref 3–11)
BILIRUBIN TOTAL: 1.04 mg/dL (ref 0.20–1.20)
BUN: 5 mg/dL — AB (ref 7.0–26.0)
CO2: 34 mEq/L — ABNORMAL HIGH (ref 22–29)
CREATININE: 0.8 mg/dL (ref 0.6–1.1)
Calcium: 5.6 mg/dL — CL (ref 8.4–10.4)
Chloride: 91 mEq/L — ABNORMAL LOW (ref 98–109)
EGFR: 73 mL/min/{1.73_m2} — ABNORMAL LOW (ref >90)
Glucose: 101 mg/dl (ref 70–140)
Potassium: 2.9 mEq/L — CL (ref 3.5–5.1)
Sodium: 137 mEq/L (ref 136–145)
Total Protein: 6.5 g/dL (ref 6.4–8.3)

## 2014-11-13 MED ORDER — HEPARIN SOD (PORK) LOCK FLUSH 100 UNIT/ML IV SOLN
500.0000 [IU] | INTRAVENOUS | Status: AC | PRN
  Administered 2014-11-13: 500 [IU]
  Filled 2014-11-13: qty 5

## 2014-11-13 MED ORDER — SODIUM CHLORIDE 0.9 % IJ SOLN
10.0000 mL | INTRAMUSCULAR | Status: DC | PRN
  Administered 2014-11-13: 10 mL via INTRAVENOUS
  Filled 2014-11-13: qty 10

## 2014-11-13 MED ORDER — SODIUM CHLORIDE 0.9 % IV SOLN: INTRAVENOUS | Status: DC

## 2014-11-13 MED ORDER — POTASSIUM CHLORIDE CRYS ER 20 MEQ PO TBCR: 20.0000 meq | EXTENDED_RELEASE_TABLET | Freq: Two times a day (BID) | ORAL | Status: DC

## 2014-11-13 MED ORDER — SODIUM CHLORIDE 0.9 % IV SOLN
Freq: Once | INTRAVENOUS | Status: AC
  Administered 2014-11-13: 13:00:00 via INTRAVENOUS

## 2014-11-13 MED ORDER — SODIUM CHLORIDE 0.9 % IV SOLN
2.0000 g | Freq: Once | INTRAVENOUS | Status: AC
  Administered 2014-11-13: 2 g via INTRAVENOUS
  Filled 2014-11-13: qty 20

## 2014-11-13 MED ORDER — SODIUM CHLORIDE 0.9 % IJ SOLN
10.0000 mL | INTRAMUSCULAR | Status: AC | PRN
  Administered 2014-11-13: 10 mL
  Filled 2014-11-13: qty 10

## 2014-11-13 NOTE — Patient Instructions (Signed)

## 2014-11-13 NOTE — Progress Notes (Signed)
Pt stated she is not taking any oral potassium and she is taking oral calcium ' but not like i should" , I told her to pick up kdur rx and to take her calcium as directed. IV calcium scheduled for now.

## 2014-11-13 NOTE — Patient Instructions (Signed)
  Hypocalcemia, Adult Hypocalcemia is low blood calcium. Calcium is important for cells to function in the body. Low blood calcium can cause a variety of symptoms and problems. CAUSES   Low levels of a body protein called albumin.  Problems with the parathyroid glands or surgical removal of the parathyroid glands. The parathyroid glands maintain the body's level of calcium.  Decreased production or improper use of parathyroid hormone.  Lack (deficiency) of vitamin D or magnesium or both.  Intestinal problems that interfere with nutrient absorption.  Alcoholism.  Kidney problems.  Inflammation of the pancreas (pancreatitis).  Certain medicines.  Severe infections (sepsis).  Infiltrative diseases. With these diseases the parathyroid glands are filled with cells or substances that are not normally present. Examples include:  Sarcoidosis.  Hemachromatosis.  Breakdown of large amounts of muscle fiber.  High levels of phosphate in the body.  Cancer.  Massive blood transfusions which usually occur with severe trauma. SYMPTOMS   Numbness and tingling in the fingers, toes, or around the mouth.  Muscle aches or cramps, especially in the legs, feet, and back.  Muscle twitches.  Shortness of breath or wheezing.  Difficulty swallowing.  Changes in the sound of the voice.  General weakness.  Fainting.  Fast heart beats (palpitations).  Chest pain.  Irritability.  Difficulty thinking.  Memory problems or confusion.  Severe fatigue.  Changes in personality.  Depression and anxiety.  Shaking uncontrollably (seizures).  Coarse, brittle hair and nails.  Dry skin or lasting (chronic) skin diseases (psoriasis, eczema, or dermatitis).  Clouding of the eye lens (cataracts).  Abdominal cramping or pain. DIAGNOSIS  Hypocalcemia is usually diagnosed through blood tests that reveal a low level of blood calcium. Other tests, such as a recording of the electrical  activity of the heart (electrocardiogram, EKG), may be performed in order to diagnose the underlying cause of the condition. TREATMENT  Treatment for hypocalcemia includes giving calcium supplements. These can be given by mouth or by intravenous (IV) access tube, depending on the severity of the symptoms and deficiency. Other minerals (electrolytes), such as magnesium, may also be given. HOME CARE INSTRUCTIONS   Meet with a dietitian to make sure you are eating the most healthful diet possible, or follow diet instructions as directed by your caregiver.  Follow up with your caregiver as directed. SEEK IMMEDIATE MEDICAL CARE IF:   You develop chest pain.  You develop persistent rapid or irregular heartbeats.  You have difficulty breathing.  You faint.  You develop increased fatigue.  You have new swelling in the feet, ankles, or legs.  You develop increased muscle twitching.  You start to have seizures.  You develop confusion.  You develop mood, memory, or personality changes. MAKE SURE YOU:   Understand these instructions.  Will watch your condition.  Will get help right away if you are not doing well or get worse. Document Released: 10/23/2009 Document Revised: 07/28/2011 Document Reviewed: 10/23/2009 [employer] Patient Information 2015 [place], [place]. This information is not intended to replace advice given to you by your health care provider. Make sure you discuss any questions you have with your health care provider.

## 2014-11-13 NOTE — Telephone Encounter (Signed)
-----   Message from Curt Bears, MD sent at 11/11/2014 10:09 AM EDT ----- Call patient with the result and encourage her to take OTC calcium 3 times/day.

## 2014-11-13 NOTE — Telephone Encounter (Signed)
I spoke to pt in lobby and told her to take her calcium and she also received calcium gluconate via IV today

## 2014-11-13 NOTE — Telephone Encounter (Signed)
Getting iv calcium gluconate

## 2014-11-15 ENCOUNTER — Telehealth: Payer: Self-pay | Admitting: *Deleted

## 2014-11-15 ENCOUNTER — Other Ambulatory Visit: Payer: Medicare Other

## 2014-11-15 ENCOUNTER — Ambulatory Visit: Payer: Medicare Other

## 2014-11-15 ENCOUNTER — Other Ambulatory Visit (HOSPITAL_BASED_OUTPATIENT_CLINIC_OR_DEPARTMENT_OTHER): Payer: Medicare Other

## 2014-11-15 ENCOUNTER — Telehealth: Payer: Self-pay | Admitting: Physician Assistant

## 2014-11-15 ENCOUNTER — Ambulatory Visit (HOSPITAL_BASED_OUTPATIENT_CLINIC_OR_DEPARTMENT_OTHER): Payer: Medicare Other | Admitting: Physician Assistant

## 2014-11-15 VITALS — BP 109/66 | HR 90 | Temp 98.1°F | Resp 18 | Ht 65.0 in | Wt 118.1 lb

## 2014-11-15 DIAGNOSIS — D701 Agranulocytosis secondary to cancer chemotherapy: Secondary | ICD-10-CM | POA: Diagnosis not present

## 2014-11-15 DIAGNOSIS — C3491 Malignant neoplasm of unspecified part of right bronchus or lung: Secondary | ICD-10-CM

## 2014-11-15 DIAGNOSIS — T451X5A Adverse effect of antineoplastic and immunosuppressive drugs, initial encounter: Secondary | ICD-10-CM

## 2014-11-15 DIAGNOSIS — R197 Diarrhea, unspecified: Secondary | ICD-10-CM | POA: Diagnosis not present

## 2014-11-15 DIAGNOSIS — M542 Cervicalgia: Secondary | ICD-10-CM

## 2014-11-15 DIAGNOSIS — M549 Dorsalgia, unspecified: Secondary | ICD-10-CM

## 2014-11-15 DIAGNOSIS — Z95828 Presence of other vascular implants and grafts: Secondary | ICD-10-CM

## 2014-11-15 DIAGNOSIS — C3490 Malignant neoplasm of unspecified part of unspecified bronchus or lung: Secondary | ICD-10-CM

## 2014-11-15 DIAGNOSIS — D61810 Antineoplastic chemotherapy induced pancytopenia: Secondary | ICD-10-CM

## 2014-11-15 DIAGNOSIS — C7951 Secondary malignant neoplasm of bone: Secondary | ICD-10-CM

## 2014-11-15 LAB — CBC WITH DIFFERENTIAL/PLATELET
BASO%: 0 % (ref 0.0–2.0)
Basophils Absolute: 0 10*3/uL (ref 0.0–0.1)
EOS%: 3.2 % (ref 0.0–7.0)
Eosinophils Absolute: 0.1 10*3/uL (ref 0.0–0.5)
HCT: 31.1 % — ABNORMAL LOW (ref 34.8–46.6)
HGB: 10.7 g/dL — ABNORMAL LOW (ref 11.6–15.9)
LYMPH%: 40 % (ref 14.0–49.7)
MCH: 30.4 pg (ref 25.1–34.0)
MCHC: 34.4 g/dL (ref 31.5–36.0)
MCV: 88.4 fL (ref 79.5–101.0)
MONO#: 0.3 10*3/uL (ref 0.1–0.9)
MONO%: 20.6 % — ABNORMAL HIGH (ref 0.0–14.0)
NEUT#: 0.6 10*3/uL — ABNORMAL LOW (ref 1.5–6.5)
NEUT%: 36.2 % — AB (ref 38.4–76.8)
Platelets: 36 10*3/uL — ABNORMAL LOW (ref 145–400)
RBC: 3.52 10*6/uL — ABNORMAL LOW (ref 3.70–5.45)
RDW: 13.5 % (ref 11.2–14.5)
WBC: 1.6 10*3/uL — ABNORMAL LOW (ref 3.9–10.3)
lymph#: 0.6 10*3/uL — ABNORMAL LOW (ref 0.9–3.3)

## 2014-11-15 LAB — COMPREHENSIVE METABOLIC PANEL (CC13)
ALT: <6 U/L (ref 0–55)
AST: 11 U/L (ref 5–34)
Albumin: 3.3 g/dL — ABNORMAL LOW (ref 3.5–5.0)
Alkaline Phosphatase: 74 U/L (ref 40–150)
Anion Gap: 16 mEq/L — ABNORMAL HIGH (ref 3–11)
BUN: 12.4 mg/dL (ref 7.0–26.0)
CALCIUM: 6.2 mg/dL — AB (ref 8.4–10.4)
CHLORIDE: 93 meq/L — AB (ref 98–109)
CO2: 31 mEq/L — ABNORMAL HIGH (ref 22–29)
Creatinine: 1 mg/dL (ref 0.6–1.1)
EGFR: 58 mL/min/{1.73_m2} — ABNORMAL LOW (ref >90)
Glucose: 108 mg/dl (ref 70–140)
Potassium: 3.1 mEq/L — ABNORMAL LOW (ref 3.5–5.1)
Sodium: 140 mEq/L (ref 136–145)
Total Bilirubin: 1.1 mg/dL (ref 0.20–1.20)
Total Protein: 6.7 g/dL (ref 6.4–8.3)

## 2014-11-15 LAB — HOLD TUBE, BLOOD BANK

## 2014-11-15 MED ORDER — HEPARIN SOD (PORK) LOCK FLUSH 100 UNIT/ML IV SOLN
500.0000 [IU] | Freq: Once | INTRAVENOUS | Status: AC
  Administered 2014-11-15: 500 [IU] via INTRAVENOUS
  Filled 2014-11-15: qty 5

## 2014-11-15 MED ORDER — SODIUM CHLORIDE 0.9 % IJ SOLN
10.0000 mL | INTRAMUSCULAR | Status: DC | PRN
  Administered 2014-11-15: 10 mL via INTRAVENOUS
  Filled 2014-11-15: qty 10

## 2014-11-15 MED ORDER — TBO-FILGRASTIM 300 MCG/0.5ML ~~LOC~~ SOSY
300.0000 ug | PREFILLED_SYRINGE | Freq: Once | SUBCUTANEOUS | Status: AC
  Administered 2014-11-15: 300 ug via SUBCUTANEOUS
  Filled 2014-11-15: qty 0.5

## 2014-11-15 NOTE — Patient Instructions (Signed)

## 2014-11-15 NOTE — Telephone Encounter (Signed)
Pt confirmed labs/ov per 06/29 POF, gave pt AVS and Calendar... KJ, sent msg to r/s chemo

## 2014-11-15 NOTE — Telephone Encounter (Signed)
Per staff message and POF I have scheduled appts. Advised scheduler of appts. JMW  

## 2014-11-15 NOTE — Patient Instructions (Addendum)

## 2014-11-16 ENCOUNTER — Other Ambulatory Visit: Payer: Self-pay | Admitting: Nurse Practitioner

## 2014-11-16 ENCOUNTER — Ambulatory Visit (HOSPITAL_BASED_OUTPATIENT_CLINIC_OR_DEPARTMENT_OTHER): Payer: Medicare Other

## 2014-11-16 VITALS — BP 102/62 | HR 86 | Temp 98.4°F

## 2014-11-16 DIAGNOSIS — D701 Agranulocytosis secondary to cancer chemotherapy: Secondary | ICD-10-CM

## 2014-11-16 DIAGNOSIS — D61810 Antineoplastic chemotherapy induced pancytopenia: Secondary | ICD-10-CM | POA: Diagnosis not present

## 2014-11-16 DIAGNOSIS — T451X5A Adverse effect of antineoplastic and immunosuppressive drugs, initial encounter: Principal | ICD-10-CM

## 2014-11-16 DIAGNOSIS — C3491 Malignant neoplasm of unspecified part of right bronchus or lung: Secondary | ICD-10-CM | POA: Diagnosis not present

## 2014-11-16 MED ORDER — TBO-FILGRASTIM 300 MCG/0.5ML ~~LOC~~ SOSY
300.0000 ug | PREFILLED_SYRINGE | Freq: Once | SUBCUTANEOUS | Status: AC
  Administered 2014-11-16: 300 ug via SUBCUTANEOUS
  Filled 2014-11-16: qty 0.5

## 2014-11-20 NOTE — Progress Notes (Addendum)
[name] [phone]   [phone]  OFFICE PROGRESS NOTE  [name], MD [address] [place] [address]  DIAGNOSIS: Extensive stage, stage IV (T3, N2, M1b) small cell lung cancer diagnosed in October 2015.  PRIOR THERAPY: Systemic chemotherapy with carboplatin for AUC of 5 on day 1 and etoposide 100 MG/M2 on days 1, 2 and 3 with Neulasta support on day 4. Status post 6 cycles. Starting from cycle #5 carboplatin with for AUC of 4 and etoposide 80 MG/M2  CURRENT THERAPY: The patient restarted systemic chemotherapy with cisplatin 25 mg meter squared and irinotecan 65 mg meter squared given day 1 and day 8 of each cycle. She started this on 10/25/2014. Status post 1 cycle.  INTERVAL HISTORY: [name] 74 y.o. female returns to the clinic today for follow-up visit accompanied by her husband. The patient is status post one cycle of systemic chemotherapy with cisplatin and irinotecan. She is tolerating her chemotherapy relatively well the exception of diarrhea. She reports decreased intake of food. She denies any bruising or bleeding although has had some brief nosebleed involving the left nostril on Monday and Tuesday but no further episodes. She reports that Imodium is effective in controlling the diarrhea.  She continues to have back pain and uses oxycodone. She would like a refill of this today. She denied having fever or chills. The patient denied having any significant chest pain but continues to have shortness breath with exertion.   MEDICAL HISTORY: Past Medical History  Diagnosis Date  . GERD (gastroesophageal reflux disease)   . IBS (irritable bowel syndrome)   . Adenomatous colon polyp 06/2009  . Hiatal hernia   . Breast cyst   . Diverticulosis   . Hemorrhoids   . Hyperlipidemia   . Hypertension   . Hyperparathyroidism   . Anxiety   . Migraine   . Osteoporosis   . Vitamin D deficiency   . Spinal stenosis   . C. difficile  colitis 2006  . Small cell lung carcinoma dx'd 02/2014  . Bone metastases dx'd 02/2014    ALLERGIES:  is allergic to ace inhibitors; cephalexin; etodolac; and gabapentin.  MEDICATIONS:  Current Outpatient Prescriptions  Medication Sig Dispense Refill  . ALPRAZolam (XANAX) 0.5 MG tablet Take 0.5 mg by mouth at bedtime.      Marland Kitchen atorvastatin (LIPITOR) 80 MG tablet Take 80 mg by mouth every morning.     Marland Kitchen buPROPion (WELLBUTRIN SR) 150 MG 12 hr tablet Take 150 mg by mouth every morning.     Marland Kitchen CALCIUM PO Take 1 tablet by mouth 3 (three) times daily. Starting 06/21/14--taking '600mg'$  three times daily    . carbidopa-levodopa (SINEMET IR) 25-100 MG per tablet Take 1 tablet by mouth 3 (three) times daily. 270 tablet 1  . DEXILANT 60 MG capsule TAKE ONE CAPSULE BY MOUTH DAILY 30 capsule 0  . Diclofenac (ZORVOLEX) 35 MG CAPS Take 35 mg by mouth every morning.     . diphenoxylate-atropine (LOMOTIL) 2.5-0.025 MG per tablet Take 2 tablets by mouth 4 (four) times daily as needed for diarrhea or loose stools. 30 tablet 0  . feeding supplement, ENSURE COMPLETE, (ENSURE COMPLETE) LIQD Take 237 mLs by mouth 2 (two) times daily between meals. 237 mL 0  . glycopyrrolate (ROBINUL) 2 MG tablet TAKE 1 TABLET (2 MG TOTAL) BY MOUTH 2 (TWO) TIMES DAILY. 60 tablet 0  . hydrochlorothiazide (HYDRODIURIL) 12.5 MG tablet Take 1 tablet by mouth every morning.     Marland Kitchen  KLOR-CON M20 20 MEQ tablet TAKE 1 TABLET BY MOUTH EVERY DAY 30 tablet 1  . lidocaine-prilocaine (EMLA) cream Apply 1 application topically as needed. Apply to port 1 hr before chemo 30 g 0  . LORazepam (ATIVAN) 0.5 MG tablet Take 1 tablet (0.5 mg total) by mouth every 8 (eight) hours. 30 tablet 0  . metoprolol (TOPROL-XL) 50 MG 24 hr tablet Take 50 mg by mouth every morning.     . ondansetron (ZOFRAN) 8 MG tablet Take 1 tablet (8 mg total) by mouth every 8 (eight) hours as needed for nausea or vomiting. 20 tablet 0  . oxyCODONE-acetaminophen (PERCOCET/ROXICET) 5-325  MG per tablet Take 1 tablet by mouth every 6 (six) hours as needed for severe pain. 30 tablet 0  . Polyethylene Glycol 3350 (MIRALAX PO) Take 1 scoop by mouth daily as needed (for constipation).     . potassium chloride SA (K-DUR,KLOR-CON) 20 MEQ tablet Take 1 tablet (20 mEq total) by mouth 2 (two) times daily. 20 tablet 0  . prochlorperazine (COMPAZINE) 10 MG tablet Take 1 tablet (10 mg total) by mouth every 6 (six) hours as needed for nausea or vomiting. 30 tablet 1  . rOPINIRole (REQUIP) 1 MG tablet TAKE 1 TABLET (1 MG TOTAL) BY MOUTH 3 (THREE) TIMES DAILY. 90 tablet 1   No current facility-administered medications for this visit.    SURGICAL HISTORY:  Past Surgical History  Procedure Laterality Date  . Cholecystectomy    . Vaginal hysterectomy    . Knee arthroscopy      left  . Parathyroidectomy  2009    [facility]  . Lumbar spine surgery  2001  . Breast biopsy      bilateral  . Total knee arthroplasty      right  . Bladder repair    . Video bronchoscopy Bilateral 03/14/2014    Procedure: VIDEO BRONCHOSCOPY WITHOUT FLUORO;  Surgeon: Tanda Rockers, MD;  Location: [facility];  Service: Cardiopulmonary;  Laterality: Bilateral;    REVIEW OF SYSTEMS:  Constitutional: positive for anorexia and fatigue Eyes: negative Ears, nose, mouth, throat, and face: positive for epistaxis and brief epissodes involving left nostril, now resolved Respiratory: positive for dyspnea on exertion Cardiovascular: negative Gastrointestinal: positive for diarrhea Genitourinary:negative Integument/breast: negative Hematologic/lymphatic: negative Musculoskeletal:positive for back pain Neurological: positive for tremors Behavioral/Psych: negative Endocrine: negative Allergic/Immunologic: negative   PHYSICAL EXAMINATION: General appearance: alert, cooperative, fatigued and no distress Head: Normocephalic, without obvious abnormality, atraumatic Neck: no adenopathy, no JVD, supple, symmetrical,  trachea midline and thyroid not enlarged, symmetric, no tenderness/mass/nodules Lymph nodes: Cervical, supraclavicular, and axillary nodes normal. Resp: clear to auscultation bilaterally Back: symmetric, no curvature. ROM normal. No CVA tenderness. Cardio: regular rate and rhythm, S1, S2 normal, no murmur, click, rub or gallop GI: soft, non-tender; bowel sounds normal; no masses,  no organomegaly Extremities: extremities normal, atraumatic, no cyanosis or edema Neurologic: Alert and oriented X 3, normal strength and tone. Normal symmetric reflexes. Normal coordination and gait  ECOG PERFORMANCE STATUS: 2 - Symptomatic, <50% confined to bed  Blood pressure 109/66, pulse 90, temperature 98.1 F (36.7 C), temperature source Oral, resp. rate 18, height '5\' 5"'$  (1.651 m), weight 118 lb 1.6 oz (53.57 kg), SpO2 99 %.  LABORATORY DATA: Lab Results  Component Value Date   WBC 1.6* 11/15/2014   HGB 10.7* 11/15/2014   HCT 31.1* 11/15/2014   MCV 88.4 11/15/2014   PLT 36* 11/15/2014      Chemistry  Component Value Date/Time   NA 140 11/15/2014 0841   NA 136 05/17/2014 0455   K 3.1* 11/15/2014 0841   K 3.4* 05/17/2014 0455   CL 95* 05/17/2014 0455   CO2 31* 11/15/2014 0841   CO2 32 05/17/2014 0455   BUN 12.4 11/15/2014 0841   BUN 6 05/17/2014 0455   CREATININE 1.0 11/15/2014 0841   CREATININE 0.78 05/17/2014 0455      Component Value Date/Time   CALCIUM 6.2* 11/15/2014 0841   CALCIUM 7.7* 05/17/2014 0455   ALKPHOS 74 11/15/2014 0841   ALKPHOS 232* 05/13/2014 1605   AST 11 11/15/2014 0841   AST 15 05/13/2014 1605   ALT <6 11/15/2014 0841   ALT 10 05/13/2014 1605   BILITOT 1.10 11/15/2014 0841   BILITOT 1.1 05/13/2014 1605       RADIOGRAPHIC STUDIES: No results found.  ASSESSMENT AND PLAN: This is a very pleasant 74 year old white female with:  1) Extensive stage small cell lung cancer currently undergoing systemic chemotherapy with carboplatin and etoposide status post 6  cycles. She had good response to the first line treatment and she had been on observation for the last 2 months. The recent CT scan of the chest, abdomen and pelvis showed interval progression of the metastatic disease with bulky mediastinal lymph nodes as well as multiple new pulmonary nodules and new paraspinal soft tissue masses as well as extensive bone metastasis. The patient is now on cisplatin 25 MG/M2 and irinotecan 65 MG/M2 on days 1 and 8 every 3 weeks. Status post 1 cycle.  The patient was seen and examined with Dr. Julien Nordmann. She is neutropenic and stump cytopenia today with a total white count of 1.6, an ANC of 0.6 and platelet count 36,000. Recommend that she postpone the start of cycle #2 by one week.  She will be given Granix 300 g today and tomorrow to address her neutropenia. Neutropenic precautions were reviewed with patient and her husband, both voiced understanding. She may continue to use Imodium as previously discussed to control her diarrhea.  2) metastatic bone disease: The patient will continue on Xgeva 120 mcg subcutaneously every 4 weeks. The patient has no dental issues and currently has dentures. She was also advised to take calcium and vitamin D supplements on daily basis.  3) chemotherapy-induced anemia, stable. We'll continue to monitor for now  4) For the tremors,  She is also followed by neurology. She will continue on Sinemet IR.  5) right neck and back pain: She is on Percocet 5/325 mg by mouth every 6 hours as needed.  .  The patient would come back for follow-up visit in 2 weeks for reevaluation.  She will continue to have weekly labs.  The patient was advised to call immediately if she has any concerning symptoms in the interval. The patient voices understanding of current disease status and treatment options and is in agreement with the current care plan.  All questions were answered. The patient knows to call the clinic with any problems, questions or  concerns. We can certainly see the patient much sooner if necessary.   Disclaimer: This note was dictated with voice recognition software. Similar sounding words can inadvertently be transcribed and may not be corrected upon review.   Carlton Adam PA-C   ADDENDUM: Hematology/Oncology Attending:  I had a face to face encounter with the patient. I recommended his care plan. This is a very pleasant 74 years old white female with extensive stage small cell  lung cancer who is currently undergoing second line treatment with reduced dose cisplatin and irinotecan status post 1 cycle. The patient tolerated the first cycle well except for increasing fatigue as well as diarrhea. She was supposed to start cycle #2 today but her absolute neutrophil count and platelets counts are low. We will delay the start of cycle #2 by 1 week until recovery of her neutrophil count as well as platelets count. For the chemotherapy-induced neutropenia, we will start the patient on Granix 300 mcg subcutaneously for 2 days. She will come back for follow-up visit in 2 weeks for reevaluation and management of any adverse effect of her treatment. She was advised to call immediately if she has any concerning symptoms in the interval.   Disclaimer: This note was dictated with voice recognition software. Similar sounding words can inadvertently be transcribed and may be missed upon review. Eilleen Kempf., MD 11/21/2014

## 2014-11-22 ENCOUNTER — Ambulatory Visit (HOSPITAL_BASED_OUTPATIENT_CLINIC_OR_DEPARTMENT_OTHER): Payer: Medicare Other

## 2014-11-22 ENCOUNTER — Ambulatory Visit: Payer: Medicare Other

## 2014-11-22 ENCOUNTER — Other Ambulatory Visit: Payer: Self-pay | Admitting: *Deleted

## 2014-11-22 ENCOUNTER — Other Ambulatory Visit (HOSPITAL_BASED_OUTPATIENT_CLINIC_OR_DEPARTMENT_OTHER): Payer: Medicare Other

## 2014-11-22 ENCOUNTER — Other Ambulatory Visit: Payer: Medicare Other

## 2014-11-22 ENCOUNTER — Other Ambulatory Visit: Payer: Self-pay | Admitting: Hematology and Oncology

## 2014-11-22 DIAGNOSIS — C3491 Malignant neoplasm of unspecified part of right bronchus or lung: Secondary | ICD-10-CM | POA: Diagnosis not present

## 2014-11-22 DIAGNOSIS — C3490 Malignant neoplasm of unspecified part of unspecified bronchus or lung: Secondary | ICD-10-CM

## 2014-11-22 DIAGNOSIS — Z95828 Presence of other vascular implants and grafts: Secondary | ICD-10-CM

## 2014-11-22 LAB — COMPREHENSIVE METABOLIC PANEL (CC13)
ALT: 8 U/L (ref 0–55)
ANION GAP: 15 meq/L — AB (ref 3–11)
AST: 16 U/L (ref 5–34)
Albumin: 3.3 g/dL — ABNORMAL LOW (ref 3.5–5.0)
Alkaline Phosphatase: 69 U/L (ref 40–150)
BILIRUBIN TOTAL: 1.11 mg/dL (ref 0.20–1.20)
BUN: 16.7 mg/dL (ref 7.0–26.0)
CO2: 34 meq/L — AB (ref 22–29)
Calcium: 5.4 mg/dL — CL (ref 8.4–10.4)
Chloride: 92 mEq/L — ABNORMAL LOW (ref 98–109)
Creatinine: 1 mg/dL (ref 0.6–1.1)
EGFR: 57 mL/min/{1.73_m2} — AB (ref >90)
GLUCOSE: 105 mg/dL (ref 70–140)
Potassium: 2.9 mEq/L — CL (ref 3.5–5.1)
Sodium: 140 mEq/L (ref 136–145)
TOTAL PROTEIN: 6.6 g/dL (ref 6.4–8.3)

## 2014-11-22 LAB — CBC WITH DIFFERENTIAL/PLATELET
BASO%: 1 % (ref 0.0–2.0)
Basophils Absolute: 0 10*3/uL (ref 0.0–0.1)
EOS ABS: 0 10*3/uL (ref 0.0–0.5)
EOS%: 0.2 % (ref 0.0–7.0)
HCT: 29.4 % — ABNORMAL LOW (ref 34.8–46.6)
HEMOGLOBIN: 10.2 g/dL — AB (ref 11.6–15.9)
LYMPH%: 20.1 % (ref 14.0–49.7)
MCH: 29.9 pg (ref 25.1–34.0)
MCHC: 34.5 g/dL (ref 31.5–36.0)
MCV: 86.7 fL (ref 79.5–101.0)
MONO#: 0.5 10*3/uL (ref 0.1–0.9)
MONO%: 15.7 % — ABNORMAL HIGH (ref 0.0–14.0)
NEUT#: 2.2 10*3/uL (ref 1.5–6.5)
NEUT%: 63 % (ref 38.4–76.8)
Platelets: 80 10*3/uL — ABNORMAL LOW (ref 145–400)
RBC: 3.39 10*6/uL — ABNORMAL LOW (ref 3.70–5.45)
RDW: 14.2 % (ref 11.2–14.5)
WBC: 3.5 10*3/uL — ABNORMAL LOW (ref 3.9–10.3)
lymph#: 0.7 10*3/uL — ABNORMAL LOW (ref 0.9–3.3)

## 2014-11-22 LAB — MAGNESIUM (CC13): Magnesium: 0.7 mg/dl — CL (ref 1.5–2.5)

## 2014-11-22 MED ORDER — MAGNESIUM SULFATE 50 % IJ SOLN
4.0000 g | Freq: Once | INTRAMUSCULAR | Status: AC
  Administered 2014-11-22: 4 g via INTRAVENOUS
  Filled 2014-11-22: qty 8

## 2014-11-22 MED ORDER — POTASSIUM CHLORIDE 2 MEQ/ML IV SOLN
Freq: Once | INTRAVENOUS | Status: AC
  Administered 2014-11-22: 16:00:00 via INTRAVENOUS
  Filled 2014-11-22: qty 10

## 2014-11-22 MED ORDER — SODIUM CHLORIDE 0.9 % IJ SOLN
10.0000 mL | INTRAMUSCULAR | Status: DC | PRN
  Administered 2014-11-22: 10 mL via INTRAVENOUS
  Filled 2014-11-22: qty 10

## 2014-11-22 MED ORDER — HEPARIN SOD (PORK) LOCK FLUSH 100 UNIT/ML IV SOLN
500.0000 [IU] | Freq: Once | INTRAVENOUS | Status: AC | PRN
  Administered 2014-11-22: 500 [IU]
  Filled 2014-11-22: qty 5

## 2014-11-22 MED ORDER — DIPHENHYDRAMINE HCL 50 MG/ML IJ SOLN
INTRAMUSCULAR | Status: AC
  Filled 2014-11-22: qty 1

## 2014-11-22 MED ORDER — SODIUM CHLORIDE 0.9 % IV SOLN
2.0000 g | Freq: Once | INTRAVENOUS | Status: AC
  Administered 2014-11-22: 2 g via INTRAVENOUS
  Filled 2014-11-22: qty 20

## 2014-11-22 MED ORDER — FAMOTIDINE IN NACL 20-0.9 MG/50ML-% IV SOLN
INTRAVENOUS | Status: AC
  Filled 2014-11-22: qty 50

## 2014-11-22 MED ORDER — SODIUM CHLORIDE 0.9 % IJ SOLN
10.0000 mL | INTRAMUSCULAR | Status: DC | PRN
  Administered 2014-11-22: 10 mL
  Filled 2014-11-22: qty 10

## 2014-11-22 NOTE — Progress Notes (Signed)
Per Dr. Alvy Bimler- ok to treat with Cisplatin despite platelet count. Will add extra Magnesium to hydration fluids. Hold camptosar until confirmed with Dr. Julien Nordmann.

## 2014-11-22 NOTE — Telephone Encounter (Signed)
Lomotil refill request approved per MD.E-scribed.

## 2014-11-22 NOTE — Patient Instructions (Signed)
Hypocalcemia, Adult Hypocalcemia is low blood calcium. Calcium is important for cells to function in the body. Low blood calcium can cause a variety of symptoms and problems. CAUSES   Low levels of a body protein called albumin.  Problems with the parathyroid glands or surgical removal of the parathyroid glands. The parathyroid glands maintain the body's level of calcium.  Decreased production or improper use of parathyroid hormone.  Lack (deficiency) of vitamin D or magnesium or both.  Intestinal problems that interfere with nutrient absorption.  Alcoholism.  Kidney problems.  Inflammation of the pancreas (pancreatitis).  Certain medicines.  Severe infections (sepsis).  Infiltrative diseases. With these diseases the parathyroid glands are filled with cells or substances that are not normally present. Examples include:  Sarcoidosis.  Hemachromatosis.  Breakdown of large amounts of muscle fiber.  High levels of phosphate in the body.  Cancer.  Massive blood transfusions which usually occur with severe trauma. SYMPTOMS   Numbness and tingling in the fingers, toes, or around the mouth.  Muscle aches or cramps, especially in the legs, feet, and back.  Muscle twitches.  Shortness of breath or wheezing.  Difficulty swallowing.  Changes in the sound of the voice.  General weakness.  Fainting.  Fast heart beats (palpitations).  Chest pain.  Irritability.  Difficulty thinking.  Memory problems or confusion.  Severe fatigue.  Changes in personality.  Depression and anxiety.  Shaking uncontrollably (seizures).  Coarse, brittle hair and nails.  Dry skin or lasting (chronic) skin diseases (psoriasis, eczema, or dermatitis).  Clouding of the eye lens (cataracts).  Abdominal cramping or pain. DIAGNOSIS  Hypocalcemia is usually diagnosed through blood tests that reveal a low level of blood calcium. Other tests, such as a recording of the electrical  activity of the heart (electrocardiogram, EKG), may be performed in order to diagnose the underlying cause of the condition. TREATMENT  Treatment for hypocalcemia includes giving calcium supplements. These can be given by mouth or by intravenous (IV) access tube, depending on the severity of the symptoms and deficiency. Other minerals (electrolytes), such as magnesium, may also be given. HOME CARE INSTRUCTIONS   Meet with a dietitian to make sure you are eating the most healthful diet possible, or follow diet instructions as directed by your caregiver.  Follow up with your caregiver as directed. SEEK IMMEDIATE MEDICAL CARE IF:   You develop chest pain.  You develop persistent rapid or irregular heartbeats.  You have difficulty breathing.  You faint.  You develop increased fatigue.  You have new swelling in the feet, ankles, or legs.  You develop increased muscle twitching.  You start to have seizures.  You develop confusion.  You develop mood, memory, or personality changes. MAKE SURE YOU:   Understand these instructions.  Will watch your condition.  Will get help right away if you are not doing well or get worse. Document Released: 10/23/2009 Document Revised: 07/28/2011 Document Reviewed: 10/23/2009 [facility] Patient Information 2015 [place], [place]. This information is not intended to replace advice given to you by your health care provider. Make sure you discuss any questions you have with your health care provider.  Hypomagnesemia Magnesium is a common ion (mineral) in the body which is needed for metabolism. It is about how the body handles food and other chemical reactions necessary for life. Only about 2% of the magnesium in our body is found in the blood. When this is low, it is called hypomagnesemia. The blood will measure only a tiny amount of the magnesium  in our body. When it is low in our blood, it does not mean that the whole body supply is low. The normal serum  concentration ranges from 1.8-2.5 mEq/L. When the level gets to be less than 1.0 mEq/L, a number of problems begin to happen.  CAUSES   Receiving intravenous fluids without magnesium replacement.  Loss of magnesium from the bowel by nasogastric suction.  Loss of magnesium from nausea and vomiting or severe diarrhea. Any of the inflammatory bowel conditions can cause this.  Abuse of alcohol often leads to low serum magnesium.  An inherited form of magnesium loss happens when the kidneys lose magnesium. This is called familial or primary hypomagnesemia.  Some medications such as diuretics also cause the loss of magnesium. SYMPTOMS  These following problems are worse if the changes in magnesium levels come on suddenly.  Tremor.  Confusion.  Muscle weakness.  Oversensitive to sights and sounds.  Sensitive reflexes.  Depression.  Muscular fibrillations.  Overreactivity of the nerves.  Irritability.  Psychosis.  Spasms of the hand muscles.  Tetany (where the muscles go into uncontrollable spasms). DIAGNOSIS  This condition can be diagnosed by blood tests. TREATMENT   In an emergency, magnesium can be given intravenously (by vein).  If the condition is less worrisome, it can be corrected by diet. High levels of magnesium are found in green leafy vegetables, peas, beans, and nuts among other things. It can also be given through medications by mouth.  If it is being caused by medications, changes can be made.  If alcohol is a problem, help is available if there are difficulties giving it up. Document Released: 01/29/2005 Document Revised: 09/19/2013 Document Reviewed: 12/24/2007 [facility] Patient Information 2015 [place], [place]. This information is not intended to replace advice given to you by your health care provider. Make sure you discuss any questions you have with your health care provider.

## 2014-11-22 NOTE — Patient Instructions (Signed)

## 2014-11-22 NOTE — Progress Notes (Signed)
Notified per Stanton Kidney, RN- per Dr. Julien Nordmann- no chemo today. Will add order Calcium, IV.

## 2014-11-23 ENCOUNTER — Other Ambulatory Visit: Payer: Self-pay | Admitting: *Deleted

## 2014-11-23 MED ORDER — POTASSIUM CHLORIDE CRYS ER 20 MEQ PO TBCR: 20.0000 meq | EXTENDED_RELEASE_TABLET | Freq: Two times a day (BID) | ORAL | Status: DC

## 2014-11-23 MED ORDER — MAGNESIUM OXIDE 400 (241.3 MG) MG PO TABS: 400.0000 mg | ORAL_TABLET | Freq: Four times a day (QID) | ORAL | Status: DC

## 2014-11-29 ENCOUNTER — Ambulatory Visit: Payer: Medicare Other

## 2014-11-29 ENCOUNTER — Ambulatory Visit (HOSPITAL_BASED_OUTPATIENT_CLINIC_OR_DEPARTMENT_OTHER): Payer: Medicare Other | Admitting: Physician Assistant

## 2014-11-29 ENCOUNTER — Ambulatory Visit (HOSPITAL_BASED_OUTPATIENT_CLINIC_OR_DEPARTMENT_OTHER): Payer: Medicare Other

## 2014-11-29 ENCOUNTER — Encounter: Payer: Self-pay | Admitting: Physician Assistant

## 2014-11-29 ENCOUNTER — Other Ambulatory Visit (HOSPITAL_BASED_OUTPATIENT_CLINIC_OR_DEPARTMENT_OTHER): Payer: Medicare Other

## 2014-11-29 VITALS — BP 144/69 | HR 76 | Temp 98.2°F | Resp 17

## 2014-11-29 DIAGNOSIS — Z95828 Presence of other vascular implants and grafts: Secondary | ICD-10-CM

## 2014-11-29 DIAGNOSIS — C3491 Malignant neoplasm of unspecified part of right bronchus or lung: Secondary | ICD-10-CM

## 2014-11-29 DIAGNOSIS — Z5111 Encounter for antineoplastic chemotherapy: Secondary | ICD-10-CM

## 2014-11-29 DIAGNOSIS — G893 Neoplasm related pain (acute) (chronic): Secondary | ICD-10-CM | POA: Diagnosis not present

## 2014-11-29 DIAGNOSIS — D6481 Anemia due to antineoplastic chemotherapy: Secondary | ICD-10-CM | POA: Diagnosis not present

## 2014-11-29 DIAGNOSIS — C7951 Secondary malignant neoplasm of bone: Secondary | ICD-10-CM | POA: Diagnosis not present

## 2014-11-29 DIAGNOSIS — C3490 Malignant neoplasm of unspecified part of unspecified bronchus or lung: Secondary | ICD-10-CM

## 2014-11-29 LAB — CBC WITH DIFFERENTIAL/PLATELET
BASO%: 1 % (ref 0.0–2.0)
Basophils Absolute: 0 10*3/uL (ref 0.0–0.1)
EOS ABS: 0 10*3/uL (ref 0.0–0.5)
EOS%: 0.1 % (ref 0.0–7.0)
HCT: 26.5 % — ABNORMAL LOW (ref 34.8–46.6)
HGB: 9 g/dL — ABNORMAL LOW (ref 11.6–15.9)
LYMPH%: 24.2 % (ref 14.0–49.7)
MCH: 30 pg (ref 25.1–34.0)
MCHC: 33.9 g/dL (ref 31.5–36.0)
MCV: 88.5 fL (ref 79.5–101.0)
MONO#: 0.5 10*3/uL (ref 0.1–0.9)
MONO%: 13.1 % (ref 0.0–14.0)
NEUT#: 2.1 10*3/uL (ref 1.5–6.5)
NEUT%: 61.6 % (ref 38.4–76.8)
PLATELETS: 216 10*3/uL (ref 145–400)
RBC: 2.99 10*6/uL — AB (ref 3.70–5.45)
RDW: 14.9 % — ABNORMAL HIGH (ref 11.2–14.5)
WBC: 3.5 10*3/uL — ABNORMAL LOW (ref 3.9–10.3)
lymph#: 0.8 10*3/uL — ABNORMAL LOW (ref 0.9–3.3)

## 2014-11-29 LAB — COMPREHENSIVE METABOLIC PANEL (CC13)
ALK PHOS: 73 U/L (ref 40–150)
ALT: 10 U/L (ref 0–55)
ANION GAP: 6 meq/L (ref 3–11)
AST: 16 U/L (ref 5–34)
Albumin: 3.3 g/dL — ABNORMAL LOW (ref 3.5–5.0)
BILIRUBIN TOTAL: 0.73 mg/dL (ref 0.20–1.20)
BUN: 15.2 mg/dL (ref 7.0–26.0)
CO2: 27 meq/L (ref 22–29)
Calcium: 8.7 mg/dL (ref 8.4–10.4)
Chloride: 101 mEq/L (ref 98–109)
Creatinine: 0.9 mg/dL (ref 0.6–1.1)
EGFR: 68 mL/min/{1.73_m2} — AB (ref >90)
Glucose: 99 mg/dl (ref 70–140)
Potassium: 5.2 mEq/L — ABNORMAL HIGH (ref 3.5–5.1)
Sodium: 135 mEq/L — ABNORMAL LOW (ref 136–145)
Total Protein: 6.8 g/dL (ref 6.4–8.3)

## 2014-11-29 LAB — MAGNESIUM (CC13): Magnesium: 2.1 mg/dl (ref 1.5–2.5)

## 2014-11-29 LAB — POTASSIUM (CC13): POTASSIUM: 5.3 meq/L — AB (ref 3.5–5.1)

## 2014-11-29 MED ORDER — PALONOSETRON HCL INJECTION 0.25 MG/5ML
0.2500 mg | Freq: Once | INTRAVENOUS | Status: AC
  Administered 2014-11-29: 0.25 mg via INTRAVENOUS

## 2014-11-29 MED ORDER — ATROPINE SULFATE 1 MG/ML IJ SOLN
INTRAMUSCULAR | Status: AC
  Filled 2014-11-29: qty 1

## 2014-11-29 MED ORDER — ATROPINE SULFATE 1 MG/ML IJ SOLN
0.5000 mg | Freq: Once | INTRAMUSCULAR | Status: AC | PRN
  Administered 2014-11-29: 0.5 mg via INTRAVENOUS

## 2014-11-29 MED ORDER — SODIUM CHLORIDE 0.9 % IV SOLN
Freq: Once | INTRAVENOUS | Status: AC
  Administered 2014-11-29: 10:00:00 via INTRAVENOUS

## 2014-11-29 MED ORDER — SODIUM CHLORIDE 0.9 % IV SOLN
20.0000 mg/m2 | Freq: Once | INTRAVENOUS | Status: AC
  Administered 2014-11-29: 33 mg via INTRAVENOUS
  Filled 2014-11-29: qty 33

## 2014-11-29 MED ORDER — SODIUM CHLORIDE 0.9 % IJ SOLN
10.0000 mL | INTRAMUSCULAR | Status: DC | PRN
  Administered 2014-11-29: 10 mL
  Filled 2014-11-29: qty 10

## 2014-11-29 MED ORDER — DEXTROSE-NACL 5-0.45 % IV SOLN
Freq: Once | INTRAVENOUS | Status: AC
  Administered 2014-11-29: 10:00:00 via INTRAVENOUS
  Filled 2014-11-29: qty 10

## 2014-11-29 MED ORDER — HEPARIN SOD (PORK) LOCK FLUSH 100 UNIT/ML IV SOLN
500.0000 [IU] | Freq: Once | INTRAVENOUS | Status: AC | PRN
  Administered 2014-11-29: 500 [IU]
  Filled 2014-11-29: qty 5

## 2014-11-29 MED ORDER — PALONOSETRON HCL INJECTION 0.25 MG/5ML
INTRAVENOUS | Status: AC
  Filled 2014-11-29: qty 5

## 2014-11-29 MED ORDER — SODIUM CHLORIDE 0.9 % IV SOLN
Freq: Once | INTRAVENOUS | Status: AC
  Administered 2014-11-29: 14:00:00 via INTRAVENOUS
  Filled 2014-11-29: qty 5

## 2014-11-29 MED ORDER — SODIUM CHLORIDE 0.9 % IJ SOLN
10.0000 mL | INTRAMUSCULAR | Status: DC | PRN
  Administered 2014-11-29: 10 mL via INTRAVENOUS
  Filled 2014-11-29: qty 10

## 2014-11-29 MED ORDER — IRINOTECAN HCL CHEMO INJECTION 100 MG/5ML
45.0000 mg/m2 | Freq: Once | INTRAVENOUS | Status: AC
  Administered 2014-11-29: 74 mg via INTRAVENOUS
  Filled 2014-11-29: qty 3.7

## 2014-11-29 NOTE — Progress Notes (Addendum)
[place] [phone]   [phone]  OFFICE PROGRESS NOTE  [name], MD [address] [place] [address]  DIAGNOSIS: Extensive stage, stage IV (T3, N2, M1b) small cell lung cancer diagnosed in October 2015.  PRIOR THERAPY: Systemic chemotherapy with carboplatin for AUC of 5 on day 1 and etoposide 100 MG/M2 on days 1, 2 and 3 with Neulasta support on day 4. Status post 6 cycles. Starting from cycle #5 carboplatin with for AUC of 4 and etoposide 80 MG/M2  CURRENT THERAPY: The patient restarted systemic chemotherapy with cisplatin 25 mg meter squared and irinotecan 65 mg meter squared given day 1 and day 8 of each cycle. She started this on 10/25/2014. Status post 1 cycle.  INTERVAL HISTORY: [name] 74 y.o. female returns to the clinic today for follow-up visit accompanied by her husband. The patient is status post one cycle of systemic chemotherapy with cisplatin and irinotecan. She has had significant issues with diarrhea and electrolyte abnormalities. She was to have received day 1 of cycle #2 last week but received IV fluids, calcium and magnesium instead.  She states the diarrhea slowed down, having had 2 episodes on Sunday and one episode daily through today. She took a dose of Lomotil before leaving the house because of the episode of diarrhea. She is eating a little bit better. She denies any bruising or bleeding.  She continues to have back pain and uses oxycodone.  She denied having fever or chills. The patient denied having any significant chest pain but continues to have shortness breath with exertion.   MEDICAL HISTORY: Past Medical History  Diagnosis Date  . GERD (gastroesophageal reflux disease)   . IBS (irritable bowel syndrome)   . Adenomatous colon polyp 06/2009  . Hiatal hernia   . Breast cyst   . Diverticulosis   . Hemorrhoids   . Hyperlipidemia   . Hypertension   . Hyperparathyroidism   . Anxiety   . Migraine   .  Osteoporosis   . Vitamin D deficiency   . Spinal stenosis   . C. difficile colitis 2006  . Small cell lung carcinoma dx'd 02/2014  . Bone metastases dx'd 02/2014    ALLERGIES:  is allergic to ace inhibitors; cephalexin; etodolac; and gabapentin.  MEDICATIONS:  Current Outpatient Prescriptions  Medication Sig Dispense Refill  . ALPRAZolam (XANAX) 0.5 MG tablet Take 0.5 mg by mouth at bedtime.      Marland Kitchen atorvastatin (LIPITOR) 80 MG tablet Take 80 mg by mouth every morning.     Marland Kitchen buPROPion (WELLBUTRIN SR) 150 MG 12 hr tablet Take 150 mg by mouth every morning.     Marland Kitchen CALCIUM PO Take 1 tablet by mouth 3 (three) times daily. Starting 06/21/14--taking '600mg'$  three times daily    . carbidopa-levodopa (SINEMET IR) 25-100 MG per tablet Take 1 tablet by mouth 3 (three) times daily. 270 tablet 1  . DEXILANT 60 MG capsule TAKE ONE CAPSULE BY MOUTH DAILY 30 capsule 0  . Diclofenac (ZORVOLEX) 35 MG CAPS Take 35 mg by mouth every morning.     . diphenoxylate-atropine (LOMOTIL) 2.5-0.025 MG per tablet TAKE 2 TABLETS BY MOUTH 4 TIMES DAILY AS NEEDED FOR DIARRHEA OR LOOSE STOOLS 30 tablet 0  . feeding supplement, ENSURE COMPLETE, (ENSURE COMPLETE) LIQD Take 237 mLs by mouth 2 (two) times daily between meals. 237 mL 0  . glycopyrrolate (ROBINUL) 2 MG tablet TAKE 1 TABLET (2 MG TOTAL) BY MOUTH 2 (TWO)  TIMES DAILY. 60 tablet 0  . hydrochlorothiazide (HYDRODIURIL) 12.5 MG tablet Take 1 tablet by mouth every morning.     . lidocaine-prilocaine (EMLA) cream Apply 1 application topically as needed. Apply to port 1 hr before chemo 30 g 0  . LORazepam (ATIVAN) 0.5 MG tablet Take 1 tablet (0.5 mg total) by mouth every 8 (eight) hours. 30 tablet 0  . magnesium oxide (MAG-OX) 400 (241.3 MG) MG tablet Take 1 tablet (400 mg total) by mouth 4 (four) times daily. 40 tablet 0  . metoprolol (TOPROL-XL) 50 MG 24 hr tablet Take 50 mg by mouth every morning.     . ondansetron (ZOFRAN) 8 MG tablet Take 1 tablet (8 mg total) by mouth  every 8 (eight) hours as needed for nausea or vomiting. 20 tablet 0  . oxyCODONE-acetaminophen (PERCOCET/ROXICET) 5-325 MG per tablet Take 1 tablet by mouth every 6 (six) hours as needed for severe pain. 30 tablet 0  . Polyethylene Glycol 3350 (MIRALAX PO) Take 1 scoop by mouth daily as needed (for constipation).     . potassium chloride SA (K-DUR,KLOR-CON) 20 MEQ tablet Take 1 tablet (20 mEq total) by mouth 2 (two) times daily. 20 tablet 0  . prochlorperazine (COMPAZINE) 10 MG tablet Take 1 tablet (10 mg total) by mouth every 6 (six) hours as needed for nausea or vomiting. 30 tablet 1  . rOPINIRole (REQUIP) 1 MG tablet TAKE 1 TABLET (1 MG TOTAL) BY MOUTH 3 (THREE) TIMES DAILY. 90 tablet 1   No current facility-administered medications for this visit.   Facility-Administered Medications Ordered in Other Visits  Medication Dose Route Frequency Provider Last Rate Last Dose  . CISplatin (PLATINOL) 33 mg in sodium chloride 0.9 % 250 mL chemo infusion  20 mg/m2 (Treatment Plan Actual) Intravenous Once Curt Bears, MD 283 mL/hr at 11/29/14 1615 33 mg at 11/29/14 1615  . heparin lock flush 100 unit/mL  500 Units Intracatheter Once PRN Curt Bears, MD      . sodium chloride 0.9 % injection 10 mL  10 mL Intracatheter PRN Curt Bears, MD        SURGICAL HISTORY:  Past Surgical History  Procedure Laterality Date  . Cholecystectomy    . Vaginal hysterectomy    . Knee arthroscopy      left  . Parathyroidectomy  2009    [facility]  . Lumbar spine surgery  2001  . Breast biopsy      bilateral  . Total knee arthroplasty      right  . Bladder repair    . Video bronchoscopy Bilateral 03/14/2014    Procedure: VIDEO BRONCHOSCOPY WITHOUT FLUORO;  Surgeon: Tanda Rockers, MD;  Location: [facility];  Service: Cardiopulmonary;  Laterality: Bilateral;    REVIEW OF SYSTEMS:  Constitutional: positive for anorexia and fatigue Eyes: negative Ears, nose, mouth, throat, and face: positive for  epistaxis and brief epissodes involving left nostril, now resolved Respiratory: positive for dyspnea on exertion Cardiovascular: negative Gastrointestinal: positive for diarrhea Genitourinary:negative Integument/breast: negative Hematologic/lymphatic: negative Musculoskeletal:positive for back pain Neurological: positive for tremors Behavioral/Psych: negative Endocrine: negative Allergic/Immunologic: negative   PHYSICAL EXAMINATION: General appearance: alert, cooperative, fatigued and no distress Head: Normocephalic, without obvious abnormality, atraumatic Neck: no adenopathy, no JVD, supple, symmetrical, trachea midline and thyroid not enlarged, symmetric, no tenderness/mass/nodules Lymph nodes: Cervical, supraclavicular, and axillary nodes normal. Resp: clear to auscultation bilaterally Back: symmetric, no curvature. ROM normal. No CVA tenderness. Cardio: regular rate and rhythm, S1, S2 normal, no murmur, click,  rub or gallop GI: soft, non-tender; bowel sounds normal; no masses,  no organomegaly Extremities: extremities normal, atraumatic, no cyanosis or edema Neurologic: Alert and oriented X 3, normal strength and tone. Normal symmetric reflexes. Normal coordination and gait  ECOG PERFORMANCE STATUS: 2 - Symptomatic, <50% confined to bed  There were no vitals taken for this visit.  LABORATORY DATA: Lab Results  Component Value Date   WBC 3.5* 11/29/2014   HGB 9.0* 11/29/2014   HCT 26.5* 11/29/2014   MCV 88.5 11/29/2014   PLT 216 11/29/2014      Chemistry      Component Value Date/Time   NA 135* 11/29/2014 0838   NA 136 05/17/2014 0455   K 5.3* 11/29/2014 0954   K 3.4* 05/17/2014 0455   CL 95* 05/17/2014 0455   CO2 27 11/29/2014 0838   CO2 32 05/17/2014 0455   BUN 15.2 11/29/2014 0838   BUN 6 05/17/2014 0455   CREATININE 0.9 11/29/2014 0838   CREATININE 0.78 05/17/2014 0455      Component Value Date/Time   CALCIUM 8.7 11/29/2014 0838   CALCIUM 7.7* 05/17/2014  0455   ALKPHOS 73 11/29/2014 0838   ALKPHOS 232* 05/13/2014 1605   AST 16 11/29/2014 0838   AST 15 05/13/2014 1605   ALT 10 11/29/2014 0838   ALT 10 05/13/2014 1605   BILITOT 0.73 11/29/2014 0838   BILITOT 1.1 05/13/2014 1605       RADIOGRAPHIC STUDIES: No results found.  ASSESSMENT AND PLAN: This is a very pleasant 74 year old white female with:  1) Extensive stage small cell lung cancer currently undergoing systemic chemotherapy with carboplatin and etoposide status post 6 cycles. She had good response to the first line treatment and she had been on observation for the last 2 months. The recent CT scan of the chest, abdomen and pelvis showed interval progression of the metastatic disease with bulky mediastinal lymph nodes as well as multiple new pulmonary nodules and new paraspinal soft tissue masses as well as extensive bone metastasis. The patient is now on cisplatin 25 MG/M2 and irinotecan 65 MG/M2 on days 1 and 8 every 3 weeks. Status post 1 cycle.  The patient was discussed with and also seen by Dr. Julien Nordmann. She will proceed with technically day 8 of cycle #2 with reduced dose cisplatin at 20 mg/m2 and irinotecan at 45 mg/m. We will plan to obtain a restaging CT scan of the chest, abdomen and pelvis prior to when she returns in 2 weeks for reevaluation prior to the start of cycle #3.  She may continue to use Imodium and Lomotil as previously discussed to control her diarrhea.  2) metastatic bone disease: The patient will continue on Xgeva 120 mcg subcutaneously every 4 weeks. The patient has no dental issues and currently has dentures. She was also advised to take calcium and vitamin D supplements on daily basis.  3) chemotherapy-induced anemia, stable. We'll continue to monitor for now  4) For the tremors,  She is also followed by neurology. She will continue on Sinemet IR.  5) right neck and back pain: She is on Percocet 5/325 mg by mouth every 6 hours as needed.    6)  hypokalemia: This is been resolved and she is now just slightly hyperkalemic. She is likely to become hypokalemic again after today's chemotherapy. She was instructed to decrease her potassium supplementation to 20 mEq by mouth once daily. Both she and her husband voiced understanding.  The patient will follow-up in  2 weeks a restaging CT scan of the chest, abdomen and pelvis with contrast to reevaluate her disease. She'll continue with weekly labs as scheduled.  The patient was advised to call immediately if she has any concerning symptoms in the interval. The patient voices understanding of current disease status and treatment options and is in agreement with the current care plan.  All questions were answered. The patient knows to call the clinic with any problems, questions or concerns. We can certainly see the patient much sooner if necessary.   Disclaimer: This note was dictated with voice recognition software. Similar sounding words can inadvertently be transcribed and may not be corrected upon review.   Carlton Adam, PA-C 11/29/2014   ADDENDUM: Hematology/Oncology Attending: I had a face to face encounter with the patient today. I recommended her care plan. This is a very pleasant 74 years old white female with metastatic small cell lung cancer currently undergoing second line treatment with reduced dose cisplatin and irinotecan is status post 1 cycle. The patient has rough time dealing with the first cycle of her treatment with increasing diarrhea as well as electrolyte abnormalities. She had aggressive electrolyte replacement the last 2 weeks. I recommended for her to proceed with cycle #2 today as a scheduled but I will reduce the dose of cisplatin to 20 MG/KG and irinotecan to 45 MG/KG on days 1 and 8 every 3 weeks. She would have repeat CT scan of the chest, abdomen and pelvis after cycle #2 for evaluation of her condition. She was advised to call immediately if she has any  concerning symptoms in the interval.  Disclaimer: This note was dictated with voice recognition software. Similar sounding words can inadvertently be transcribed and may be missed upon review. Eilleen Kempf., MD 12/02/2014

## 2014-11-29 NOTE — Progress Notes (Signed)
Pt assessed at chairside by [name].  Plan is to go forward with chemo infusion today with dose reduction d/t side effects from last treatment.  Spoke with Adrena about K+ level who ordered redraw.  Patient notified to cut back on Potassium dose and only take 1 pill per day as opposed to 2.  Medication instructions included in patient AVS.  Pharmacy verified it is okay to proceed with hydration fluid in conjunction with pt cutting back on oral K+ at home.  Pt verbalized understanding and no further questions at this time.  14:05 - Magnesium level ordered d/t fluctuating electrolyte levels.  Magnesium level added on to previous lab and came back within normal limits.  Earlie Server, MD notified.

## 2014-11-29 NOTE — Patient Instructions (Addendum)
[facility] Discharge Instructions for Patients Receiving Chemotherapy  Today you received the following chemotherapy agents Irinotecan/Cisplatin.  To help prevent nausea and vomiting after your treatment, we encourage you to take your nausea medication as directed.   If you develop nausea and vomiting that is not controlled by your nausea medication, call the clinic.   BELOW ARE SYMPTOMS THAT SHOULD BE REPORTED IMMEDIATELY:  *FEVER GREATER THAN 100.5 F  *CHILLS WITH OR WITHOUT FEVER  NAUSEA AND VOMITING THAT IS NOT CONTROLLED WITH YOUR NAUSEA MEDICATION  *UNUSUAL SHORTNESS OF BREATH  *UNUSUAL BRUISING OR BLEEDING  TENDERNESS IN MOUTH AND THROAT WITH OR WITHOUT PRESENCE OF ULCERS  *URINARY PROBLEMS  *BOWEL PROBLEMS  UNUSUAL RASH Items with * indicate a potential emergency and should be followed up as soon as possible.  Feel free to call the clinic you have any questions or concerns. The clinic phone number is [phone].  Please show the [place] [facility] at check-in to the Emergency Department and triage nurse.  **Reduce to 1 Potassium pill daily.**   Cisplatin injection What is this medicine? CISPLATIN (SIS pla tin) is a chemotherapy drug. It targets fast dividing cells, like cancer cells, and causes these cells to die. This medicine is used to treat many types of cancer like bladder, ovarian, and testicular cancers. This medicine may be used for other purposes; ask your health care provider or pharmacist if you have questions. COMMON BRAND NAME(S): Platinol, Platinol -AQ What should I tell my health care provider before I take this medicine? They need to know if you have any of these conditions: -blood disorders -hearing problems -kidney disease -recent or ongoing radiation therapy -an unusual or allergic reaction to cisplatin, carboplatin, other chemotherapy, other medicines, foods, dyes, or preservatives -pregnant or trying to get  pregnant -breast-feeding How should I use this medicine? This drug is given as an infusion into a vein. It is administered in a hospital or clinic by a specially trained health care professional. Talk to your pediatrician regarding the use of this medicine in children. Special care may be needed. Overdosage: If you think you have taken too much of this medicine contact a poison control center or emergency room at once. NOTE: This medicine is only for you. Do not share this medicine with others. What if I miss a dose? It is important not to miss a dose. Call your doctor or health care professional if you are unable to keep an appointment. What may interact with this medicine? -dofetilide -foscarnet -medicines for seizures -medicines to increase blood counts like filgrastim, pegfilgrastim, sargramostim -probenecid -pyridoxine used with altretamine -rituximab -some antibiotics like amikacin, gentamicin, neomycin, polymyxin B, streptomycin, tobramycin -sulfinpyrazone -vaccines -zalcitabine Talk to your doctor or health care professional before taking any of these medicines: -acetaminophen -aspirin -ibuprofen -ketoprofen -naproxen This list may not describe all possible interactions. Give your health care provider a list of all the medicines, herbs, non-prescription drugs, or dietary supplements you use. Also tell them if you smoke, drink alcohol, or use illegal drugs. Some items may interact with your medicine. What should I watch for while using this medicine? Your condition will be monitored carefully while you are receiving this medicine. You will need important blood work done while you are taking this medicine. This drug may make you feel generally unwell. This is not uncommon, as chemotherapy can affect healthy cells as well as cancer cells. Report any side effects. Continue your course of treatment even though you feel ill  unless your doctor tells you to stop. In some cases, you may  be given additional medicines to help with side effects. Follow all directions for their use. Call your doctor or health care professional for advice if you get a fever, chills or sore throat, or other symptoms of a cold or flu. Do not treat yourself. This drug decreases your body's ability to fight infections. Try to avoid being around people who are sick. This medicine may increase your risk to bruise or bleed. Call your doctor or health care professional if you notice any unusual bleeding. Be careful brushing and flossing your teeth or using a toothpick because you may get an infection or bleed more easily. If you have any dental work done, tell your dentist you are receiving this medicine. Avoid taking products that contain aspirin, acetaminophen, ibuprofen, naproxen, or ketoprofen unless instructed by your doctor. These medicines may hide a fever. Do not become pregnant while taking this medicine. Women should inform their doctor if they wish to become pregnant or think they might be pregnant. There is a potential for serious side effects to an unborn child. Talk to your health care professional or pharmacist for more information. Do not breast-feed an infant while taking this medicine. Drink fluids as directed while you are taking this medicine. This will help protect your kidneys. Call your doctor or health care professional if you get diarrhea. Do not treat yourself. What side effects may I notice from receiving this medicine? Side effects that you should report to your doctor or health care professional as soon as possible: -allergic reactions like skin rash, itching or hives, swelling of the face, lips, or tongue -signs of infection - fever or chills, cough, sore throat, pain or difficulty passing urine -signs of decreased platelets or bleeding - bruising, pinpoint red spots on the skin, black, tarry stools, nosebleeds -signs of decreased red blood cells - unusually weak or tired, fainting  spells, lightheadedness -breathing problems -changes in hearing -gout pain -low blood counts - This drug may decrease the number of white blood cells, red blood cells and platelets. You may be at increased risk for infections and bleeding. -nausea and vomiting -pain, swelling, redness or irritation at the injection site -pain, tingling, numbness in the hands or feet -problems with balance, movement -trouble passing urine or change in the amount of urine Side effects that usually do not require medical attention (report to your doctor or health care professional if they continue or are bothersome): -changes in vision -loss of appetite -metallic taste in the mouth or changes in taste This list may not describe all possible side effects. Call your doctor for medical advice about side effects. You may report side effects to FDA at 1-800-FDA-1088. Where should I keep my medicine? This drug is given in a hospital or clinic and will not be stored at home. NOTE: This sheet is a summary. It may not cover all possible information. If you have questions about this medicine, talk to your doctor, pharmacist, or health care provider.  2015, [employer] Standard. (2007-08-10 14:40:54) Irinotecan injection What is this medicine? IRINOTECAN (ir in oh TEE kan ) is a chemotherapy drug. It is used to treat colon and rectal cancer. This medicine may be used for other purposes; ask your health care provider or pharmacist if you have questions. COMMON BRAND NAME(S): Camptosar What should I tell my health care provider before I take this medicine? They need to know if you have any of these conditions: -  blood disorders -dehydration -diarrhea -infection (especially a virus infection such as chickenpox, cold sores, or herpes) -liver disease -low blood counts, like low white cell, platelet, or red cell counts -recent or ongoing radiation therapy -an unusual or allergic reaction to irinotecan, sorbitol, other  chemotherapy, other medicines, foods, dyes, or preservatives -pregnant or trying to get pregnant -breast-feeding How should I use this medicine? This drug is given as an infusion into a vein. It is administered in a hospital or clinic by a specially trained health care professional. Talk to your pediatrician regarding the use of this medicine in children. Special care may be needed. Overdosage: If you think you have taken too much of this medicine contact a poison control center or emergency room at once. NOTE: This medicine is only for you. Do not share this medicine with others. What if I miss a dose? It is important not to miss your dose. Call your doctor or health care professional if you are unable to keep an appointment. What may interact with this medicine? Do not take this medicine with any of the following medications: -atazanavir -certain medicines for fungal infections like itraconazole and ketoconazole -St. John's Wort This medicine may also interact with the following medications: -dexamethasone -diuretics -laxatives -medicines for seizures like carbamazepine, mephobarbital, phenobarbital, phenytoin, primidone -medicines to increase blood counts like filgrastim, pegfilgrastim, sargramostim -prochlorperazine -vaccines This list may not describe all possible interactions. Give your health care provider a list of all the medicines, herbs, non-prescription drugs, or dietary supplements you use. Also tell them if you smoke, drink alcohol, or use illegal drugs. Some items may interact with your medicine. What should I watch for while using this medicine? Your condition will be monitored carefully while you are receiving this medicine. You will need important blood work done while you are taking this medicine. This drug may make you feel generally unwell. This is not uncommon, as chemotherapy can affect healthy cells as well as cancer cells. Report any side effects. Continue your  course of treatment even though you feel ill unless your doctor tells you to stop. In some cases, you may be given additional medicines to help with side effects. Follow all directions for their use. You may get drowsy or dizzy. Do not drive, use machinery, or do anything that needs mental alertness until you know how this medicine affects you. Do not stand or sit up quickly, especially if you are an older patient. This reduces the risk of dizzy or fainting spells. Call your doctor or health care professional for advice if you get a fever, chills or sore throat, or other symptoms of a cold or flu. Do not treat yourself. This drug decreases your body's ability to fight infections. Try to avoid being around people who are sick. This medicine may increase your risk to bruise or bleed. Call your doctor or health care professional if you notice any unusual bleeding. Be careful brushing and flossing your teeth or using a toothpick because you may get an infection or bleed more easily. If you have any dental work done, tell your dentist you are receiving this medicine. Avoid taking products that contain aspirin, acetaminophen, ibuprofen, naproxen, or ketoprofen unless instructed by your doctor. These medicines may hide a fever. Do not become pregnant while taking this medicine. Women should inform their doctor if they wish to become pregnant or think they might be pregnant. There is a potential for serious side effects to an unborn child. Talk to your health care professional  or pharmacist for more information. Do not breast-feed an infant while taking this medicine. What side effects may I notice from receiving this medicine? Side effects that you should report to your doctor or health care professional as soon as possible: -allergic reactions like skin rash, itching or hives, swelling of the face, lips, or tongue -low blood counts - this medicine may decrease the number of white blood cells, red blood cells  and platelets. You may be at increased risk for infections and bleeding. -signs of infection - fever or chills, cough, sore throat, pain or difficulty passing urine -signs of decreased platelets or bleeding - bruising, pinpoint red spots on the skin, black, tarry stools, blood in the urine -signs of decreased red blood cells - unusually weak or tired, fainting spells, lightheadedness -breathing problems -chest pain -diarrhea -feeling faint or lightheaded, falls -flushing, runny nose, sweating during infusion -mouth sores or pain -pain, swelling, redness or irritation where injected -pain, swelling, warmth in the leg -pain, tingling, numbness in the hands or feet -problems with balance, talking, walking -stomach cramps, pain -trouble passing urine or change in the amount of urine -vomiting as to be unable to hold down drinks or food -yellowing of the eyes or skin Side effects that usually do not require medical attention (report to your doctor or health care professional if they continue or are bothersome): -constipation -hair loss -headache -loss of appetite -nausea, vomiting -stomach upset This list may not describe all possible side effects. Call your doctor for medical advice about side effects. You may report side effects to FDA at 1-800-FDA-1088. Where should I keep my medicine? This drug is given in a hospital or clinic and will not be stored at home. NOTE: This sheet is a summary. It may not cover all possible information. If you have questions about this medicine, talk to your doctor, pharmacist, or health care provider.  2015, [employer] Standard. (2012-11-01 16:29:32)

## 2014-11-29 NOTE — Patient Instructions (Signed)

## 2014-11-30 ENCOUNTER — Telehealth: Payer: Self-pay | Admitting: Internal Medicine

## 2014-11-30 NOTE — Telephone Encounter (Signed)
s.w. pt husband and advised on July appt....ok and aware he will have her get new sched at labs

## 2014-12-01 ENCOUNTER — Telehealth: Payer: Self-pay | Admitting: *Deleted

## 2014-12-01 NOTE — Telephone Encounter (Signed)
Pt states she was told to notify Dr Julien Nordmann immediately if she has any diarrhea. Had 1 episode this morning at 0930. Took 2 imodium and has lomotil to take as well. States she has pedialyte at home to drink. RN notified Mandy Metro, PA. Advised patient to take meds as directed previously. Drink lots of fluids.  RN called patient with directions, will call patient at 1200 to follow up. Pt is to call us back if having more diarrhea prior to 1200. Will get her in for fluids if needed  12:40 Spoke with patient. Has not had any further diarrhea. Tolerating fluids

## 2014-12-01 NOTE — Patient Instructions (Signed)
Decrease your potassium to one 20 mEq tablet by mouth daily Follow-up in 2 weeks with a restaging CT scan of your chest, abdomen and pelvis to reevaluate your disease

## 2014-12-05 ENCOUNTER — Other Ambulatory Visit: Payer: Self-pay | Admitting: Medical Oncology

## 2014-12-05 DIAGNOSIS — C3490 Malignant neoplasm of unspecified part of unspecified bronchus or lung: Secondary | ICD-10-CM

## 2014-12-06 ENCOUNTER — Other Ambulatory Visit: Payer: Self-pay | Admitting: Nurse Practitioner

## 2014-12-06 ENCOUNTER — Other Ambulatory Visit (HOSPITAL_BASED_OUTPATIENT_CLINIC_OR_DEPARTMENT_OTHER): Payer: Medicare Other

## 2014-12-06 ENCOUNTER — Ambulatory Visit: Payer: Medicare Other

## 2014-12-06 ENCOUNTER — Ambulatory Visit (HOSPITAL_BASED_OUTPATIENT_CLINIC_OR_DEPARTMENT_OTHER): Payer: Medicare Other | Admitting: Nurse Practitioner

## 2014-12-06 ENCOUNTER — Encounter: Payer: Self-pay | Admitting: Nurse Practitioner

## 2014-12-06 ENCOUNTER — Ambulatory Visit (HOSPITAL_COMMUNITY)
Admission: RE | Admit: 2014-12-06 | Discharge: 2014-12-06 | Disposition: A | Discharge status: Home / Self Care | Payer: Medicare Other | Source: Ambulatory Visit | Attending: Internal Medicine | Admitting: Internal Medicine

## 2014-12-06 ENCOUNTER — Ambulatory Visit (HOSPITAL_BASED_OUTPATIENT_CLINIC_OR_DEPARTMENT_OTHER): Payer: Medicare Other

## 2014-12-06 VITALS — BP 133/54 | HR 71 | Temp 98.0°F | Resp 17

## 2014-12-06 VITALS — BP 95/52 | HR 79 | Temp 97.9°F | Resp 16

## 2014-12-06 DIAGNOSIS — D6481 Anemia due to antineoplastic chemotherapy: Secondary | ICD-10-CM | POA: Diagnosis not present

## 2014-12-06 DIAGNOSIS — C3490 Malignant neoplasm of unspecified part of unspecified bronchus or lung: Secondary | ICD-10-CM | POA: Insufficient documentation

## 2014-12-06 DIAGNOSIS — I959 Hypotension, unspecified: Secondary | ICD-10-CM

## 2014-12-06 DIAGNOSIS — C3491 Malignant neoplasm of unspecified part of right bronchus or lung: Secondary | ICD-10-CM

## 2014-12-06 DIAGNOSIS — D649 Anemia, unspecified: Secondary | ICD-10-CM

## 2014-12-06 DIAGNOSIS — Z95828 Presence of other vascular implants and grafts: Secondary | ICD-10-CM

## 2014-12-06 DIAGNOSIS — E860 Dehydration: Secondary | ICD-10-CM | POA: Diagnosis not present

## 2014-12-06 DIAGNOSIS — R531 Weakness: Secondary | ICD-10-CM

## 2014-12-06 DIAGNOSIS — E162 Hypoglycemia, unspecified: Secondary | ICD-10-CM

## 2014-12-06 DIAGNOSIS — R112 Nausea with vomiting, unspecified: Secondary | ICD-10-CM

## 2014-12-06 DIAGNOSIS — D61810 Antineoplastic chemotherapy induced pancytopenia: Secondary | ICD-10-CM | POA: Diagnosis not present

## 2014-12-06 DIAGNOSIS — Z452 Encounter for adjustment and management of vascular access device: Secondary | ICD-10-CM

## 2014-12-06 DIAGNOSIS — R197 Diarrhea, unspecified: Secondary | ICD-10-CM

## 2014-12-06 DIAGNOSIS — T451X5A Adverse effect of antineoplastic and immunosuppressive drugs, initial encounter: Secondary | ICD-10-CM

## 2014-12-06 LAB — CBC WITH DIFFERENTIAL/PLATELET
BASO%: 0.7 % (ref 0.0–2.0)
Basophils Absolute: 0 10*3/uL (ref 0.0–0.1)
EOS%: 1 % (ref 0.0–7.0)
Eosinophils Absolute: 0 10*3/uL (ref 0.0–0.5)
HCT: 24.6 % — ABNORMAL LOW (ref 34.8–46.6)
HGB: 8.3 g/dL — ABNORMAL LOW (ref 11.6–15.9)
LYMPH%: 34.2 % (ref 14.0–49.7)
MCH: 29.6 pg (ref 25.1–34.0)
MCHC: 33.7 g/dL (ref 31.5–36.0)
MCV: 87.9 fL (ref 79.5–101.0)
MONO#: 0.3 10*3/uL (ref 0.1–0.9)
MONO%: 9.6 % (ref 0.0–14.0)
NEUT%: 54.5 % (ref 38.4–76.8)
NEUTROS ABS: 1.6 10*3/uL (ref 1.5–6.5)
PLATELETS: 182 10*3/uL (ref 145–400)
RBC: 2.8 10*6/uL — ABNORMAL LOW (ref 3.70–5.45)
RDW: 14.8 % — ABNORMAL HIGH (ref 11.2–14.5)
WBC: 2.9 10*3/uL — ABNORMAL LOW (ref 3.9–10.3)
lymph#: 1 10*3/uL (ref 0.9–3.3)

## 2014-12-06 LAB — COMPREHENSIVE METABOLIC PANEL (CC13)
ALBUMIN: 3.6 g/dL (ref 3.5–5.0)
ALT: 8 U/L (ref 0–55)
AST: 14 U/L (ref 5–34)
Alkaline Phosphatase: 77 U/L (ref 40–150)
Anion Gap: 7 mEq/L (ref 3–11)
BUN: 19.5 mg/dL (ref 7.0–26.0)
CHLORIDE: 95 meq/L — AB (ref 98–109)
CO2: 29 mEq/L (ref 22–29)
Calcium: 9.2 mg/dL (ref 8.4–10.4)
Creatinine: 0.9 mg/dL (ref 0.6–1.1)
EGFR: 62 mL/min/{1.73_m2} — ABNORMAL LOW (ref >90)
GLUCOSE: 96 mg/dL (ref 70–140)
Potassium: 4.8 mEq/L (ref 3.5–5.1)
SODIUM: 131 meq/L — AB (ref 136–145)
TOTAL PROTEIN: 6.7 g/dL (ref 6.4–8.3)
Total Bilirubin: 0.93 mg/dL (ref 0.20–1.20)

## 2014-12-06 LAB — PREPARE RBC (CROSSMATCH)

## 2014-12-06 LAB — HOLD TUBE, BLOOD BANK

## 2014-12-06 LAB — MAGNESIUM (CC13): MAGNESIUM: 2 mg/dL (ref 1.5–2.5)

## 2014-12-06 MED ORDER — SODIUM CHLORIDE 0.9 % IV SOLN
INTRAVENOUS | Status: AC
  Administered 2014-12-06: 11:00:00 via INTRAVENOUS

## 2014-12-06 MED ORDER — HEPARIN SOD (PORK) LOCK FLUSH 100 UNIT/ML IV SOLN
500.0000 [IU] | Freq: Every day | INTRAVENOUS | Status: AC | PRN
  Administered 2014-12-06: 500 [IU]
  Filled 2014-12-06: qty 5

## 2014-12-06 MED ORDER — SODIUM CHLORIDE 0.9 % IJ SOLN
10.0000 mL | INTRAMUSCULAR | Status: AC | PRN
  Administered 2014-12-06: 10 mL
  Filled 2014-12-06: qty 10

## 2014-12-06 MED ORDER — DIPHENHYDRAMINE HCL 25 MG PO CAPS
ORAL_CAPSULE | ORAL | Status: AC
  Filled 2014-12-06: qty 1

## 2014-12-06 MED ORDER — DIPHENHYDRAMINE HCL 25 MG PO CAPS
25.0000 mg | ORAL_CAPSULE | Freq: Once | ORAL | Status: AC
  Administered 2014-12-06: 25 mg via ORAL

## 2014-12-06 MED ORDER — SODIUM CHLORIDE 0.9 % IJ SOLN
10.0000 mL | INTRAMUSCULAR | Status: DC | PRN
Start: 2014-12-06 — End: 2014-12-06
  Administered 2014-12-06: 10 mL via INTRAVENOUS
  Filled 2014-12-06: qty 10

## 2014-12-06 MED ORDER — ACETAMINOPHEN 325 MG PO TABS
ORAL_TABLET | ORAL | Status: AC
  Filled 2014-12-06: qty 2

## 2014-12-06 MED ORDER — HEPARIN SOD (PORK) LOCK FLUSH 100 UNIT/ML IV SOLN
500.0000 [IU] | Freq: Once | INTRAVENOUS | Status: AC
  Administered 2014-12-06: 500 [IU] via INTRAVENOUS
  Filled 2014-12-06: qty 5

## 2014-12-06 MED ORDER — HEPARIN SOD (PORK) LOCK FLUSH 100 UNIT/ML IV SOLN
250.0000 [IU] | INTRAVENOUS | Status: DC | PRN
  Filled 2014-12-06: qty 5

## 2014-12-06 MED ORDER — SODIUM CHLORIDE 0.9 % IV SOLN
250.0000 mL | Freq: Once | INTRAVENOUS | Status: AC
  Administered 2014-12-06: 250 mL via INTRAVENOUS

## 2014-12-06 MED ORDER — ACETAMINOPHEN 325 MG PO TABS
650.0000 mg | ORAL_TABLET | Freq: Once | ORAL | Status: AC
  Administered 2014-12-06: 650 mg via ORAL

## 2014-12-06 NOTE — Assessment & Plan Note (Signed)
Patient received her last cycle of cisplatin/Camptostar chemotherapy on 11/29/2014.  Patient is scheduled for restaging CT scan on 12/11/2014.  She is scheduled to return for labs, follow up visit to review scan results, and her next cycle of chemotherapy on 12/13/2014.

## 2014-12-06 NOTE — Assessment & Plan Note (Signed)
Patient continues to complain of intermittent diarrhea since receiving her last chemotherapy.  Patient has been alternating both Imodium and Lomotil with minimal effectiveness.  Stool for C. Difficile was negative. Pt will receive approximately 500 ML's normal saline IV fluid rehydration today.  She was also encouraged to push fluids at home as much as possible.

## 2014-12-06 NOTE — Assessment & Plan Note (Signed)
Blood pressure was low at 95/52 on initial check.  Care the [facility] today.  Patient typically has a history of hypertension; and states that she did not take any of her blood pressure medications today.  Patient does admit to feeling fairly dehydrated today.  Careful review of patient's medication list reveal the patient has been prescribed both metoprolol and HCTZ per her primary care provider Dr.Moreira.  Called and spoke to Dr.Moreira's nurse; and was advised that patient should discontinue the HCTZ altogether.  Patient should continue with the metoprolol starting tomorrow 12/07/2014.  Advised that patient follow up with her permit her provider regarding her blood pressure medications.  She may very well need further adjustment of her blood pressure medications since she is actively receiving chemotherapy now.  Also, advised patient to recheck her blood pressure when she is at home as well.

## 2014-12-06 NOTE — Patient Instructions (Signed)
Dehydration, Adult Dehydration is when you lose more fluids from the body than you take in. Vital organs like the kidneys, brain, and heart cannot function without a proper amount of fluids and salt. Any loss of fluids from the body can cause dehydration.  CAUSES   Vomiting.  Diarrhea.  Excessive sweating.  Excessive urine output.  Fever. SYMPTOMS  Mild dehydration  Thirst.  Dry lips.  Slightly dry mouth. Moderate dehydration  Very dry mouth.  Sunken eyes.  Skin does not bounce back quickly when lightly pinched and released.  Dark urine and decreased urine production.  Decreased tear production.  Headache. Severe dehydration  Very dry mouth.  Extreme thirst.  Rapid, weak pulse (more than 100 beats per minute at rest).  Cold hands and feet.  Not able to sweat in spite of heat and temperature.  Rapid breathing.  Blue lips.  Confusion and lethargy.  Difficulty being awakened.  Minimal urine production.  No tears. DIAGNOSIS  Your caregiver will diagnose dehydration based on your symptoms and your exam. Blood and urine tests will help confirm the diagnosis. The diagnostic evaluation should also identify the cause of dehydration. TREATMENT  Treatment of mild or moderate dehydration can often be done at home by increasing the amount of fluids that you drink. It is best to drink small amounts of fluid more often. Drinking too much at one time can make vomiting worse. Refer to the home care instructions below. Severe dehydration needs to be treated at the hospital where you will probably be given intravenous (IV) fluids that contain water and electrolytes. HOME CARE INSTRUCTIONS   Ask your caregiver about specific rehydration instructions.  Drink enough fluids to keep your urine clear or pale yellow.  Drink small amounts frequently if you have nausea and vomiting.  Eat as you normally do.  Avoid:  Foods or drinks high in sugar.  Carbonated  drinks.  Juice.  Extremely hot or cold fluids.  Drinks with caffeine.  Fatty, greasy foods.  Alcohol.  Tobacco.  Overeating.  Gelatin desserts.  Wash your hands well to avoid spreading bacteria and viruses.  Only take over-the-counter or prescription medicines for pain, discomfort, or fever as directed by your caregiver.  Ask your caregiver if you should continue all prescribed and over-the-counter medicines.  Keep all follow-up appointments with your caregiver. SEEK MEDICAL CARE IF:  You have abdominal pain and it increases or stays in one area (localizes).  You have a rash, stiff neck, or severe headache.  You are irritable, sleepy, or difficult to awaken.  You are weak, dizzy, or extremely thirsty. SEEK IMMEDIATE MEDICAL CARE IF:   You are unable to keep fluids down or you get worse despite treatment.  You have frequent episodes of vomiting or diarrhea.  You have blood or green matter (bile) in your vomit.  You have blood in your stool or your stool looks black and tarry.  You have not urinated in 6 to 8 hours, or you have only urinated a small amount of very dark urine.  You have a fever.  You faint. MAKE SURE YOU:   Understand these instructions.  Will watch your condition.  Will get help right away if you are not doing well or get worse. Document Released: 05/05/2005 Document Revised: 07/28/2011 Document Reviewed: 12/23/2010 [employer] Patient Information 2015 [employer]. This information is not intended to replace advice given to you by your health care provider. Make sure you discuss any questions you have with your health care   provider.  

## 2014-12-06 NOTE — Progress Notes (Signed)
HAR called in

## 2014-12-06 NOTE — Progress Notes (Signed)
SYMPTOM MANAGEMENT CLINIC   HPI: [name] 74 y.o. female diagnosed with lung cancer; with metastasis to the bone.  Currently undergoing cisplatin/Camptosar chemotherapy regimen.  Patient presented to the [facility] today with complaint of progressive fatigue and generalized weakness.  She has minimal appetite and poor oral intake.    she has had one episode of diarrhea daily for the past 2 days.  She continues with chronic nausea; but no vomiting.  She denies any recent fevers or chills.  HPI  ROS  Past Medical History  Diagnosis Date  . GERD (gastroesophageal reflux disease)   . IBS (irritable bowel syndrome)   . Adenomatous colon polyp 06/2009  . Hiatal hernia   . Breast cyst   . Diverticulosis   . Hemorrhoids   . Hyperlipidemia   . Hypertension   . Hyperparathyroidism   . Anxiety   . Migraine   . Osteoporosis   . Vitamin D deficiency   . Spinal stenosis   . C. difficile colitis 2006  . Small cell lung carcinoma dx'd 02/2014  . Bone metastases dx'd 02/2014    Past Surgical History  Procedure Laterality Date  . Cholecystectomy    . Vaginal hysterectomy    . Knee arthroscopy      left  . Parathyroidectomy  2009    [facility]  . Lumbar spine surgery  2001  . Breast biopsy      bilateral  . Total knee arthroplasty      right  . Bladder repair    . Video bronchoscopy Bilateral 03/14/2014    Procedure: VIDEO BRONCHOSCOPY WITHOUT FLUORO;  Surgeon: Tanda Rockers, MD;  Location: [facility];  Service: Cardiopulmonary;  Laterality: Bilateral;    has GERD; IRRITABLE BOWEL SYNDROME; CHEST PAIN; ABDOMINAL PAIN-EPIGASTRIC; NONSPEC ELEVATION OF LEVELS OF TRANSAMINASE/LDH; PERSONAL HX COLONIC POLYPS; Lung mass; Acute mid back pain secondary to lytic lesion T8  03/02/14; Small cell carcinoma of lung; Nausea with vomiting; Dehydration; Diarrhea; Hypokalemia; Thrombocytopenia; Neoplasm related pain; Tremor; Bone metastasis; Hyperbilirubinemia; Hyperphosphatemia;  Anemia in neoplastic disease; Hypocalcemia; Other pancytopenia; Pancytopenia; Neutropenia; Lung cancer; Acute pain; Protein-calorie malnutrition, severe; Weakness; Antineoplastic chemotherapy induced pancytopenia; and Hypotension on her problem list.    is allergic to ace inhibitors; cephalexin; etodolac; and gabapentin.    Medication List       This list is accurate as of: 12/06/14  5:19 PM.  Always use your most recent med list.               ALPRAZolam 0.5 MG tablet  Commonly known as:  XANAX  Take 0.5 mg by mouth at bedtime.     atorvastatin 80 MG tablet  Commonly known as:  LIPITOR  Take 80 mg by mouth every morning.     buPROPion 150 MG 12 hr tablet  Commonly known as:  WELLBUTRIN SR  Take 150 mg by mouth every morning.     CALCIUM PO  Take 1 tablet by mouth 3 (three) times daily. Starting 06/21/14--taking 649m three times daily     carbidopa-levodopa 25-100 MG per tablet  Commonly known as:  SINEMET IR  Take 1 tablet by mouth 3 (three) times daily.     DEXILANT 60 MG capsule  Generic drug:  dexlansoprazole  TAKE ONE CAPSULE BY MOUTH DAILY     diphenoxylate-atropine 2.5-0.025 MG per tablet  Commonly known as:  LOMOTIL  TAKE 2 TABLETS BY MOUTH 4 TIMES DAILY AS NEEDED FOR DIARRHEA OR LOOSE STOOLS  feeding supplement (ENSURE COMPLETE) Liqd  Take 237 mLs by mouth 2 (two) times daily between meals.     glycopyrrolate 2 MG tablet  Commonly known as:  ROBINUL  TAKE 1 TABLET (2 MG TOTAL) BY MOUTH 2 (TWO) TIMES DAILY.     lidocaine-prilocaine cream  Commonly known as:  EMLA  Apply 1 application topically as needed. Apply to port 1 hr before chemo     LORazepam 0.5 MG tablet  Commonly known as:  ATIVAN  Take 1 tablet (0.5 mg total) by mouth every 8 (eight) hours.     magnesium oxide 400 (241.3 MG) MG tablet  Commonly known as:  MAG-OX  Take 1 tablet (400 mg total) by mouth 4 (four) times daily.     metoprolol succinate 50 MG 24 hr tablet  Commonly known as:   TOPROL-XL  Take 50 mg by mouth every morning.     MIRALAX PO  Take 1 scoop by mouth daily as needed (for constipation).     ondansetron 8 MG tablet  Commonly known as:  ZOFRAN  Take 1 tablet (8 mg total) by mouth every 8 (eight) hours as needed for nausea or vomiting.     oxyCODONE-acetaminophen 5-325 MG per tablet  Commonly known as:  PERCOCET/ROXICET  Take 1 tablet by mouth every 6 (six) hours as needed for severe pain.     potassium chloride SA 20 MEQ tablet  Commonly known as:  K-DUR,KLOR-CON  Take 1 tablet (20 mEq total) by mouth 2 (two) times daily.     prochlorperazine 10 MG tablet  Commonly known as:  COMPAZINE  Take 1 tablet (10 mg total) by mouth every 6 (six) hours as needed for nausea or vomiting.     rOPINIRole 1 MG tablet  Commonly known as:  REQUIP  TAKE 1 TABLET (1 MG TOTAL) BY MOUTH 3 (THREE) TIMES DAILY.     ZORVOLEX 35 MG Caps  Generic drug:  Diclofenac  Take 35 mg by mouth every morning.         PHYSICAL EXAMINATION  Oncology Vitals 12/06/2014 12/06/2014 12/06/2014 12/06/2014 12/06/2014 11/29/2014 11/16/2014  Height - - - - - - -  Weight - - - - - - -  Weight (lbs) - - - - - - -  BMI (kg/m2) - - - - - - -  Temp 98.3 97.7 97.6 97.7 97.9 98.2 98.4  Pulse 73 78 76 76 79 76 86  Resp 20 17 18 18 16 17  -  SpO2 - 100 100 100 100 100 -  BSA (m2) - - - - - - -   BP Readings from Last 3 Encounters:  12/06/14 127/45  12/06/14 95/52  11/29/14 144/69    Physical Exam  Constitutional: She is oriented to person, place, and time.  Patient appears fatigued, weak, frail, and chronically ill.  HENT:  Head: Normocephalic and atraumatic.  Mouth/Throat: Oropharynx is clear and moist.  Eyes: Conjunctivae and EOM are normal. Pupils are equal, round, and reactive to light. Right eye exhibits no discharge. Left eye exhibits no discharge. No scleral icterus.  Neck: Normal range of motion. Neck supple. No JVD present. No tracheal deviation present. No thyromegaly present.    Cardiovascular: Normal rate, regular rhythm, normal heart sounds and intact distal pulses.   Pulmonary/Chest: Effort normal and breath sounds normal. No respiratory distress. She has no wheezes. She has no rales. She exhibits no tenderness.  Abdominal: Soft. Bowel sounds are normal. She exhibits no distension and no  mass. There is no tenderness. There is no rebound and no guarding.  Musculoskeletal: Normal range of motion. She exhibits no edema or tenderness.  Lymphadenopathy:    She has no cervical adenopathy.  Neurological: She is alert and oriented to person, place, and time.  Patient in wheelchair during exam.  Skin: Skin is warm and dry. No rash noted. No erythema. There is pallor.  Psychiatric: Affect normal.  Nursing note and vitals reviewed.   LABORATORY DATA:. Hospital Outpatient Visit on 12/06/2014  Component Date Value Ref Range Status  . Order Confirmation 12/06/2014 ORDER PROCESSED BY BLOOD BANK   Final  . ABO/RH(D) 12/06/2014 O POS   Final  . Antibody Screen 12/06/2014 NEG   Final  . Sample Expiration 12/06/2014 12/09/2014   Final  . Unit Number 12/06/2014 [id number]   Final  . Blood Component Type 12/06/2014 RED CELLS,LR   Final  . Unit division 12/06/2014 00   Final  . Status of Unit 12/06/2014 ISSUED   Final  . Transfusion Status 12/06/2014 OK TO TRANSFUSE   Final  . Crossmatch Result 12/06/2014 Compatible   Final  . Unit Number 12/06/2014 [id number]   Final  . Blood Component Type 12/06/2014 RBC LR PHER2   Final  . Unit division 12/06/2014 00   Final  . Status of Unit 12/06/2014 ISSUED   Final  . Transfusion Status 12/06/2014 OK TO TRANSFUSE   Final  . Crossmatch Result 12/06/2014 Compatible   Final  Office Visit on 12/06/2014  Component Date Value Ref Range Status  . Hold Tube, Blood Bank 12/06/2014 Type and Crossmatch Added   Final  Appointment on 12/06/2014  Component Date Value Ref Range Status  . WBC 12/06/2014 2.9* 3.9 - 10.3 10e3/uL Final  .  NEUT# 12/06/2014 1.6  1.5 - 6.5 10e3/uL Final  . HGB 12/06/2014 8.3* 11.6 - 15.9 g/dL Final  . HCT 12/06/2014 24.6* 34.8 - 46.6 % Final  . Platelets 12/06/2014 182  145 - 400 10e3/uL Final  . MCV 12/06/2014 87.9  79.5 - 101.0 fL Final  . MCH 12/06/2014 29.6  25.1 - 34.0 pg Final  . MCHC 12/06/2014 33.7  31.5 - 36.0 g/dL Final  . RBC 12/06/2014 2.80* 3.70 - 5.45 10e6/uL Final  . RDW 12/06/2014 14.8* 11.2 - 14.5 % Final  . lymph# 12/06/2014 1.0  0.9 - 3.3 10e3/uL Final  . MONO# 12/06/2014 0.3  0.1 - 0.9 10e3/uL Final  . Eosinophils Absolute 12/06/2014 0.0  0.0 - 0.5 10e3/uL Final  . Basophils Absolute 12/06/2014 0.0  0.0 - 0.1 10e3/uL Final  . NEUT% 12/06/2014 54.5  38.4 - 76.8 % Final  . LYMPH% 12/06/2014 34.2  14.0 - 49.7 % Final  . MONO% 12/06/2014 9.6  0.0 - 14.0 % Final  . EOS% 12/06/2014 1.0  0.0 - 7.0 % Final  . BASO% 12/06/2014 0.7  0.0 - 2.0 % Final  . Sodium 12/06/2014 131* 136 - 145 mEq/L Final  . Potassium 12/06/2014 4.8  3.5 - 5.1 mEq/L Final  . Chloride 12/06/2014 95* 98 - 109 mEq/L Final  . CO2 12/06/2014 29  22 - 29 mEq/L Final  . Glucose 12/06/2014 96  70 - 140 mg/dl Final  . BUN 12/06/2014 19.5  7.0 - 26.0 mg/dL Final  . Creatinine 12/06/2014 0.9  0.6 - 1.1 mg/dL Final  . Total Bilirubin 12/06/2014 0.93  0.20 - 1.20 mg/dL Final  . Alkaline Phosphatase 12/06/2014 77  40 - 150 U/L Final  . AST 12/06/2014 14  5 - 34 U/L Final  . ALT 12/06/2014 8  0 - 55 U/L Final  . Total Protein 12/06/2014 6.7  6.4 - 8.3 g/dL Final  . Albumin 12/06/2014 3.6  3.5 - 5.0 g/dL Final  . Calcium 12/06/2014 9.2  8.4 - 10.4 mg/dL Final  . Anion Gap 12/06/2014 7  3 - 11 mEq/L Final  . EGFR 12/06/2014 62* >90 ml/min/1.73 m2 Final   eGFR is calculated using the CKD-EPI Creatinine Equation (2009)  . Magnesium 12/06/2014 2.0  1.5 - 2.5 mg/dl Final     RADIOGRAPHIC STUDIES: No results found.  ASSESSMENT/PLAN:    Antineoplastic chemotherapy induced pancytopenia Hemoglobin down to 8.3 today.   Patient does feel fairly symptomatic with her anemia today; complaining of increased fatigue and weakness.  Patient will receive 2 units packed red blood cell transfusion today.  We'll continue to monitor closely.  Dehydration Patient continues to complain of intermittent diarrhea since receiving her last chemotherapy.  Patient has been alternating both Imodium and Lomotil with minimal effectiveness.  Stool for C. Difficile was negative. Pt will receive approximately 500 ML's normal saline IV fluid rehydration today.  She was also encouraged to push fluids at home as much as possible.    Hypotension Blood pressure was low at 95/52 on initial check.  Care the [facility] today.  Patient typically has a history of hypertension; and states that she did not take any of her blood pressure medications today.  Patient does admit to feeling fairly dehydrated today.  Careful review of patient's medication list reveal the patient has been prescribed both metoprolol and HCTZ per her primary care provider Dr.Moreira.  Called and spoke to Dr.Moreira's nurse; and was advised that patient should discontinue the HCTZ altogether.  Patient should continue with the metoprolol starting tomorrow 12/07/2014.  Advised that patient follow up with her permit her provider regarding her blood pressure medications.  She may very well need further adjustment of her blood pressure medications since she is actively receiving chemotherapy now.  Also, advised patient to recheck her blood pressure when she is at home as well.  Nausea with vomiting Patient continues to complain of some chronic nausea; but no vomiting.  She states she has been taking her antinausea medications at home with only moderate effectiveness.  Patient does appear dehydrated today and will receive IV fluid rehydration while at the cancer center.       Small cell carcinoma of lung Patient received her last cycle of cisplatin/Camptostar chemotherapy  on 11/29/2014.  Patient is scheduled for restaging CT scan on 12/11/2014.  She is scheduled to return for labs, follow up visit to review scan results, and her next cycle of chemotherapy on 12/13/2014.       Weakness Patient is complaining of increased fatigue/weakness today.  Most likely, this is due to chemotherapy side effect, dehydration, and progressive anemia.  Hopefully, both IV fluid rehydration and blood transfusion with palpation to feel better.  Patient stated understanding of all instructions; and was in agreement with this plan of care. The patient knows to call the clinic with any problems, questions or concerns.   This was a shared visit with Dr. Julien Nordmann today.  Total time spent with patient was 40 miinutes;  with greater tha75 percent of that time spent in face to face counseling regarding patient's symptoms,  and coordination of care and follow up.  Disclaimer: This note was dictated with voice recognition software. Similar sounding words can inadvertently be transcribed and may  not be corrected upon review.   Drue Second, NP 12/06/2014   ADDENDUM: Hematology/Oncology Attending: I had a face to face encounter with the patient. I recommended her care plan. This is a very pleasant 74 years old white female with extensive stage small cell lung cancer and evidence for disease progression after the first line therapy. She is currently undergoing second line systemic chemotherapy with reduced dose cisplatin and irinotecan. The patient has rough time tolerating her treatment because of the pancytopenia as well as multiple electrolyte abnormalities. She feels much better today. I recommended for the patient to proceed with her second cycle today as a scheduled but we will reduce the dose of cisplatin to 20 MG/M2 and irinotecan to 45 MG/M2 on days 1 and 8 every 3 weeks. The patient would come back for follow-up visit in 3 weeks for reevaluation after repeating CT scan of the  chest, abdomen and pelvis for restaging of her disease before starting cycle #3. For the chemotherapy-induced anemia, will arrange for the patient to receive 2 units of PRBCs transfusion. The patient would also receive IV hydration for the dehydration. She was advised to call immediately if she has any concerning symptoms in the interval.  Disclaimer: This note was dictated with voice recognition software. Similar sounding words can inadvertently be transcribed and may be missed upon review. Eilleen Kempf., MD 12/10/2014

## 2014-12-06 NOTE — Assessment & Plan Note (Signed)
Hemoglobin down to 8.3 today.  Patient does feel fairly symptomatic with her anemia today; complaining of increased fatigue and weakness.  Patient will receive 2 units packed red blood cell transfusion today.  We'll continue to monitor closely.

## 2014-12-06 NOTE — Progress Notes (Signed)
Pt in flush room with complaints of diarrhea starting yesterday morning. Pt has not had much of an appetite. States she has not eaten since Monday mid day. Pt request to be seen by [facility].  POF put in for Cyndee Bacon to see pt.

## 2014-12-06 NOTE — Assessment & Plan Note (Signed)
Patient continues to complain of some chronic nausea; but no vomiting.  She states she has been taking her antinausea medications at home with only moderate effectiveness.  Patient does appear dehydrated today and will receive IV fluid rehydration while at the cancer center.

## 2014-12-06 NOTE — Patient Instructions (Signed)

## 2014-12-06 NOTE — Assessment & Plan Note (Signed)
Patient is complaining of increased fatigue/weakness today.  Most likely, this is due to chemotherapy side effect, dehydration, and progressive anemia.  Hopefully, both IV fluid rehydration and blood transfusion with palpation to feel better.

## 2014-12-06 NOTE — Patient Instructions (Signed)

## 2014-12-07 ENCOUNTER — Telehealth: Payer: Self-pay

## 2014-12-07 ENCOUNTER — Telehealth: Payer: Self-pay | Admitting: *Deleted

## 2014-12-07 LAB — TYPE AND SCREEN
ABO/RH(D): O POS
Antibody Screen: NEGATIVE
UNIT DIVISION: 0
Unit division: 0

## 2014-12-07 NOTE — Telephone Encounter (Signed)
INFORMED PT. SCHEDULING TO CALL HER TODAY WITH THE IV FLUIDS APPOINTMENT FOR TOMORROW.

## 2014-12-07 NOTE — Telephone Encounter (Signed)
Called to follow up with pt after [facility] visit yesterday. Spoke with pt husband who states she is still sleeping but had a better night last night. Informed Mr.Soliday scheduling will be in contact with them today to schedule the IV fluids for tomorrow, and if she felt like she didn't need them tomorrow morning she could call and cancel. Mr. Car verbalized understanding and denies any other questions or concerns.

## 2014-12-08 ENCOUNTER — Telehealth: Payer: Self-pay | Admitting: Internal Medicine

## 2014-12-08 ENCOUNTER — Telehealth: Payer: Self-pay | Admitting: *Deleted

## 2014-12-08 NOTE — Telephone Encounter (Signed)
Returned patient call this morning re IVF's and per patient she is feeling better and does not think she needs IVF's. Informed desk nurse (CB) who was also in the process of speaking with patient re IVF's.

## 2014-12-08 NOTE — Telephone Encounter (Signed)
Spoke to pt- she states she does not need fluids today. She has a CT scan on Monday and will check back in with [place] then. She has declined fluids today.

## 2014-12-11 ENCOUNTER — Encounter (HOSPITAL_COMMUNITY): Payer: Self-pay

## 2014-12-11 ENCOUNTER — Ambulatory Visit (HOSPITAL_COMMUNITY)
Admission: RE | Admit: 2014-12-11 | Discharge: 2014-12-11 | Disposition: A | Discharge status: Home / Self Care | Payer: Medicare Other | Source: Ambulatory Visit | Attending: Physician Assistant | Admitting: Physician Assistant

## 2014-12-11 DIAGNOSIS — R918 Other nonspecific abnormal finding of lung field: Secondary | ICD-10-CM | POA: Diagnosis not present

## 2014-12-11 DIAGNOSIS — C3490 Malignant neoplasm of unspecified part of unspecified bronchus or lung: Secondary | ICD-10-CM | POA: Diagnosis not present

## 2014-12-11 DIAGNOSIS — R590 Localized enlarged lymph nodes: Secondary | ICD-10-CM | POA: Diagnosis not present

## 2014-12-11 DIAGNOSIS — Z08 Encounter for follow-up examination after completed treatment for malignant neoplasm: Secondary | ICD-10-CM | POA: Diagnosis not present

## 2014-12-11 DIAGNOSIS — C7951 Secondary malignant neoplasm of bone: Secondary | ICD-10-CM | POA: Diagnosis not present

## 2014-12-11 MED ORDER — IOHEXOL 300 MG/ML  SOLN
100.0000 mL | Freq: Once | INTRAMUSCULAR | Status: AC | PRN
  Administered 2014-12-11: 100 mL via INTRAVENOUS

## 2014-12-12 ENCOUNTER — Other Ambulatory Visit: Payer: Self-pay | Admitting: Medical Oncology

## 2014-12-12 DIAGNOSIS — C3490 Malignant neoplasm of unspecified part of unspecified bronchus or lung: Secondary | ICD-10-CM

## 2014-12-13 ENCOUNTER — Ambulatory Visit (HOSPITAL_BASED_OUTPATIENT_CLINIC_OR_DEPARTMENT_OTHER): Payer: Medicare Other | Admitting: Nurse Practitioner

## 2014-12-13 ENCOUNTER — Telehealth: Payer: Self-pay | Admitting: Nurse Practitioner

## 2014-12-13 ENCOUNTER — Other Ambulatory Visit: Payer: Self-pay | Admitting: *Deleted

## 2014-12-13 ENCOUNTER — Other Ambulatory Visit (HOSPITAL_BASED_OUTPATIENT_CLINIC_OR_DEPARTMENT_OTHER): Payer: Medicare Other

## 2014-12-13 ENCOUNTER — Ambulatory Visit: Payer: Medicare Other

## 2014-12-13 ENCOUNTER — Ambulatory Visit (HOSPITAL_BASED_OUTPATIENT_CLINIC_OR_DEPARTMENT_OTHER): Payer: Medicare Other

## 2014-12-13 ENCOUNTER — Encounter: Payer: Self-pay | Admitting: Nurse Practitioner

## 2014-12-13 VITALS — BP 142/77 | HR 73 | Temp 98.0°F | Resp 18

## 2014-12-13 VITALS — BP 98/46 | HR 81 | Temp 97.9°F | Resp 18 | Ht 65.0 in | Wt 118.9 lb

## 2014-12-13 DIAGNOSIS — E876 Hypokalemia: Secondary | ICD-10-CM | POA: Diagnosis not present

## 2014-12-13 DIAGNOSIS — Z5111 Encounter for antineoplastic chemotherapy: Secondary | ICD-10-CM

## 2014-12-13 DIAGNOSIS — C3491 Malignant neoplasm of unspecified part of right bronchus or lung: Secondary | ICD-10-CM

## 2014-12-13 DIAGNOSIS — C3490 Malignant neoplasm of unspecified part of unspecified bronchus or lung: Secondary | ICD-10-CM

## 2014-12-13 DIAGNOSIS — C7951 Secondary malignant neoplasm of bone: Secondary | ICD-10-CM | POA: Diagnosis not present

## 2014-12-13 DIAGNOSIS — D6481 Anemia due to antineoplastic chemotherapy: Secondary | ICD-10-CM | POA: Diagnosis not present

## 2014-12-13 DIAGNOSIS — R110 Nausea: Secondary | ICD-10-CM

## 2014-12-13 DIAGNOSIS — Z95828 Presence of other vascular implants and grafts: Secondary | ICD-10-CM

## 2014-12-13 LAB — CBC WITH DIFFERENTIAL/PLATELET
BASO%: 0.3 % (ref 0.0–2.0)
Basophils Absolute: 0 10*3/uL (ref 0.0–0.1)
EOS%: 1 % (ref 0.0–7.0)
Eosinophils Absolute: 0 10*3/uL (ref 0.0–0.5)
HCT: 31.7 % — ABNORMAL LOW (ref 34.8–46.6)
HGB: 10.9 g/dL — ABNORMAL LOW (ref 11.6–15.9)
LYMPH%: 22.9 % (ref 14.0–49.7)
MCH: 29.9 pg (ref 25.1–34.0)
MCHC: 34.4 g/dL (ref 31.5–36.0)
MCV: 87.1 fL (ref 79.5–101.0)
MONO#: 0.5 10*3/uL (ref 0.1–0.9)
MONO%: 12.3 % (ref 0.0–14.0)
NEUT%: 63.5 % (ref 38.4–76.8)
NEUTROS ABS: 2.5 10*3/uL (ref 1.5–6.5)
Platelets: 106 10*3/uL — ABNORMAL LOW (ref 145–400)
RBC: 3.64 10*6/uL — AB (ref 3.70–5.45)
RDW: 15.6 % — ABNORMAL HIGH (ref 11.2–14.5)
WBC: 4 10*3/uL (ref 3.9–10.3)
lymph#: 0.9 10*3/uL (ref 0.9–3.3)

## 2014-12-13 LAB — COMPREHENSIVE METABOLIC PANEL (CC13)
ALT: 8 U/L (ref 0–55)
AST: 11 U/L (ref 5–34)
Albumin: 3.5 g/dL (ref 3.5–5.0)
Alkaline Phosphatase: 92 U/L (ref 40–150)
Anion Gap: 9 mEq/L (ref 3–11)
BUN: 16.6 mg/dL (ref 7.0–26.0)
CO2: 25 mEq/L (ref 22–29)
CREATININE: 0.8 mg/dL (ref 0.6–1.1)
Calcium: 8.3 mg/dL — ABNORMAL LOW (ref 8.4–10.4)
Chloride: 93 mEq/L — ABNORMAL LOW (ref 98–109)
EGFR: 74 mL/min/{1.73_m2} — AB (ref >90)
Glucose: 85 mg/dl (ref 70–140)
POTASSIUM: 4.4 meq/L (ref 3.5–5.1)
Sodium: 127 mEq/L — ABNORMAL LOW (ref 136–145)
Total Bilirubin: 0.61 mg/dL (ref 0.20–1.20)
Total Protein: 6.3 g/dL — ABNORMAL LOW (ref 6.4–8.3)

## 2014-12-13 LAB — MAGNESIUM (CC13): Magnesium: 1.6 mg/dl (ref 1.5–2.5)

## 2014-12-13 MED ORDER — PALONOSETRON HCL INJECTION 0.25 MG/5ML
0.2500 mg | Freq: Once | INTRAVENOUS | Status: AC
  Administered 2014-12-13: 0.25 mg via INTRAVENOUS

## 2014-12-13 MED ORDER — ATROPINE SULFATE 1 MG/ML IJ SOLN
0.5000 mg | Freq: Once | INTRAMUSCULAR | Status: AC | PRN
  Administered 2014-12-13: 0.5 mg via INTRAVENOUS

## 2014-12-13 MED ORDER — SODIUM CHLORIDE 0.9 % IV SOLN
20.0000 mg/m2 | Freq: Once | INTRAVENOUS | Status: AC
  Administered 2014-12-13: 33 mg via INTRAVENOUS
  Filled 2014-12-13: qty 33

## 2014-12-13 MED ORDER — SODIUM CHLORIDE 0.9 % IV SOLN
Freq: Once | INTRAVENOUS | Status: AC
  Administered 2014-12-13: 10:00:00 via INTRAVENOUS

## 2014-12-13 MED ORDER — PROCHLORPERAZINE MALEATE 10 MG PO TABS: 10.0000 mg | ORAL_TABLET | Freq: Four times a day (QID) | ORAL | Status: AC | PRN

## 2014-12-13 MED ORDER — PALONOSETRON HCL INJECTION 0.25 MG/5ML
INTRAVENOUS | Status: AC
  Filled 2014-12-13: qty 5

## 2014-12-13 MED ORDER — FOSAPREPITANT DIMEGLUMINE INJECTION 150 MG
Freq: Once | INTRAVENOUS | Status: AC
  Administered 2014-12-13: 13:00:00 via INTRAVENOUS
  Filled 2014-12-13: qty 5

## 2014-12-13 MED ORDER — ATROPINE SULFATE 1 MG/ML IJ SOLN
INTRAMUSCULAR | Status: AC
  Filled 2014-12-13: qty 1

## 2014-12-13 MED ORDER — HEPARIN SOD (PORK) LOCK FLUSH 100 UNIT/ML IV SOLN
500.0000 [IU] | Freq: Once | INTRAVENOUS | Status: AC | PRN
  Administered 2014-12-13: 500 [IU]
  Filled 2014-12-13: qty 5

## 2014-12-13 MED ORDER — POTASSIUM CHLORIDE 2 MEQ/ML IV SOLN
Freq: Once | INTRAVENOUS | Status: AC
  Administered 2014-12-13: 11:00:00 via INTRAVENOUS
  Filled 2014-12-13: qty 10

## 2014-12-13 MED ORDER — SODIUM CHLORIDE 0.9 % IJ SOLN
10.0000 mL | INTRAMUSCULAR | Status: DC | PRN
  Administered 2014-12-13: 10 mL via INTRAVENOUS
  Filled 2014-12-13: qty 10

## 2014-12-13 MED ORDER — SODIUM CHLORIDE 0.9 % IJ SOLN
10.0000 mL | INTRAMUSCULAR | Status: DC | PRN
  Administered 2014-12-13: 10 mL
  Filled 2014-12-13: qty 10

## 2014-12-13 MED ORDER — IRINOTECAN HCL CHEMO INJECTION 100 MG/5ML
45.0000 mg/m2 | Freq: Once | INTRAVENOUS | Status: AC
  Administered 2014-12-13: 74 mg via INTRAVENOUS
  Filled 2014-12-13: qty 3.7

## 2014-12-13 NOTE — Patient Instructions (Signed)
[facility] Discharge Instructions for Patients Receiving Chemotherapy  Today you received the following chemotherapy agents: Cisplatin/Irinotecan  To help prevent nausea and vomiting after your treatment, we encourage you to take your nausea medication:Compazine 10 mg every 6 hours as needed, Zofran 8 mg every 8  Hours as needed.   If you develop nausea and vomiting that is not controlled by your nausea medication, call the clinic.   BELOW ARE SYMPTOMS THAT SHOULD BE REPORTED IMMEDIATELY:  *FEVER GREATER THAN 100.5 F  *CHILLS WITH OR WITHOUT FEVER  NAUSEA AND VOMITING THAT IS NOT CONTROLLED WITH YOUR NAUSEA MEDICATION  *UNUSUAL SHORTNESS OF BREATH  *UNUSUAL BRUISING OR BLEEDING  TENDERNESS IN MOUTH AND THROAT WITH OR WITHOUT PRESENCE OF ULCERS  *URINARY PROBLEMS  *BOWEL PROBLEMS  UNUSUAL RASH Items with * indicate a potential emergency and should be followed up as soon as possible.  Feel free to call the clinic you have any questions or concerns. The clinic phone number is [phone].  Please show the [facility] at check-in to the Emergency Department and triage nurse.

## 2014-12-13 NOTE — Progress Notes (Addendum)
[facility] [phone]   [phone]  OFFICE PROGRESS NOTE  [name], MD [address] [place] [address]  DIAGNOSIS: Extensive stage, stage IV (T3, N2, M1b) small cell lung cancer diagnosed in October 2015.  PRIOR THERAPY: Systemic chemotherapy with carboplatin for AUC of 5 on day 1 and etoposide 100 MG/M2 on days 1, 2 and 3 with Neulasta support on day 4. Status post 6 cycles. Starting from cycle #5 carboplatin with for AUC of 4 and etoposide 80 MG/M2  CURRENT THERAPY: The patient restarted systemic chemotherapy with cisplatin 25 mg meter squared and irinotecan 65 mg meter squared given day 1 and day 8 of each cycle. She started this on 10/25/2014. This was reduced to cisplatin 20 MG/M2 and irinotecan '45MG'$ /M2 starting with the beginning of cycle #2. She is status post 2 cycle.  INTERVAL HISTORY: [name] 74 y.o. female returns to the clinic today for follow-up visit accompanied by her husband and daughter. The interval history is remarkable for presenting to the symptom management clinic with complaints of fatigue and weakness. Her appetite was down and she experienced her usual diarrhea that leads to dehydration. She was given IV fluids and 2 units of blood as she was also anemic at the time. Her diarrhea has since resolved. She has occasional nausea that she manages with compazine. She has occasional back pain and she manages it with narcotics. She denies fevers or chills. She has shortness of breath with exertion, but denies chest pain, cough, or palpitations.   MEDICAL HISTORY: Past Medical History  Diagnosis Date  . GERD (gastroesophageal reflux disease)   . IBS (irritable bowel syndrome)   . Adenomatous colon polyp 06/2009  . Hiatal hernia   . Breast cyst   . Diverticulosis   . Hemorrhoids   . Hyperlipidemia   . Hypertension   . Hyperparathyroidism   . Anxiety   . Migraine   . Osteoporosis   . Vitamin D deficiency   .  Spinal stenosis   . C. difficile colitis 2006  . Small cell lung carcinoma dx'd 02/2014  . Bone metastases dx'd 02/2014    ALLERGIES:  is allergic to ace inhibitors; cephalexin; etodolac; and gabapentin.  MEDICATIONS:  Current Outpatient Prescriptions  Medication Sig Dispense Refill  . ALPRAZolam (XANAX) 0.5 MG tablet Take 0.5 mg by mouth at bedtime.      Marland Kitchen atorvastatin (LIPITOR) 80 MG tablet Take 80 mg by mouth every morning.     Marland Kitchen buPROPion (WELLBUTRIN SR) 150 MG 12 hr tablet Take 150 mg by mouth every morning.     Marland Kitchen CALCIUM PO Take 1 tablet by mouth 3 (three) times daily. Starting 06/21/14--taking '600mg'$  three times daily    . carbidopa-levodopa (SINEMET IR) 25-100 MG per tablet Take 1 tablet by mouth 3 (three) times daily. 270 tablet 1  . DEXILANT 60 MG capsule TAKE ONE CAPSULE BY MOUTH DAILY 30 capsule 0  . Diclofenac (ZORVOLEX) 35 MG CAPS Take 35 mg by mouth every morning.     . diphenoxylate-atropine (LOMOTIL) 2.5-0.025 MG per tablet TAKE 2 TABLETS BY MOUTH 4 TIMES DAILY AS NEEDED FOR DIARRHEA OR LOOSE STOOLS 30 tablet 0  . feeding supplement, ENSURE COMPLETE, (ENSURE COMPLETE) LIQD Take 237 mLs by mouth 2 (two) times daily between meals. 237 mL 0  . glycopyrrolate (ROBINUL) 2 MG tablet TAKE 1 TABLET (2 MG TOTAL) BY MOUTH 2 (TWO) TIMES DAILY. 60 tablet 0  . lidocaine-prilocaine (EMLA) cream  Apply 1 application topically as needed. Apply to port 1 hr before chemo 30 g 0  . LORazepam (ATIVAN) 0.5 MG tablet Take 1 tablet (0.5 mg total) by mouth every 8 (eight) hours. 30 tablet 0  . magnesium oxide (MAG-OX) 400 (241.3 MG) MG tablet Take 1 tablet (400 mg total) by mouth 4 (four) times daily. 40 tablet 0  . metoprolol (TOPROL-XL) 50 MG 24 hr tablet Take 50 mg by mouth every morning.     . ondansetron (ZOFRAN) 8 MG tablet Take 1 tablet (8 mg total) by mouth every 8 (eight) hours as needed for nausea or vomiting. 20 tablet 0  . potassium chloride SA (K-DUR,KLOR-CON) 20 MEQ tablet Take 1 tablet  (20 mEq total) by mouth 2 (two) times daily. (Patient taking differently: Take 20 mEq by mouth daily. ) 20 tablet 0  . rOPINIRole (REQUIP) 1 MG tablet TAKE 1 TABLET (1 MG TOTAL) BY MOUTH 3 (THREE) TIMES DAILY. 90 tablet 1  . oxyCODONE-acetaminophen (PERCOCET/ROXICET) 5-325 MG per tablet Take 1 tablet by mouth every 6 (six) hours as needed for severe pain. (Patient not taking: Reported on 12/13/2014) 30 tablet 0  . Polyethylene Glycol 3350 (MIRALAX PO) Take 1 scoop by mouth daily as needed (for constipation).     . prochlorperazine (COMPAZINE) 10 MG tablet Take 1 tablet (10 mg total) by mouth every 6 (six) hours as needed for nausea or vomiting. 30 tablet 1   No current facility-administered medications for this visit.   Facility-Administered Medications Ordered in Other Visits  Medication Dose Route Frequency Provider Last Rate Last Dose  . CISplatin (PLATINOL) 33 mg in sodium chloride 0.9 % 250 mL chemo infusion  20 mg/m2 (Treatment Plan Actual) Intravenous Once Curt Bears, MD 283 mL/hr at 12/13/14 1537 33 mg at 12/13/14 1537  . heparin lock flush 100 unit/mL  500 Units Intracatheter Once PRN Curt Bears, MD      . sodium chloride 0.9 % injection 10 mL  10 mL Intracatheter PRN Curt Bears, MD        SURGICAL HISTORY:  Past Surgical History  Procedure Laterality Date  . Cholecystectomy    . Vaginal hysterectomy    . Knee arthroscopy      left  . Parathyroidectomy  2009    [facility]  . Lumbar spine surgery  2001  . Breast biopsy      bilateral  . Total knee arthroplasty      right  . Bladder repair    . Video bronchoscopy Bilateral 03/14/2014    Procedure: VIDEO BRONCHOSCOPY WITHOUT FLUORO;  Surgeon: Tanda Rockers, MD;  Location: [facility];  Service: Cardiopulmonary;  Laterality: Bilateral;    REVIEW OF SYSTEMS:  Constitutional: positive for anorexia and fatigue Eyes: negative Ears, nose, mouth, throat, and face: negative Respiratory: positive for dyspnea on  exertion Cardiovascular: negative Gastrointestinal: positive for diarrhea and nausea Genitourinary:negative Integument/breast: negative Hematologic/lymphatic: negative Musculoskeletal:positive for back pain Neurological: positive for tremors Behavioral/Psych: negative Endocrine: negative Allergic/Immunologic: negative   PHYSICAL EXAMINATION: General appearance: alert, cooperative, fatigued and no distress Head: Normocephalic, without obvious abnormality, atraumatic Neck: no adenopathy, no JVD, supple, symmetrical, trachea midline and thyroid not enlarged, symmetric, no tenderness/mass/nodules Lymph nodes: Cervical, supraclavicular, and axillary nodes normal. Resp: clear to auscultation bilaterally Back: symmetric, no curvature. ROM normal. No CVA tenderness. Cardio: regular rate and rhythm, S1, S2 normal, no murmur, click, rub or gallop GI: soft, non-tender; bowel sounds normal; no masses,  no organomegaly Extremities: extremities normal, atraumatic, no  cyanosis or edema Neurologic: Alert and oriented X 3, normal strength and tone. Normal symmetric reflexes. Normal coordination and gait  ECOG PERFORMANCE STATUS: 2 - Symptomatic, <50% confined to bed  Blood pressure 98/46, pulse 81, temperature 97.9 F (36.6 C), temperature source Oral, resp. rate 18, height '5\' 5"'$  (1.651 m), weight 118 lb 14.4 oz (53.933 kg), SpO2 100 %.  LABORATORY DATA: Lab Results  Component Value Date   WBC 4.0 12/13/2014   HGB 10.9* 12/13/2014   HCT 31.7* 12/13/2014   MCV 87.1 12/13/2014   PLT 106* 12/13/2014      Chemistry      Component Value Date/Time   NA 127* 12/13/2014 0837   NA 136 05/17/2014 0455   K 4.4 12/13/2014 0837   K 3.4* 05/17/2014 0455   CL 95* 05/17/2014 0455   CO2 25 12/13/2014 0837   CO2 32 05/17/2014 0455   BUN 16.6 12/13/2014 0837   BUN 6 05/17/2014 0455   CREATININE 0.8 12/13/2014 0837   CREATININE 0.78 05/17/2014 0455      Component Value Date/Time   CALCIUM 8.3*  12/13/2014 0837   CALCIUM 7.7* 05/17/2014 0455   ALKPHOS 92 12/13/2014 0837   ALKPHOS 232* 05/13/2014 1605   AST 11 12/13/2014 0837   AST 15 05/13/2014 1605   ALT 8 12/13/2014 0837   ALT 10 05/13/2014 1605   BILITOT 0.61 12/13/2014 0837   BILITOT 1.1 05/13/2014 1605       RADIOGRAPHIC STUDIES: Ct Chest W Contrast  12/11/2014   CLINICAL DATA:  Subsequent encounter for small-cell lung cancer.  EXAM: CT CHEST, ABDOMEN, AND PELVIS WITH CONTRAST  TECHNIQUE: Multidetector CT imaging of the chest, abdomen and pelvis was performed following the standard protocol during bolus administration of intravenous contrast.  CONTRAST:  135m OMNIPAQUE IOHEXOL 300 MG/ML  SOLN  COMPARISON:  10/13/2014.  FINDINGS: CT CHEST FINDINGS  Mediastinum/Nodes: Right-sided Port-A-Cath tip is positioned at the junction of the SVC and RA. There is no axillary lymphadenopathy. 15 mm precarinal lymph node measured on the previous study is now 13 mm in short axis. 21 mm right hilar lymph node measured previously is canal 18 mm in the same dimension and measures 12 mm in short axis. 16 mm short axis subcarinal lymph node measured on the previous study is now 10 mm in short axis. No left hilar lymphadenopathy. Heart size is normal. No pericardial effusion.  The 11 x 18 mm paraspinal nodule at the T11 level now measures 16 x 11 mm. A more ill-defined right paraspinal plaque-like lesion (image 42 series 2) measures 8 x 30 mm today compared to 12 x 31 mm previously.  Lungs/Pleura: Emphysematous changes noted in the lungs bilaterally. Tree in bud nodularity seen in the posterior right upper lobe previously is again noted but appears slightly decreased in the interval. The dominant 7 mm nodule seen in this region on the prior study is now 5 mm. 3 mm nodule along the anterior aspect of the right major fissure was 4-5 mm previously. Previously described subpleural nodule within the right lower lobe (image 31 series 4 today) is not substantially  changed in the interval measuring 6 x 19 mm compared to 6 x 20 mm previously. This is in the region of the prominent bony spur and pathologic vertebral collapse. Other scattered smaller pleural nodules are seen along the right major fissure. No suspicious nodule or mass in the left lung. There is no pulmonary edema pleural effusion. No focal airspace consolidation.  Musculoskeletal:  Multiple sclerotic lesions are seen throughout the thoracic spine. Compression deformity of the T9 vertebral body is stable and likely pathologic in nature.  CT ABDOMEN AND PELVIS FINDINGS  Hepatobiliary: Stable mild prominence of the intrahepatic biliary ducts. Tiny 6 mm hypo attenuating lesion in the posterior right liver (image 52 series 2) is stable in the interval. No other focal liver lesion is evident. Gallbladder is surgically absent. No dilatation of the extrahepatic bile ducts.  Pancreas: No focal mass lesion. No dilatation of the main duct. No intraparenchymal cyst. No peripancreatic edema.  Spleen: No splenomegaly. No focal mass lesion.  Adrenals/Urinary Tract: Stable 11 mm right adrenal nodule. No discrete left adrenal nodule. Areas of cortical scarring are seen in the kidneys bilaterally. 2.6 cm cyst identified at the lower pole the right kidney. 3.7 cm cyst identified in the lower pole of the left kidney. Other smaller cysts are noted in the kidneys bilaterally.  Stomach/Bowel: Stomach is nondistended. No gastric wall thickening. No evidence of outlet obstruction. A very large duodenum diverticulum is evident. No small bowel wall thickening. No small bowel dilatation. Terminal ileum is normal. The appendix is normal. Prominent diffuse stool volume raises the question of, but is not diagnostic for constipation. There is left colonic diverticulosis without diverticulitis.  Vascular/Lymphatic: There is abdominal aortic atherosclerosis without aneurysm. Infrarenal abdominal aorta measures up to 2.9 cm in diameter which is  upper normal. No gastrohepatic or hepatoduodenal ligament lymphadenopathy. The portal vein is patent. No retroperitoneal lymphadenopathy. No pelvic sidewall lymphadenopathy.  Reproductive: Uterus is surgically absent.  No adnexal mass.  Other: No intraperitoneal free fluid.  Musculoskeletal: Numerous sclerotic lesions are seen involving bony anatomy of the pelvis. These appear relatively stable in the interval with no definite new lesion or progressive lesion evident.  IMPRESSION: 1. Interval decrease in mediastinal lymphadenopathy. Small pleural-based and paraspinal enhancing nodules are also decreased. 2. Multiple pulmonary nodules again identified, as before. The show slight interval decrease in size. No new or progressive pulmonary nodule is evident. 3. Stable 11 mm right adrenal nodule. 4. No new or progressive disease in the abdomen or pelvis to suggest metastatic progression. 5. Numerous sclerotic bony metastases with stable compression deformity of the T9 vertebral body, likely pathologic. Bony metastatic involvement does not appear progressed in the interval.   Electronically Signed   By: Misty Stanley M.D.   On: 12/11/2014 16:00   Ct Abdomen Pelvis W Contrast  12/11/2014   CLINICAL DATA:  Subsequent encounter for small-cell lung cancer.  EXAM: CT CHEST, ABDOMEN, AND PELVIS WITH CONTRAST  TECHNIQUE: Multidetector CT imaging of the chest, abdomen and pelvis was performed following the standard protocol during bolus administration of intravenous contrast.  CONTRAST:  174m OMNIPAQUE IOHEXOL 300 MG/ML  SOLN  COMPARISON:  10/13/2014.  FINDINGS: CT CHEST FINDINGS  Mediastinum/Nodes: Right-sided Port-A-Cath tip is positioned at the junction of the SVC and RA. There is no axillary lymphadenopathy. 15 mm precarinal lymph node measured on the previous study is now 13 mm in short axis. 21 mm right hilar lymph node measured previously is canal 18 mm in the same dimension and measures 12 mm in short axis. 16 mm  short axis subcarinal lymph node measured on the previous study is now 10 mm in short axis. No left hilar lymphadenopathy. Heart size is normal. No pericardial effusion.  The 11 x 18 mm paraspinal nodule at the T11 level now measures 16 x 11 mm. A more ill-defined right paraspinal plaque-like lesion (image 42 series 2)  measures 8 x 30 mm today compared to 12 x 31 mm previously.  Lungs/Pleura: Emphysematous changes noted in the lungs bilaterally. Tree in bud nodularity seen in the posterior right upper lobe previously is again noted but appears slightly decreased in the interval. The dominant 7 mm nodule seen in this region on the prior study is now 5 mm. 3 mm nodule along the anterior aspect of the right major fissure was 4-5 mm previously. Previously described subpleural nodule within the right lower lobe (image 31 series 4 today) is not substantially changed in the interval measuring 6 x 19 mm compared to 6 x 20 mm previously. This is in the region of the prominent bony spur and pathologic vertebral collapse. Other scattered smaller pleural nodules are seen along the right major fissure. No suspicious nodule or mass in the left lung. There is no pulmonary edema pleural effusion. No focal airspace consolidation.  Musculoskeletal: Multiple sclerotic lesions are seen throughout the thoracic spine. Compression deformity of the T9 vertebral body is stable and likely pathologic in nature.  CT ABDOMEN AND PELVIS FINDINGS  Hepatobiliary: Stable mild prominence of the intrahepatic biliary ducts. Tiny 6 mm hypo attenuating lesion in the posterior right liver (image 52 series 2) is stable in the interval. No other focal liver lesion is evident. Gallbladder is surgically absent. No dilatation of the extrahepatic bile ducts.  Pancreas: No focal mass lesion. No dilatation of the main duct. No intraparenchymal cyst. No peripancreatic edema.  Spleen: No splenomegaly. No focal mass lesion.  Adrenals/Urinary Tract: Stable 11 mm  right adrenal nodule. No discrete left adrenal nodule. Areas of cortical scarring are seen in the kidneys bilaterally. 2.6 cm cyst identified at the lower pole the right kidney. 3.7 cm cyst identified in the lower pole of the left kidney. Other smaller cysts are noted in the kidneys bilaterally.  Stomach/Bowel: Stomach is nondistended. No gastric wall thickening. No evidence of outlet obstruction. A very large duodenum diverticulum is evident. No small bowel wall thickening. No small bowel dilatation. Terminal ileum is normal. The appendix is normal. Prominent diffuse stool volume raises the question of, but is not diagnostic for constipation. There is left colonic diverticulosis without diverticulitis.  Vascular/Lymphatic: There is abdominal aortic atherosclerosis without aneurysm. Infrarenal abdominal aorta measures up to 2.9 cm in diameter which is upper normal. No gastrohepatic or hepatoduodenal ligament lymphadenopathy. The portal vein is patent. No retroperitoneal lymphadenopathy. No pelvic sidewall lymphadenopathy.  Reproductive: Uterus is surgically absent.  No adnexal mass.  Other: No intraperitoneal free fluid.  Musculoskeletal: Numerous sclerotic lesions are seen involving bony anatomy of the pelvis. These appear relatively stable in the interval with no definite new lesion or progressive lesion evident.  IMPRESSION: 1. Interval decrease in mediastinal lymphadenopathy. Small pleural-based and paraspinal enhancing nodules are also decreased. 2. Multiple pulmonary nodules again identified, as before. The show slight interval decrease in size. No new or progressive pulmonary nodule is evident. 3. Stable 11 mm right adrenal nodule. 4. No new or progressive disease in the abdomen or pelvis to suggest metastatic progression. 5. Numerous sclerotic bony metastases with stable compression deformity of the T9 vertebral body, likely pathologic. Bony metastatic involvement does not appear progressed in the interval.    Electronically Signed   By: Misty Stanley M.D.   On: 12/11/2014 16:00    ASSESSMENT AND PLAN: This is a very pleasant 74 year old white female with:  1) Extensive stage small cell lung cancer currently undergoing systemic chemotherapy with carboplatin and  etoposide status post 6 cycles. She had good response to the first line treatment and she had been on observation for the last 2 months. The recent CT scan of the chest, abdomen and pelvis showed interval progression of the metastatic disease with bulky mediastinal lymph nodes as well as multiple new pulmonary nodules and new paraspinal soft tissue masses as well as extensive bone metastasis. The patient is now on cisplatin 25 MG/M2 and irinotecan 65 MG/M2 on days 1 and 8 every 3 weeks. These were reduced to cisplatin 20 MG/M2 and irinotecan 45 MG/M2 due to poor tolerance starting with the start of cycle 2. The patient is status post 2 cycles.   The patient was discussed with and also seen by Dr. Julien Nordmann. He reviewed the results of her CT of the chest abdomen and pelvis, which showed a decrease in the size of her pulmonary nodules and lymph nodes. As she is clearly responding well to treatment, we recommend she continue with cycle #3 today, which will also be dose reduced.   2) metastatic bone disease: The patient will continue on Xgeva 120 mcg subcutaneously every 4 weeks. The patient has no dental issues and currently has dentures. She was also advised to take calcium and vitamin D supplements on daily basis.  3) chemotherapy-induced anemia, stable. We'll continue to monitor for now  4) tremors,  She is also followed by neurology. She will continue on Sinemet IR.  5) right neck and back pain: She is on Percocet 5/325 mg by mouth every 6 hours as needed.    6) hypokalemia: supplemented with 20 meq potassium daily  She missed her visit with Dr. Valere Dross last week to discuss radiotherapy. I have asked that she call the office to  reschedule.  The patient will return next week for day 8 of treatment. She will continue weekly labs. Her next follow up visit will be in 3 weeks with the start of cycle #4  The patient was advised to call immediately if she has any concerning symptoms in the interval. The patient voices understanding of current disease status and treatment options and is in agreement with the current care plan.  All questions were answered. The patient knows to call the clinic with any problems, questions or concerns. We can certainly see the patient much sooner if necessary.   Laurie Panda, NP  12/13/2014 ADDENDUM: Hematology/Oncology Attending: I had a face to face encounter with the patient. I recommended her care plan. This is a very pleasant 74 years old white female with extensive stage small cell lung cancer who is currently undergoing second line systemic chemotherapy with reduced dose cisplatin and irinotecan status post 2 cycles. The patient has rough time tolerating her systemic chemotherapy with this regimen including significant diarrhea in addition to fatigue and electrolyte abnormalities. She did much better after reducing her dose of cisplatin and irinotecan with the second cycle. Her recent CT scan of the chest, abdomen and pelvis showed improvement in her disease with the few treatment that she received recently. I recommended for her to continue with the same regiment but we will continue to reduce the dose of cisplatin to 20 MG/M2 and irinotecan to 45 MG/M2 on days 1 and 8 every 3 weeks. The patient will proceed with cycle #3 today as a scheduled. She would come back for follow-up visit in 3 weeks for reevaluation before starting cycle #4. She was advised to call immediately if she has any concerning symptoms in the interval.  Disclaimer: This note was dictated with voice recognition software. Similar sounding words can inadvertently be transcribed and may be missed upon  review. Eilleen Kempf., MD 12/18/2014

## 2014-12-13 NOTE — Telephone Encounter (Signed)
Appointments made and avs printed for patient °

## 2014-12-13 NOTE — Patient Instructions (Signed)

## 2014-12-15 ENCOUNTER — Ambulatory Visit: Payer: Medicare Other

## 2014-12-15 ENCOUNTER — Telehealth: Payer: Self-pay | Admitting: Internal Medicine

## 2014-12-15 NOTE — Telephone Encounter (Signed)
pt husband called wife is sick with diarea....cx appt today

## 2014-12-18 ENCOUNTER — Other Ambulatory Visit: Payer: Self-pay | Admitting: Medical Oncology

## 2014-12-18 ENCOUNTER — Ambulatory Visit (HOSPITAL_BASED_OUTPATIENT_CLINIC_OR_DEPARTMENT_OTHER): Payer: Medicare Other

## 2014-12-18 ENCOUNTER — Other Ambulatory Visit: Payer: Self-pay | Admitting: Nurse Practitioner

## 2014-12-18 VITALS — BP 134/57 | HR 81 | Temp 96.9°F | Resp 20

## 2014-12-18 DIAGNOSIS — C3491 Malignant neoplasm of unspecified part of right bronchus or lung: Secondary | ICD-10-CM

## 2014-12-18 DIAGNOSIS — C3490 Malignant neoplasm of unspecified part of unspecified bronchus or lung: Secondary | ICD-10-CM

## 2014-12-18 DIAGNOSIS — E860 Dehydration: Secondary | ICD-10-CM | POA: Diagnosis not present

## 2014-12-18 MED ORDER — HEPARIN SOD (PORK) LOCK FLUSH 100 UNIT/ML IV SOLN
500.0000 [IU] | Freq: Once | INTRAVENOUS | Status: AC
  Administered 2014-12-18: 500 [IU] via INTRAVENOUS
  Filled 2014-12-18: qty 5

## 2014-12-18 MED ORDER — SODIUM CHLORIDE 0.9 % IJ SOLN
10.0000 mL | INTRAMUSCULAR | Status: DC | PRN
  Administered 2014-12-18: 10 mL via INTRAVENOUS
  Filled 2014-12-18: qty 10

## 2014-12-18 MED ORDER — SODIUM CHLORIDE 0.9 % IV SOLN
Freq: Once | INTRAVENOUS | Status: AC
  Administered 2014-12-18: 15:00:00 via INTRAVENOUS

## 2014-12-18 NOTE — Patient Instructions (Signed)
Dehydration, Adult Dehydration is when you lose more fluids from the body than you take in. Vital organs like the kidneys, brain, and heart cannot function without a proper amount of fluids and salt. Any loss of fluids from the body can cause dehydration.  CAUSES   Vomiting.  Diarrhea.  Excessive sweating.  Excessive urine output.  Fever. SYMPTOMS  Mild dehydration  Thirst.  Dry lips.  Slightly dry mouth. Moderate dehydration  Very dry mouth.  Sunken eyes.  Skin does not bounce back quickly when lightly pinched and released.  Dark urine and decreased urine production.  Decreased tear production.  Headache. Severe dehydration  Very dry mouth.  Extreme thirst.  Rapid, weak pulse (more than 100 beats per minute at rest).  Cold hands and feet.  Not able to sweat in spite of heat and temperature.  Rapid breathing.  Blue lips.  Confusion and lethargy.  Difficulty being awakened.  Minimal urine production.  No tears. DIAGNOSIS  Your caregiver will diagnose dehydration based on your symptoms and your exam. Blood and urine tests will help confirm the diagnosis. The diagnostic evaluation should also identify the cause of dehydration. TREATMENT  Treatment of mild or moderate dehydration can often be done at home by increasing the amount of fluids that you drink. It is best to drink small amounts of fluid more often. Drinking too much at one time can make vomiting worse. Refer to the home care instructions below. Severe dehydration needs to be treated at the hospital where you will probably be given intravenous (IV) fluids that contain water and electrolytes. HOME CARE INSTRUCTIONS   Ask your caregiver about specific rehydration instructions.  Drink enough fluids to keep your urine clear or pale yellow.  Drink small amounts frequently if you have nausea and vomiting.  Eat as you normally do.  Avoid:  Foods or drinks high in sugar.  Carbonated  drinks.  Juice.  Extremely hot or cold fluids.  Drinks with caffeine.  Fatty, greasy foods.  Alcohol.  Tobacco.  Overeating.  Gelatin desserts.  Wash your hands well to avoid spreading bacteria and viruses.  Only take over-the-counter or prescription medicines for pain, discomfort, or fever as directed by your caregiver.  Ask your caregiver if you should continue all prescribed and over-the-counter medicines.  Keep all follow-up appointments with your caregiver. SEEK MEDICAL CARE IF:  You have abdominal pain and it increases or stays in one area (localizes).  You have a rash, stiff neck, or severe headache.  You are irritable, sleepy, or difficult to awaken.  You are weak, dizzy, or extremely thirsty. SEEK IMMEDIATE MEDICAL CARE IF:   You are unable to keep fluids down or you get worse despite treatment.  You have frequent episodes of vomiting or diarrhea.  You have blood or green matter (bile) in your vomit.  You have blood in your stool or your stool looks black and tarry.  You have not urinated in 6 to 8 hours, or you have only urinated a small amount of very dark urine.  You have a fever.  You faint. MAKE SURE YOU:   Understand these instructions.  Will watch your condition.  Will get help right away if you are not doing well or get worse. Document Released: 05/05/2005 Document Revised: 07/28/2011 Document Reviewed: 12/23/2010 [employer] Patient Information 2015 [employer]. This information is not intended to replace advice given to you by your health care provider. Make sure you discuss any questions you have with your health care   provider.  

## 2014-12-18 NOTE — Progress Notes (Signed)
ERRONEOUS

## 2014-12-18 NOTE — Addendum Note (Signed)
Addended by: Curt Bears on: 12/18/2014 09:28 PM   Modules accepted: Level of Service

## 2014-12-19 ENCOUNTER — Other Ambulatory Visit: Payer: Self-pay | Admitting: Medical Oncology

## 2014-12-19 DIAGNOSIS — C3490 Malignant neoplasm of unspecified part of unspecified bronchus or lung: Secondary | ICD-10-CM

## 2014-12-20 ENCOUNTER — Ambulatory Visit (HOSPITAL_BASED_OUTPATIENT_CLINIC_OR_DEPARTMENT_OTHER): Payer: Medicare Other

## 2014-12-20 ENCOUNTER — Other Ambulatory Visit: Payer: Self-pay | Admitting: Medical Oncology

## 2014-12-20 ENCOUNTER — Other Ambulatory Visit: Payer: Medicare Other

## 2014-12-20 ENCOUNTER — Telehealth: Payer: Self-pay | Admitting: Internal Medicine

## 2014-12-20 ENCOUNTER — Other Ambulatory Visit (HOSPITAL_BASED_OUTPATIENT_CLINIC_OR_DEPARTMENT_OTHER): Payer: Medicare Other

## 2014-12-20 ENCOUNTER — Ambulatory Visit: Payer: Medicare Other

## 2014-12-20 ENCOUNTER — Other Ambulatory Visit: Payer: Self-pay | Admitting: Internal Medicine

## 2014-12-20 VITALS — BP 103/45 | HR 72 | Temp 97.8°F | Resp 16

## 2014-12-20 DIAGNOSIS — Z5111 Encounter for antineoplastic chemotherapy: Secondary | ICD-10-CM | POA: Diagnosis not present

## 2014-12-20 DIAGNOSIS — C3490 Malignant neoplasm of unspecified part of unspecified bronchus or lung: Secondary | ICD-10-CM

## 2014-12-20 DIAGNOSIS — C3491 Malignant neoplasm of unspecified part of right bronchus or lung: Secondary | ICD-10-CM

## 2014-12-20 DIAGNOSIS — Z95828 Presence of other vascular implants and grafts: Secondary | ICD-10-CM

## 2014-12-20 LAB — COMPREHENSIVE METABOLIC PANEL (CC13)
ALT: 8 U/L (ref 0–55)
ANION GAP: 7 meq/L (ref 3–11)
AST: 11 U/L (ref 5–34)
Albumin: 3.6 g/dL (ref 3.5–5.0)
Alkaline Phosphatase: 70 U/L (ref 40–150)
BUN: 11.4 mg/dL (ref 7.0–26.0)
CHLORIDE: 92 meq/L — AB (ref 98–109)
CO2: 28 mEq/L (ref 22–29)
CREATININE: 0.8 mg/dL (ref 0.6–1.1)
Calcium: 7.3 mg/dL — ABNORMAL LOW (ref 8.4–10.4)
EGFR: 77 mL/min/{1.73_m2} — AB (ref >90)
GLUCOSE: 96 mg/dL (ref 70–140)
Potassium: 4.4 mEq/L (ref 3.5–5.1)
Sodium: 128 mEq/L — ABNORMAL LOW (ref 136–145)
Total Bilirubin: 0.87 mg/dL (ref 0.20–1.20)
Total Protein: 6.2 g/dL — ABNORMAL LOW (ref 6.4–8.3)

## 2014-12-20 LAB — CBC WITH DIFFERENTIAL/PLATELET
BASO%: 0.7 % (ref 0.0–2.0)
Basophils Absolute: 0 10*3/uL (ref 0.0–0.1)
EOS ABS: 0.1 10*3/uL (ref 0.0–0.5)
EOS%: 2.7 % (ref 0.0–7.0)
HEMATOCRIT: 27.8 % — AB (ref 34.8–46.6)
HGB: 9.7 g/dL — ABNORMAL LOW (ref 11.6–15.9)
LYMPH%: 35.7 % (ref 14.0–49.7)
MCH: 29.9 pg (ref 25.1–34.0)
MCHC: 34.8 g/dL (ref 31.5–36.0)
MCV: 85.8 fL (ref 79.5–101.0)
MONO#: 0.2 10*3/uL (ref 0.1–0.9)
MONO%: 11.3 % (ref 0.0–14.0)
NEUT%: 49.6 % (ref 38.4–76.8)
NEUTROS ABS: 1 10*3/uL — AB (ref 1.5–6.5)
PLATELETS: 101 10*3/uL — AB (ref 145–400)
RBC: 3.24 10*6/uL — ABNORMAL LOW (ref 3.70–5.45)
RDW: 16.6 % — ABNORMAL HIGH (ref 11.2–14.5)
WBC: 2 10*3/uL — ABNORMAL LOW (ref 3.9–10.3)
lymph#: 0.7 10*3/uL — ABNORMAL LOW (ref 0.9–3.3)

## 2014-12-20 MED ORDER — DENOSUMAB 120 MG/1.7ML ~~LOC~~ SOLN
120.0000 mg | Freq: Once | SUBCUTANEOUS | Status: DC
Start: 2014-12-20 — End: 2014-12-20

## 2014-12-20 MED ORDER — HEPARIN SOD (PORK) LOCK FLUSH 100 UNIT/ML IV SOLN
500.0000 [IU] | Freq: Once | INTRAVENOUS | Status: AC | PRN
Start: 2014-12-20 — End: 2014-12-20
  Administered 2014-12-20: 500 [IU]
  Filled 2014-12-20: qty 5

## 2014-12-20 MED ORDER — PALONOSETRON HCL INJECTION 0.25 MG/5ML
INTRAVENOUS | Status: AC
  Filled 2014-12-20: qty 5

## 2014-12-20 MED ORDER — PALONOSETRON HCL INJECTION 0.25 MG/5ML
0.2500 mg | Freq: Once | INTRAVENOUS | Status: AC
  Administered 2014-12-20: 0.25 mg via INTRAVENOUS

## 2014-12-20 MED ORDER — POTASSIUM CHLORIDE 2 MEQ/ML IV SOLN
Freq: Once | INTRAVENOUS | Status: AC
  Administered 2014-12-20: 10:00:00 via INTRAVENOUS
  Filled 2014-12-20: qty 10

## 2014-12-20 MED ORDER — IRINOTECAN HCL CHEMO INJECTION 100 MG/5ML
45.0000 mg/m2 | Freq: Once | INTRAVENOUS | Status: AC
  Administered 2014-12-20: 74 mg via INTRAVENOUS
  Filled 2014-12-20: qty 3.7

## 2014-12-20 MED ORDER — SODIUM CHLORIDE 0.9 % IJ SOLN
10.0000 mL | INTRAMUSCULAR | Status: DC | PRN
  Administered 2014-12-20: 10 mL
  Filled 2014-12-20: qty 10

## 2014-12-20 MED ORDER — SODIUM CHLORIDE 0.9 % IJ SOLN
10.0000 mL | INTRAMUSCULAR | Status: DC | PRN
  Administered 2014-12-20: 10 mL via INTRAVENOUS
  Filled 2014-12-20: qty 10

## 2014-12-20 MED ORDER — ATROPINE SULFATE 1 MG/ML IJ SOLN
INTRAMUSCULAR | Status: AC
  Filled 2014-12-20: qty 1

## 2014-12-20 MED ORDER — SODIUM CHLORIDE 0.9 % IV SOLN
Freq: Once | INTRAVENOUS | Status: AC
  Administered 2014-12-20: 12:00:00 via INTRAVENOUS
  Filled 2014-12-20: qty 5

## 2014-12-20 MED ORDER — SODIUM CHLORIDE 0.9 % IV SOLN
20.0000 mg/m2 | Freq: Once | INTRAVENOUS | Status: AC
  Administered 2014-12-20: 33 mg via INTRAVENOUS
  Filled 2014-12-20: qty 33

## 2014-12-20 MED ORDER — ATROPINE SULFATE 1 MG/ML IJ SOLN
0.5000 mg | Freq: Once | INTRAMUSCULAR | Status: AC | PRN
  Administered 2014-12-20: 0.5 mg via INTRAVENOUS

## 2014-12-20 MED ORDER — SODIUM CHLORIDE 0.9 % IV SOLN
Freq: Once | INTRAVENOUS | Status: AC
  Administered 2014-12-20: 10:00:00 via INTRAVENOUS

## 2014-12-20 NOTE — Progress Notes (Addendum)
ANC today 1.0, Na 128. Discussed with Dr. Julien Nordmann. Okay to treat, but pt will need to return for neulasta injection tomorrow. Diane RN informed to set up apt for tomorrow. Pt informed that treatment will be continued and agrees to [employer] shot tomorrow.  Per Dr. Julien Nordmann do not give Delton See until further notice.  Pt voided 350 ml pre cisplatin, only 75 ml post cysplatin. Dr. Julien Nordmann notified. No orders received. Pt informed and encouraged to continue to drink fluids. Pt expressed understanding.

## 2014-12-20 NOTE — Patient Instructions (Signed)

## 2014-12-20 NOTE — Patient Instructions (Signed)
[facility] Discharge Instructions for Patients Receiving Chemotherapy  Today you received the following chemotherapy agents camptosar, cisplatin  To help prevent nausea and vomiting after your treatment, we encourage you to take your nausea medication    If you develop nausea and vomiting that is not controlled by your nausea medication, call the clinic.   BELOW ARE SYMPTOMS THAT SHOULD BE REPORTED IMMEDIATELY:  *FEVER GREATER THAN 100.5 F  *CHILLS WITH OR WITHOUT FEVER  NAUSEA AND VOMITING THAT IS NOT CONTROLLED WITH YOUR NAUSEA MEDICATION  *UNUSUAL SHORTNESS OF BREATH  *UNUSUAL BRUISING OR BLEEDING  TENDERNESS IN MOUTH AND THROAT WITH OR WITHOUT PRESENCE OF ULCERS  *URINARY PROBLEMS  *BOWEL PROBLEMS  UNUSUAL RASH Items with * indicate a potential emergency and should be followed up as soon as possible.  Feel free to call the clinic you have any questions or concerns. The clinic phone number is [phone].  Please show the [facility] at check-in to the Emergency Department and triage nurse.

## 2014-12-20 NOTE — Telephone Encounter (Signed)
Added inj for tomorrow per 8/3 pof. Spoke with infusion patient will be given new schedule in infusion area. No other orders per 8/3 pof.

## 2014-12-21 ENCOUNTER — Ambulatory Visit (HOSPITAL_BASED_OUTPATIENT_CLINIC_OR_DEPARTMENT_OTHER): Payer: Medicare Other

## 2014-12-21 VITALS — BP 116/52 | HR 68 | Temp 98.6°F

## 2014-12-21 DIAGNOSIS — C7951 Secondary malignant neoplasm of bone: Secondary | ICD-10-CM | POA: Diagnosis not present

## 2014-12-21 DIAGNOSIS — Z5189 Encounter for other specified aftercare: Secondary | ICD-10-CM

## 2014-12-21 DIAGNOSIS — C3490 Malignant neoplasm of unspecified part of unspecified bronchus or lung: Secondary | ICD-10-CM

## 2014-12-21 DIAGNOSIS — C3491 Malignant neoplasm of unspecified part of right bronchus or lung: Secondary | ICD-10-CM

## 2014-12-21 MED ORDER — PEGFILGRASTIM INJECTION 6 MG/0.6ML ~~LOC~~
6.0000 mg | PREFILLED_SYRINGE | Freq: Once | SUBCUTANEOUS | Status: AC
  Administered 2014-12-21: 6 mg via SUBCUTANEOUS
  Filled 2014-12-21: qty 0.6

## 2014-12-23 ENCOUNTER — Emergency Department (HOSPITAL_COMMUNITY): Payer: Medicare Other

## 2014-12-23 ENCOUNTER — Encounter (HOSPITAL_COMMUNITY): Payer: Self-pay | Admitting: Emergency Medicine

## 2014-12-23 ENCOUNTER — Inpatient Hospital Stay (HOSPITAL_COMMUNITY)
Admission: EM | Admit: 2014-12-23 | Discharge: 2014-12-26 | DRG: 393 | Disposition: A | Discharge status: Home / Self Care | Payer: Medicare Other | Attending: Internal Medicine | Admitting: Internal Medicine

## 2014-12-23 DIAGNOSIS — Z8371 Family history of colonic polyps: Secondary | ICD-10-CM | POA: Diagnosis not present

## 2014-12-23 DIAGNOSIS — K521 Toxic gastroenteritis and colitis: Secondary | ICD-10-CM | POA: Diagnosis present

## 2014-12-23 DIAGNOSIS — K219 Gastro-esophageal reflux disease without esophagitis: Secondary | ICD-10-CM | POA: Diagnosis present

## 2014-12-23 DIAGNOSIS — E871 Hypo-osmolality and hyponatremia: Secondary | ICD-10-CM | POA: Diagnosis present

## 2014-12-23 DIAGNOSIS — E559 Vitamin D deficiency, unspecified: Secondary | ICD-10-CM | POA: Diagnosis present

## 2014-12-23 DIAGNOSIS — E785 Hyperlipidemia, unspecified: Secondary | ICD-10-CM | POA: Diagnosis present

## 2014-12-23 DIAGNOSIS — Z681 Body mass index (BMI) 19 or less, adult: Secondary | ICD-10-CM | POA: Diagnosis not present

## 2014-12-23 DIAGNOSIS — Z881 Allergy status to other antibiotic agents status: Secondary | ICD-10-CM | POA: Diagnosis not present

## 2014-12-23 DIAGNOSIS — E860 Dehydration: Secondary | ICD-10-CM | POA: Diagnosis present

## 2014-12-23 DIAGNOSIS — Z833 Family history of diabetes mellitus: Secondary | ICD-10-CM

## 2014-12-23 DIAGNOSIS — Z79899 Other long term (current) drug therapy: Secondary | ICD-10-CM | POA: Diagnosis not present

## 2014-12-23 DIAGNOSIS — R338 Other retention of urine: Secondary | ICD-10-CM

## 2014-12-23 DIAGNOSIS — Z87891 Personal history of nicotine dependence: Secondary | ICD-10-CM | POA: Diagnosis not present

## 2014-12-23 DIAGNOSIS — E876 Hypokalemia: Secondary | ICD-10-CM | POA: Diagnosis present

## 2014-12-23 DIAGNOSIS — F419 Anxiety disorder, unspecified: Secondary | ICD-10-CM | POA: Diagnosis present

## 2014-12-23 DIAGNOSIS — R339 Retention of urine, unspecified: Secondary | ICD-10-CM | POA: Diagnosis present

## 2014-12-23 DIAGNOSIS — D696 Thrombocytopenia, unspecified: Secondary | ICD-10-CM | POA: Diagnosis present

## 2014-12-23 DIAGNOSIS — Z888 Allergy status to other drugs, medicaments and biological substances status: Secondary | ICD-10-CM | POA: Diagnosis not present

## 2014-12-23 DIAGNOSIS — M810 Age-related osteoporosis without current pathological fracture: Secondary | ICD-10-CM | POA: Diagnosis present

## 2014-12-23 DIAGNOSIS — Z66 Do not resuscitate: Secondary | ICD-10-CM | POA: Diagnosis present

## 2014-12-23 DIAGNOSIS — D61810 Antineoplastic chemotherapy induced pancytopenia: Secondary | ICD-10-CM | POA: Diagnosis present

## 2014-12-23 DIAGNOSIS — E861 Hypovolemia: Secondary | ICD-10-CM | POA: Diagnosis present

## 2014-12-23 DIAGNOSIS — R251 Tremor, unspecified: Secondary | ICD-10-CM | POA: Diagnosis present

## 2014-12-23 DIAGNOSIS — I951 Orthostatic hypotension: Secondary | ICD-10-CM | POA: Diagnosis present

## 2014-12-23 DIAGNOSIS — C3490 Malignant neoplasm of unspecified part of unspecified bronchus or lung: Secondary | ICD-10-CM | POA: Diagnosis present

## 2014-12-23 DIAGNOSIS — Z803 Family history of malignant neoplasm of breast: Secondary | ICD-10-CM | POA: Diagnosis not present

## 2014-12-23 DIAGNOSIS — R197 Diarrhea, unspecified: Secondary | ICD-10-CM | POA: Diagnosis present

## 2014-12-23 DIAGNOSIS — I10 Essential (primary) hypertension: Secondary | ICD-10-CM | POA: Diagnosis present

## 2014-12-23 DIAGNOSIS — E43 Unspecified severe protein-calorie malnutrition: Secondary | ICD-10-CM | POA: Diagnosis present

## 2014-12-23 DIAGNOSIS — R112 Nausea with vomiting, unspecified: Secondary | ICD-10-CM | POA: Diagnosis present

## 2014-12-23 DIAGNOSIS — K589 Irritable bowel syndrome without diarrhea: Secondary | ICD-10-CM | POA: Diagnosis present

## 2014-12-23 DIAGNOSIS — Z79891 Long term (current) use of opiate analgesic: Secondary | ICD-10-CM

## 2014-12-23 DIAGNOSIS — T451X5A Adverse effect of antineoplastic and immunosuppressive drugs, initial encounter: Secondary | ICD-10-CM | POA: Diagnosis present

## 2014-12-23 DIAGNOSIS — E213 Hyperparathyroidism, unspecified: Secondary | ICD-10-CM | POA: Diagnosis present

## 2014-12-23 DIAGNOSIS — Z886 Allergy status to analgesic agent status: Secondary | ICD-10-CM | POA: Diagnosis not present

## 2014-12-23 DIAGNOSIS — C7951 Secondary malignant neoplasm of bone: Secondary | ICD-10-CM | POA: Diagnosis present

## 2014-12-23 DIAGNOSIS — R110 Nausea: Secondary | ICD-10-CM | POA: Diagnosis not present

## 2014-12-23 LAB — CBC WITH DIFFERENTIAL/PLATELET
Basophils Absolute: 0 10*3/uL (ref 0.0–0.1)
Basophils Relative: 0 % (ref 0–1)
EOS PCT: 0 % (ref 0–5)
Eosinophils Absolute: 0 10*3/uL (ref 0.0–0.7)
HCT: 28.1 % — ABNORMAL LOW (ref 36.0–46.0)
Hemoglobin: 10 g/dL — ABNORMAL LOW (ref 12.0–15.0)
Lymphocytes Relative: 8 % — ABNORMAL LOW (ref 12–46)
Lymphs Abs: 0.6 10*3/uL — ABNORMAL LOW (ref 0.7–4.0)
MCH: 30 pg (ref 26.0–34.0)
MCHC: 35.6 g/dL (ref 30.0–36.0)
MCV: 84.4 fL (ref 78.0–100.0)
MONO ABS: 0.4 10*3/uL (ref 0.1–1.0)
MONOS PCT: 5 % (ref 3–12)
NEUTROS ABS: 6.2 10*3/uL (ref 1.7–7.7)
Neutrophils Relative %: 87 % — ABNORMAL HIGH (ref 43–77)
Platelets: 86 10*3/uL — ABNORMAL LOW (ref 150–400)
RBC: 3.33 MIL/uL — ABNORMAL LOW (ref 3.87–5.11)
RDW: 15.6 % — AB (ref 11.5–15.5)
WBC: 7.2 10*3/uL (ref 4.0–10.5)

## 2014-12-23 LAB — COMPREHENSIVE METABOLIC PANEL
ALT: 8 U/L — ABNORMAL LOW (ref 14–54)
AST: 16 U/L (ref 15–41)
Albumin: 4 g/dL (ref 3.5–5.0)
Alkaline Phosphatase: 71 U/L (ref 38–126)
Anion gap: 13 (ref 5–15)
BILIRUBIN TOTAL: 1.6 mg/dL — AB (ref 0.3–1.2)
BUN: 21 mg/dL — ABNORMAL HIGH (ref 6–20)
CO2: 25 mmol/L (ref 22–32)
Calcium: 5.9 mg/dL — CL (ref 8.9–10.3)
Chloride: 84 mmol/L — ABNORMAL LOW (ref 101–111)
Creatinine, Ser: 1.11 mg/dL — ABNORMAL HIGH (ref 0.44–1.00)
GFR calc non Af Amer: 48 mL/min — ABNORMAL LOW (ref >60)
GFR, EST AFRICAN AMERICAN: 56 mL/min — AB (ref >60)
GLUCOSE: 88 mg/dL (ref 65–99)
Potassium: 4 mmol/L (ref 3.5–5.1)
Sodium: 122 mmol/L — ABNORMAL LOW (ref 135–145)
TOTAL PROTEIN: 6.8 g/dL (ref 6.5–8.1)

## 2014-12-23 LAB — MAGNESIUM: MAGNESIUM: 1 mg/dL — AB (ref 1.7–2.4)

## 2014-12-23 LAB — CBC
HCT: 25.6 % — ABNORMAL LOW (ref 36.0–46.0)
Hemoglobin: 9.1 g/dL — ABNORMAL LOW (ref 12.0–15.0)
MCH: 30.2 pg (ref 26.0–34.0)
MCHC: 35.5 g/dL (ref 30.0–36.0)
MCV: 85 fL (ref 78.0–100.0)
Platelets: 64 10*3/uL — ABNORMAL LOW (ref 150–400)
RBC: 3.01 MIL/uL — ABNORMAL LOW (ref 3.87–5.11)
RDW: 15.7 % — ABNORMAL HIGH (ref 11.5–15.5)
WBC: 5.4 10*3/uL (ref 4.0–10.5)

## 2014-12-23 LAB — CREATININE, SERUM
Creatinine, Ser: 0.96 mg/dL (ref 0.44–1.00)
GFR, EST NON AFRICAN AMERICAN: 57 mL/min — AB (ref >60)

## 2014-12-23 LAB — LACTIC ACID, PLASMA: Lactic Acid, Venous: 0.8 mmol/L (ref 0.5–2.0)

## 2014-12-23 LAB — LIPASE, BLOOD: LIPASE: 17 U/L — AB (ref 22–51)

## 2014-12-23 LAB — I-STAT CG4 LACTIC ACID, ED: LACTIC ACID, VENOUS: 1.1 mmol/L (ref 0.5–2.0)

## 2014-12-23 MED ORDER — SODIUM CHLORIDE 0.9 % IV SOLN
INTRAVENOUS | Status: DC
  Administered 2014-12-23: 17:00:00 via INTRAVENOUS

## 2014-12-23 MED ORDER — BUPROPION HCL ER (SR) 150 MG PO TB12
150.0000 mg | ORAL_TABLET | Freq: Every morning | ORAL | Status: DC
  Administered 2014-12-24 – 2014-12-26 (×3): 150 mg via ORAL
  Filled 2014-12-23 (×3): qty 1

## 2014-12-23 MED ORDER — DIPHENOXYLATE-ATROPINE 2.5-0.025 MG PO TABS
2.0000 | ORAL_TABLET | Freq: Four times a day (QID) | ORAL | Status: DC | PRN
  Administered 2014-12-24 – 2014-12-26 (×6): 2 via ORAL
  Filled 2014-12-23 (×6): qty 2

## 2014-12-23 MED ORDER — OXYCODONE-ACETAMINOPHEN 5-325 MG PO TABS: 1.0000 | ORAL_TABLET | Freq: Four times a day (QID) | ORAL | Status: DC | PRN

## 2014-12-23 MED ORDER — CARBIDOPA-LEVODOPA 25-100 MG PO TABS
1.0000 | ORAL_TABLET | Freq: Three times a day (TID) | ORAL | Status: DC
  Administered 2014-12-24 – 2014-12-26 (×8): 1 via ORAL
  Filled 2014-12-23 (×10): qty 1

## 2014-12-23 MED ORDER — HYDROCODONE-ACETAMINOPHEN 5-325 MG PO TABS: 1.0000 | ORAL_TABLET | ORAL | Status: DC | PRN

## 2014-12-23 MED ORDER — SODIUM CHLORIDE 0.9 % IV SOLN
INTRAVENOUS | Status: DC
  Administered 2014-12-24 – 2014-12-25 (×4): via INTRAVENOUS

## 2014-12-23 MED ORDER — ATORVASTATIN CALCIUM 80 MG PO TABS
80.0000 mg | ORAL_TABLET | Freq: Every day | ORAL | Status: DC
  Administered 2014-12-24 – 2014-12-25 (×2): 80 mg via ORAL
  Filled 2014-12-23 (×4): qty 1

## 2014-12-23 MED ORDER — ACETAMINOPHEN 650 MG RE SUPP: 650.0000 mg | Freq: Four times a day (QID) | RECTAL | Status: DC | PRN

## 2014-12-23 MED ORDER — PROCHLORPERAZINE MALEATE 10 MG PO TABS
10.0000 mg | ORAL_TABLET | Freq: Four times a day (QID) | ORAL | Status: DC | PRN
  Administered 2014-12-26: 10 mg via ORAL
  Filled 2014-12-23: qty 1

## 2014-12-23 MED ORDER — SODIUM CHLORIDE 0.9 % IJ SOLN
10.0000 mL | INTRAMUSCULAR | Status: DC | PRN
  Administered 2014-12-23 – 2014-12-24 (×2): 10 mL
  Administered 2014-12-24: 20 mL
  Administered 2014-12-25 – 2014-12-26 (×4): 10 mL
  Filled 2014-12-23 (×7): qty 40

## 2014-12-23 MED ORDER — BISACODYL 10 MG RE SUPP: 10.0000 mg | Freq: Every day | RECTAL | Status: DC | PRN

## 2014-12-23 MED ORDER — GLYCOPYRROLATE 1 MG PO TABS
2.0000 mg | ORAL_TABLET | Freq: Two times a day (BID) | ORAL | Status: DC
  Administered 2014-12-24 – 2014-12-26 (×5): 2 mg via ORAL
  Filled 2014-12-23 (×7): qty 2

## 2014-12-23 MED ORDER — ONDANSETRON HCL 4 MG/2ML IJ SOLN: 4.0000 mg | INTRAMUSCULAR | Status: DC | PRN

## 2014-12-23 MED ORDER — SODIUM CHLORIDE 0.9 % IV SOLN
2.0000 g | Freq: Once | INTRAVENOUS | Status: AC
  Administered 2014-12-23: 2 g via INTRAVENOUS
  Filled 2014-12-23: qty 20

## 2014-12-23 MED ORDER — PANTOPRAZOLE SODIUM 40 MG PO TBEC
40.0000 mg | DELAYED_RELEASE_TABLET | Freq: Every day | ORAL | Status: DC
  Administered 2014-12-24 – 2014-12-26 (×3): 40 mg via ORAL
  Filled 2014-12-23 (×3): qty 1

## 2014-12-23 MED ORDER — ACETAMINOPHEN 325 MG PO TABS: 650.0000 mg | ORAL_TABLET | Freq: Four times a day (QID) | ORAL | Status: DC | PRN

## 2014-12-23 MED ORDER — LOPERAMIDE HCL 2 MG PO CAPS
2.0000 mg | ORAL_CAPSULE | Freq: Four times a day (QID) | ORAL | Status: DC | PRN
  Administered 2014-12-23: 2 mg via ORAL
  Filled 2014-12-23: qty 1

## 2014-12-23 MED ORDER — ENSURE ENLIVE PO LIQD
237.0000 mL | Freq: Two times a day (BID) | ORAL | Status: DC
  Filled 2014-12-23: qty 237

## 2014-12-23 MED ORDER — HEPARIN SODIUM (PORCINE) 5000 UNIT/ML IJ SOLN
5000.0000 [IU] | Freq: Three times a day (TID) | INTRAMUSCULAR | Status: DC
  Administered 2014-12-24: 5000 [IU] via SUBCUTANEOUS
  Filled 2014-12-23 (×3): qty 1

## 2014-12-23 MED ORDER — ALBUTEROL SULFATE (2.5 MG/3ML) 0.083% IN NEBU: 2.5000 mg | INHALATION_SOLUTION | RESPIRATORY_TRACT | Status: DC | PRN

## 2014-12-23 MED ORDER — FAMOTIDINE IN NACL 20-0.9 MG/50ML-% IV SOLN
20.0000 mg | Freq: Once | INTRAVENOUS | Status: AC
  Administered 2014-12-23: 20 mg via INTRAVENOUS
  Filled 2014-12-23: qty 50

## 2014-12-23 MED ORDER — SODIUM CHLORIDE 0.9 % IJ SOLN: 3.0000 mL | Freq: Two times a day (BID) | INTRAMUSCULAR | Status: DC

## 2014-12-23 MED ORDER — ONDANSETRON HCL 4 MG/2ML IJ SOLN
4.0000 mg | Freq: Four times a day (QID) | INTRAMUSCULAR | Status: DC | PRN
  Administered 2014-12-23 – 2014-12-26 (×5): 4 mg via INTRAVENOUS
  Filled 2014-12-23 (×5): qty 2

## 2014-12-23 MED ORDER — ONDANSETRON HCL 4 MG PO TABS: 4.0000 mg | ORAL_TABLET | Freq: Four times a day (QID) | ORAL | Status: DC | PRN

## 2014-12-23 MED ORDER — CALCIUM CARBONATE 1250 (500 CA) MG PO TABS
500.0000 mg | ORAL_TABLET | Freq: Three times a day (TID) | ORAL | Status: DC
  Administered 2014-12-24 – 2014-12-26 (×7): 500 mg via ORAL
  Filled 2014-12-23 (×10): qty 1

## 2014-12-23 MED ORDER — POLYETHYLENE GLYCOL 3350 17 G PO PACK: 17.0000 g | PACK | Freq: Every day | ORAL | Status: DC | PRN

## 2014-12-23 MED ORDER — POTASSIUM CHLORIDE CRYS ER 20 MEQ PO TBCR
20.0000 meq | EXTENDED_RELEASE_TABLET | Freq: Two times a day (BID) | ORAL | Status: DC
  Administered 2014-12-24 – 2014-12-26 (×4): 20 meq via ORAL
  Filled 2014-12-23 (×4): qty 1

## 2014-12-23 NOTE — ED Provider Notes (Signed)
CSN: [id number]     Arrival date & time 12/23/14  1413 History   First MD Initiated Contact with Patient 12/23/14 1511     Chief Complaint  Patient presents with  . Diarrhea      HPI Pt was seen at 1520. Per pt, c/o gradual onset and persistence of multiple intermittent episodes of N/V/D that began 3 days ago.   Describes the stools as "watery." Has been associated with generalized abd "cramping" and poor PO intake. Symptoms started after her chemotherapy treatment. Pt states she has hx of same symptoms "but it's usually not this bad." Denies abd pain, no CP/SOB, no back pain, no fevers, no black or blood in stools or emesis.     Past Medical History  Diagnosis Date  . GERD (gastroesophageal reflux disease)   . IBS (irritable bowel syndrome)   . Adenomatous colon polyp 06/2009  . Hiatal hernia   . Breast cyst   . Diverticulosis   . Hemorrhoids   . Hyperlipidemia   . Hypertension   . Hyperparathyroidism   . Anxiety   . Migraine   . Osteoporosis   . Vitamin D deficiency   . Spinal stenosis   . C. difficile colitis 2006  . Small cell lung carcinoma dx'd 02/2014  . Bone metastases dx'd 02/2014   Past Surgical History  Procedure Laterality Date  . Cholecystectomy    . Vaginal hysterectomy    . Knee arthroscopy      left  . Parathyroidectomy  2009    [facility]  . Lumbar spine surgery  2001  . Breast biopsy      bilateral  . Total knee arthroplasty      right  . Bladder repair    . Video bronchoscopy Bilateral 03/14/2014    Procedure: VIDEO BRONCHOSCOPY WITHOUT FLUORO;  Surgeon: Tanda Rockers, MD;  Location: [facility];  Service: Cardiopulmonary;  Laterality: Bilateral;   Family History  Problem Relation Age of Onset  . Colon polyps Sister   . Irritable bowel syndrome Sister   . Breast cancer Sister   . Diabetes Sister     X 3  . Diabetes Brother   . Colon cancer Neg Hx    History  Substance Use Topics  . Smoking status: Former Smoker -- 0.25 packs/day  for 25 years    Types: Cigarettes    Quit date: 05/19/2013  . Smokeless tobacco: Never Used  . Alcohol Use: No    Review of Systems ROS: Statement: All systems negative except as marked or noted in the HPI; Constitutional: Negative for fever and chills. ; ; Eyes: Negative for eye pain, redness and discharge. ; ; ENMT: Negative for ear pain, hoarseness, nasal congestion, sinus pressure and sore throat. ; ; Cardiovascular: Negative for chest pain, palpitations, diaphoresis, dyspnea and peripheral edema. ; ; Respiratory: Negative for cough, wheezing and stridor. ; ; Gastrointestinal: +N/V/D. Negative for abdominal pain, blood in stool, hematemesis, jaundice and rectal bleeding. . ; ; Genitourinary: Negative for dysuria, flank pain and hematuria. ; ; Musculoskeletal: Negative for back pain and neck pain. Negative for swelling and trauma.; ; Skin: Negative for pruritus, rash, abrasions, blisters, bruising and skin lesion.; ; Neuro: Negative for headache, lightheadedness and neck stiffness. Negative for weakness, altered level of consciousness , altered mental status, extremity weakness, paresthesias, involuntary movement, seizure and syncope.      Allergies  Ace inhibitors; Cephalexin; Etodolac; and Gabapentin  Home Medications   Prior to Admission medications  Medication Sig Start Date End Date Taking? Authorizing Provider  atorvastatin (LIPITOR) 80 MG tablet Take 80 mg by mouth every morning.    Yes Historical Provider, MD  buPROPion (WELLBUTRIN SR) 150 MG 12 hr tablet Take 150 mg by mouth every morning.    Yes Historical Provider, MD  DEXILANT 60 MG capsule TAKE ONE CAPSULE BY MOUTH DAILY 07/31/14  Yes Ladene Artist, MD  diphenoxylate-atropine (LOMOTIL) 2.5-0.025 MG per tablet TAKE 2 TABLETS BY MOUTH 4 TIMES DAILY AS NEEDED FOR DIARRHEA OR LOOSE STOOLS 11/22/14  Yes Curt Bears, MD  feeding supplement, ENSURE COMPLETE, (ENSURE COMPLETE) LIQD Take 237 mLs by mouth 2 (two) times daily between  meals. 05/17/14  Yes Robbie Lis, MD  glycopyrrolate (ROBINUL) 2 MG tablet TAKE 1 TABLET (2 MG TOTAL) BY MOUTH 2 (TWO) TIMES DAILY. 08/01/14  Yes Ladene Artist, MD  loperamide (IMODIUM A-D) 2 MG tablet Take 2 mg by mouth 4 (four) times daily as needed for diarrhea or loose stools.   Yes Historical Provider, MD  oxyCODONE-acetaminophen (PERCOCET/ROXICET) 5-325 MG per tablet Take 1 tablet by mouth every 6 (six) hours as needed for severe pain. 11/01/14  Yes Maryanna Shape, NP  Polyethylene Glycol 3350 (MIRALAX PO) Take 1 scoop by mouth daily as needed (for constipation).    Yes Historical Provider, MD  potassium chloride SA (K-DUR,KLOR-CON) 20 MEQ tablet Take 20 mEq by mouth 2 (two) times daily.   Yes Historical Provider, MD  prochlorperazine (COMPAZINE) 10 MG tablet Take 1 tablet (10 mg total) by mouth every 6 (six) hours as needed for nausea or vomiting. 12/13/14  Yes Laurie Panda, NP  CALCIUM PO Take 1 tablet by mouth 3 (three) times daily. Starting 06/21/14--taking '600mg'$  three times daily    Historical Provider, MD  carbidopa-levodopa (SINEMET IR) 25-100 MG per tablet Take 1 tablet by mouth 3 (three) times daily. 10/18/14   Eustace Quail Tat, DO  hydrochlorothiazide (HYDRODIURIL) 12.5 MG tablet Take 12.5 mg by mouth daily.  11/29/14   Historical Provider, MD  lidocaine-prilocaine (EMLA) cream Apply 1 application topically as needed. Apply to port 1 hr before chemo 04/11/14   Curt Bears, MD  LORazepam (ATIVAN) 0.5 MG tablet Take 1 tablet (0.5 mg total) by mouth every 8 (eight) hours. Patient not taking: Reported on 12/23/2014 07/11/14   Curt Bears, MD  magnesium oxide (MAG-OX) 400 (241.3 MG) MG tablet Take 1 tablet (400 mg total) by mouth 4 (four) times daily. Patient not taking: Reported on 12/23/2014 11/23/14   Curt Bears, MD  metoprolol (TOPROL-XL) 50 MG 24 hr tablet Take 50 mg by mouth every morning.     Historical Provider, MD  ondansetron (ZOFRAN) 8 MG tablet Take 1 tablet (8 mg total)  by mouth every 8 (eight) hours as needed for nausea or vomiting. 11/01/14   Maryanna Shape, NP  potassium chloride SA (K-DUR,KLOR-CON) 20 MEQ tablet Take 1 tablet (20 mEq total) by mouth 2 (two) times daily. Patient taking differently: Take 20 mEq by mouth daily.  11/23/14 12/13/14  Curt Bears, MD  rOPINIRole (REQUIP) 1 MG tablet TAKE 1 TABLET (1 MG TOTAL) BY MOUTH 3 (THREE) TIMES DAILY. Patient not taking: Reported on 12/23/2014 11/03/14   Wells Guiles S Tat, DO   BP 105/58 mmHg  Pulse 109  Temp(Src) 98.6 F (37 C) (Oral)  Resp 14  SpO2 96%   16:33 Orthostatic Vital Signs RB  Orthostatic Lying  - BP- Lying: 130/61 mmHg ; Pulse- Lying: 91  Orthostatic Sitting - BP- Sitting: 130/56 mmHg ; Pulse- Sitting: 95  Orthostatic Standing at 0 minutes - BP- Standing at 0 minutes: 110/58 mmHg ; Pulse- Standing at 0 minutes: 99     Physical Exam  1525: Physical examination:  Nursing notes reviewed; Vital signs and O2 SAT reviewed;  Constitutional: Well developed, Well nourished, In no acute distress; Head:  Normocephalic, atraumatic; Eyes: EOMI, PERRL, No scleral icterus; ENMT: Mouth and pharynx normal, Mucous membranes dry; Neck: Supple, Full range of motion, No lymphadenopathy; Cardiovascular: Tachycardic rate and rhythm, No gallop; Respiratory: Breath sounds clear & equal bilaterally, No wheezes.  Speaking full sentences with ease, Normal respiratory effort/excursion; Chest: Nontender, Movement normal; Abdomen: Soft, Nontender, Nondistended, Normal bowel sounds; Genitourinary: No CVA tenderness; Extremities: Pulses normal, No tenderness, No edema, No calf edema or asymmetry.; Neuro: AA&Ox3, Major CN grossly intact.  Speech clear. No gross focal motor or sensory deficits in extremities.; Skin: Color normal, Warm, Dry.   ED Course  Procedures      EKG Interpretation None      MDM  MDM Reviewed: previous chart, nursing note and vitals Reviewed previous: labs Interpretation: labs and x-ray Total  time providing critical care: 30-74 minutes. This excludes time spent performing separately reportable procedures and services. Consults: admitting MD   CRITICAL CARE Performed by: Alfonzo Feller Total critical care time: 35 Critical care time was exclusive of separately billable procedures and treating other patients. Critical care was necessary to treat or prevent imminent or life-threatening deterioration. Critical care was time spent personally by me on the following activities: development of treatment plan with patient and/or surrogate as well as nursing, discussions with consultants, evaluation of patient's response to treatment, examination of patient, obtaining history from patient or surrogate, ordering and performing treatments and interventions, ordering and review of laboratory studies, ordering and review of radiographic studies, pulse oximetry and re-evaluation of patient's condition.   Results for orders placed or performed during the hospital encounter of 12/23/14  Comprehensive metabolic panel  Result Value Ref Range   Sodium 122 (L) 135 - 145 mmol/L   Potassium 4.0 3.5 - 5.1 mmol/L   Chloride 84 (L) 101 - 111 mmol/L   CO2 25 22 - 32 mmol/L   Glucose, Bld 88 65 - 99 mg/dL   BUN 21 (H) 6 - 20 mg/dL   Creatinine, Ser 1.11 (H) 0.44 - 1.00 mg/dL   Calcium 5.9 (LL) 8.9 - 10.3 mg/dL   Total Protein 6.8 6.5 - 8.1 g/dL   Albumin 4.0 3.5 - 5.0 g/dL   AST 16 15 - 41 U/L   ALT 8 (L) 14 - 54 U/L   Alkaline Phosphatase 71 38 - 126 U/L   Total Bilirubin 1.6 (H) 0.3 - 1.2 mg/dL   GFR calc non Af Amer 48 (L) >60 mL/min   GFR calc Af Amer 56 (L) >60 mL/min   Anion gap 13 5 - 15  Lipase, blood  Result Value Ref Range   Lipase 17 (L) 22 - 51 U/L  CBC with Differential  Result Value Ref Range   WBC 7.2 4.0 - 10.5 K/uL   RBC 3.33 (L) 3.87 - 5.11 MIL/uL   Hemoglobin 10.0 (L) 12.0 - 15.0 g/dL   HCT 28.1 (L) 36.0 - 46.0 %   MCV 84.4 78.0 - 100.0 fL   MCH 30.0 26.0 - 34.0 pg    MCHC 35.6 30.0 - 36.0 g/dL   RDW 15.6 (H) 11.5 - 15.5 %   Platelets 86 (  L) 150 - 400 K/uL   Neutrophils Relative % 87 (H) 43 - 77 %   Lymphocytes Relative 8 (L) 12 - 46 %   Monocytes Relative 5 3 - 12 %   Eosinophils Relative 0 0 - 5 %   Basophils Relative 0 0 - 1 %   Neutro Abs 6.2 1.7 - 7.7 K/uL   Lymphs Abs 0.6 (L) 0.7 - 4.0 K/uL   Monocytes Absolute 0.4 0.1 - 1.0 K/uL   Eosinophils Absolute 0.0 0.0 - 0.7 K/uL   Basophils Absolute 0.0 0.0 - 0.1 K/uL   WBC Morphology MILD LEFT SHIFT (1-5% METAS, OCC MYELO, OCC BANDS)    Smear Review PLATELET COUNT CONFIRMED BY SMEAR   I-Stat CG4 Lactic Acid, ED  Result Value Ref Range   Lactic Acid, Venous 1.10 0.5 - 2.0 mmol/L   Ct Abdomen Pelvis W Contrast 12/11/2014   CLINICAL DATA:  Subsequent encounter for small-cell lung cancer.  EXAM: CT CHEST, ABDOMEN, AND PELVIS WITH CONTRAST  TECHNIQUE: Multidetector CT imaging of the chest, abdomen and pelvis was performed following the standard protocol during bolus administration of intravenous contrast.  CONTRAST:  150m OMNIPAQUE IOHEXOL 300 MG/ML  SOLN  COMPARISON:  10/13/2014.  FINDINGS: CT CHEST FINDINGS  Mediastinum/Nodes: Right-sided Port-A-Cath tip is positioned at the junction of the SVC and RA. There is no axillary lymphadenopathy. 15 mm precarinal lymph node measured on the previous study is now 13 mm in short axis. 21 mm right hilar lymph node measured previously is canal 18 mm in the same dimension and measures 12 mm in short axis. 16 mm short axis subcarinal lymph node measured on the previous study is now 10 mm in short axis. No left hilar lymphadenopathy. Heart size is normal. No pericardial effusion.  The 11 x 18 mm paraspinal nodule at the T11 level now measures 16 x 11 mm. A more ill-defined right paraspinal plaque-like lesion (image 42 series 2) measures 8 x 30 mm today compared to 12 x 31 mm previously.  Lungs/Pleura: Emphysematous changes noted in the lungs bilaterally. Tree in bud nodularity  seen in the posterior right upper lobe previously is again noted but appears slightly decreased in the interval. The dominant 7 mm nodule seen in this region on the prior study is now 5 mm. 3 mm nodule along the anterior aspect of the right major fissure was 4-5 mm previously. Previously described subpleural nodule within the right lower lobe (image 31 series 4 today) is not substantially changed in the interval measuring 6 x 19 mm compared to 6 x 20 mm previously. This is in the region of the prominent bony spur and pathologic vertebral collapse. Other scattered smaller pleural nodules are seen along the right major fissure. No suspicious nodule or mass in the left lung. There is no pulmonary edema pleural effusion. No focal airspace consolidation.  Musculoskeletal: Multiple sclerotic lesions are seen throughout the thoracic spine. Compression deformity of the T9 vertebral body is stable and likely pathologic in nature.  CT ABDOMEN AND PELVIS FINDINGS  Hepatobiliary: Stable mild prominence of the intrahepatic biliary ducts. Tiny 6 mm hypo attenuating lesion in the posterior right liver (image 52 series 2) is stable in the interval. No other focal liver lesion is evident. Gallbladder is surgically absent. No dilatation of the extrahepatic bile ducts.  Pancreas: No focal mass lesion. No dilatation of the main duct. No intraparenchymal cyst. No peripancreatic edema.  Spleen: No splenomegaly. No focal mass lesion.  Adrenals/Urinary Tract: Stable 11  mm right adrenal nodule. No discrete left adrenal nodule. Areas of cortical scarring are seen in the kidneys bilaterally. 2.6 cm cyst identified at the lower pole the right kidney. 3.7 cm cyst identified in the lower pole of the left kidney. Other smaller cysts are noted in the kidneys bilaterally.  Stomach/Bowel: Stomach is nondistended. No gastric wall thickening. No evidence of outlet obstruction. A very large duodenum diverticulum is evident. No small bowel wall  thickening. No small bowel dilatation. Terminal ileum is normal. The appendix is normal. Prominent diffuse stool volume raises the question of, but is not diagnostic for constipation. There is left colonic diverticulosis without diverticulitis.  Vascular/Lymphatic: There is abdominal aortic atherosclerosis without aneurysm. Infrarenal abdominal aorta measures up to 2.9 cm in diameter which is upper normal. No gastrohepatic or hepatoduodenal ligament lymphadenopathy. The portal vein is patent. No retroperitoneal lymphadenopathy. No pelvic sidewall lymphadenopathy.  Reproductive: Uterus is surgically absent.  No adnexal mass.  Other: No intraperitoneal free fluid.  Musculoskeletal: Numerous sclerotic lesions are seen involving bony anatomy of the pelvis. These appear relatively stable in the interval with no definite new lesion or progressive lesion evident.  IMPRESSION: 1. Interval decrease in mediastinal lymphadenopathy. Small pleural-based and paraspinal enhancing nodules are also decreased. 2. Multiple pulmonary nodules again identified, as before. The show slight interval decrease in size. No new or progressive pulmonary nodule is evident. 3. Stable 11 mm right adrenal nodule. 4. No new or progressive disease in the abdomen or pelvis to suggest metastatic progression. 5. Numerous sclerotic bony metastases with stable compression deformity of the T9 vertebral body, likely pathologic. Bony metastatic involvement does not appear progressed in the interval.   Electronically Signed   By: Misty Stanley M.D.   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   Normal heart size, mediastinal contours and pulmonary vascularity.  Known mediastinal and RIGHT hilar adenopathy not well visualized radiographically.  Atherosclerotic calcifications aorta.  Lungs emphysematous with mild central peribronchial thickening.  No infiltrate, pleural effusion or pneumothorax.  Normal bowel gas pattern.  No bowel dilatation or bowel wall thickening, or free intraperitoneal air.  Prior L4-S1 fusion.  Degenerative disc disease changes thoracic spine.  No definite urinary tract calcification.  Diffuse osseous metastases.  IMPRESSION: No acute abnormalities.  Emphysematous changes.  Osseous metastatic disease.   Electronically Signed   By: Lavonia Dana M.D.   On: 12/23/2014 16:21    1715:  Orthostatic on VS. Worsening hyponatremia and increased BUN/Cr on labs today. IVF continues. Dx and testing d/w pt and family.  Questions answered.  Verb understanding, agreeable to admit. T/C to [facility] Labs, case discussed, including:  HPI, pertinent PM/SHx, VS/PE, dx testing, ED course and treatment:  Agreeable to admit, requests he will come to the ED for evaluation.     Francine Graven, DO 12/25/14 1440

## 2014-12-23 NOTE — ED Notes (Addendum)
Awake. Verbally responsive. Resp even and unlabored. No audible adventitious breath sounds noted. ABC's intact. Abd soft/nondistended but tender to palpate to epigastic area. BS (+) and hypoactive x4 quadrants. No N/V reported. Pt reported having diarrhea x5 in past 24 hrs. Pt reported having poor appetite.

## 2014-12-23 NOTE — ED Notes (Signed)
Pt from home reports hx of lung and liver CA. Pt received chemo 3 days ago and has had diarrhea since. Pt is not able to eat or drink. Pt is A&O and in NAD

## 2014-12-23 NOTE — ED Notes (Signed)
Pt unable to give urine sample at this time 

## 2014-12-23 NOTE — H&P (Signed)
Patient Demographics  [name], is a 74 y.o. female  MRN: [id number]   DOB - November 24, 1940  Admit Date - 12/23/2014  Outpatient Primary MD for the patient is [name], MD   With History of -  Past Medical History  Diagnosis Date  . GERD (gastroesophageal reflux disease)   . IBS (irritable bowel syndrome)   . Adenomatous colon polyp 06/2009  . Hiatal hernia   . Breast cyst   . Diverticulosis   . Hemorrhoids   . Hyperlipidemia   . Hypertension   . Hyperparathyroidism   . Anxiety   . Migraine   . Osteoporosis   . Vitamin D deficiency   . Spinal stenosis   . C. difficile colitis 2006  . Small cell lung carcinoma dx'd 02/2014  . Bone metastases dx'd 02/2014      Past Surgical History  Procedure Laterality Date  . Cholecystectomy    . Vaginal hysterectomy    . Knee arthroscopy      left  . Parathyroidectomy  2009    [facility]  . Lumbar spine surgery  2001  . Breast biopsy      bilateral  . Total knee arthroplasty      right  . Bladder repair    . Video bronchoscopy Bilateral 03/14/2014    Procedure: VIDEO BRONCHOSCOPY WITHOUT FLUORO;  Surgeon: Tanda Rockers, MD;  Location: [facility];  Service: Cardiopulmonary;  Laterality: Bilateral;    in for   Chief Complaint  Patient presents with  . Diarrhea     HPI  [name]  is a 74 y.o. 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 Earlie Server, patient presents with complaints of nausea, poor oral intake, weakness, and diarrhea for the last 3 days, patient reports she received her chemotherapy on Wednesday, started to have above complaints since, patient reports this is typical for her chemotherapy, but reported area of this time was more persistent than before, in ED patient was a febrile, workup was significant for hyponatremia of sodium of 122, Hypocalcemia with calcium of 5.9, white blood cell of 7.2, mild thrombocytopenia platelet count  of 86, patient was orthostatic in ED, patient reports she has been taking her Lomotil and Imodium as instructed, without much improvement of diarrhea, abdominal x-ray with no acute finding, hospitalist called to admit.    Review of Systems    In addition to the HPI above,  No Fever-chills, but reported cold sweats. No Headache, No changes with Vision or hearing, No problems swallowing food or Liquids, No Chest pain, Cough or Shortness of Breath,  reports abdominal pain, nausea, diarrhea, but no vomiting, which is typical for her post chemotherapy treatment. No Blood in stool or Urine, No dysuria, No new skin rashes or bruises, No new joints pains-aches,  No new weakness, tingling, numbness in any extremity, No recent weight gain or loss, No polyuria, polydypsia or polyphagia, No significant Mental Stressors.  A full 10 point Review of Systems was done, except as stated above, all other Review of Systems were negative.   Social History History  Substance Use Topics  . Smoking status: Former Smoker -- 0.25 packs/day for 25 years    Types: Cigarettes    Quit date: 05/19/2013  . Smokeless tobacco: Never Used  . Alcohol Use: No    Family History Family History  Problem Relation Age of Onset  . Colon polyps Sister   .  Irritable bowel syndrome Sister   . Breast cancer Sister   . Diabetes Sister     X 3  . Diabetes Brother   . Colon cancer Neg Hx     Prior to Admission medications   Medication Sig Start Date End Date Taking? Authorizing Provider  atorvastatin (LIPITOR) 80 MG tablet Take 80 mg by mouth every morning.    Yes Historical Provider, MD  buPROPion (WELLBUTRIN SR) 150 MG 12 hr tablet Take 150 mg by mouth every morning.    Yes Historical Provider, MD  DEXILANT 60 MG capsule TAKE ONE CAPSULE BY MOUTH DAILY 07/31/14  Yes Ladene Artist, MD  diphenoxylate-atropine (LOMOTIL) 2.5-0.025 MG per tablet TAKE 2 TABLETS BY MOUTH 4 TIMES DAILY AS NEEDED FOR DIARRHEA OR LOOSE  STOOLS 11/22/14  Yes Curt Bears, MD  feeding supplement, ENSURE COMPLETE, (ENSURE COMPLETE) LIQD Take 237 mLs by mouth 2 (two) times daily between meals. 05/17/14  Yes Robbie Lis, MD  glycopyrrolate (ROBINUL) 2 MG tablet TAKE 1 TABLET (2 MG TOTAL) BY MOUTH 2 (TWO) TIMES DAILY. 08/01/14  Yes Ladene Artist, MD  loperamide (IMODIUM A-D) 2 MG tablet Take 2 mg by mouth 4 (four) times daily as needed for diarrhea or loose stools.   Yes Historical Provider, MD  oxyCODONE-acetaminophen (PERCOCET/ROXICET) 5-325 MG per tablet Take 1 tablet by mouth every 6 (six) hours as needed for severe pain. 11/01/14  Yes Maryanna Shape, NP  Polyethylene Glycol 3350 (MIRALAX PO) Take 1 scoop by mouth daily as needed (for constipation).    Yes Historical Provider, MD  potassium chloride SA (K-DUR,KLOR-CON) 20 MEQ tablet Take 20 mEq by mouth 2 (two) times daily.   Yes Historical Provider, MD  prochlorperazine (COMPAZINE) 10 MG tablet Take 1 tablet (10 mg total) by mouth every 6 (six) hours as needed for nausea or vomiting. 12/13/14  Yes Laurie Panda, NP  CALCIUM PO Take 1 tablet by mouth 3 (three) times daily. Starting 06/21/14--taking '600mg'$  three times daily    Historical Provider, MD  carbidopa-levodopa (SINEMET IR) 25-100 MG per tablet Take 1 tablet by mouth 3 (three) times daily. 10/18/14   Eustace Quail Tat, DO  hydrochlorothiazide (HYDRODIURIL) 12.5 MG tablet Take 12.5 mg by mouth daily.  11/29/14   Historical Provider, MD  lidocaine-prilocaine (EMLA) cream Apply 1 application topically as needed. Apply to port 1 hr before chemo 04/11/14   Curt Bears, MD  LORazepam (ATIVAN) 0.5 MG tablet Take 1 tablet (0.5 mg total) by mouth every 8 (eight) hours. Patient not taking: Reported on 12/23/2014 07/11/14   Curt Bears, MD  magnesium oxide (MAG-OX) 400 (241.3 MG) MG tablet Take 1 tablet (400 mg total) by mouth 4 (four) times daily. Patient not taking: Reported on 12/23/2014 11/23/14   Curt Bears, MD  metoprolol  (TOPROL-XL) 50 MG 24 hr tablet Take 50 mg by mouth every morning.     Historical Provider, MD  ondansetron (ZOFRAN) 8 MG tablet Take 1 tablet (8 mg total) by mouth every 8 (eight) hours as needed for nausea or vomiting. 11/01/14   Maryanna Shape, NP  potassium chloride SA (K-DUR,KLOR-CON) 20 MEQ tablet Take 1 tablet (20 mEq total) by mouth 2 (two) times daily. Patient taking differently: Take 20 mEq by mouth daily.  11/23/14 12/13/14  Curt Bears, MD  rOPINIRole (REQUIP) 1 MG tablet TAKE 1 TABLET (1 MG TOTAL) BY MOUTH 3 (THREE) TIMES DAILY. Patient not taking: Reported on 12/23/2014 11/03/14   Eustace Quail Tat, DO  Allergies  Allergen Reactions  . Ace Inhibitors     REACTION: causing cough  . Cephalexin Rash  . Etodolac Rash  . Gabapentin Rash    Physical Exam  Vitals  Blood pressure 105/58, pulse 109, temperature 98.6 F (37 C), temperature source Oral, resp. rate 14, SpO2 96 %.   1. General  frail, ill-appearing , thin ,female lying in bed in NAD,    2. Normal affect and insight, Not Suicidal or Homicidal, Awake Alert, Oriented X 3.  3. No F.N deficits, ALL C.Nerves Intact, Strength 5/5 all 4 extremities, Sensation intact all 4 extremities, Plantars down going.  4. Ears and Eyes appear Normal, Conjunctivae clear, PERRLA. Moist Oral Mucosa.  5. Supple Neck, No JVD, No cervical lymphadenopathy appriciated, No Carotid Bruits.  6. Symmetrical Chest wall movement, Good air movement bilaterally, CTAB.  7. RRR, No Gallops, Rubs or Murmurs, No Parasternal Heave.  8. Positive Bowel Sounds, Abdomen Soft, mild tenderness in lower area  No organomegaly appriciated,No rebound -guarding or rigidity.  9.  No Cyanosis, delayed Skin Turgor, No Skin Rash or Bruise.  10. Good muscle tone,  joints appear normal , no effusions, Normal ROM.    Data Review  CBC  Recent Labs Lab 12/20/14 0814 12/23/14 1540  WBC 2.0* 7.2  HGB 9.7* 10.0*  HCT 27.8* 28.1*  PLT 101* 86*  MCV 85.8 84.4    MCH 29.9 30.0  MCHC 34.8 35.6  RDW 16.6* 15.6*  LYMPHSABS 0.7* 0.6*  MONOABS 0.2 0.4  EOSABS 0.1 0.0  BASOSABS 0.0 0.0   ------------------------------------------------------------------------------------------------------------------  Chemistries   Recent Labs Lab 12/20/14 0814 12/23/14 1540  NA 128* 122*  K 4.4 4.0  CL  --  84*  CO2 28 25  GLUCOSE 96 88  BUN 11.4 21*  CREATININE 0.8 1.11*  CALCIUM 7.3* 5.9*  AST 11 16  ALT 8 8*  ALKPHOS 70 71  BILITOT 0.87 1.6*   ------------------------------------------------------------------------------------------------------------------ estimated creatinine clearance is 38.4 mL/min (by C-G formula based on Cr of 1.11). ------------------------------------------------------------------------------------------------------------------ No results for input(s): TSH, T4TOTAL, T3FREE, THYROIDAB in the last 72 hours.  Invalid input(s): FREET3   Coagulation profile No results for input(s): INR, PROTIME in the last 168 hours. ------------------------------------------------------------------------------------------------------------------- No results for input(s): DDIMER in the last 72 hours. -------------------------------------------------------------------------------------------------------------------  Cardiac Enzymes No results for input(s): CKMB, TROPONINI, MYOGLOBIN in the last 168 hours.  Invalid input(s): CK ------------------------------------------------------------------------------------------------------------------ Invalid input(s): POCBNP   ---------------------------------------------------------------------------------------------------------------  Urinalysis    Component Value Date/Time   COLORURINE AMBER* 05/13/2014 1936   APPEARANCEUR HAZY* 05/13/2014 1936   LABSPEC 1.017 05/13/2014 1936   PHURINE 7.5 05/13/2014 1936   GLUCOSEU NEGATIVE 05/13/2014 1936   HGBUR NEGATIVE 05/13/2014 1936   BILIRUBINUR  SMALL* 05/13/2014 1936   KETONESUR NEGATIVE 05/13/2014 1936   PROTEINUR 30* 05/13/2014 1936   UROBILINOGEN 1.0 05/13/2014 1936   NITRITE NEGATIVE 05/13/2014 1936   LEUKOCYTESUR SMALL* 05/13/2014 1936    ----------------------------------------------------------------------------------------------------------------  Imaging results:   Dg Abd Acute W/chest  12/23/2014   CLINICAL DATA:  Small cell carcinoma of the lung with osseous metastases, received chemotherapy 3 days ago, diarrhea since, unable to eat or drink, RIGHT lower abdominal pain  EXAM: DG ABDOMEN ACUTE W/ 1V CHEST  COMPARISON:  Chest radiograph 02/24/2014; CT chest abdomen pelvis 12/11/2014  FINDINGS: RIGHT jugular Port-A-Cath with tip projecting over SVC.  Normal heart size, mediastinal contours and pulmonary vascularity.  Known mediastinal and RIGHT hilar adenopathy not well visualized radiographically.  Atherosclerotic calcifications aorta.  Lungs  emphysematous with mild central peribronchial thickening.  No infiltrate, pleural effusion or pneumothorax.  Normal bowel gas pattern.  No bowel dilatation or bowel wall thickening, or free intraperitoneal air.  Prior L4-S1 fusion.  Degenerative disc disease changes thoracic spine.  No definite urinary tract calcification.  Diffuse osseous metastases.  IMPRESSION: No acute abnormalities.  Emphysematous changes.  Osseous metastatic disease.   Electronically Signed   By: Lavonia Dana M.D.   On: 12/23/2014 16:21       Assessment & Plan  Principal Problem:   Hyponatremia Active Problems:   GERD   Small cell carcinoma of lung   Nausea with vomiting   Dehydration   Diarrhea   Thrombocytopenia   Hypocalcemia    Dehydration  - This is evident by patient lab, orthostasis, and clinical exam  - Secondary to diarrhea, poor oral intake from chemotherapy . - Continue with IV fluids .  Diarrhea, nausea - This is related to her chemotherapy, continue with Imodium and Lomotil - Continue  with IV fluids  Hyponatremia  - Patient with known chronic hyponatremia, most likely related to her lung cancer , at this point appear to be worsened due to volume depletion and hypovolemia, we'll continue with IV normal saline, check BMP in a.m.Marland Kitchen  Hypocalcemia - Will start on oral potassium supplement, will give 2 g of IV calcium gluconate initially.   GERD - Continue with PPI  Small cell carcinoma of the lung - Patient is followed by Dr. Earlie Server as an outpatient, will tag  to his list.   thrombocytopenia  - Mild, continue to monitor , to continue to chemotherapy .  DVT Prophylaxis Heparin -    AM Labs Ordered, also please review Full Orders  Family Communication: Admission, patients condition and plan of care including tests being ordered have been discussed with the patient and husband and wife who indicate understanding and agree with the plan and Code Status.  Code Status DNR  Likely DC to  home  Condition GUARDED    Time spent in minutes : 55 minutes    Brannon Levene M.D on 12/23/2014 at 5:56 PM  Between 7am to 7pm - Pager - [phone]  After 7pm go to [email or web address] - password [email or web address]  And look for the night coverage person covering me after hours  [facility] Office  [phone]

## 2014-12-24 LAB — CBC
HCT: 24.8 % — ABNORMAL LOW (ref 36.0–46.0)
HEMOGLOBIN: 8.9 g/dL — AB (ref 12.0–15.0)
MCH: 30.5 pg (ref 26.0–34.0)
MCHC: 35.9 g/dL (ref 30.0–36.0)
MCV: 84.9 fL (ref 78.0–100.0)
PLATELETS: 55 10*3/uL — AB (ref 150–400)
RBC: 2.92 MIL/uL — ABNORMAL LOW (ref 3.87–5.11)
RDW: 15.7 % — AB (ref 11.5–15.5)
WBC: 4.4 10*3/uL (ref 4.0–10.5)

## 2014-12-24 LAB — OSMOLALITY: Osmolality: 260 mOsm/kg — ABNORMAL LOW (ref 275–300)

## 2014-12-24 LAB — COMPREHENSIVE METABOLIC PANEL
ALT: 8 U/L — AB (ref 14–54)
AST: 13 U/L — AB (ref 15–41)
Albumin: 3.5 g/dL (ref 3.5–5.0)
Alkaline Phosphatase: 61 U/L (ref 38–126)
Anion gap: 8 (ref 5–15)
BILIRUBIN TOTAL: 1.1 mg/dL (ref 0.3–1.2)
BUN: 18 mg/dL (ref 6–20)
CALCIUM: 6.3 mg/dL — AB (ref 8.9–10.3)
CO2: 23 mmol/L (ref 22–32)
Chloride: 92 mmol/L — ABNORMAL LOW (ref 101–111)
Creatinine, Ser: 0.87 mg/dL (ref 0.44–1.00)
GFR calc Af Amer: >60 mL/min (ref >60)
GLUCOSE: 87 mg/dL (ref 65–99)
POTASSIUM: 3.4 mmol/L — AB (ref 3.5–5.1)
Sodium: 123 mmol/L — ABNORMAL LOW (ref 135–145)
Total Protein: 5.9 g/dL — ABNORMAL LOW (ref 6.5–8.1)

## 2014-12-24 LAB — URINALYSIS, ROUTINE W REFLEX MICROSCOPIC
Bilirubin Urine: NEGATIVE
Glucose, UA: NEGATIVE mg/dL
HGB URINE DIPSTICK: NEGATIVE
KETONES UR: NEGATIVE mg/dL
Leukocytes, UA: NEGATIVE
NITRITE: NEGATIVE
PH: 5 (ref 5.0–8.0)
PROTEIN: NEGATIVE mg/dL
Specific Gravity, Urine: 1.012 (ref 1.005–1.030)
UROBILINOGEN UA: 0.2 mg/dL (ref 0.0–1.0)

## 2014-12-24 LAB — C DIFFICILE QUICK SCREEN W PCR REFLEX
C DIFFICILE (CDIFF) INTERP: NEGATIVE
C Diff antigen: NEGATIVE
C Diff toxin: NEGATIVE

## 2014-12-24 LAB — OSMOLALITY, URINE: OSMOLALITY UR: 397 mosm/kg (ref 390–1090)

## 2014-12-24 LAB — SODIUM, URINE, RANDOM: Sodium, Ur: 101 mmol/L

## 2014-12-24 MED ORDER — SODIUM CHLORIDE 0.9 % IV SOLN
1.0000 g | Freq: Once | INTRAVENOUS | Status: AC
  Administered 2014-12-24: 1 g via INTRAVENOUS
  Filled 2014-12-24: qty 10

## 2014-12-24 MED ORDER — POTASSIUM CHLORIDE CRYS ER 20 MEQ PO TBCR
40.0000 meq | EXTENDED_RELEASE_TABLET | Freq: Once | ORAL | Status: AC
  Administered 2014-12-24: 40 meq via ORAL
  Filled 2014-12-24: qty 2

## 2014-12-24 MED ORDER — POTASSIUM CHLORIDE 20 MEQ PO PACK
40.0000 meq | PACK | Freq: Once | ORAL | Status: DC
  Filled 2014-12-24: qty 2

## 2014-12-24 MED ORDER — SODIUM CHLORIDE 1 G PO TABS
2.0000 g | ORAL_TABLET | Freq: Once | ORAL | Status: AC
  Administered 2014-12-24: 2 g via ORAL
  Filled 2014-12-24: qty 2

## 2014-12-24 MED ORDER — SODIUM CHLORIDE 0.9 % IV SOLN
2.0000 g | Freq: Once | INTRAVENOUS | Status: AC
  Administered 2014-12-24: 2 g via INTRAVENOUS
  Filled 2014-12-24: qty 20

## 2014-12-24 NOTE — Evaluation (Signed)
Physical Therapy Evaluation Patient Details Name: [name] MRN: [id number] DOB: 10/28/1940 Today's Date: 12/24/2014   History of Present Illness  74 y/o female admitted with hyponatremia.  PMH includes small cell lung CA with mets to bone and Parkinson's  Clinical Impression  Pt admitted with above diagnosis. Pt currently with functional limitations due to the deficits listed below (see PT Problem List).  Pt will benefit from skilled PT to increase their independence and safety with mobility to allow discharge to the venue listed below.  Pt ambulated min/guard to S level with RW 175' with some tremors noted due to Parkinson's.  Pt has good family support and DME.  No needs post acute care identified, but will follow acutely.     Follow Up Recommendations No PT follow up    Equipment Recommendations  None recommended by PT    Recommendations for Other Services       Precautions / Restrictions Precautions Precautions: Fall      Mobility  Bed Mobility Overal bed mobility: Modified Independent                Transfers Overall transfer level: Needs assistance Equipment used: Rolling walker (2 wheeled) Transfers: Sit to/from Stand Sit to Stand: Supervision         General transfer comment: cues for hand placement  Ambulation/Gait Ambulation/Gait assistance: Min guard;Supervision Ambulation Distance (Feet): 175 Feet Assistive device: Rolling walker (2 wheeled) Gait Pattern/deviations: Step-through pattern     General Gait Details: Initiallly ambulated with min/guard and progressed to S with RW.  Stairs            Wheelchair Mobility    Modified Rankin (Stroke Patients Only)       Balance Overall balance assessment: Needs assistance   Sitting balance-Leahy Scale: Good       Standing balance-Leahy Scale: Fair                               Pertinent Vitals/Pain Pain Assessment: 0-10 Pain Score: 7  Pain Location: R back Pain  Descriptors / Indicators: Aching Pain Intervention(s): Repositioned    Home Living Family/patient expects to be discharged to:: Private residence Living Arrangements: Spouse/significant other;Children   Type of Home: House Home Access: Stairs to enter Entrance Stairs-Rails: Right Entrance Stairs-Number of Steps: 6 Home Layout: One level Home Equipment: Environmental consultant - 2 wheels;Cane - single point;Shower seat;Bedside commode;Wheelchair - [employer] - 4 wheels      Prior Function Level of Independence: Independent with assistive device(s)         Comments: Amb with cane     Hand Dominance   Dominant Hand: Right    Extremity/Trunk Assessment   Upper Extremity Assessment: Overall WFL for tasks assessed           Lower Extremity Assessment: Overall WFL for tasks assessed         Communication   Communication: No difficulties  Cognition Arousal/Alertness: Awake/alert Behavior During Therapy: WFL for tasks assessed/performed Overall Cognitive Status: Within Functional Limits for tasks assessed                      General Comments General comments (skin integrity, edema, etc.): Discussed stairs with husband and he explained how he assists her at home and he is knowledgeable of safest technique.    Exercises        Assessment/Plan    PT Assessment Patient needs continued PT  services  PT Diagnosis Difficulty walking;Generalized weakness   PT Problem List Decreased activity tolerance;Decreased balance;Decreased mobility;Decreased knowledge of use of DME;Decreased strength  PT Treatment Interventions Gait training;Stair training;Functional mobility training;Therapeutic activities;Therapeutic exercise;DME instruction;Balance training   PT Goals (Current goals can be found in the Care Plan section) Acute Rehab PT Goals Patient Stated Goal: go home PT Goal Formulation: With patient Time For Goal Achievement: 01/07/15 Potential to Achieve Goals: Good     Frequency Min 3X/week   Barriers to discharge        Co-evaluation               End of Session Equipment Utilized During Treatment: Gait belt Activity Tolerance: Patient tolerated treatment well Patient left: Other (comment);with family/visitor present (in bathroom) Nurse Communication: Mobility status;Other (comment) (request for K pad)         Time: 1103-1594 PT Time Calculation (min) (ACUTE ONLY): 24 min   Charges:   PT Evaluation $Initial PT Evaluation Tier I: 1 Procedure PT Treatments $Gait Training: 8-22 mins   PT G Codes:        Arturo Sofranko LUBECK 12/24/2014, 5:11 PM

## 2014-12-24 NOTE — Progress Notes (Signed)
CRITICAL VALUE ALERT  Critical value received:  Calcium 6.3  Date of notification:  12/24/2014  Critical value read back:Yes.    Nurse who received alert:  Philemon Kingdom D   MD notified (1st page):  Ivor Costa, NP  Time of first page:  0533  MD notified (2nd page):  Time of second page:  Responding MD:    Time MD responded:

## 2014-12-24 NOTE — Progress Notes (Signed)
Patient Demographics  [name], is a 74 y.o. female, DOB - 1940-08-09, HMC:[id number]  Admit date - 12/23/2014   Admitting Physician Albertine Patricia, MD  Outpatient Primary MD for the patient is [name], MD  LOS - 1   Chief Complaint  Patient presents with  . Diarrhea       Admission HPI/Brief narrative: 74 y.o. 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 Earlie Server, on chemotherapy most recently last Wednesday, follow-up diarrhea, dehydration, after her treatment.  Subjective:   [name] today has, No headache, No chest pain,  - No Nausereports generalized weakness, no further diarrhea since yesterday evening. Assessment & Plan    Principal Problem:   Hyponatremia Active Problems:   GERD   Small cell carcinoma of lung   Nausea with vomiting   Dehydration   Diarrhea   Thrombocytopenia   Hypocalcemia  Dehydration  - Secondary to diarrhea, poor oral intake from chemotherapy . - Continue with IV fluids .  Diarrhea, nausea - This is related to her chemotherapy, continue with Imodium and Lomotil as needed - C. difficile is negative - Continue with IV fluids  Hyponatremia  - Patient with known chronic hyponatremia, most likely related to her lung cancer ,  worsened due to volume depletion and hypovolemia, we'll continue with IV normal saline, will encourage to drink Gatorade , check an a.m. Marland Kitchen  Hypokalemia  - Repleted   Hypocalcemia -continue with oral supplement,  him on a dose of 2 g IV calcium gluconate today as well /  GERD - Continue with PPI  Small cell carcinoma of the lung - Patient is followed by Dr. Earlie Server as an outpatient,   thrombocytopenia  -  worsening today most likely due to effect of chemotherapy, and hydration , will stop subcutaneous heparin .  tremors - Continue with carbidopa-levodopa    Code Status: DNR  Family Communication: none at bedside  Disposition Plan: home in 24-48 hours after appropriately hydrated    Procedures  None   Consults   None    Medications  Scheduled Meds: . atorvastatin  80 mg Oral QPC supper  . buPROPion  150 mg Oral q morning - 10a  . calcium carbonate  500 mg of elemental calcium Oral TID  . carbidopa-levodopa  1 tablet Oral TID WC  . feeding supplement (ENSURE ENLIVE)  237 mL Oral BID BM  . glycopyrrolate  2 mg Oral BID  . pantoprazole  40 mg Oral Daily  . potassium chloride SA  20 mEq Oral BID  . sodium chloride  3 mL Intravenous Q12H   Continuous Infusions: . sodium chloride 100 mL/hr at 12/24/14 0625   PRN Meds:.acetaminophen **OR** acetaminophen, albuterol, bisacodyl, diphenoxylate-atropine, HYDROcodone-acetaminophen, loperamide, ondansetron, ondansetron **OR** ondansetron (ZOFRAN) IV, oxyCODONE-acetaminophen, polyethylene glycol, prochlorperazine, sodium chloride  DVT Prophylaxis  - SCDs  Lab Results  Component Value Date   PLT 55* 12/24/2014    Antibiotics   Anti-infectives    None          Objective:   Filed Vitals:   12/23/14 1430 12/23/14 1830 12/23/14 2026 12/24/14 0531  BP:  134/52 124/54 128/66  Pulse:  102 92 88  Temp:  98.4 F (36.9 C) 98.6 F (37 C) 98.5 F (36.9 C)  TempSrc:   Oral Oral  Resp:  '14 16 16  '$ Height:  '5\' 5"'$  (1.651 m)    Weight:  51.5 kg (113 lb 8.6 oz)    SpO2: 96% 98% 100% 100%    Wt Readings from Last 3 Encounters:  12/23/14 51.5 kg (113 lb 8.6 oz)  12/13/14 53.933 kg (118 lb 14.4 oz)  11/15/14 53.57 kg (118 lb 1.6 oz)     Intake/Output Summary (Last 24 hours) at 12/24/14 1341 Last data filed at 12/24/14 1200  Gross per 24 hour  Intake 1861.67 ml  Output   1119 ml  Net 742.67 ml     Physical Exam  Awake Alert, Oriented X 3, [place].AT,PERRAL Supple neck ,dry oral mucosa  Symmetrical Chest wall movement, Good air movement bilaterally,  RRR,No Gallops,Rubs or  new Murmurs, No Parasternal Heave +ve B.Sounds, Abd Soft, No tenderness, No organomegaly appriciated, No rebound - guarding or rigidity. No Cyanosis, Clubbing or edema, No new Rash or bruise  , + tremors    Data Review   Micro Results Recent Results (from the past 240 hour(s))  C difficile quick scan w PCR reflex     Status: None   Collection Time: 12/24/14  9:20 AM  Result Value Ref Range Status   C Diff antigen NEGATIVE NEGATIVE Final   C Diff toxin NEGATIVE NEGATIVE Final   C Diff interpretation Negative for toxigenic C. difficile  Final    Radiology Reports Ct Chest W Contrast  12/11/2014   CLINICAL DATA:  Subsequent encounter for small-cell lung cancer.  EXAM: CT CHEST, ABDOMEN, AND PELVIS WITH CONTRAST  TECHNIQUE: Multidetector CT imaging of the chest, abdomen and pelvis was performed following the standard protocol during bolus administration of intravenous contrast.  CONTRAST:  110m OMNIPAQUE IOHEXOL 300 MG/ML  SOLN  COMPARISON:  10/13/2014.  FINDINGS: CT CHEST FINDINGS  Mediastinum/Nodes: Right-sided Port-A-Cath tip is positioned at the junction of the SVC and RA. There is no axillary lymphadenopathy. 15 mm precarinal lymph node measured on the previous study is now 13 mm in short axis. 21 mm right hilar lymph node measured previously is canal 18 mm in the same dimension and measures 12 mm in short axis. 16 mm short axis subcarinal lymph node measured on the previous study is now 10 mm in short axis. No left hilar lymphadenopathy. Heart size is normal. No pericardial effusion.  The 11 x 18 mm paraspinal nodule at the T11 level now measures 16 x 11 mm. A more ill-defined right paraspinal plaque-like lesion (image 42 series 2) measures 8 x 30 mm today compared to 12 x 31 mm previously.  Lungs/Pleura: Emphysematous changes noted in the lungs bilaterally. Tree in bud nodularity seen in the posterior right upper lobe previously is again noted but appears slightly decreased in the interval.  The dominant 7 mm nodule seen in this region on the prior study is now 5 mm. 3 mm nodule along the anterior aspect of the right major fissure was 4-5 mm previously. Previously described subpleural nodule within the right lower lobe (image 31 series 4 today) is not substantially changed in the interval measuring 6 x 19 mm compared to 6 x 20 mm previously. This is in the region of the prominent bony spur and pathologic vertebral collapse. Other scattered smaller pleural nodules are seen along the right major fissure. No suspicious nodule or mass in the left lung. There is no pulmonary  edema pleural effusion. No focal airspace consolidation.  Musculoskeletal: Multiple sclerotic lesions are seen throughout the thoracic spine. Compression deformity of the T9 vertebral body is stable and likely pathologic in nature.  CT ABDOMEN AND PELVIS FINDINGS  Hepatobiliary: Stable mild prominence of the intrahepatic biliary ducts. Tiny 6 mm hypo attenuating lesion in the posterior right liver (image 52 series 2) is stable in the interval. No other focal liver lesion is evident. Gallbladder is surgically absent. No dilatation of the extrahepatic bile ducts.  Pancreas: No focal mass lesion. No dilatation of the main duct. No intraparenchymal cyst. No peripancreatic edema.  Spleen: No splenomegaly. No focal mass lesion.  Adrenals/Urinary Tract: Stable 11 mm right adrenal nodule. No discrete left adrenal nodule. Areas of cortical scarring are seen in the kidneys bilaterally. 2.6 cm cyst identified at the lower pole the right kidney. 3.7 cm cyst identified in the lower pole of the left kidney. Other smaller cysts are noted in the kidneys bilaterally.  Stomach/Bowel: Stomach is nondistended. No gastric wall thickening. No evidence of outlet obstruction. A very large duodenum diverticulum is evident. No small bowel wall thickening. No small bowel dilatation. Terminal ileum is normal. The appendix is normal. Prominent diffuse stool volume  raises the question of, but is not diagnostic for constipation. There is left colonic diverticulosis without diverticulitis.  Vascular/Lymphatic: There is abdominal aortic atherosclerosis without aneurysm. Infrarenal abdominal aorta measures up to 2.9 cm in diameter which is upper normal. No gastrohepatic or hepatoduodenal ligament lymphadenopathy. The portal vein is patent. No retroperitoneal lymphadenopathy. No pelvic sidewall lymphadenopathy.  Reproductive: Uterus is surgically absent.  No adnexal mass.  Other: No intraperitoneal free fluid.  Musculoskeletal: Numerous sclerotic lesions are seen involving bony anatomy of the pelvis. These appear relatively stable in the interval with no definite new lesion or progressive lesion evident.  IMPRESSION: 1. Interval decrease in mediastinal lymphadenopathy. Small pleural-based and paraspinal enhancing nodules are also decreased. 2. Multiple pulmonary nodules again identified, as before. The show slight interval decrease in size. No new or progressive pulmonary nodule is evident. 3. Stable 11 mm right adrenal nodule. 4. No new or progressive disease in the abdomen or pelvis to suggest metastatic progression. 5. Numerous sclerotic bony metastases with stable compression deformity of the T9 vertebral body, likely pathologic. Bony metastatic involvement does not appear progressed in the interval.   Electronically Signed   By: Misty Stanley M.D.   On: 12/11/2014 16:00   Ct Abdomen Pelvis W Contrast  12/11/2014   CLINICAL DATA:  Subsequent encounter for small-cell lung cancer.  EXAM: CT CHEST, ABDOMEN, AND PELVIS WITH CONTRAST  TECHNIQUE: Multidetector CT imaging of the chest, abdomen and pelvis was performed following the standard protocol during bolus administration of intravenous contrast.  CONTRAST:  120m OMNIPAQUE IOHEXOL 300 MG/ML  SOLN  COMPARISON:  10/13/2014.  FINDINGS: CT CHEST FINDINGS  Mediastinum/Nodes: Right-sided Port-A-Cath tip is positioned at the  junction of the SVC and RA. There is no axillary lymphadenopathy. 15 mm precarinal lymph node measured on the previous study is now 13 mm in short axis. 21 mm right hilar lymph node measured previously is canal 18 mm in the same dimension and measures 12 mm in short axis. 16 mm short axis subcarinal lymph node measured on the previous study is now 10 mm in short axis. No left hilar lymphadenopathy. Heart size is normal. No pericardial effusion.  The 11 x 18 mm paraspinal nodule at the T11 level now measures 16 x 11 mm. A more  ill-defined right paraspinal plaque-like lesion (image 42 series 2) measures 8 x 30 mm today compared to 12 x 31 mm previously.  Lungs/Pleura: Emphysematous changes noted in the lungs bilaterally. Tree in bud nodularity seen in the posterior right upper lobe previously is again noted but appears slightly decreased in the interval. The dominant 7 mm nodule seen in this region on the prior study is now 5 mm. 3 mm nodule along the anterior aspect of the right major fissure was 4-5 mm previously. Previously described subpleural nodule within the right lower lobe (image 31 series 4 today) is not substantially changed in the interval measuring 6 x 19 mm compared to 6 x 20 mm previously. This is in the region of the prominent bony spur and pathologic vertebral collapse. Other scattered smaller pleural nodules are seen along the right major fissure. No suspicious nodule or mass in the left lung. There is no pulmonary edema pleural effusion. No focal airspace consolidation.  Musculoskeletal: Multiple sclerotic lesions are seen throughout the thoracic spine. Compression deformity of the T9 vertebral body is stable and likely pathologic in nature.  CT ABDOMEN AND PELVIS FINDINGS  Hepatobiliary: Stable mild prominence of the intrahepatic biliary ducts. Tiny 6 mm hypo attenuating lesion in the posterior right liver (image 52 series 2) is stable in the interval. No other focal liver lesion is evident.  Gallbladder is surgically absent. No dilatation of the extrahepatic bile ducts.  Pancreas: No focal mass lesion. No dilatation of the main duct. No intraparenchymal cyst. No peripancreatic edema.  Spleen: No splenomegaly. No focal mass lesion.  Adrenals/Urinary Tract: Stable 11 mm right adrenal nodule. No discrete left adrenal nodule. Areas of cortical scarring are seen in the kidneys bilaterally. 2.6 cm cyst identified at the lower pole the right kidney. 3.7 cm cyst identified in the lower pole of the left kidney. Other smaller cysts are noted in the kidneys bilaterally.  Stomach/Bowel: Stomach is nondistended. No gastric wall thickening. No evidence of outlet obstruction. A very large duodenum diverticulum is evident. No small bowel wall thickening. No small bowel dilatation. Terminal ileum is normal. The appendix is normal. Prominent diffuse stool volume raises the question of, but is not diagnostic for constipation. There is left colonic diverticulosis without diverticulitis.  Vascular/Lymphatic: There is abdominal aortic atherosclerosis without aneurysm. Infrarenal abdominal aorta measures up to 2.9 cm in diameter which is upper normal. No gastrohepatic or hepatoduodenal ligament lymphadenopathy. The portal vein is patent. No retroperitoneal lymphadenopathy. No pelvic sidewall lymphadenopathy.  Reproductive: Uterus is surgically absent.  No adnexal mass.  Other: No intraperitoneal free fluid.  Musculoskeletal: Numerous sclerotic lesions are seen involving bony anatomy of the pelvis. These appear relatively stable in the interval with no definite new lesion or progressive lesion evident.  IMPRESSION: 1. Interval decrease in mediastinal lymphadenopathy. Small pleural-based and paraspinal enhancing nodules are also decreased. 2. Multiple pulmonary nodules again identified, as before. The show slight interval decrease in size. No new or progressive pulmonary nodule is evident. 3. Stable 11 mm right adrenal nodule.  4. No new or progressive disease in the abdomen or pelvis to suggest metastatic progression. 5. Numerous sclerotic bony metastases with stable compression deformity of the T9 vertebral body, likely pathologic. Bony metastatic involvement does not appear progressed in the interval.   Electronically Signed   By: Misty Stanley M.D.   On: 12/11/2014 16:00   Dg Abd Acute W/chest  12/23/2014   CLINICAL DATA:  Small cell carcinoma of the lung with osseous metastases, received  chemotherapy 3 days ago, diarrhea since, unable to eat or drink, RIGHT lower abdominal pain  EXAM: DG ABDOMEN ACUTE W/ 1V CHEST  COMPARISON:  Chest radiograph 02/24/2014; CT chest abdomen pelvis 12/11/2014  FINDINGS: RIGHT jugular Port-A-Cath with tip projecting over SVC.  Normal heart size, mediastinal contours and pulmonary vascularity.  Known mediastinal and RIGHT hilar adenopathy not well visualized radiographically.  Atherosclerotic calcifications aorta.  Lungs emphysematous with mild central peribronchial thickening.  No infiltrate, pleural effusion or pneumothorax.  Normal bowel gas pattern.  No bowel dilatation or bowel wall thickening, or free intraperitoneal air.  Prior L4-S1 fusion.  Degenerative disc disease changes thoracic spine.  No definite urinary tract calcification.  Diffuse osseous metastases.  IMPRESSION: No acute abnormalities.  Emphysematous changes.  Osseous metastatic disease.   Electronically Signed   By: Lavonia Dana M.D.   On: 12/23/2014 16:21     CBC  Recent Labs Lab 12/20/14 0814 12/23/14 1540 12/23/14 1944 12/24/14 0423  WBC 2.0* 7.2 5.4 4.4  HGB 9.7* 10.0* 9.1* 8.9*  HCT 27.8* 28.1* 25.6* 24.8*  PLT 101* 86* 64* 55*  MCV 85.8 84.4 85.0 84.9  MCH 29.9 30.0 30.2 30.5  MCHC 34.8 35.6 35.5 35.9  RDW 16.6* 15.6* 15.7* 15.7*  LYMPHSABS 0.7* 0.6*  --   --   MONOABS 0.2 0.4  --   --   EOSABS 0.1 0.0  --   --   BASOSABS 0.0 0.0  --   --     Chemistries   Recent Labs Lab 12/20/14 0814 12/23/14 1540  12/23/14 1944 12/24/14 0423  NA 128* 122*  --  123*  K 4.4 4.0  --  3.4*  CL  --  84*  --  92*  CO2 28 25  --  23  GLUCOSE 96 88  --  87  BUN 11.4 21*  --  18  CREATININE 0.8 1.11* 0.96 0.87  CALCIUM 7.3* 5.9*  --  6.3*  MG  --   --  1.0*  --   AST 11 16  --  13*  ALT 8 8*  --  8*  ALKPHOS 70 71  --  61  BILITOT 0.87 1.6*  --  1.1   ------------------------------------------------------------------------------------------------------------------ estimated creatinine clearance is 46.8 mL/min (by C-G formula based on Cr of 0.87). ------------------------------------------------------------------------------------------------------------------ No results for input(s): HGBA1C in the last 72 hours. ------------------------------------------------------------------------------------------------------------------ No results for input(s): CHOL, HDL, LDLCALC, TRIG, CHOLHDL, LDLDIRECT in the last 72 hours. ------------------------------------------------------------------------------------------------------------------ No results for input(s): TSH, T4TOTAL, T3FREE, THYROIDAB in the last 72 hours.  Invalid input(s): FREET3 ------------------------------------------------------------------------------------------------------------------ No results for input(s): VITAMINB12, FOLATE, FERRITIN, TIBC, IRON, RETICCTPCT in the last 72 hours.  Coagulation profile No results for input(s): INR, PROTIME in the last 168 hours.  No results for input(s): DDIMER in the last 72 hours.  Cardiac Enzymes No results for input(s): CKMB, TROPONINI, MYOGLOBIN in the last 168 hours.  Invalid input(s): CK ------------------------------------------------------------------------------------------------------------------ Invalid input(s): POCBNP     Time Spent in minutes   30 minutes   Susana Duell M.D on 12/24/2014 at 1:41 PM  Between 7am to 7pm - Pager - [phone]  After 7pm go to [email or web address] -  password [facility]  [facility]   Office  [phone]

## 2014-12-25 LAB — BASIC METABOLIC PANEL
Anion gap: 6 (ref 5–15)
BUN: 10 mg/dL (ref 6–20)
CO2: 26 mmol/L (ref 22–32)
Calcium: 7.5 mg/dL — ABNORMAL LOW (ref 8.9–10.3)
Chloride: 94 mmol/L — ABNORMAL LOW (ref 101–111)
Creatinine, Ser: 0.7 mg/dL (ref 0.44–1.00)
GFR calc non Af Amer: >60 mL/min (ref >60)
Glucose, Bld: 108 mg/dL — ABNORMAL HIGH (ref 65–99)
Potassium: 3.5 mmol/L (ref 3.5–5.1)
Sodium: 126 mmol/L — ABNORMAL LOW (ref 135–145)

## 2014-12-25 LAB — CBC
HCT: 22 % — ABNORMAL LOW (ref 36.0–46.0)
Hemoglobin: 7.8 g/dL — ABNORMAL LOW (ref 12.0–15.0)
MCH: 30.1 pg (ref 26.0–34.0)
MCHC: 35.5 g/dL (ref 30.0–36.0)
MCV: 84.9 fL (ref 78.0–100.0)
Platelets: 40 10*3/uL — ABNORMAL LOW (ref 150–400)
RBC: 2.59 MIL/uL — AB (ref 3.87–5.11)
RDW: 15.6 % — AB (ref 11.5–15.5)
WBC: 1.7 10*3/uL — AB (ref 4.0–10.5)

## 2014-12-25 LAB — HEMOGLOBIN A1C
HEMOGLOBIN A1C: 5.5 % (ref 4.8–5.6)
MEAN PLASMA GLUCOSE: 111 mg/dL

## 2014-12-25 MED ORDER — SODIUM CHLORIDE 1 G PO TABS
2.0000 g | ORAL_TABLET | Freq: Once | ORAL | Status: AC
  Administered 2014-12-25: 2 g via ORAL
  Filled 2014-12-25: qty 2

## 2014-12-25 NOTE — Progress Notes (Signed)
Initial Nutrition Assessment  DOCUMENTATION CODES:   Severe malnutrition in context of chronic illness  INTERVENTION:  - Continue Ensure Enlive BID, each supplement provides 350 kcal and 20 grams of protein - Encourage PO intakes as able - RD will continue to monitor for needs  NUTRITION DIAGNOSIS:   Increased nutrient needs related to catabolic illness, cancer and cancer related treatments as evidenced by estimated needs.  GOAL:   Patient will meet greater than or equal to 90% of their needs  MONITOR:   PO intake, Supplement acceptance, Weight trends, Labs, I & O's  REASON FOR ASSESSMENT:   Malnutrition Screening Tool  ASSESSMENT:   74 y.o. female, with history of stage four small cell lung cancer with metastasis to the bone, and chemotherapy. Patient presents with complaints of nausea, poor oral intake, weakness, and diarrhea for the last 3 days, patient reports she received her chemotherapy on Wednesday, started to have above complaints since, patient reports this is typical for her chemotherapy, but reported area of this time was more persistent than before, in ED patient was a febrile, workup was significant for hyponatremia of sodium of 122, Hypocalcemia with calcium of 5.9, white blood cell of 7.2, mild thrombocytopenia platelet count of 86, patient was orthostatic in ED, patient reports she has been taking her Lomotil and Imodium as instructed, without much improvement of diarrhea, abdominal x-ray with no acute finding, hospitalist called to admit.   Pt seen for MST. BMI indicates normal weight status. Pt on FLD but only had cup of orange juice for breakfast so far this AM. She reports nausea is slightly improved after medications administration.  She states that nausea and abdominal discomfort have been ongoing for 1 week which has decreased her appetite but even before this occurrence her appetite was not very good. She states bland taste to foods (taste alteration due to  chemo). At home she was drinking Ensure and is thankful for order for it during admission. She also states that MD has ordered Gatorade and encouraged her to drink this for electrolyte repletion.  Per weight hx review, pt has lost 5 lbs (4% body weight) in the past 11 days which is significant for time frame.  Not meeting needs. Moderate to severe muscle and moderate to severe fat wasting noted. Medications reviewed. Labs reviewed; Na: 126, Cl: 94 mmol/L, Ca: 7.5 mg/dL.   Diet Order:  Diet full liquid Room service appropriate?: No; Fluid consistency:: Thin  Skin:  Reviewed, no issues  Last BM:  8/7  Height:   Ht Readings from Last 1 Encounters:  12/23/14 '5\' 5"'$  (1.651 m)    Weight:   Wt Readings from Last 1 Encounters:  12/23/14 113 lb 8.6 oz (51.5 kg)    Ideal Body Weight:  56.82 kg (kg)  BMI:  Body mass index is 18.89 kg/(m^2).  Estimated Nutritional Needs:   Kcal:  1545-1745  Protein:  60-75 grams  Fluid:  2.2 L/day  EDUCATION NEEDS:   No education needs identified at this time     Jarome Matin, RD, LDN Inpatient Clinical Dietitian Pager # [phone] After hours/weekend pager # [phone]

## 2014-12-25 NOTE — Care Management Important Message (Signed)
Important Message  Patient Details  Name: [name] MRN: [id number] Date of Birth: [birth year]   Medicare Important Message Given:  [facility] notification given    Camillo Flaming 12/25/2014, 12:58 [place] Message  Patient Details  Name: [name] MRN: [id number] Date of Birth: [birth year]   Medicare Important Message Given:  Yes-second notification given    Camillo Flaming 12/25/2014, 12:58 PM

## 2014-12-25 NOTE — Care Management Note (Signed)
Case Management Note  Patient Details  Name: [name] MRN: [id number] Date of Birth: July 07, 1940  Subjective/Objective: 74 y/o f admitted w/hyponatremia, hypocalcemia. DNR.AX:KPVV Ca. From home.                   Action/Plan:d/c plan home.   Expected Discharge Date:                 Expected Discharge Plan:  Home/Self Care  In-House Referral:     Discharge planning Services  CM Consult  Post Acute Care Choice:    Choice offered to:     DME Arranged:    DME Agency:     HH Arranged:    HH Agency:     Status of Service:  In process, will continue to follow  Medicare Important Message Given:  Yes-second notification given Date Medicare IM Given:    Medicare IM give by:    Date Additional Medicare IM Given:    Additional Medicare Important Message give by:     If discussed at [place] of Stay Meetings, dates discussed:    Additional Comments:  Dessa Phi, RN 12/25/2014, 2:19 PM

## 2014-12-25 NOTE — Progress Notes (Signed)
Patient Demographics  [name], is a 74 y.o. female, DOB - [birth year], DDU:[id number]  Admit date - 12/23/2014   Admitting Physician Albertine Patricia, MD  Outpatient Primary MD for the patient is [name], MD  LOS - 2   Chief Complaint  Patient presents with  . Diarrhea       Admission HPI/Brief narrative: 74 y.o. 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 Earlie Server, on chemotherapy most recently last Wednesday, follow-up diarrhea, dehydration, after her treatment.  Subjective:   [name] today has, No headache, No chest pain,  - No Nausereports generalized weakness, no further diarrhea since yesterday evening.  Assessment & Plan    Principal Problem:   Hyponatremia Active Problems:   GERD   Small cell carcinoma of lung   Nausea with vomiting   Dehydration   Diarrhea   Thrombocytopenia   Hypocalcemia  Dehydration  - Secondary to diarrhea, poor oral intake from chemotherapy , [place] Mikes of diet - Continue with IV fluids .  Diarrhea, nausea - This is related to her chemotherapy, continue with Imodium and Lomotil as needed - C. difficile is negative - Continue with IV fluids - Diarrhea significantly improved  Hyponatremia  - Patient with known chronic hyponatremia, most likely related to her lung cancer ,  worsened due to volume depletion and hypovolemia, we'll continue with IV normal saline, will encourage to drink Gatorade , recheck an a.m. Marland Kitchen  Hypokalemia  - Repleted   Hypocalcemia -continue with oral supplement, improving   GERD - Continue with PPI  Small cell carcinoma of the lung - Patient is followed by Dr. Earlie Server as an outpatient, Discussed with Dr. Earlie Server over the phone .  Pancytopenia - This is most likely related to chemotherapy nadir , will recheck labs in a.m., and transfuse if indicated  .  Urinary retention  - patient had an episode of urinary retention overnight , discontinued Foley catheter this a.m. , will monitor closely .  Tremors  - Continue with carbidopa-levodopa   Code Status: DNR  Family Communication: none at bedside  Disposition Plan: home in 24-48 hours after appropriately hydrated    Procedures  None   Consults   None    Medications  Scheduled Meds: . atorvastatin  80 mg Oral QPC supper  . buPROPion  150 mg Oral q morning - 10a  . calcium carbonate  500 mg of elemental calcium Oral TID  . carbidopa-levodopa  1 tablet Oral TID WC  . feeding supplement (ENSURE ENLIVE)  237 mL Oral BID BM  . glycopyrrolate  2 mg Oral BID  . pantoprazole  40 mg Oral Daily  . potassium chloride SA  20 mEq Oral BID  . sodium chloride  3 mL Intravenous Q12H  . sodium chloride  2 g Oral Once   Continuous Infusions: . sodium chloride 100 mL/hr at 12/25/14 1411   PRN Meds:.acetaminophen **OR** acetaminophen, albuterol, bisacodyl, diphenoxylate-atropine, HYDROcodone-acetaminophen, loperamide, ondansetron, ondansetron **OR** ondansetron (ZOFRAN) IV, oxyCODONE-acetaminophen, polyethylene glycol, prochlorperazine, sodium chloride  DVT Prophylaxis  - SCDs  Lab Results  Component Value Date   PLT 40* 12/25/2014    Antibiotics   Anti-infectives    None  Objective:   Filed Vitals:   12/24/14 2133 12/25/14 0414 12/25/14 1415 12/25/14 1519  BP: 120/57 106/64 129/72 128/66  Pulse: 88 82 98 85  Temp: 98.4 F (36.9 C) 98.5 F (36.9 C) 98.3 F (36.8 C) 98.7 F (37.1 C)  TempSrc: Oral Oral Oral Oral  Resp: '16 16  18  '$ Height:      Weight:      SpO2: 100% 99% 100% 100%    Wt Readings from Last 3 Encounters:  12/23/14 51.5 kg (113 lb 8.6 oz)  12/13/14 53.933 kg (118 lb 14.4 oz)  11/15/14 53.57 kg (118 lb 1.6 oz)     Intake/Output Summary (Last 24 hours) at 12/25/14 1655 Last data filed at 12/25/14 1500  Gross per 24 hour  Intake   3800  ml  Output   2300 ml  Net   1500 ml     Physical Exam  Awake Alert, Oriented X 3, [place].AT,PERRAL Supple neck ,dry oral mucosa  Symmetrical Chest wall movement, Good air movement bilaterally,  RRR,No Gallops,Rubs or new Murmurs, No Parasternal Heave +ve B.Sounds, Abd Soft, No tenderness, No organomegaly appriciated, No rebound - guarding or rigidity. No Cyanosis, Clubbing or edema, No new Rash or bruise  , + tremors    Data Review   Micro Results Recent Results (from the past 240 hour(s))  C difficile quick scan w PCR reflex     Status: None   Collection Time: 12/24/14  9:20 AM  Result Value Ref Range Status   C Diff antigen NEGATIVE NEGATIVE Final   C Diff toxin NEGATIVE NEGATIVE Final   C Diff interpretation Negative for toxigenic C. difficile  Final    Radiology Reports Ct Chest W Contrast  12/11/2014   CLINICAL DATA:  Subsequent encounter for small-cell lung cancer.  EXAM: CT CHEST, ABDOMEN, AND PELVIS WITH CONTRAST  TECHNIQUE: Multidetector CT imaging of the chest, abdomen and pelvis was performed following the standard protocol during bolus administration of intravenous contrast.  CONTRAST:  16m OMNIPAQUE IOHEXOL 300 MG/ML  SOLN  COMPARISON:  10/13/2014.  FINDINGS: CT CHEST FINDINGS  Mediastinum/Nodes: Right-sided Port-A-Cath tip is positioned at the junction of the SVC and RA. There is no axillary lymphadenopathy. 15 mm precarinal lymph node measured on the previous study is now 13 mm in short axis. 21 mm right hilar lymph node measured previously is canal 18 mm in the same dimension and measures 12 mm in short axis. 16 mm short axis subcarinal lymph node measured on the previous study is now 10 mm in short axis. No left hilar lymphadenopathy. Heart size is normal. No pericardial effusion.  The 11 x 18 mm paraspinal nodule at the T11 level now measures 16 x 11 mm. A more ill-defined right paraspinal plaque-like lesion (image 42 series 2) measures 8 x 30 mm today compared to 12 x  31 mm previously.  Lungs/Pleura: Emphysematous changes noted in the lungs bilaterally. Tree in bud nodularity seen in the posterior right upper lobe previously is again noted but appears slightly decreased in the interval. The dominant 7 mm nodule seen in this region on the prior study is now 5 mm. 3 mm nodule along the anterior aspect of the right major fissure was 4-5 mm previously. Previously described subpleural nodule within the right lower lobe (image 31 series 4 today) is not substantially changed in the interval measuring 6 x 19 mm compared to 6 x 20 mm previously. This is in the region of the prominent bony spur  and pathologic vertebral collapse. Other scattered smaller pleural nodules are seen along the right major fissure. No suspicious nodule or mass in the left lung. There is no pulmonary edema pleural effusion. No focal airspace consolidation.  Musculoskeletal: Multiple sclerotic lesions are seen throughout the thoracic spine. Compression deformity of the T9 vertebral body is stable and likely pathologic in nature.  CT ABDOMEN AND PELVIS FINDINGS  Hepatobiliary: Stable mild prominence of the intrahepatic biliary ducts. Tiny 6 mm hypo attenuating lesion in the posterior right liver (image 52 series 2) is stable in the interval. No other focal liver lesion is evident. Gallbladder is surgically absent. No dilatation of the extrahepatic bile ducts.  Pancreas: No focal mass lesion. No dilatation of the main duct. No intraparenchymal cyst. No peripancreatic edema.  Spleen: No splenomegaly. No focal mass lesion.  Adrenals/Urinary Tract: Stable 11 mm right adrenal nodule. No discrete left adrenal nodule. Areas of cortical scarring are seen in the kidneys bilaterally. 2.6 cm cyst identified at the lower pole the right kidney. 3.7 cm cyst identified in the lower pole of the left kidney. Other smaller cysts are noted in the kidneys bilaterally.  Stomach/Bowel: Stomach is nondistended. No gastric wall thickening.  No evidence of outlet obstruction. A very large duodenum diverticulum is evident. No small bowel wall thickening. No small bowel dilatation. Terminal ileum is normal. The appendix is normal. Prominent diffuse stool volume raises the question of, but is not diagnostic for constipation. There is left colonic diverticulosis without diverticulitis.  Vascular/Lymphatic: There is abdominal aortic atherosclerosis without aneurysm. Infrarenal abdominal aorta measures up to 2.9 cm in diameter which is upper normal. No gastrohepatic or hepatoduodenal ligament lymphadenopathy. The portal vein is patent. No retroperitoneal lymphadenopathy. No pelvic sidewall lymphadenopathy.  Reproductive: Uterus is surgically absent.  No adnexal mass.  Other: No intraperitoneal free fluid.  Musculoskeletal: Numerous sclerotic lesions are seen involving bony anatomy of the pelvis. These appear relatively stable in the interval with no definite new lesion or progressive lesion evident.  IMPRESSION: 1. Interval decrease in mediastinal lymphadenopathy. Small pleural-based and paraspinal enhancing nodules are also decreased. 2. Multiple pulmonary nodules again identified, as before. The show slight interval decrease in size. No new or progressive pulmonary nodule is evident. 3. Stable 11 mm right adrenal nodule. 4. No new or progressive disease in the abdomen or pelvis to suggest metastatic progression. 5. Numerous sclerotic bony metastases with stable compression deformity of the T9 vertebral body, likely pathologic. Bony metastatic involvement does not appear progressed in the interval.   Electronically Signed   By: Misty Stanley M.D.   On: 12/11/2014 16:00   Ct Abdomen Pelvis W Contrast  12/11/2014   CLINICAL DATA:  Subsequent encounter for small-cell lung cancer.  EXAM: CT CHEST, ABDOMEN, AND PELVIS WITH CONTRAST  TECHNIQUE: Multidetector CT imaging of the chest, abdomen and pelvis was performed following the standard protocol during bolus  administration of intravenous contrast.  CONTRAST:  162m OMNIPAQUE IOHEXOL 300 MG/ML  SOLN  COMPARISON:  10/13/2014.  FINDINGS: CT CHEST FINDINGS  Mediastinum/Nodes: Right-sided Port-A-Cath tip is positioned at the junction of the SVC and RA. There is no axillary lymphadenopathy. 15 mm precarinal lymph node measured on the previous study is now 13 mm in short axis. 21 mm right hilar lymph node measured previously is canal 18 mm in the same dimension and measures 12 mm in short axis. 16 mm short axis subcarinal lymph node measured on the previous study is now 10 mm in short axis. No left  hilar lymphadenopathy. Heart size is normal. No pericardial effusion.  The 11 x 18 mm paraspinal nodule at the T11 level now measures 16 x 11 mm. A more ill-defined right paraspinal plaque-like lesion (image 42 series 2) measures 8 x 30 mm today compared to 12 x 31 mm previously.  Lungs/Pleura: Emphysematous changes noted in the lungs bilaterally. Tree in bud nodularity seen in the posterior right upper lobe previously is again noted but appears slightly decreased in the interval. The dominant 7 mm nodule seen in this region on the prior study is now 5 mm. 3 mm nodule along the anterior aspect of the right major fissure was 4-5 mm previously. Previously described subpleural nodule within the right lower lobe (image 31 series 4 today) is not substantially changed in the interval measuring 6 x 19 mm compared to 6 x 20 mm previously. This is in the region of the prominent bony spur and pathologic vertebral collapse. Other scattered smaller pleural nodules are seen along the right major fissure. No suspicious nodule or mass in the left lung. There is no pulmonary edema pleural effusion. No focal airspace consolidation.  Musculoskeletal: Multiple sclerotic lesions are seen throughout the thoracic spine. Compression deformity of the T9 vertebral body is stable and likely pathologic in nature.  CT ABDOMEN AND PELVIS FINDINGS   Hepatobiliary: Stable mild prominence of the intrahepatic biliary ducts. Tiny 6 mm hypo attenuating lesion in the posterior right liver (image 52 series 2) is stable in the interval. No other focal liver lesion is evident. Gallbladder is surgically absent. No dilatation of the extrahepatic bile ducts.  Pancreas: No focal mass lesion. No dilatation of the main duct. No intraparenchymal cyst. No peripancreatic edema.  Spleen: No splenomegaly. No focal mass lesion.  Adrenals/Urinary Tract: Stable 11 mm right adrenal nodule. No discrete left adrenal nodule. Areas of cortical scarring are seen in the kidneys bilaterally. 2.6 cm cyst identified at the lower pole the right kidney. 3.7 cm cyst identified in the lower pole of the left kidney. Other smaller cysts are noted in the kidneys bilaterally.  Stomach/Bowel: Stomach is nondistended. No gastric wall thickening. No evidence of outlet obstruction. A very large duodenum diverticulum is evident. No small bowel wall thickening. No small bowel dilatation. Terminal ileum is normal. The appendix is normal. Prominent diffuse stool volume raises the question of, but is not diagnostic for constipation. There is left colonic diverticulosis without diverticulitis.  Vascular/Lymphatic: There is abdominal aortic atherosclerosis without aneurysm. Infrarenal abdominal aorta measures up to 2.9 cm in diameter which is upper normal. No gastrohepatic or hepatoduodenal ligament lymphadenopathy. The portal vein is patent. No retroperitoneal lymphadenopathy. No pelvic sidewall lymphadenopathy.  Reproductive: Uterus is surgically absent.  No adnexal mass.  Other: No intraperitoneal free fluid.  Musculoskeletal: Numerous sclerotic lesions are seen involving bony anatomy of the pelvis. These appear relatively stable in the interval with no definite new lesion or progressive lesion evident.  IMPRESSION: 1. Interval decrease in mediastinal lymphadenopathy. Small pleural-based and paraspinal  enhancing nodules are also decreased. 2. Multiple pulmonary nodules again identified, as before. The show slight interval decrease in size. No new or progressive pulmonary nodule is evident. 3. Stable 11 mm right adrenal nodule. 4. No new or progressive disease in the abdomen or pelvis to suggest metastatic progression. 5. Numerous sclerotic bony metastases with stable compression deformity of the T9 vertebral body, likely pathologic. Bony metastatic involvement does not appear progressed in the interval.   Electronically Signed   By: Misty Stanley  M.D.   On: 12/11/2014 16:00   Dg Abd Acute W/chest  12/23/2014   CLINICAL DATA:  Small cell carcinoma of the lung with osseous metastases, received chemotherapy 3 days ago, diarrhea since, unable to eat or drink, RIGHT lower abdominal pain  EXAM: DG ABDOMEN ACUTE W/ 1V CHEST  COMPARISON:  Chest radiograph 02/24/2014; CT chest abdomen pelvis 12/11/2014  FINDINGS: RIGHT jugular Port-A-Cath with tip projecting over SVC.  Normal heart size, mediastinal contours and pulmonary vascularity.  Known mediastinal and RIGHT hilar adenopathy not well visualized radiographically.  Atherosclerotic calcifications aorta.  Lungs emphysematous with mild central peribronchial thickening.  No infiltrate, pleural effusion or pneumothorax.  Normal bowel gas pattern.  No bowel dilatation or bowel wall thickening, or free intraperitoneal air.  Prior L4-S1 fusion.  Degenerative disc disease changes thoracic spine.  No definite urinary tract calcification.  Diffuse osseous metastases.  IMPRESSION: No acute abnormalities.  Emphysematous changes.  Osseous metastatic disease.   Electronically Signed   By: Lavonia Dana M.D.   On: 12/23/2014 16:21     CBC  Recent Labs Lab 12/20/14 0814 12/23/14 1540 12/23/14 1944 12/24/14 0423 12/25/14 0411  WBC 2.0* 7.2 5.4 4.4 1.7*  HGB 9.7* 10.0* 9.1* 8.9* 7.8*  HCT 27.8* 28.1* 25.6* 24.8* 22.0*  PLT 101* 86* 64* 55* 40*  MCV 85.8 84.4 85.0 84.9 84.9   MCH 29.9 30.0 30.2 30.5 30.1  MCHC 34.8 35.6 35.5 35.9 35.5  RDW 16.6* 15.6* 15.7* 15.7* 15.6*  LYMPHSABS 0.7* 0.6*  --   --   --   MONOABS 0.2 0.4  --   --   --   EOSABS 0.1 0.0  --   --   --   BASOSABS 0.0 0.0  --   --   --     Chemistries   Recent Labs Lab 12/20/14 0814 12/23/14 1540 12/23/14 1944 12/24/14 0423 12/25/14 0411  NA 128* 122*  --  123* 126*  K 4.4 4.0  --  3.4* 3.5  CL  --  84*  --  92* 94*  CO2 28 25  --  23 26  GLUCOSE 96 88  --  87 108*  BUN 11.4 21*  --  18 10  CREATININE 0.8 1.11* 0.96 0.87 0.70  CALCIUM 7.3* 5.9*  --  6.3* 7.5*  MG  --   --  1.0*  --   --   AST 11 16  --  13*  --   ALT 8 8*  --  8*  --   ALKPHOS 70 71  --  61  --   BILITOT 0.87 1.6*  --  1.1  --    ------------------------------------------------------------------------------------------------------------------ estimated creatinine clearance is 50.9 mL/min (by C-G formula based on Cr of 0.7). ------------------------------------------------------------------------------------------------------------------  Recent Labs  12/23/14 1944  HGBA1C 5.5   ------------------------------------------------------------------------------------------------------------------ No results for input(s): CHOL, HDL, LDLCALC, TRIG, CHOLHDL, LDLDIRECT in the last 72 hours. ------------------------------------------------------------------------------------------------------------------ No results for input(s): TSH, T4TOTAL, T3FREE, THYROIDAB in the last 72 hours.  Invalid input(s): FREET3 ------------------------------------------------------------------------------------------------------------------ No results for input(s): VITAMINB12, FOLATE, FERRITIN, TIBC, IRON, RETICCTPCT in the last 72 hours.  Coagulation profile No results for input(s): INR, PROTIME in the last 168 hours.  No results for input(s): DDIMER in the last 72 hours.  Cardiac Enzymes No results for input(s): CKMB, TROPONINI,  MYOGLOBIN in the last 168 hours.  Invalid input(s): CK ------------------------------------------------------------------------------------------------------------------ Invalid input(s): POCBNP     Time Spent in minutes   30 minutes   Chelsye Suhre  M.D on 12/25/2014 at 4:55 PM  Between 7am to 7pm - Pager - [phone]  After 7pm go to [email or web address] - password [facility]  [facility]   Office  [phone]

## 2014-12-25 NOTE — Progress Notes (Signed)
Physical Therapy Treatment Patient Details Name: MAYGAN KOELLER MRN: [id number] DOB: [birth year] Today's Date: 12/26/2014    History of Present Illness 74 y/o female admitted with hyponatremia.  PMH includes small cell lung CA with mets to bone and Parkinson's    PT Comments    Pt progressing well with mobility and anticipates d/c home with family support tomorrow.  Follow Up Recommendations  No PT follow up     Equipment Recommendations  None recommended by PT    Recommendations for Other Services       Precautions / Restrictions Precautions Precautions: Fall    Mobility  Bed Mobility Overal bed mobility: Modified Independent                Transfers Overall transfer level: Needs assistance Equipment used: Rolling walker (2 wheeled) Transfers: Sit to/from Stand Sit to Stand: Supervision         General transfer comment: verbal cues for hand placement  Ambulation/Gait Ambulation/Gait assistance: Supervision Ambulation Distance (Feet): 200 Feet Assistive device: Rolling walker (2 wheeled) Gait Pattern/deviations: Step-through pattern;Decreased stride length     General Gait Details: slow but steady gait with RW, pt wished to keep RW today, did not yet feel ready for [facility]   Stairs            Wheelchair Mobility    Modified Rankin (Stroke Patients Only)       Balance                                    Cognition Arousal/Alertness: Awake/alert Behavior During Therapy: WFL for tasks assessed/performed Overall Cognitive Status: Within Functional Limits for tasks assessed                      Exercises      General Comments        Pertinent Vitals/Pain Pain Assessment: 0-10 Pain Score: 3  Pain Location: abdomen Pain Descriptors / Indicators: Aching Pain Intervention(s): Limited activity within patient's tolerance;Monitored during session;Repositioned    Home Living                      Prior Function             PT Goals (current goals can now be found in the care plan section) Progress towards PT goals: Progressing toward goals    Frequency  Min 3X/week    PT Plan Current plan remains appropriate    Co-evaluation             End of Session   Activity Tolerance: Patient tolerated treatment well Patient left: in chair;with call bell/phone within reach;with family/visitor present     Time: 9628-3662 PT Time Calculation (min) (ACUTE ONLY): 14 min  Charges:  $Gait Training: 8-22 mins                    G Codes:      Kambry Takacs,KATHrine E December 26, 2014, 3:56 PM Carmelia Bake, PT, DPT 12/26/2014 Pager: [phone]

## 2014-12-26 ENCOUNTER — Other Ambulatory Visit: Payer: Self-pay | Admitting: *Deleted

## 2014-12-26 ENCOUNTER — Telehealth: Payer: Self-pay | Admitting: *Deleted

## 2014-12-26 ENCOUNTER — Inpatient Hospital Stay (HOSPITAL_COMMUNITY): Payer: Medicare Other

## 2014-12-26 DIAGNOSIS — E46 Unspecified protein-calorie malnutrition: Secondary | ICD-10-CM

## 2014-12-26 DIAGNOSIS — D6481 Anemia due to antineoplastic chemotherapy: Secondary | ICD-10-CM

## 2014-12-26 DIAGNOSIS — E871 Hypo-osmolality and hyponatremia: Secondary | ICD-10-CM

## 2014-12-26 DIAGNOSIS — C7951 Secondary malignant neoplasm of bone: Secondary | ICD-10-CM

## 2014-12-26 DIAGNOSIS — D696 Thrombocytopenia, unspecified: Secondary | ICD-10-CM

## 2014-12-26 DIAGNOSIS — R110 Nausea: Secondary | ICD-10-CM

## 2014-12-26 DIAGNOSIS — C3490 Malignant neoplasm of unspecified part of unspecified bronchus or lung: Secondary | ICD-10-CM

## 2014-12-26 DIAGNOSIS — D72819 Decreased white blood cell count, unspecified: Secondary | ICD-10-CM

## 2014-12-26 DIAGNOSIS — R197 Diarrhea, unspecified: Secondary | ICD-10-CM

## 2014-12-26 LAB — CBC
HEMATOCRIT: 21 % — AB (ref 36.0–46.0)
Hemoglobin: 7.5 g/dL — ABNORMAL LOW (ref 12.0–15.0)
MCH: 30.2 pg (ref 26.0–34.0)
MCHC: 35.7 g/dL (ref 30.0–36.0)
MCV: 84.7 fL (ref 78.0–100.0)
Platelets: 61 10*3/uL — ABNORMAL LOW (ref 150–400)
RBC: 2.48 MIL/uL — ABNORMAL LOW (ref 3.87–5.11)
RDW: 15.5 % (ref 11.5–15.5)
WBC: 1.5 10*3/uL — ABNORMAL LOW (ref 4.0–10.5)

## 2014-12-26 LAB — BASIC METABOLIC PANEL
Anion gap: 6 (ref 5–15)
BUN: 6 mg/dL (ref 6–20)
CO2: 28 mmol/L (ref 22–32)
Calcium: 8 mg/dL — ABNORMAL LOW (ref 8.9–10.3)
Chloride: 95 mmol/L — ABNORMAL LOW (ref 101–111)
Creatinine, Ser: 0.69 mg/dL (ref 0.44–1.00)
GFR calc Af Amer: >60 mL/min (ref >60)
GLUCOSE: 95 mg/dL (ref 65–99)
POTASSIUM: 3.7 mmol/L (ref 3.5–5.1)
SODIUM: 129 mmol/L — AB (ref 135–145)

## 2014-12-26 LAB — PREPARE RBC (CROSSMATCH)

## 2014-12-26 LAB — PHOSPHORUS: Phosphorus: 4 mg/dL (ref 2.5–4.6)

## 2014-12-26 LAB — MAGNESIUM: MAGNESIUM: 0.7 mg/dL — AB (ref 1.7–2.4)

## 2014-12-26 MED ORDER — SODIUM CHLORIDE 0.9 % IV SOLN
Freq: Once | INTRAVENOUS | Status: AC
  Administered 2014-12-26: 12:00:00 via INTRAVENOUS

## 2014-12-26 MED ORDER — MAGNESIUM SULFATE 50 % IJ SOLN: 3.0000 g | Freq: Once | INTRAMUSCULAR | Status: DC

## 2014-12-26 MED ORDER — DIPHENOXYLATE-ATROPINE 2.5-0.025 MG PO TABS: 2.0000 | ORAL_TABLET | Freq: Four times a day (QID) | ORAL | Status: AC | PRN

## 2014-12-26 MED ORDER — DIPHENOXYLATE-ATROPINE 2.5-0.025 MG PO TABS: 2.0000 | ORAL_TABLET | Freq: Four times a day (QID) | ORAL | Status: DC | PRN

## 2014-12-26 MED ORDER — HEPARIN SOD (PORK) LOCK FLUSH 100 UNIT/ML IV SOLN
500.0000 [IU] | INTRAVENOUS | Status: DC
  Filled 2014-12-26: qty 5

## 2014-12-26 MED ORDER — POTASSIUM CHLORIDE CRYS ER 20 MEQ PO TBCR
40.0000 meq | EXTENDED_RELEASE_TABLET | Freq: Once | ORAL | Status: AC
  Administered 2014-12-26: 40 meq via ORAL
  Filled 2014-12-26: qty 2

## 2014-12-26 MED ORDER — MAGNESIUM SULFATE IN D5W 10-5 MG/ML-% IV SOLN
1.0000 g | Freq: Once | INTRAVENOUS | Status: AC
  Administered 2014-12-26: 1 g via INTRAVENOUS
  Filled 2014-12-26: qty 100

## 2014-12-26 MED ORDER — MAGNESIUM SULFATE 2 GM/50ML IV SOLN
2.0000 g | Freq: Once | INTRAVENOUS | Status: AC
  Administered 2014-12-26: 2 g via INTRAVENOUS
  Filled 2014-12-26: qty 50

## 2014-12-26 MED ORDER — HEPARIN SOD (PORK) LOCK FLUSH 100 UNIT/ML IV SOLN
500.0000 [IU] | INTRAVENOUS | Status: DC | PRN
  Administered 2014-12-26: 500 [IU]
  Filled 2014-12-26 (×2): qty 5

## 2014-12-26 MED ORDER — MAGNESIUM OXIDE 400 (241.3 MG) MG PO TABS: 400.0000 mg | ORAL_TABLET | Freq: Every day | ORAL | Status: AC

## 2014-12-26 MED ORDER — MAGNESIUM OXIDE 400 (241.3 MG) MG PO TABS: 400.0000 mg | ORAL_TABLET | Freq: Every day | ORAL | Status: DC

## 2014-12-26 NOTE — Discharge Instructions (Signed)
Follow with Primary MD [name], MD in 5 days  - To have voiding trials and PCP office and DC Foley catheter.  Get CBC, CMP, 2 view Chest X ray checked  by Primary MD next visit.    Activity: As tolerated with Full fall precautions use walker/cane & assistance as needed   Disposition Home   Diet: Heart Healthy ** , with feeding assistance and aspiration precautions.  For Heart failure patients - Check your Weight same time everyday, if you gain over 2 pounds, or you develop in leg swelling, experience more shortness of breath or chest pain, call your Primary MD immediately. Follow Cardiac Low Salt Diet and 1.5 lit/day fluid restriction.   On your next visit with your primary care physician please Get Medicines reviewed and adjusted.   Please request your Prim.MD to go over all Hospital Tests and Procedure/Radiological results at the follow up, please get all Hospital records sent to your Prim MD by signing hospital release before you go home.   If you experience worsening of your admission symptoms, develop shortness of breath, life threatening emergency, suicidal or homicidal thoughts you must seek medical attention immediately by calling 911 or calling your MD immediately  if symptoms less severe.  You Must read complete instructions/literature along with all the possible adverse reactions/side effects for all the Medicines you take and that have been prescribed to you. Take any new Medicines after you have completely understood and accpet all the possible adverse reactions/side effects.   Do not drive, operating heavy machinery, perform activities at heights, swimming or participation in water activities or provide baby sitting services if your were admitted for syncope or siezures until you have seen by Primary MD or a Neurologist and advised to do so again.  Do not drive when taking Pain medications.    Do not take more than prescribed Pain, Sleep and Anxiety  Medications  Special Instructions: If you have smoked or chewed Tobacco  in the last 2 yrs please stop smoking, stop any regular Alcohol  and or any Recreational drug use.  Wear Seat belts while driving.   Please note  You were cared for by a hospitalist during your hospital stay. If you have any questions about your discharge medications or the care you received while you were in the hospital after you are discharged, you can call the unit and asked to speak with the hospitalist on call if the hospitalist that took care of you is not available. Once you are discharged, your primary care physician will handle any further medical issues. Please note that NO REFILLS for any discharge medications will be authorized once you are discharged, as it is imperative that you return to your primary care physician (or establish a relationship with a primary care physician if you do not have one) for your aftercare needs so that they can reassess your need for medications and monitor your lab values.

## 2014-12-26 NOTE — Care Management Note (Signed)
Case Management Note  Patient Details  Name: [name] MRN: [id number] Date of Birth: 1941-04-04  Subjective/Objective:  Noted patient has [facility] RN order. Per patient/spouse-if they can be taught how to empty f/c, they do not want [facility] to teach them how to empty f/c.  Nsg updated to teach, & document if there is a deficit for validation for [facility] to teach f/c care.MD notified.                  Action/Plan:d/c plan home.   Expected Discharge Date:  12/26/14               Expected Discharge Plan:  Home/Self Care  In-House Referral:     Discharge planning Services  CM Consult  Post Acute Care Choice:    Choice offered to:     DME Arranged:    DME Agency:     HH Arranged:    HH Agency:     Status of Service:  Completed, signed off  Medicare Important Message Given:  Yes-second notification given Date Medicare IM Given:    Medicare IM give by:    Date Additional Medicare IM Given:    Additional Medicare Important Message give by:     If discussed at [place] of Stay Meetings, dates discussed:    Additional Comments:  Dessa Phi, RN 12/26/2014, 12:35 PM

## 2014-12-26 NOTE — Telephone Encounter (Signed)
Per MD, pt discharged today, pt will not need additional labs on 8/10as labs were collected on 8/9 prior to dicharge. Called pt on home and mobile #. Unable to reach, lmovm at both numbers.

## 2014-12-26 NOTE — Discharge Summary (Addendum)
[name], is a 74 y.o. female  DOB Aug 27, 1940  MRN [id number].  Admission date:  12/23/2014  Admitting Physician  Albertine Patricia, MD  Discharge Date:  12/26/2014   Primary MD  [name], MD  Recommendations for primary care physician for things to follow:  - check CBC, CMP during next visit. - You'll be contacted by [facility] for an appointment for a voiding trial   Admission Diagnosis  Orthostatic hypotension [I95.1] Hyponatremia [E87.1] Nausea vomiting and diarrhea [R11.2, R19.7]   Discharge Diagnosis  Orthostatic hypotension [I95.1] Hyponatremia [E87.1] Nausea vomiting and diarrhea [R11.2, R19.7]   Principal Problem:   Hyponatremia Active Problems:   GERD   Small cell carcinoma of lung   Nausea with vomiting   Dehydration   Diarrhea   Thrombocytopenia   Hypocalcemia      Past Medical History  Diagnosis Date  . GERD (gastroesophageal reflux disease)   . IBS (irritable bowel syndrome)   . Adenomatous colon polyp 06/2009  . Hiatal hernia   . Breast cyst   . Diverticulosis   . Hemorrhoids   . Hyperlipidemia   . Hypertension   . Hyperparathyroidism   . Anxiety   . Migraine   . Osteoporosis   . Vitamin D deficiency   . Spinal stenosis   . C. difficile colitis 2006  . Small cell lung carcinoma dx'd 02/2014  . Bone metastases dx'd 02/2014    Past Surgical History  Procedure Laterality Date  . Cholecystectomy    . Vaginal hysterectomy    . Knee arthroscopy      left  . Parathyroidectomy  2009    [facility]  . Lumbar spine surgery  2001  . Breast biopsy      bilateral  . Total knee arthroplasty      right  . Bladder repair    . Video bronchoscopy Bilateral 03/14/2014    Procedure: VIDEO BRONCHOSCOPY WITHOUT FLUORO;  Surgeon: Tanda Rockers, MD;  Location: [facility];  Service: Cardiopulmonary;  Laterality: Bilateral;       History of present  illness and  Hospital Course:     Kindly see H&P for history of present illness and admission details, please review complete Labs, Consult reports and Test reports for all details in brief  HPI  from the history and physical done on the day of admission  [name] is a 74 y.o. 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 Earlie Server, patient presents with complaints of nausea, poor oral intake, weakness, and diarrhea for the last 3 days, patient reports she received her chemotherapy on Wednesday, started to have above complaints since, patient reports this is typical for her chemotherapy, but reported area of this time was more persistent than before, in ED patient was a febrile, workup was significant for hyponatremia of sodium of 122, Hypocalcemia with calcium of 5.9, white blood cell of 7.2, mild thrombocytopenia platelet count of 86, patient was orthostatic  in ED, patient reports she has been taking her Lomotil and Imodium as instructed, without much improvement of diarrhea, abdominal x-ray with no acute finding, hospitalist called to admit.  Hospital Course   Dehydration  - Secondary to diarrhea, poor oral intake from chemotherapy ,  - Treated with IV fluids during hospital stay, currently appropriately hydrated.  Diarrhea, nausea - This is related to her chemotherapy, continue with Imodium and Lomotil as needed - C. difficile is negative - Diarrhea significantly improved  Hyponatremia  - Patient with known chronic hyponatremia, most likely related to her lung cancer , worsened due to volume depletion and hypovolemia, encouraged to drink Gatorade , discontinue hydrochlorothiazide, significantly improved, sodium was 129 at day of discharge.  Hypokalemia  - Repleted   Hypocalcemia -continue with oral supplement, improving   Hypomagnesemia - Repleted, we'll discharge on oral magnesium  GERD - Continue with  PPI  Small cell carcinoma of the lung - Patient is followed by Dr. Earlie Server as an outpatient, oncology consult appreciated  Pancytopenia - This is most likely related to chemotherapy nadir , afebrile, no evidence of infection, will transfuse 1 unit PRBC today as per oncology recommendation giving her symptomatic anemia.  Urinary retention  - patient had an episode of urinary retention Foley was inserted, failed voiding attempt time 1, Foley catheter reinserted overnight, will discharge home with Foley catheter, with recommendation of voiding , [facility] office contacted, they will contact the patient with an appointment.  Tremors  - Continue with carbidopa-levodopa    Severe malnutrition in context of chronic illness Seen by nutrition service  Discharge Condition: stable   Follow UP  Follow-up Information    Follow up with [name], MD. Call in 4 days.   Specialty:  Internal Medicine   Why:  For voiding trial and to discontinue Foley catheter   Contact information:   [id number] [place] [address] [phone]       Follow up with Eilleen Kempf., MD.   Specialty:  Oncology   Why:  Keep your appointment   Contact information:   [place] [address] [phone]         Discharge Instructions  and  Discharge Medications     Discharge Instructions    Diet - low sodium heart healthy    Complete by:  As directed      Discharge instructions    Complete by:  As directed   Follow with Primary MD [name], MD in 5 days  - To have voiding trials and PCP office and DC Foley catheter.  Get CBC, CMP, 2 view Chest X ray checked  by Primary MD next visit.    Activity: As tolerated with Full fall precautions use walker/cane & assistance as needed   Disposition Home   Diet: Heart Healthy ** , with feeding assistance and aspiration precautions.  For Heart failure patients - Check your Weight same time everyday, if you gain  over 2 pounds, or you develop in leg swelling, experience more shortness of breath or chest pain, call your Primary MD immediately. Follow Cardiac Low Salt Diet and 1.5 lit/day fluid restriction.   On your next visit with your primary care physician please Get Medicines reviewed and adjusted.   Please request your Prim.MD to go over all Hospital Tests and Procedure/Radiological results at the follow up, please get all Hospital records sent to your Prim MD by signing hospital release before you go home.   If you experience worsening of  your admission symptoms, develop shortness of breath, life threatening emergency, suicidal or homicidal thoughts you must seek medical attention immediately by calling 911 or calling your MD immediately  if symptoms less severe.  You Must read complete instructions/literature along with all the possible adverse reactions/side effects for all the Medicines you take and that have been prescribed to you. Take any new Medicines after you have completely understood and accpet all the possible adverse reactions/side effects.   Do not drive, operating heavy machinery, perform activities at heights, swimming or participation in water activities or provide baby sitting services if your were admitted for syncope or siezures until you have seen by Primary MD or a Neurologist and advised to do so again.  Do not drive when taking Pain medications.    Do not take more than prescribed Pain, Sleep and Anxiety Medications  Special Instructions: If you have smoked or chewed Tobacco  in the last 2 yrs please stop smoking, stop any regular Alcohol  and or any Recreational drug use.  Wear Seat belts while driving.   Please note  You were cared for by a hospitalist during your hospital stay. If you have any questions about your discharge medications or the care you received while you were in the hospital after you are discharged, you can call the unit and asked to speak with the  hospitalist on call if the hospitalist that took care of you is not available. Once you are discharged, your primary care physician will handle any further medical issues. Please note that NO REFILLS for any discharge medications will be authorized once you are discharged, as it is imperative that you return to your primary care physician (or establish a relationship with a primary care physician if you do not have one) for your aftercare needs so that they can reassess your need for medications and monitor your lab values.     Increase activity slowly    Complete by:  As directed             Medication List    STOP taking these medications        hydrochlorothiazide 12.5 MG tablet  Commonly known as:  HYDRODIURIL     LORazepam 0.5 MG tablet  Commonly known as:  ATIVAN     rOPINIRole 1 MG tablet  Commonly known as:  REQUIP      TAKE these medications        atorvastatin 80 MG tablet  Commonly known as:  LIPITOR  Take 80 mg by mouth every morning.     buPROPion 150 MG 12 hr tablet  Commonly known as:  WELLBUTRIN SR  Take 150 mg by mouth every morning.     CALCIUM PO  Take 1 tablet by mouth 3 (three) times daily. Starting 06/21/14--taking '600mg'$  three times daily     carbidopa-levodopa 25-100 MG per tablet  Commonly known as:  SINEMET IR  Take 1 tablet by mouth 3 (three) times daily.     DEXILANT 60 MG capsule  Generic drug:  dexlansoprazole  TAKE ONE CAPSULE BY MOUTH DAILY     diphenoxylate-atropine 2.5-0.025 MG per tablet  Commonly known as:  LOMOTIL  TAKE 2 TABLETS BY MOUTH 4 TIMES DAILY AS NEEDED FOR DIARRHEA OR LOOSE STOOLS     feeding supplement (ENSURE COMPLETE) Liqd  Take 237 mLs by mouth 2 (two) times daily between meals.     glycopyrrolate 2 MG tablet  Commonly known as:  ROBINUL  TAKE 1 TABLET (  2 MG TOTAL) BY MOUTH 2 (TWO) TIMES DAILY.     lidocaine-prilocaine cream  Commonly known as:  EMLA  Apply 1 application topically as needed. Apply to port 1 hr  before chemo     loperamide 2 MG tablet  Commonly known as:  IMODIUM A-D  Take 2 mg by mouth 4 (four) times daily as needed for diarrhea or loose stools.     magnesium oxide 400 (241.3 MG) MG tablet  Commonly known as:  MAG-OX  Take 1 tablet (400 mg total) by mouth daily.  Start taking on:  12/27/2014     metoprolol succinate 50 MG 24 hr tablet  Commonly known as:  TOPROL-XL  Take 50 mg by mouth every morning.     MIRALAX PO  Take 1 scoop by mouth daily as needed (for constipation).     ondansetron 8 MG tablet  Commonly known as:  ZOFRAN  Take 1 tablet (8 mg total) by mouth every 8 (eight) hours as needed for nausea or vomiting.     oxyCODONE-acetaminophen 5-325 MG per tablet  Commonly known as:  PERCOCET/ROXICET  Take 1 tablet by mouth every 6 (six) hours as needed for severe pain.     potassium chloride SA 20 MEQ tablet  Commonly known as:  K-DUR,KLOR-CON  Take 20 mEq by mouth 2 (two) times daily.     prochlorperazine 10 MG tablet  Commonly known as:  COMPAZINE  Take 1 tablet (10 mg total) by mouth every 6 (six) hours as needed for nausea or vomiting.          Diet and Activity recommendation: See Discharge Instructions above   Consults obtained -  oncology   Major procedures and Radiology Reports - PLEASE review detailed and final reports for all details, in brief -      Ct Chest W Contrast  12/11/2014   CLINICAL DATA:  Subsequent encounter for small-cell lung cancer.  EXAM: CT CHEST, ABDOMEN, AND PELVIS WITH CONTRAST  TECHNIQUE: Multidetector CT imaging of the chest, abdomen and pelvis was performed following the standard protocol during bolus administration of intravenous contrast.  CONTRAST:  132m OMNIPAQUE IOHEXOL 300 MG/ML  SOLN  COMPARISON:  10/13/2014.  FINDINGS: CT CHEST FINDINGS  Mediastinum/Nodes: Right-sided Port-A-Cath tip is positioned at the junction of the SVC and RA. There is no axillary lymphadenopathy. 15 mm precarinal lymph node measured on  the previous study is now 13 mm in short axis. 21 mm right hilar lymph node measured previously is canal 18 mm in the same dimension and measures 12 mm in short axis. 16 mm short axis subcarinal lymph node measured on the previous study is now 10 mm in short axis. No left hilar lymphadenopathy. Heart size is normal. No pericardial effusion.  The 11 x 18 mm paraspinal nodule at the T11 level now measures 16 x 11 mm. A more ill-defined right paraspinal plaque-like lesion (image 42 series 2) measures 8 x 30 mm today compared to 12 x 31 mm previously.  Lungs/Pleura: Emphysematous changes noted in the lungs bilaterally. Tree in bud nodularity seen in the posterior right upper lobe previously is again noted but appears slightly decreased in the interval. The dominant 7 mm nodule seen in this region on the prior study is now 5 mm. 3 mm nodule along the anterior aspect of the right major fissure was 4-5 mm previously. Previously described subpleural nodule within the right lower lobe (image 31 series 4 today) is not substantially changed in the interval  measuring 6 x 19 mm compared to 6 x 20 mm previously. This is in the region of the prominent bony spur and pathologic vertebral collapse. Other scattered smaller pleural nodules are seen along the right major fissure. No suspicious nodule or mass in the left lung. There is no pulmonary edema pleural effusion. No focal airspace consolidation.  Musculoskeletal: Multiple sclerotic lesions are seen throughout the thoracic spine. Compression deformity of the T9 vertebral body is stable and likely pathologic in nature.  CT ABDOMEN AND PELVIS FINDINGS  Hepatobiliary: Stable mild prominence of the intrahepatic biliary ducts. Tiny 6 mm hypo attenuating lesion in the posterior right liver (image 52 series 2) is stable in the interval. No other focal liver lesion is evident. Gallbladder is surgically absent. No dilatation of the extrahepatic bile ducts.  Pancreas: No focal mass lesion.  No dilatation of the main duct. No intraparenchymal cyst. No peripancreatic edema.  Spleen: No splenomegaly. No focal mass lesion.  Adrenals/Urinary Tract: Stable 11 mm right adrenal nodule. No discrete left adrenal nodule. Areas of cortical scarring are seen in the kidneys bilaterally. 2.6 cm cyst identified at the lower pole the right kidney. 3.7 cm cyst identified in the lower pole of the left kidney. Other smaller cysts are noted in the kidneys bilaterally.  Stomach/Bowel: Stomach is nondistended. No gastric wall thickening. No evidence of outlet obstruction. A very large duodenum diverticulum is evident. No small bowel wall thickening. No small bowel dilatation. Terminal ileum is normal. The appendix is normal. Prominent diffuse stool volume raises the question of, but is not diagnostic for constipation. There is left colonic diverticulosis without diverticulitis.  Vascular/Lymphatic: There is abdominal aortic atherosclerosis without aneurysm. Infrarenal abdominal aorta measures up to 2.9 cm in diameter which is upper normal. No gastrohepatic or hepatoduodenal ligament lymphadenopathy. The portal vein is patent. No retroperitoneal lymphadenopathy. No pelvic sidewall lymphadenopathy.  Reproductive: Uterus is surgically absent.  No adnexal mass.  Other: No intraperitoneal free fluid.  Musculoskeletal: Numerous sclerotic lesions are seen involving bony anatomy of the pelvis. These appear relatively stable in the interval with no definite new lesion or progressive lesion evident.  IMPRESSION: 1. Interval decrease in mediastinal lymphadenopathy. Small pleural-based and paraspinal enhancing nodules are also decreased. 2. Multiple pulmonary nodules again identified, as before. The show slight interval decrease in size. No new or progressive pulmonary nodule is evident. 3. Stable 11 mm right adrenal nodule. 4. No new or progressive disease in the abdomen or pelvis to suggest metastatic progression. 5. Numerous  sclerotic bony metastases with stable compression deformity of the T9 vertebral body, likely pathologic. Bony metastatic involvement does not appear progressed in the interval.   Electronically Signed   By: Misty Stanley M.D.   On: 12/11/2014 16:00   Ct Abdomen Pelvis W Contrast  12/11/2014   CLINICAL DATA:  Subsequent encounter for small-cell lung cancer.  EXAM: CT CHEST, ABDOMEN, AND PELVIS WITH CONTRAST  TECHNIQUE: Multidetector CT imaging of the chest, abdomen and pelvis was performed following the standard protocol during bolus administration of intravenous contrast.  CONTRAST:  157m OMNIPAQUE IOHEXOL 300 MG/ML  SOLN  COMPARISON:  10/13/2014.  FINDINGS: CT CHEST FINDINGS  Mediastinum/Nodes: Right-sided Port-A-Cath tip is positioned at the junction of the SVC and RA. There is no axillary lymphadenopathy. 15 mm precarinal lymph node measured on the previous study is now 13 mm in short axis. 21 mm right hilar lymph node measured previously is canal 18 mm in the same dimension and measures 12 mm in short  axis. 16 mm short axis subcarinal lymph node measured on the previous study is now 10 mm in short axis. No left hilar lymphadenopathy. Heart size is normal. No pericardial effusion.  The 11 x 18 mm paraspinal nodule at the T11 level now measures 16 x 11 mm. A more ill-defined right paraspinal plaque-like lesion (image 42 series 2) measures 8 x 30 mm today compared to 12 x 31 mm previously.  Lungs/Pleura: Emphysematous changes noted in the lungs bilaterally. Tree in bud nodularity seen in the posterior right upper lobe previously is again noted but appears slightly decreased in the interval. The dominant 7 mm nodule seen in this region on the prior study is now 5 mm. 3 mm nodule along the anterior aspect of the right major fissure was 4-5 mm previously. Previously described subpleural nodule within the right lower lobe (image 31 series 4 today) is not substantially changed in the interval measuring 6 x 19 mm  compared to 6 x 20 mm previously. This is in the region of the prominent bony spur and pathologic vertebral collapse. Other scattered smaller pleural nodules are seen along the right major fissure. No suspicious nodule or mass in the left lung. There is no pulmonary edema pleural effusion. No focal airspace consolidation.  Musculoskeletal: Multiple sclerotic lesions are seen throughout the thoracic spine. Compression deformity of the T9 vertebral body is stable and likely pathologic in nature.  CT ABDOMEN AND PELVIS FINDINGS  Hepatobiliary: Stable mild prominence of the intrahepatic biliary ducts. Tiny 6 mm hypo attenuating lesion in the posterior right liver (image 52 series 2) is stable in the interval. No other focal liver lesion is evident. Gallbladder is surgically absent. No dilatation of the extrahepatic bile ducts.  Pancreas: No focal mass lesion. No dilatation of the main duct. No intraparenchymal cyst. No peripancreatic edema.  Spleen: No splenomegaly. No focal mass lesion.  Adrenals/Urinary Tract: Stable 11 mm right adrenal nodule. No discrete left adrenal nodule. Areas of cortical scarring are seen in the kidneys bilaterally. 2.6 cm cyst identified at the lower pole the right kidney. 3.7 cm cyst identified in the lower pole of the left kidney. Other smaller cysts are noted in the kidneys bilaterally.  Stomach/Bowel: Stomach is nondistended. No gastric wall thickening. No evidence of outlet obstruction. A very large duodenum diverticulum is evident. No small bowel wall thickening. No small bowel dilatation. Terminal ileum is normal. The appendix is normal. Prominent diffuse stool volume raises the question of, but is not diagnostic for constipation. There is left colonic diverticulosis without diverticulitis.  Vascular/Lymphatic: There is abdominal aortic atherosclerosis without aneurysm. Infrarenal abdominal aorta measures up to 2.9 cm in diameter which is upper normal. No gastrohepatic or hepatoduodenal  ligament lymphadenopathy. The portal vein is patent. No retroperitoneal lymphadenopathy. No pelvic sidewall lymphadenopathy.  Reproductive: Uterus is surgically absent.  No adnexal mass.  Other: No intraperitoneal free fluid.  Musculoskeletal: Numerous sclerotic lesions are seen involving bony anatomy of the pelvis. These appear relatively stable in the interval with no definite new lesion or progressive lesion evident.  IMPRESSION: 1. Interval decrease in mediastinal lymphadenopathy. Small pleural-based and paraspinal enhancing nodules are also decreased. 2. Multiple pulmonary nodules again identified, as before. The show slight interval decrease in size. No new or progressive pulmonary nodule is evident. 3. Stable 11 mm right adrenal nodule. 4. No new or progressive disease in the abdomen or pelvis to suggest metastatic progression. 5. Numerous sclerotic bony metastases with stable compression deformity of the T9 vertebral body,  likely pathologic. Bony metastatic involvement does not appear progressed in the interval.   Electronically Signed   By: Misty Stanley M.D.   On: 12/11/2014 16:00   US Renal  12/26/2014   CLINICAL DATA:  Patient currently on chemotherapy for stage IV small-cell lung cancer, now with acute urinary retention which began yesterday.  EXAM: RENAL / URINARY TRACT ULTRASOUND COMPLETE  COMPARISON:  No prior ultrasound. CT abdomen and pelvis 10/13/2014 and earlier.  FINDINGS: Right Kidney:  Length: Approximately 11.0 cm. Approximate 3.1 x 2.6 x 2.7 cm simple cyst arising from the lower pole as noted on prior CTs. No evidence of solid parenchymal mass. No hydronephrosis. Mild cortical thinning expected for age. Mildly echogenic parenchyma.  Left Kidney:  Length: Approximately 11.2 cm. Approximate 4.2 x 4.1 x 3.7 cm simple cyst arising from the lower pole and approximate 2.1 x 2.0 x 2.2 cm cyst with internal echoes arising from the mid pole as noted on prior CTs. No evidence of solid parenchymal  mass. No hydronephrosis. Small extrarenal pelvis as noted on prior CTs. Mild cortical thinning expected for age. Mildly echogenic parenchyma.  Bladder:  Decompressed by Foley catheter.  IMPRESSION: 1. No evidence of hydronephrosis involving either kidney. 2. Bilateral renal cysts as noted on prior CTs. 3. Mild cortical thinning consistent with age. Mildly echogenic renal parenchyma bilaterally may indicate early medical renal disease.   Electronically Signed   By: Evangeline Dakin M.D.   On: 12/26/2014 10:16   Dg Abd Acute W/chest  12/23/2014   CLINICAL DATA:  Small cell carcinoma of the lung with osseous metastases, received chemotherapy 3 days ago, diarrhea since, unable to eat or drink, RIGHT lower abdominal pain  EXAM: DG ABDOMEN ACUTE W/ 1V CHEST  COMPARISON:  Chest radiograph 02/24/2014; CT chest abdomen pelvis 12/11/2014  FINDINGS: RIGHT jugular Port-A-Cath with tip projecting over SVC.  Normal heart size, mediastinal contours and pulmonary vascularity.  Known mediastinal and RIGHT hilar adenopathy not well visualized radiographically.  Atherosclerotic calcifications aorta.  Lungs emphysematous with mild central peribronchial thickening.  No infiltrate, pleural effusion or pneumothorax.  Normal bowel gas pattern.  No bowel dilatation or bowel wall thickening, or free intraperitoneal air.  Prior L4-S1 fusion.  Degenerative disc disease changes thoracic spine.  No definite urinary tract calcification.  Diffuse osseous metastases.  IMPRESSION: No acute abnormalities.  Emphysematous changes.  Osseous metastatic disease.   Electronically Signed   By: Lavonia Dana M.D.   On: 12/23/2014 16:21    Micro Results    Recent Results (from the past 240 hour(s))  C difficile quick scan w PCR reflex     Status: None   Collection Time: 12/24/14  9:20 AM  Result Value Ref Range Status   C Diff antigen NEGATIVE NEGATIVE Final   C Diff toxin NEGATIVE NEGATIVE Final   C Diff interpretation Negative for toxigenic C.  difficile  Final       Today   Subjective:   Kiyona Mcnall today has no headache,no chest abdominal pain, diarrhea still present but significantly improved, continues to have urinary retention overnight, so foley catheter was reinserted.  Objective:   Blood pressure 127/68, pulse 111, temperature 98.6 F (37 C), temperature source Oral, resp. rate 16, height '5\' 5"'$  (1.651 m), weight 51.5 kg (113 lb 8.6 oz), SpO2 97 %.   Intake/Output Summary (Last 24 hours) at 12/26/14 1207 Last data filed at 12/26/14 1017  Gross per 24 hour  Intake   3080 ml  Output  2150 ml  Net    930 ml    Exam Awake Alert, Oriented X 3, [place].AT,PERRAL Supple neck ,dry oral mucosa  Symmetrical Chest wall movement, Good air movement bilaterally,  RRR,No Gallops,Rubs or new Murmurs, No Parasternal Heave +ve B.Sounds, Abd Soft, No tenderness, No organomegaly appriciated, No rebound - guarding or rigidity. No Cyanosis, Clubbing or edema, No new Rash or bruise , + tremors   Data Review   CBC w Diff: Lab Results  Component Value Date   WBC 1.5* 12/26/2014   WBC 2.0* 12/20/2014   HGB 7.5* 12/26/2014   HGB 9.7* 12/20/2014   HCT 21.0* 12/26/2014   HCT 27.8* 12/20/2014   PLT 61* 12/26/2014   PLT 101* 12/20/2014   LYMPHOPCT 8* 12/23/2014   LYMPHOPCT 35.7 12/20/2014   MONOPCT 5 12/23/2014   MONOPCT 11.3 12/20/2014   EOSPCT 0 12/23/2014   EOSPCT 2.7 12/20/2014   BASOPCT 0 12/23/2014   BASOPCT 0.7 12/20/2014    CMP: Lab Results  Component Value Date   NA 129* 12/26/2014   NA 128* 12/20/2014   K 3.7 12/26/2014   K 4.4 12/20/2014   CL 95* 12/26/2014   CO2 28 12/26/2014   CO2 28 12/20/2014   BUN 6 12/26/2014   BUN 11.4 12/20/2014   CREATININE 0.69 12/26/2014   CREATININE 0.8 12/20/2014   PROT 5.9* 12/24/2014   PROT 6.2* 12/20/2014   ALBUMIN 3.5 12/24/2014   ALBUMIN 3.6 12/20/2014   BILITOT 1.1 12/24/2014   BILITOT 0.87 12/20/2014   ALKPHOS 61 12/24/2014   ALKPHOS 70 12/20/2014   AST 13*  12/24/2014   AST 11 12/20/2014   ALT 8* 12/24/2014   ALT 8 12/20/2014  .   Total Time in preparing paper work, data evaluation and todays exam - 35 minutes  Everlina Gotts M.D on 12/26/2014 at 12:07 PM  [facility]   Office  [phone]

## 2014-12-26 NOTE — Progress Notes (Signed)
CRITICAL VALUE ALERT  Critical value received:  Magnesium 0.7  Date of notification:  12/26/2014  Time of notification:  0529  Critical value read back:Yes.    Nurse who received alert:  Sabino Gasser, RN  MD notified (1st page):  Tylene Fantasia, NP  Time of first page:  [phone]  MD notified (2nd page):  Time of second page:  Responding MD:  Tylene Fantasia, NP  Time MD responded:  [phone]

## 2014-12-26 NOTE — Progress Notes (Signed)
Completed D/C teaching with patient and family. Answered all questions and demonstrated foley care. Patient will be discharged home with husband in stable condition.  Cathie Hoops, RN

## 2014-12-26 NOTE — Progress Notes (Signed)
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 At the ED she was found to be febrile. She denied any chills or night sweats, vision changes, or mucositis. Denies any worsening shortness of breath. Denies any chest pain or palpitations. Denies lower extremity swelling. Denies any dysuria. Denies abnormal skin rashes, or neuropathy. Denies any bleeding issues such as epistaxis, hematemesis, hematuria or hematochezia. Ambulating without difficulty.Labs were remarkable for hyponatremia (Na 122), Hypocalcemia (Ca 5.9) and thrombocytopenia (plts 86k). Imaging and other labs were unremarkable. She is being treated treated conservativelyMost recent labs however, show development of pancytopenia, with a WBC 1.5 (no diff available), Hb7.5 and platelets 61k, likely due to recent chemo. We have been notified of the patient's admission to help in the management of her abnormal hematological findings.  DIAGNOSIS: Extensive stage, stage IV (T3, N2, M1b) small cell lung cancer diagnosed in October 2015.  PRIOR THERAPY: Systemic chemotherapy with carboplatin for AUC of 5 on day 1 and etoposide 100 MG/M2 on days 1, 2 and 3 with Neulasta support on day 4. Status post 6 cycles. Starting from cycle #5 carboplatin with for AUC of 4 and etoposide 80 MG/M2  CURRENT THERAPY: The patient restarted systemic chemotherapy with cisplatin 25 mg meter squared and irinotecan 65 mg meter squared given day 1 and day 8 of each cycle. She started this on 10/25/2014. This was reduced to cisplatin 20 MG/M2 and irinotecan '45MG'$ /M2 starting  with the beginning of cycle #2. She is status post cycle 3 D1 12/13/14-D8 on 8/3, with Neulasta on 8/4  Scheduled Meds: . atorvastatin  80 mg Oral QPC supper  . buPROPion  150 mg Oral q morning - 10a  . calcium carbonate  500 mg of elemental calcium Oral TID  . carbidopa-levodopa  1 tablet Oral TID WC  . feeding supplement (ENSURE ENLIVE)  237 mL Oral BID BM  . glycopyrrolate  2 mg Oral BID  . magnesium sulfate 1 - 4 g bolus IVPB  2 g Intravenous Once  . pantoprazole  40 mg Oral Daily  . potassium chloride SA  20 mEq Oral BID  . sodium chloride  3 mL Intravenous Q12H   Continuous Infusions: . sodium chloride 100 mL/hr at 12/25/14 2159   PRN Meds:.acetaminophen **OR** acetaminophen, albuterol, bisacodyl, diphenoxylate-atropine, HYDROcodone-acetaminophen, loperamide, ondansetron, ondansetron **OR** ondansetron (ZOFRAN) IV, oxyCODONE-acetaminophen, polyethylene glycol, prochlorperazine, sodium chloride  Objective:  Filed Vitals:   12/26/14 0527  BP: 133/69  Pulse: 84  Temp: 98.2 F (36.8 C)  Resp: 16      Intake/Output Summary (Last 24 hours) at 12/26/14 0716 Last data filed at 12/26/14 0700  Gross per 24 hour  Intake   3190 ml  Output   3150 ml  Net     40 ml     GENERAL:alert, no distress and uncomfortable due to nausea SKIN: skin color, texture, turgor are pale, no rashes or significant lesions EYES: normal, conjunctiva are pink and non-injected, sclera clear OROPHARYNX:no exudate, no erythema and lips, buccal mucosa, and tongue normal  NECK: supple, thyroid normal size, non-tender, without nodularity LYMPH:  no palpable lymphadenopathy in the cervical, axillary or inguinal LUNGS: clear to auscultation and percussion with normal breathing effort HEART: regular rate & rhythm and no murmurs and no lower extremity edema ABDOMEN: soft,  Mildly tender and normal bowel sounds Musculoskeletal:no cyanosis of digits and no clubbing  PSYCH: alert & oriented x 3 with fluent  speech NEURO: no focal motor/sensory deficits other than known tremors.    CBG (last 3)  No results for input(s): GLUCAP in the last 72 hours.   Labs:   Recent Labs Lab 12/20/14 0814 12/23/14 1540 12/23/14 1944 12/24/14 0423 12/25/14 0411 12/26/14 0450  WBC 2.0* 7.2 5.4 4.4 1.7* 1.5*  HGB 9.7* 10.0* 9.1* 8.9* 7.8* 7.5*  HCT 27.8* 28.1* 25.6* 24.8* 22.0* 21.0*  PLT 101* 86* 64* 55* 40* 61*  MCV 85.8 84.4 85.0 84.9 84.9 84.7  MCH 29.9 30.0 30.2 30.5 30.1 30.2  MCHC 34.8 35.6 35.5 35.9 35.5 35.7  RDW 16.6* 15.6* 15.7* 15.7* 15.6* 15.5  LYMPHSABS 0.7* 0.6*  --   --   --   --   MONOABS 0.2 0.4  --   --   --   --   EOSABS 0.1 0.0  --   --   --   --   BASOSABS 0.0 0.0  --   --   --   --      Chemistries:    Recent Labs Lab 12/20/14 0814 12/23/14 1540 12/23/14 1944 12/24/14 0423 12/25/14 0411 12/26/14 0450  NA 128* 122*  --  123* 126* 129*  K 4.4 4.0  --  3.4* 3.5 3.7  CL  --  84*  --  92* 94* 95*  CO2 28 25  --  '23 26 28  '$ GLUCOSE 96 88  --  87 108* 95  BUN 11.4 21*  --  '18 10 6  '$ CREATININE 0.8 1.11* 0.96 0.87 0.70 0.69  CALCIUM 7.3* 5.9*  --  6.3* 7.5* 8.0*  MG  --   --  1.0*  --   --  0.7*  AST 11 16  --  13*  --   --   ALT 8 8*  --  8*  --   --   ALKPHOS 70 71  --  61  --   --   BILITOT 0.87 1.6*  --  1.1  --   --     GFR Estimated Creatinine Clearance: 50.9 mL/min (by C-G formula based on Cr of 0.69).  Liver Function Tests:  Recent Labs Lab 12/20/14 0814 12/23/14 1540 12/24/14 0423  AST 11 16 13*  ALT 8 8* 8*  ALKPHOS 70 71 61  BILITOT 0.87 1.6* 1.1  PROT 6.2* 6.8 5.9*  ALBUMIN 3.6 4.0 3.5    Recent Labs Lab 12/23/14 1540  LIPASE 17*   No results for input(s): AMMONIA in the last 168 hours.  Urine Studies Negative  Microbiology Recent Results (from the past 240 hour(s))  C difficile quick scan w PCR reflex     Status: None   Collection Time: 12/24/14  9:20 AM  Result Value Ref Range Status   C Diff antigen NEGATIVE NEGATIVE Final    C Diff toxin NEGATIVE NEGATIVE Final   C Diff interpretation Negative for toxigenic C. difficile  Final       Imaging Studies:  No results found.  Assessment/Plan: 74 y.o.     Extensive stage small cell lung cancer  The patient is now on cisplatin and irinotecan on days 1  and 8 every 3 weeks. These were reduced due to poor tolerance starting with the start of cycle 2.  The patient is status post 3 cycles.  Recent CT of the chest abdomen and pelvis showed a decrease in the size of her pulmonary nodules and lymph nodes.  As she is clearly responding well to treatment, she will continue treatment as outpatient   Metastatic bone disease:  The patient will continue on Xgeva 120 mcg subcutaneously every 4 weeks.  Nausea, diarrhea Likely due to recent chemo, hyponatremia Cultures negative for C diff Continue conservative management Appreciate primary team's involvement  Hyponatremia THis is secondary to malignancy Improving, currently at 129 Appreciate primary team's involvement.  Chemotherapy-induced anemia  We'll continue to monitor for now May need transfusion for a Hb 7.5 as she appears to be symptomatic.  Leukopenia Likely due to recent chemo Current WBC 1.5, check differential She had recent Neulasta on 8/4, would not initiate yet any GCSF additional treatment Continue to monitor.  Thrombocytopenia This is due to recent chemotherapy, dilution No bleeding issues reported at this time Monitor counts closely No transfusion is indicated at this time Transfuse 1 unit of platelets if count is less or equal than 10,000 or 20,000 if the patient is acutely bleeding  Malnutrition Appreciate Nutrition follow up  Deconditioning Appreciate PT/OT involvement  DVT prophylaxis On SCDs  DNR  Other medical issues including hypocalcemia and hypokalemia, hyponatremia  as per admitting team     [facility], PA-C 12/26/2014  7:16 AM  ADDENDUM: Hematology/Oncology  Attending: The patient is seen and examined today. I agree with the above note. This is a very pleasant 74 years old white female with extensive stage small cell lung cancer who is currently undergoing second line systemic chemotherapy with reduced dose carboplatin and irinotecan status post 3 cycles. The patient has improvement in her disease with this treatment but unfortunately has rough time tolerating it. She was admitted to [facility] complaining of increasing fatigue and weakness as well as nausea and decreased by mouth intake and diarrhea. She was also febrile but no chills. She feels much better today after receiving IV hydration. She continues to have pancytopenia secondary to recent chemotherapy. I will continue to monitor her total white blood count as well as absolute neutrophil count closely. The patient may benefit from 1 unit of PRBCs transfusion before discharge home. We will continue to monitor have an outpatient basis after discharge. Thank you so much for taking good care of Mrs. March, I will continue to follow up the patient with you and assist in her management an as-needed basis.

## 2014-12-27 ENCOUNTER — Other Ambulatory Visit: Payer: Medicare Other

## 2014-12-27 LAB — TYPE AND SCREEN
ABO/RH(D): O POS
ANTIBODY SCREEN: NEGATIVE
UNIT DIVISION: 0

## 2015-01-02 ENCOUNTER — Ambulatory Visit
Admission: RE | Admit: 2015-01-02 | Discharge: 2015-01-02 | Disposition: A | Discharge status: Home / Self Care | Payer: Medicare Other | Source: Ambulatory Visit | Attending: Radiation Oncology | Admitting: Radiation Oncology

## 2015-01-02 ENCOUNTER — Encounter: Payer: Self-pay | Admitting: Radiation Oncology

## 2015-01-02 VITALS — BP 95/63 | HR 107 | Temp 98.4°F | Ht 65.0 in | Wt 105.0 lb

## 2015-01-02 DIAGNOSIS — C801 Malignant (primary) neoplasm, unspecified: Secondary | ICD-10-CM | POA: Diagnosis not present

## 2015-01-02 DIAGNOSIS — C7951 Secondary malignant neoplasm of bone: Secondary | ICD-10-CM

## 2015-01-02 NOTE — Progress Notes (Addendum)
[facility] NEW PATIENT EVALUATION  Name: [name] MRN: [id number]  Date:   01/02/2015           DOB: Sep 28, 1940  Status: outpatient   CC: Mandy Panda, MD  Curt Bears, MD    REFERRING PHYSICIAN: Curt Bears, MD   DIAGNOSIS:  Extensive stage small cell carcinoma the lung, metastatic to bone  HISTORY OF PRESENT ILLNESS:  [name] is a 75 y.o. female who is seen today through the courtesy of Dr. Julien Nordmann for consideration of palliative radiotherapy in the management of her metastatic small cell carcinoma the lung to bone.  In October 2015, she presented with a three-week history of back pain and cough.  She was found to have small cell carcinoma the right lung at the time of bronchoscopy  with Dr. Melvyn Novas on 03/14/2014.  Her staging PET scan on 03/31/2014 showed hypermetabolic mediastinal and right hilar adenopathy as well as hypermetabolic right lower lobe nodules.  There were multifocal liver metastases and multifocal hypermetabolic bone metastases with a lytic lesion involving the T9 vertebra.  There was also diffuse sclerosis involving the T12 vertebra in addition to a lytic lesion within the right iliac bone.  She went on to receive chemotherapy with Dr. Julien Nordmann.  Her staging CT scans from 12/11/2014 showed interval decrease in mediastinal lymphadenopathy with also decrease in size of her pulmonary nodules.  Her metastatic disease to bone appear to be stable although she still had a vertebral body compression fracture at T9.  She describes pain along her right upper flank with some intermittent discomfort along her right shoulder.  She has been using heating pads along her flank and has taken Percocet when necessary.  However, Percocet makes her nauseated.  Her appetite is poor and she tells me she is lost approximately 10 pounds over the past 2 months.  She is scheduled for chemotherapy tomorrow as well as her blood counts are  satisfactory.  PREVIOUS RADIATION THERAPY: No   PAST MEDICAL HISTORY:  has a past medical history of GERD (gastroesophageal reflux disease); IBS (irritable bowel syndrome); Adenomatous colon polyp (06/2009); Hiatal hernia; Breast cyst; Diverticulosis; Hemorrhoids; Hyperlipidemia; Hypertension; Hyperparathyroidism; Anxiety; Migraine; Osteoporosis; Vitamin D deficiency; Spinal stenosis; C. difficile colitis (2006); Small cell lung carcinoma (dx'd 02/2014); and Bone metastases (dx'd 02/2014).     PAST SURGICAL HISTORY:  Past Surgical History  Procedure Laterality Date  . Cholecystectomy    . Vaginal hysterectomy    . Knee arthroscopy      left  . Parathyroidectomy  2009    [facility]  . Lumbar spine surgery  2001  . Breast biopsy      bilateral  . Total knee arthroplasty      right  . Bladder repair    . Video bronchoscopy Bilateral 03/14/2014    Procedure: VIDEO BRONCHOSCOPY WITHOUT FLUORO;  Surgeon: Tanda Rockers, MD;  Location: [facility];  Service: Cardiopulmonary;  Laterality: Bilateral;     FAMILY HISTORY: family history includes Breast cancer in her sister; Colon polyps in her sister; Diabetes in her brother and sister; Irritable bowel syndrome in her sister. There is no history of Colon cancer.  Both her mother and father died in their late 78s, her father from a gunshot wound, in her mother in a motor vehicle accident.   SOCIAL HISTORY:  reports that she quit smoking about 19 months ago. Her smoking use included Cigarettes. She has a 6.25 pack-year smoking history. She has never  used smokeless tobacco. She reports that she does not drink alcohol or use illicit drugs.  Married, 2 children.  She worked in a [employer].   ALLERGIES: Ace inhibitors; Cephalexin; Etodolac; and Gabapentin   MEDICATIONS:  Current Outpatient Prescriptions  Medication Sig Dispense Refill  . atorvastatin (LIPITOR) 80 MG tablet Take 80 mg by mouth every morning.     Marland Kitchen buPROPion  (WELLBUTRIN SR) 150 MG 12 hr tablet Take 150 mg by mouth every morning.     Marland Kitchen CALCIUM PO Take 1 tablet by mouth 3 (three) times daily. Starting 06/21/14--taking '600mg'$  three times daily    . carbidopa-levodopa (SINEMET IR) 25-100 MG per tablet Take 1 tablet by mouth 3 (three) times daily. 270 tablet 1  . DEXILANT 60 MG capsule TAKE ONE CAPSULE BY MOUTH DAILY 30 capsule 0  . diphenoxylate-atropine (LOMOTIL) 2.5-0.025 MG per tablet Take 2 tablets by mouth 4 (four) times daily as needed for diarrhea or loose stools. 30 tablet 1  . glycopyrrolate (ROBINUL) 2 MG tablet TAKE 1 TABLET (2 MG TOTAL) BY MOUTH 2 (TWO) TIMES DAILY. 60 tablet 0  . lidocaine-prilocaine (EMLA) cream Apply 1 application topically as needed. Apply to port 1 hr before chemo 30 g 0  . loperamide (IMODIUM A-D) 2 MG tablet Take 2 mg by mouth 4 (four) times daily as needed for diarrhea or loose stools.    . magnesium oxide (MAG-OX) 400 (241.3 MG) MG tablet Take 1 tablet (400 mg total) by mouth daily. 30 tablet 0  . metoprolol (TOPROL-XL) 50 MG 24 hr tablet Take 50 mg by mouth every morning.     . ondansetron (ZOFRAN) 8 MG tablet Take 1 tablet (8 mg total) by mouth every 8 (eight) hours as needed for nausea or vomiting. 20 tablet 0  . oxyCODONE-acetaminophen (PERCOCET/ROXICET) 5-325 MG per tablet Take 1 tablet by mouth every 6 (six) hours as needed for severe pain. 30 tablet 0  . Polyethylene Glycol 3350 (MIRALAX PO) Take 1 scoop by mouth daily as needed (for constipation).     . potassium chloride SA (K-DUR,KLOR-CON) 20 MEQ tablet Take 20 mEq by mouth 2 (two) times daily.    . prochlorperazine (COMPAZINE) 10 MG tablet Take 1 tablet (10 mg total) by mouth every 6 (six) hours as needed for nausea or vomiting. 30 tablet 1  . feeding supplement, ENSURE COMPLETE, (ENSURE COMPLETE) LIQD Take 237 mLs by mouth 2 (two) times daily between meals. (Patient not taking: Reported on 01/02/2015) 237 mL 0   No current facility-administered medications for  this encounter.     REVIEW OF SYSTEMS:  Pertinent items are noted in HPI.    PHYSICAL EXAM:  height is '5\' 5"'$  (1.651 m) and weight is 105 lb (47.628 kg). Her temperature is 98.4 F (36.9 C). Her blood pressure is 95/63 and her pulse is 107. Her oxygen saturation is 98%.   Alert and oriented 74 year old white female who appears cachectic.  Head and neck examination: Remarkable for alopecia.  Nodes: Without palpable cervical or supraclavicular lymphadenopathy.  Chest: Lungs clear on the breath sounds are distant.  Back: There is pain described along the right paravertebral T9-T12 region.  Extremities: There is pain described along the right glenoid although there is no palpable discomfort.  Neurologic examination: Grossly nonfocal.   LABORATORY DATA:  Lab Results  Component Value Date   WBC 1.5* 12/26/2014   HGB 7.5* 12/26/2014   HCT 21.0* 12/26/2014   MCV 84.7 12/26/2014   PLT 61* 12/26/2014  Lab Results  Component Value Date   NA 129* 12/26/2014   K 3.7 12/26/2014   CL 95* 12/26/2014   CO2 28 12/26/2014   Lab Results  Component Value Date   ALT 8* 12/24/2014   AST 13* 12/24/2014   ALKPHOS 61 12/24/2014   BILITOT 1.1 12/24/2014      IMPRESSION: Metastatic small cell carcinoma along with friability to be simple medical involvement from her T9 vertebra, and perhaps from her T12 vertebra as well.  She probably has some degree of structural integrity from her vertebral compression fracture at T9.  On review of her PET scan, she had involvement of T9 and T12.  She may have glenoid involvement along bilateral shoulders.  I feel that she is a candidate for short course of pale to radiotherapy to her lower thoracic spine.  Her right shoulder discomfort is not particularly bothersome at this time and this can be observed.  I anticipate 3-5 fractions of radiation therapy.  I discussed the potential acute and late toxicities of radiation therapy which should be relatively well tolerated.  I  would not expect this radiation therapy to affect her hematologic reserve.  Consent is signed today.  She will return here this Thursday for CT simulation and begin her treatment next week.   PLAN: As discussed above.  I spent 45  minutes face to face with the patient and more than 50% of that time was spent in counseling and/or coordination of care.

## 2015-01-02 NOTE — Addendum Note (Signed)
Encounter addended by: Benn Moulder, RN on: 01/02/2015  5:51 PM<BR>     Documentation filed: BPA Follow-up Actions, Flowsheet VN, Dx Association, Orders

## 2015-01-02 NOTE — Progress Notes (Signed)
Ms. Vogan here today for assessment for  Small Cell Lung cancer.  She reports pain as a level 7 in the Right lateral/posterior ribs.  She is traveling by wheelchair today accompanied by her spouse.  Note tremors of her upper and lower extremities. She has lost  BP 95/63 mmHg  Pulse 107  Temp(Src) 98.4 F (36.9 C)  Ht '5\' 5"'$  (1.651 m)  Wt 105 lb (47.628 kg)  BMI 17.47 kg/m2  SpO2 98%   Wt Readings from Last 3 Encounters:  01/02/15 105 lb (47.628 kg)  12/23/14 113 lb 8.6 oz (51.5 kg)  12/13/14 118 lb 14.4 oz (53.933 kg)

## 2015-01-02 NOTE — Addendum Note (Signed)
Encounter addended by: Benn Moulder, RN on: 01/02/2015  5:54 PM<BR>     Documentation filed: Charges VN

## 2015-01-03 ENCOUNTER — Other Ambulatory Visit: Payer: Medicare Other

## 2015-01-03 ENCOUNTER — Telehealth: Payer: Self-pay | Admitting: Physician Assistant

## 2015-01-03 ENCOUNTER — Other Ambulatory Visit (HOSPITAL_BASED_OUTPATIENT_CLINIC_OR_DEPARTMENT_OTHER): Payer: Medicare Other

## 2015-01-03 ENCOUNTER — Ambulatory Visit (HOSPITAL_BASED_OUTPATIENT_CLINIC_OR_DEPARTMENT_OTHER): Payer: Medicare Other | Admitting: Physician Assistant

## 2015-01-03 ENCOUNTER — Ambulatory Visit (HOSPITAL_BASED_OUTPATIENT_CLINIC_OR_DEPARTMENT_OTHER): Payer: Medicare Other

## 2015-01-03 ENCOUNTER — Telehealth: Payer: Self-pay | Admitting: *Deleted

## 2015-01-03 ENCOUNTER — Encounter: Payer: Self-pay | Admitting: Physician Assistant

## 2015-01-03 ENCOUNTER — Ambulatory Visit: Payer: Medicare Other

## 2015-01-03 VITALS — BP 99/71 | HR 83 | Temp 98.4°F | Resp 18

## 2015-01-03 VITALS — BP 104/61 | HR 97 | Temp 98.1°F | Resp 18 | Ht 65.0 in | Wt 105.1 lb

## 2015-01-03 DIAGNOSIS — C7951 Secondary malignant neoplasm of bone: Secondary | ICD-10-CM

## 2015-01-03 DIAGNOSIS — C3491 Malignant neoplasm of unspecified part of right bronchus or lung: Secondary | ICD-10-CM

## 2015-01-03 DIAGNOSIS — C3490 Malignant neoplasm of unspecified part of unspecified bronchus or lung: Secondary | ICD-10-CM

## 2015-01-03 DIAGNOSIS — E876 Hypokalemia: Secondary | ICD-10-CM | POA: Diagnosis not present

## 2015-01-03 DIAGNOSIS — E860 Dehydration: Secondary | ICD-10-CM | POA: Diagnosis not present

## 2015-01-03 LAB — CBC WITH DIFFERENTIAL/PLATELET
BASO%: 0.6 % (ref 0.0–2.0)
BASOS ABS: 0 10*3/uL (ref 0.0–0.1)
EOS%: 1.1 % (ref 0.0–7.0)
Eosinophils Absolute: 0 10*3/uL (ref 0.0–0.5)
HCT: 25.8 % — ABNORMAL LOW (ref 34.8–46.6)
HEMOGLOBIN: 8.9 g/dL — AB (ref 11.6–15.9)
LYMPH%: 18.9 % (ref 14.0–49.7)
MCH: 29.9 pg (ref 25.1–34.0)
MCHC: 34.6 g/dL (ref 31.5–36.0)
MCV: 86.3 fL (ref 79.5–101.0)
MONO#: 0.3 10*3/uL (ref 0.1–0.9)
MONO%: 12 % (ref 0.0–14.0)
NEUT%: 67.4 % (ref 38.4–76.8)
NEUTROS ABS: 1.8 10*3/uL (ref 1.5–6.5)
Platelets: 30 10*3/uL — ABNORMAL LOW (ref 145–400)
RBC: 2.99 10*6/uL — ABNORMAL LOW (ref 3.70–5.45)
RDW: 15.9 % — AB (ref 11.2–14.5)
WBC: 2.7 10*3/uL — ABNORMAL LOW (ref 3.9–10.3)
lymph#: 0.5 10*3/uL — ABNORMAL LOW (ref 0.9–3.3)

## 2015-01-03 LAB — COMPREHENSIVE METABOLIC PANEL (CC13)
ALK PHOS: 75 U/L (ref 40–150)
ALT: 7 U/L (ref 0–55)
AST: 10 U/L (ref 5–34)
Albumin: 3.5 g/dL (ref 3.5–5.0)
Anion Gap: 13 mEq/L — ABNORMAL HIGH (ref 3–11)
BUN: 30 mg/dL — AB (ref 7.0–26.0)
CO2: 27 mEq/L (ref 22–29)
Calcium: 5.7 mg/dL — CL (ref 8.4–10.4)
Chloride: 96 mEq/L — ABNORMAL LOW (ref 98–109)
Creatinine: 1.2 mg/dL — ABNORMAL HIGH (ref 0.6–1.1)
EGFR: 45 mL/min/{1.73_m2} — ABNORMAL LOW (ref >90)
Glucose: 111 mg/dl (ref 70–140)
POTASSIUM: 3.5 meq/L (ref 3.5–5.1)
SODIUM: 135 meq/L — AB (ref 136–145)
Total Bilirubin: 0.8 mg/dL (ref 0.20–1.20)
Total Protein: 6.4 g/dL (ref 6.4–8.3)

## 2015-01-03 LAB — MAGNESIUM (CC13): Magnesium: 1 mg/dl — CL (ref 1.5–2.5)

## 2015-01-03 MED ORDER — SODIUM CHLORIDE 0.9 % IJ SOLN
10.0000 mL | Freq: Once | INTRAMUSCULAR | Status: AC
  Administered 2015-01-03: 10 mL via INTRAVENOUS
  Filled 2015-01-03: qty 10

## 2015-01-03 MED ORDER — SODIUM CHLORIDE 0.9 % IV SOLN
Freq: Once | INTRAVENOUS | Status: AC
  Administered 2015-01-03: 10:00:00 via INTRAVENOUS

## 2015-01-03 MED ORDER — SODIUM CHLORIDE 0.9 % IV SOLN: INTRAVENOUS | Status: DC

## 2015-01-03 MED ORDER — HEPARIN SOD (PORK) LOCK FLUSH 100 UNIT/ML IV SOLN
500.0000 [IU] | Freq: Once | INTRAVENOUS | Status: AC
  Administered 2015-01-03: 500 [IU] via INTRAVENOUS
  Filled 2015-01-03: qty 5

## 2015-01-03 MED ORDER — SODIUM CHLORIDE 0.9 % IJ SOLN
10.0000 mL | INTRAMUSCULAR | Status: DC | PRN
  Filled 2015-01-03: qty 10

## 2015-01-03 MED ORDER — SODIUM CHLORIDE 0.9 % IV SOLN
Freq: Once | INTRAVENOUS | Status: AC
Start: 2015-01-03 — End: 2015-01-03
  Administered 2015-01-03: 10:00:00 via INTRAVENOUS
  Filled 2015-01-03: qty 250

## 2015-01-03 MED ORDER — HEPARIN SOD (PORK) LOCK FLUSH 100 UNIT/ML IV SOLN
500.0000 [IU] | INTRAVENOUS | Status: DC | PRN
  Filled 2015-01-03: qty 5

## 2015-01-03 MED ORDER — SODIUM CHLORIDE 0.9 % IJ SOLN
10.0000 mL | INTRAMUSCULAR | Status: AC | PRN
  Administered 2015-01-03: 10 mL
  Filled 2015-01-03: qty 10

## 2015-01-03 MED ORDER — SODIUM CHLORIDE 0.9 % IV SOLN
Freq: Once | INTRAVENOUS | Status: AC
  Administered 2015-01-03: 13:00:00 via INTRAVENOUS
  Filled 2015-01-03: qty 1000

## 2015-01-03 NOTE — Telephone Encounter (Signed)
Per [name] she will see her and do chemo 8/23 as she does not have anything available 8/24,i have sent michelle an email to help with 8/23 chemo

## 2015-01-03 NOTE — Patient Instructions (Signed)

## 2015-01-03 NOTE — Telephone Encounter (Signed)
Per staff message and POF I have scheduled appts. Advised scheduler of appts. JMW  

## 2015-01-03 NOTE — Patient Instructions (Signed)
Dehydration, Adult Dehydration is when you lose more fluids from the body than you take in. Vital organs like the kidneys, brain, and heart cannot function without a proper amount of fluids and salt. Any loss of fluids from the body can cause dehydration.  CAUSES   Vomiting.  Diarrhea.  Excessive sweating.  Excessive urine output.  Fever. SYMPTOMS  Mild dehydration  Thirst.  Dry lips.  Slightly dry mouth. Moderate dehydration  Very dry mouth.  Sunken eyes.  Skin does not bounce back quickly when lightly pinched and released.  Dark urine and decreased urine production.  Decreased tear production.  Headache. Severe dehydration  Very dry mouth.  Extreme thirst.  Rapid, weak pulse (more than 100 beats per minute at rest).  Cold hands and feet.  Not able to sweat in spite of heat and temperature.  Rapid breathing.  Blue lips.  Confusion and lethargy.  Difficulty being awakened.  Minimal urine production.  No tears. DIAGNOSIS  Your caregiver will diagnose dehydration based on your symptoms and your exam. Blood and urine tests will help confirm the diagnosis. The diagnostic evaluation should also identify the cause of dehydration. TREATMENT  Treatment of mild or moderate dehydration can often be done at home by increasing the amount of fluids that you drink. It is best to drink small amounts of fluid more often. Drinking too much at one time can make vomiting worse. Refer to the home care instructions below. Severe dehydration needs to be treated at the hospital where you will probably be given intravenous (IV) fluids that contain water and electrolytes. HOME CARE INSTRUCTIONS   Ask your caregiver about specific rehydration instructions.  Drink enough fluids to keep your urine clear or pale yellow.  Drink small amounts frequently if you have nausea and vomiting.  Eat as you normally do.  Avoid:  Foods or drinks high in sugar.  Carbonated  drinks.  Juice.  Extremely hot or cold fluids.  Drinks with caffeine.  Fatty, greasy foods.  Alcohol.  Tobacco.  Overeating.  Gelatin desserts.  Wash your hands well to avoid spreading bacteria and viruses.  Only take over-the-counter or prescription medicines for pain, discomfort, or fever as directed by your caregiver.  Ask your caregiver if you should continue all prescribed and over-the-counter medicines.  Keep all follow-up appointments with your caregiver. SEEK MEDICAL CARE IF:  You have abdominal pain and it increases or stays in one area (localizes).  You have a rash, stiff neck, or severe headache.  You are irritable, sleepy, or difficult to awaken.  You are weak, dizzy, or extremely thirsty. SEEK IMMEDIATE MEDICAL CARE IF:   You are unable to keep fluids down or you get worse despite treatment.  You have frequent episodes of vomiting or diarrhea.  You have blood or green matter (bile) in your vomit.  You have blood in your stool or your stool looks black and tarry.  You have not urinated in 6 to 8 hours, or you have only urinated a small amount of very dark urine.  You have a fever.  You faint. MAKE SURE YOU:   Understand these instructions.  Will watch your condition.  Will get help right away if you are not doing well or get worse. Document Released: 05/05/2005 Document Revised: 07/28/2011 Document Reviewed: 12/23/2010 [employer] Patient Information 2015 [employer]. This information is not intended to replace advice given to you by your health care provider. Make sure you discuss any questions you have with your health care   provider.  

## 2015-01-03 NOTE — Progress Notes (Addendum)
[place] [phone]   [phone]  OFFICE PROGRESS NOTE  [name], MD [address] [place] [address]  DIAGNOSIS: Extensive stage, stage IV (T3, N2, M1b) small cell lung cancer diagnosed in October 2015.  PRIOR THERAPY: Systemic chemotherapy with carboplatin for AUC of 5 on day 1 and etoposide 100 MG/M2 on days 1, 2 and 3 with Neulasta support on day 4. Status post 6 cycles. Starting from cycle #5 carboplatin with for AUC of 4 and etoposide 80 MG/M2  CURRENT THERAPY: The patient restarted systemic chemotherapy with cisplatin 25 mg meter squared and irinotecan 65 mg meter squared given day 1 and day 8 of each cycle. She started this on 10/25/2014. This was reduced to cisplatin 20 MG/M2 and irinotecan '45MG'$ /M2 starting with the beginning of cycle #2. She is status post 3 cycles.  INTERVAL HISTORY: [name] 74 y.o. female returns to the clinic today for follow-up visit accompanied by her husband. The interval history is remarkable for a recent hospitalization for dehydration secondary to poor by mouth intake and diarrhea. She was discharged approximately 1 week ago and still complains of  fatigue and weakness. She does not feel strong enough to proceed with chemotherapy as scheduled today. She has lost a significant amount of weight. Her appetite was down and she experienced her usual diarrhea that leads to dehydration. She has occasional nausea that she manages with compazine. She has occasional back pain and she manages it with narcotics. She denies fevers or chills. She has shortness of breath with exertion, but denies chest pain, cough, or palpitations.   MEDICAL HISTORY: Past Medical History  Diagnosis Date  . GERD (gastroesophageal reflux disease)   . IBS (irritable bowel syndrome)   . Adenomatous colon polyp 06/2009  . Hiatal hernia   . Breast cyst   . Diverticulosis   . Hemorrhoids   . Hyperlipidemia   . Hypertension   .  Hyperparathyroidism   . Anxiety   . Migraine   . Osteoporosis   . Vitamin D deficiency   . Spinal stenosis   . C. difficile colitis 2006  . Small cell lung carcinoma dx'd 02/2014  . Bone metastases dx'd 02/2014    ALLERGIES:  is allergic to ace inhibitors; cephalexin; etodolac; and gabapentin.  MEDICATIONS:  Current Outpatient Prescriptions  Medication Sig Dispense Refill  . atorvastatin (LIPITOR) 80 MG tablet Take 80 mg by mouth every morning.     Marland Kitchen buPROPion (WELLBUTRIN SR) 150 MG 12 hr tablet Take 150 mg by mouth every morning.     Marland Kitchen CALCIUM PO Take 1 tablet by mouth 3 (three) times daily. Starting 06/21/14--taking '600mg'$  three times daily    . carbidopa-levodopa (SINEMET IR) 25-100 MG per tablet Take 1 tablet by mouth 3 (three) times daily. 270 tablet 1  . DEXILANT 60 MG capsule TAKE ONE CAPSULE BY MOUTH DAILY 30 capsule 0  . diphenoxylate-atropine (LOMOTIL) 2.5-0.025 MG per tablet Take 2 tablets by mouth 4 (four) times daily as needed for diarrhea or loose stools. 30 tablet 1  . feeding supplement, ENSURE COMPLETE, (ENSURE COMPLETE) LIQD Take 237 mLs by mouth 2 (two) times daily between meals. (Patient not taking: Reported on 01/02/2015) 237 mL 0  . glycopyrrolate (ROBINUL) 2 MG tablet TAKE 1 TABLET (2 MG TOTAL) BY MOUTH 2 (TWO) TIMES DAILY. 60 tablet 0  . lidocaine-prilocaine (EMLA) cream Apply 1 application topically as needed. Apply to port 1 hr before chemo 30 g 0  .  loperamide (IMODIUM A-D) 2 MG tablet Take 2 mg by mouth 4 (four) times daily as needed for diarrhea or loose stools.    . magnesium oxide (MAG-OX) 400 (241.3 MG) MG tablet Take 1 tablet (400 mg total) by mouth daily. 30 tablet 0  . metoprolol (TOPROL-XL) 50 MG 24 hr tablet Take 50 mg by mouth every morning.     . ondansetron (ZOFRAN) 8 MG tablet Take 1 tablet (8 mg total) by mouth every 8 (eight) hours as needed for nausea or vomiting. 20 tablet 0  . oxyCODONE-acetaminophen (PERCOCET/ROXICET) 5-325 MG per tablet Take 1  tablet by mouth every 6 (six) hours as needed for severe pain. 30 tablet 0  . Polyethylene Glycol 3350 (MIRALAX PO) Take 1 scoop by mouth daily as needed (for constipation).     . potassium chloride SA (K-DUR,KLOR-CON) 20 MEQ tablet Take 20 mEq by mouth 2 (two) times daily.    . prochlorperazine (COMPAZINE) 10 MG tablet Take 1 tablet (10 mg total) by mouth every 6 (six) hours as needed for nausea or vomiting. 30 tablet 1   No current facility-administered medications for this visit.   Facility-Administered Medications Ordered in Other Visits  Medication Dose Route Frequency Provider Last Rate Last Dose  . heparin lock flush 100 unit/mL  500 Units Intracatheter PRN Curt Bears, MD      . heparin lock flush 100 unit/mL  500 Units Intravenous Once Curt Bears, MD      . heparin lock flush 100 unit/mL  500 Units Intracatheter PRN Curt Bears, MD      . sodium chloride 0.9 % injection 10 mL  10 mL Intravenous Once Curt Bears, MD      . sodium chloride 0.9 % injection 10 mL  10 mL Intracatheter PRN Curt Bears, MD        SURGICAL HISTORY:  Past Surgical History  Procedure Laterality Date  . Cholecystectomy    . Vaginal hysterectomy    . Knee arthroscopy      left  . Parathyroidectomy  2009    [facility]  . Lumbar spine surgery  2001  . Breast biopsy      bilateral  . Total knee arthroplasty      right  . Bladder repair    . Video bronchoscopy Bilateral 03/14/2014    Procedure: VIDEO BRONCHOSCOPY WITHOUT FLUORO;  Surgeon: Tanda Rockers, MD;  Location: [facility];  Service: Cardiopulmonary;  Laterality: Bilateral;    REVIEW OF SYSTEMS:  Constitutional: positive for anorexia, fatigue and weight loss Eyes: negative Ears, nose, mouth, throat, and face: negative Respiratory: positive for dyspnea on exertion Cardiovascular: negative Gastrointestinal: positive for diarrhea and nausea Genitourinary:negative Integument/breast: negative Hematologic/lymphatic:  negative Musculoskeletal:positive for back pain and muscle weakness Neurological: positive for tremors Behavioral/Psych: negative Endocrine: negative Allergic/Immunologic: negative   PHYSICAL EXAMINATION: General appearance: alert, cooperative, appears older than stated age, cachectic, fatigued and no distress Head: Normocephalic, without obvious abnormality, atraumatic Neck: no adenopathy, no JVD, supple, symmetrical, trachea midline and thyroid not enlarged, symmetric, no tenderness/mass/nodules Lymph nodes: Cervical, supraclavicular, and axillary nodes normal. Resp: clear to auscultation bilaterally Back: symmetric, no curvature. ROM normal. No CVA tenderness. Cardio: regular rate and rhythm, S1, S2 normal, no murmur, click, rub or gallop GI: soft, non-tender; bowel sounds normal; no masses,  no organomegaly Extremities: extremities normal, atraumatic, no cyanosis or edema Neurologic: Grossly normal  ECOG PERFORMANCE STATUS: 2 - Symptomatic, <50% confined to bed  Blood pressure 104/61, pulse 97, temperature 98.1 F (  36.7 C), temperature source Oral, resp. rate 18, height '5\' 5"'$  (1.651 m), weight 105 lb 1.6 oz (47.673 kg), SpO2 99 %.  LABORATORY DATA: Lab Results  Component Value Date   WBC 2.7* 01/03/2015   HGB 8.9* 01/03/2015   HCT 25.8* 01/03/2015   MCV 86.3 01/03/2015   PLT 30* 01/03/2015      Chemistry      Component Value Date/Time   NA 135* 01/03/2015 0810   NA 129* 12/26/2014 0450   K 3.5 01/03/2015 0810   K 3.7 12/26/2014 0450   CL 95* 12/26/2014 0450   CO2 27 01/03/2015 0810   CO2 28 12/26/2014 0450   BUN 30.0* 01/03/2015 0810   BUN 6 12/26/2014 0450   CREATININE 1.2* 01/03/2015 0810   CREATININE 0.69 12/26/2014 0450      Component Value Date/Time   CALCIUM 5.7* 01/03/2015 0810   CALCIUM 8.0* 12/26/2014 0450   ALKPHOS 75 01/03/2015 0810   ALKPHOS 61 12/24/2014 0423   AST 10 01/03/2015 0810   AST 13* 12/24/2014 0423   ALT 7 01/03/2015 0810   ALT 8*  12/24/2014 0423   BILITOT 0.80 01/03/2015 0810   BILITOT 1.1 12/24/2014 0423       RADIOGRAPHIC STUDIES: Ct Chest W Contrast  12/11/2014   CLINICAL DATA:  Subsequent encounter for small-cell lung cancer.  EXAM: CT CHEST, ABDOMEN, AND PELVIS WITH CONTRAST  TECHNIQUE: Multidetector CT imaging of the chest, abdomen and pelvis was performed following the standard protocol during bolus administration of intravenous contrast.  CONTRAST:  183m OMNIPAQUE IOHEXOL 300 MG/ML  SOLN  COMPARISON:  10/13/2014.  FINDINGS: CT CHEST FINDINGS  Mediastinum/Nodes: Right-sided Port-A-Cath tip is positioned at the junction of the SVC and RA. There is no axillary lymphadenopathy. 15 mm precarinal lymph node measured on the previous study is now 13 mm in short axis. 21 mm right hilar lymph node measured previously is canal 18 mm in the same dimension and measures 12 mm in short axis. 16 mm short axis subcarinal lymph node measured on the previous study is now 10 mm in short axis. No left hilar lymphadenopathy. Heart size is normal. No pericardial effusion.  The 11 x 18 mm paraspinal nodule at the T11 level now measures 16 x 11 mm. A more ill-defined right paraspinal plaque-like lesion (image 42 series 2) measures 8 x 30 mm today compared to 12 x 31 mm previously.  Lungs/Pleura: Emphysematous changes noted in the lungs bilaterally. Tree in bud nodularity seen in the posterior right upper lobe previously is again noted but appears slightly decreased in the interval. The dominant 7 mm nodule seen in this region on the prior study is now 5 mm. 3 mm nodule along the anterior aspect of the right major fissure was 4-5 mm previously. Previously described subpleural nodule within the right lower lobe (image 31 series 4 today) is not substantially changed in the interval measuring 6 x 19 mm compared to 6 x 20 mm previously. This is in the region of the prominent bony spur and pathologic vertebral collapse. Other scattered smaller pleural  nodules are seen along the right major fissure. No suspicious nodule or mass in the left lung. There is no pulmonary edema pleural effusion. No focal airspace consolidation.  Musculoskeletal: Multiple sclerotic lesions are seen throughout the thoracic spine. Compression deformity of the T9 vertebral body is stable and likely pathologic in nature.  CT ABDOMEN AND PELVIS FINDINGS  Hepatobiliary: Stable mild prominence of the intrahepatic biliary ducts. Tiny  6 mm hypo attenuating lesion in the posterior right liver (image 52 series 2) is stable in the interval. No other focal liver lesion is evident. Gallbladder is surgically absent. No dilatation of the extrahepatic bile ducts.  Pancreas: No focal mass lesion. No dilatation of the main duct. No intraparenchymal cyst. No peripancreatic edema.  Spleen: No splenomegaly. No focal mass lesion.  Adrenals/Urinary Tract: Stable 11 mm right adrenal nodule. No discrete left adrenal nodule. Areas of cortical scarring are seen in the kidneys bilaterally. 2.6 cm cyst identified at the lower pole the right kidney. 3.7 cm cyst identified in the lower pole of the left kidney. Other smaller cysts are noted in the kidneys bilaterally.  Stomach/Bowel: Stomach is nondistended. No gastric wall thickening. No evidence of outlet obstruction. A very large duodenum diverticulum is evident. No small bowel wall thickening. No small bowel dilatation. Terminal ileum is normal. The appendix is normal. Prominent diffuse stool volume raises the question of, but is not diagnostic for constipation. There is left colonic diverticulosis without diverticulitis.  Vascular/Lymphatic: There is abdominal aortic atherosclerosis without aneurysm. Infrarenal abdominal aorta measures up to 2.9 cm in diameter which is upper normal. No gastrohepatic or hepatoduodenal ligament lymphadenopathy. The portal vein is patent. No retroperitoneal lymphadenopathy. No pelvic sidewall lymphadenopathy.  Reproductive: Uterus is  surgically absent.  No adnexal mass.  Other: No intraperitoneal free fluid.  Musculoskeletal: Numerous sclerotic lesions are seen involving bony anatomy of the pelvis. These appear relatively stable in the interval with no definite new lesion or progressive lesion evident.  IMPRESSION: 1. Interval decrease in mediastinal lymphadenopathy. Small pleural-based and paraspinal enhancing nodules are also decreased. 2. Multiple pulmonary nodules again identified, as before. The show slight interval decrease in size. No new or progressive pulmonary nodule is evident. 3. Stable 11 mm right adrenal nodule. 4. No new or progressive disease in the abdomen or pelvis to suggest metastatic progression. 5. Numerous sclerotic bony metastases with stable compression deformity of the T9 vertebral body, likely pathologic. Bony metastatic involvement does not appear progressed in the interval.   Electronically Signed   By: Misty Stanley M.D.   On: 12/11/2014 16:00   Ct Abdomen Pelvis W Contrast  12/11/2014   CLINICAL DATA:  Subsequent encounter for small-cell lung cancer.  EXAM: CT CHEST, ABDOMEN, AND PELVIS WITH CONTRAST  TECHNIQUE: Multidetector CT imaging of the chest, abdomen and pelvis was performed following the standard protocol during bolus administration of intravenous contrast.  CONTRAST:  124m OMNIPAQUE IOHEXOL 300 MG/ML  SOLN  COMPARISON:  10/13/2014.  FINDINGS: CT CHEST FINDINGS  Mediastinum/Nodes: Right-sided Port-A-Cath tip is positioned at the junction of the SVC and RA. There is no axillary lymphadenopathy. 15 mm precarinal lymph node measured on the previous study is now 13 mm in short axis. 21 mm right hilar lymph node measured previously is canal 18 mm in the same dimension and measures 12 mm in short axis. 16 mm short axis subcarinal lymph node measured on the previous study is now 10 mm in short axis. No left hilar lymphadenopathy. Heart size is normal. No pericardial effusion.  The 11 x 18 mm paraspinal nodule  at the T11 level now measures 16 x 11 mm. A more ill-defined right paraspinal plaque-like lesion (image 42 series 2) measures 8 x 30 mm today compared to 12 x 31 mm previously.  Lungs/Pleura: Emphysematous changes noted in the lungs bilaterally. Tree in bud nodularity seen in the posterior right upper lobe previously is again noted but appears slightly  decreased in the interval. The dominant 7 mm nodule seen in this region on the prior study is now 5 mm. 3 mm nodule along the anterior aspect of the right major fissure was 4-5 mm previously. Previously described subpleural nodule within the right lower lobe (image 31 series 4 today) is not substantially changed in the interval measuring 6 x 19 mm compared to 6 x 20 mm previously. This is in the region of the prominent bony spur and pathologic vertebral collapse. Other scattered smaller pleural nodules are seen along the right major fissure. No suspicious nodule or mass in the left lung. There is no pulmonary edema pleural effusion. No focal airspace consolidation.  Musculoskeletal: Multiple sclerotic lesions are seen throughout the thoracic spine. Compression deformity of the T9 vertebral body is stable and likely pathologic in nature.  CT ABDOMEN AND PELVIS FINDINGS  Hepatobiliary: Stable mild prominence of the intrahepatic biliary ducts. Tiny 6 mm hypo attenuating lesion in the posterior right liver (image 52 series 2) is stable in the interval. No other focal liver lesion is evident. Gallbladder is surgically absent. No dilatation of the extrahepatic bile ducts.  Pancreas: No focal mass lesion. No dilatation of the main duct. No intraparenchymal cyst. No peripancreatic edema.  Spleen: No splenomegaly. No focal mass lesion.  Adrenals/Urinary Tract: Stable 11 mm right adrenal nodule. No discrete left adrenal nodule. Areas of cortical scarring are seen in the kidneys bilaterally. 2.6 cm cyst identified at the lower pole the right kidney. 3.7 cm cyst identified in the  lower pole of the left kidney. Other smaller cysts are noted in the kidneys bilaterally.  Stomach/Bowel: Stomach is nondistended. No gastric wall thickening. No evidence of outlet obstruction. A very large duodenum diverticulum is evident. No small bowel wall thickening. No small bowel dilatation. Terminal ileum is normal. The appendix is normal. Prominent diffuse stool volume raises the question of, but is not diagnostic for constipation. There is left colonic diverticulosis without diverticulitis.  Vascular/Lymphatic: There is abdominal aortic atherosclerosis without aneurysm. Infrarenal abdominal aorta measures up to 2.9 cm in diameter which is upper normal. No gastrohepatic or hepatoduodenal ligament lymphadenopathy. The portal vein is patent. No retroperitoneal lymphadenopathy. No pelvic sidewall lymphadenopathy.  Reproductive: Uterus is surgically absent.  No adnexal mass.  Other: No intraperitoneal free fluid.  Musculoskeletal: Numerous sclerotic lesions are seen involving bony anatomy of the pelvis. These appear relatively stable in the interval with no definite new lesion or progressive lesion evident.  IMPRESSION: 1. Interval decrease in mediastinal lymphadenopathy. Small pleural-based and paraspinal enhancing nodules are also decreased. 2. Multiple pulmonary nodules again identified, as before. The show slight interval decrease in size. No new or progressive pulmonary nodule is evident. 3. Stable 11 mm right adrenal nodule. 4. No new or progressive disease in the abdomen or pelvis to suggest metastatic progression. 5. Numerous sclerotic bony metastases with stable compression deformity of the T9 vertebral body, likely pathologic. Bony metastatic involvement does not appear progressed in the interval.   Electronically Signed   By: Misty Stanley M.D.   On: 12/11/2014 16:00   US Renal  12/26/2014   CLINICAL DATA:  Patient currently on chemotherapy for stage IV small-cell lung cancer, now with acute urinary  retention which began yesterday.  EXAM: RENAL / URINARY TRACT ULTRASOUND COMPLETE  COMPARISON:  No prior ultrasound. CT abdomen and pelvis 10/13/2014 and earlier.  FINDINGS: Right Kidney:  Length: Approximately 11.0 cm. Approximate 3.1 x 2.6 x 2.7 cm simple cyst arising from the lower  pole as noted on prior CTs. No evidence of solid parenchymal mass. No hydronephrosis. Mild cortical thinning expected for age. Mildly echogenic parenchyma.  Left Kidney:  Length: Approximately 11.2 cm. Approximate 4.2 x 4.1 x 3.7 cm simple cyst arising from the lower pole and approximate 2.1 x 2.0 x 2.2 cm cyst with internal echoes arising from the mid pole as noted on prior CTs. No evidence of solid parenchymal mass. No hydronephrosis. Small extrarenal pelvis as noted on prior CTs. Mild cortical thinning expected for age. Mildly echogenic parenchyma.  Bladder:  Decompressed by Foley catheter.  IMPRESSION: 1. No evidence of hydronephrosis involving either kidney. 2. Bilateral renal cysts as noted on prior CTs. 3. Mild cortical thinning consistent with age. Mildly echogenic renal parenchyma bilaterally may indicate early medical renal disease.   Electronically Signed   By: Evangeline Dakin M.D.   On: 12/26/2014 10:16   Dg Abd Acute W/chest  12/23/2014   CLINICAL DATA:  Small cell carcinoma of the lung with osseous metastases, received chemotherapy 3 days ago, diarrhea since, unable to eat or drink, RIGHT lower abdominal pain  EXAM: DG ABDOMEN ACUTE W/ 1V CHEST  COMPARISON:  Chest radiograph 02/24/2014; CT chest abdomen pelvis 12/11/2014  FINDINGS: RIGHT jugular Port-A-Cath with tip projecting over SVC.  Normal heart size, mediastinal contours and pulmonary vascularity.  Known mediastinal and RIGHT hilar adenopathy not well visualized radiographically.  Atherosclerotic calcifications aorta.  Lungs emphysematous with mild central peribronchial thickening.  No infiltrate, pleural effusion or pneumothorax.  Normal bowel gas pattern.  No  bowel dilatation or bowel wall thickening, or free intraperitoneal air.  Prior L4-S1 fusion.  Degenerative disc disease changes thoracic spine.  No definite urinary tract calcification.  Diffuse osseous metastases.  IMPRESSION: No acute abnormalities.  Emphysematous changes.  Osseous metastatic disease.   Electronically Signed   By: Lavonia Dana M.D.   On: 12/23/2014 16:21    ASSESSMENT AND PLAN: This is a very pleasant 74 year old white female with:  1) Extensive stage small cell lung cancer currently undergoing systemic chemotherapy with carboplatin and etoposide status post 6 cycles. She had good response to the first line treatment and she had been on observation for the last 2 months. The recent CT scan of the chest, abdomen and pelvis showed interval progression of the metastatic disease with bulky mediastinal lymph nodes as well as multiple new pulmonary nodules and new paraspinal soft tissue masses as well as extensive bone metastasis. The patient is now on cisplatin 25 MG/M2 and irinotecan 65 MG/M2 on days 1 and 8 every 3 weeks. This were reduced to cisplatin 20 MG/M2 and irinotecan 45 MG/M2 due to poor tolerance starting with the start of cycle 2. The patient is status post 3 cycles.   The patient was discussed with and also seen by Dr. Julien Nordmann. He reviewed the results of her CT of the chest abdomen and pelvis, which showed a decrease in the size of her pulmonary nodules and lymph nodes. She is clearly weakened from her recent hospitalization and is not up to chemotherapy today. Dr. Julien Nordmann and I spoke with the patient and her family about postponing chemotherapy for at least a week and considering a referral to Hospice and Palliative Care as she is unable to tolerate chemotherapy and performance status continues to decline.  2) metastatic bone disease: The patient will continue on Xgeva 120 mcg subcutaneously every 4 weeks. The patient has no dental issues and currently has dentures. She was  also advised  to take calcium and vitamin D supplements on daily basis.  3) chemotherapy-induced anemia, stable. We'll continue to monitor for now  4) tremors,  She is also followed by neurology. She will continue on Sinemet IR.  5) right neck and back pain: She is on Percocet 5/325 mg by mouth every 6 hours as needed.    6) hypokalemia: supplemented with 20 meq potassium daily  She will receive IV fluids today as well as magnesium and calcium to correct these deficits. She and her family will discuss trying to continue with chemotherapy versus comfort care with Hospice/Palliative Care. They will inform us of their decision.  The patient was advised to call immediately if she has any concerning symptoms in the interval. The patient voices understanding of current disease status and treatment options and is in agreement with the current care plan.  All questions were answered. The patient knows to call the clinic with any problems, questions or concerns. We can certainly see the patient much sooner if necessary.   Carlton Adam, PA-C  01/03/2015  ADDENDUM: Hematology/Oncology Attending: I had a face to face encounter with the patient. I recommended her care plan. This is a very pleasant 74 years old white female with extensive stage small cell lung cancer diagnosed in October 2015 status post treatment with carboplatin and etoposide as well as second line systemic chemotherapy with cisplatin and irinotecan. The patient has rough time tolerating her systemic chemotherapy. She continues to have back pain secondary to metastatic bone disease. I had a lengthy discussion with the patient and her husband today about her current condition and treatment options. I strongly recommended for the patient to consider palliative care and hospice referral at this point especially with the poor tolerance to her systemic therapy and significant complications from the treatment. For the back pain she would  see Dr. Valere Dross for consideration of palliative radiotherapy to the metastatic bone disease. The patient and her husband agreed to the current plan. She was advised to call if she has any concerning symptoms.  Disclaimer: This note was dictated with voice recognition software. Similar sounding words can inadvertently be transcribed and may be missed upon review. Eilleen Kempf., MD 01/09/2015

## 2015-01-05 ENCOUNTER — Ambulatory Visit (INDEPENDENT_AMBULATORY_CARE_PROVIDER_SITE_OTHER): Payer: Medicare Other | Admitting: Neurology

## 2015-01-05 ENCOUNTER — Encounter: Payer: Self-pay | Admitting: Neurology

## 2015-01-05 VITALS — BP 90/60 | HR 85

## 2015-01-05 DIAGNOSIS — C3490 Malignant neoplasm of unspecified part of unspecified bronchus or lung: Secondary | ICD-10-CM

## 2015-01-05 DIAGNOSIS — G20 Parkinson's disease: Secondary | ICD-10-CM | POA: Diagnosis not present

## 2015-01-05 NOTE — Progress Notes (Signed)
Subjective:   [name] was seen in consultation in the movement disorder clinic at the request of Drue Second.  Her PCP is  [name], MD.  The evaluation is for tremor.  The records that were made available to me were reviewed.  Pt is accompanied by husband and daughter who supplement the history.    The patient is a 74 y.o. right handed female with a history of tremor.  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  Tremor was minor 5 years ago in the hands and was mostly in the R hand and R leg.  Tremor 5 years ago was at rest and with use but was fairly minor.  It wasn't until until chemo started that they noted that she had tremor in the L leg.  She doesn't walk well but they attribute that to chemotherapy as well.  She had recent fall.  There have been changes in the strength of the voice but again her husband states that is fairly new.  Her walk is now slow and wobbly.  She needs help to get up and down out of the chair.  She denies any significant stiffness.  She has trouble getting to sleep and staying asleep.  No vivid dreams.  No sleep talking/acting out the dreams.  Tremor goes away only when asleep.  She was admitted on 05/13/14  For severe pancytopenia and neutropenia and required 2 units of PRBCs.  She is on ativan q 8 hours and it isn't helping a lot and is affecting to think some.  Her sister had tremor but it was attributed to a stroke.  She has no trouble swallowing.   She denies trouble with ADL's including buttoning up buttons.    08/01/14 update:  Pt presents for f/u.  Accompanied by daughter and niece who supplement the hx.  She has a new dx of PD and was started on carbidopa/levodopa 25/100 tid last visit.  She doesn't think that it has helped.  Certainly hasn't helped the tremor.  Takes it with the meals, but avoids taking it with ensure.   She continues to have  significant difficulties with pancytopenia from small cell/chemo and has had further blood transfusions since last visit.    01/05/15 update:  The patient presents today for follow-up.  She is accompanied by her husband who supplements the history.  Records were reviewed since last visit.  The patient is on carbidopa/levodopa 25/100, 3 times per day.  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  Because the tremor has been so significant and disabling, we tried her on Neupro last visit.  She discontinued it after about 2 weeks as she felt that she was having numbness in the feet and legs and itching.  We ended up changing her to Requip on 08/23/2014 and working up to 1 mg 3 times a day.  This definitely helped the tremor.  She is still on it although she didn't take it today.  She has had a CT of the brain with and without contrast since last visit as they were restaging her cancer.  This was done on 10/13/2014.  There was evidence of osseous metastases to the skull and skull base/clivus, but intracranially there were no metastases.  She had an appointment in June, but had to cancel that because of diarrhea.  She has had significant and virtually constant diarrhea ever since as a result of chemotherapy.  She has had to have IV fluids and transfusions.  She was admitted in early August for diarrhea.  She did not have C-diff.  She was orthostatic on admission.  She has been neutropenic and pancytopenic.  Overall, she reports that she just has not felt well.   She states that they have talked to her about hospice and while she feels "okay" about at her family wants her to get another opinion.  Current/Previously tried tremor medications: ativan  Current medications that may exacerbate tremor:  n/a   Outside reports reviewed: historical medical records and lab reports.  Allergies  Allergen Reactions  . Ace Inhibitors     REACTION: causing cough  . Cephalexin Rash    . Etodolac Rash  . Gabapentin Rash    Outpatient Encounter Prescriptions as of 01/05/2015  Medication Sig  . atorvastatin (LIPITOR) 80 MG tablet Take 80 mg by mouth every morning.   Marland Kitchen buPROPion (WELLBUTRIN SR) 150 MG 12 hr tablet Take 150 mg by mouth every morning.   Marland Kitchen CALCIUM PO Take 1 tablet by mouth 3 (three) times daily. Starting 06/21/14--taking '600mg'$  three times daily  . carbidopa-levodopa (SINEMET IR) 25-100 MG per tablet Take 1 tablet by mouth 3 (three) times daily.  Marland Kitchen DEXILANT 60 MG capsule TAKE ONE CAPSULE BY MOUTH DAILY  . diphenoxylate-atropine (LOMOTIL) 2.5-0.025 MG per tablet Take 2 tablets by mouth 4 (four) times daily as needed for diarrhea or loose stools.  Marland Kitchen glycopyrrolate (ROBINUL) 2 MG tablet TAKE 1 TABLET (2 MG TOTAL) BY MOUTH 2 (TWO) TIMES DAILY.  Marland Kitchen lidocaine-prilocaine (EMLA) cream Apply 1 application topically as needed. Apply to port 1 hr before chemo  . loperamide (IMODIUM A-D) 2 MG tablet Take 2 mg by mouth 4 (four) times daily as needed for diarrhea or loose stools.  . magnesium oxide (MAG-OX) 400 (241.3 MG) MG tablet Take 1 tablet (400 mg total) by mouth daily.  . metoprolol (TOPROL-XL) 50 MG 24 hr tablet Take 50 mg by mouth every morning.   . ondansetron (ZOFRAN) 8 MG tablet Take 1 tablet (8 mg total) by mouth every 8 (eight) hours as needed for nausea or vomiting.  . potassium chloride SA (K-DUR,KLOR-CON) 20 MEQ tablet Take 20 mEq by mouth 2 (two) times daily.  . prochlorperazine (COMPAZINE) 10 MG tablet Take 1 tablet (10 mg total) by mouth every 6 (six) hours as needed for nausea or vomiting.  . feeding supplement, ENSURE COMPLETE, (ENSURE COMPLETE) LIQD Take 237 mLs by mouth 2 (two) times daily between meals. (Patient not taking: Reported on 01/02/2015)  . oxyCODONE-acetaminophen (PERCOCET/ROXICET) 5-325 MG per tablet Take 1 tablet by mouth every 6 (six) hours as needed for severe pain. (Patient not taking: Reported on 01/05/2015)  . [DISCONTINUED] Polyethylene  Glycol 3350 (MIRALAX PO) Take 1 scoop by mouth daily as needed (for constipation).    No facility-administered encounter medications on file as of 01/05/2015.    Past Medical History  Diagnosis Date  . GERD (gastroesophageal reflux disease)   . IBS (irritable bowel syndrome)   . Adenomatous colon polyp 06/2009  . Hiatal hernia   . Breast cyst   . Diverticulosis   . Hemorrhoids   . Hyperlipidemia   . Hypertension   . Hyperparathyroidism   . Anxiety   . Migraine   . Osteoporosis   . Vitamin D deficiency   . Spinal stenosis   . C.  difficile colitis 2006  . Small cell lung carcinoma dx'd 02/2014  . Bone metastases dx'd 02/2014    Past Surgical History  Procedure Laterality Date  . Cholecystectomy    . Vaginal hysterectomy    . Knee arthroscopy      left  . Parathyroidectomy  2009    [facility]  . Lumbar spine surgery  2001  . Breast biopsy      bilateral  . Total knee arthroplasty      right  . Bladder repair    . Video bronchoscopy Bilateral 03/14/2014    Procedure: VIDEO BRONCHOSCOPY WITHOUT FLUORO;  Surgeon: Tanda Rockers, MD;  Location: [facility];  Service: Cardiopulmonary;  Laterality: Bilateral;    Social History   Social History  . Marital Status: Married    Spouse Name: N/A  . Number of Children: N/A  . Years of Education: N/A   Occupational History  . Not on file.   Social History Main Topics  . Smoking status: Former Smoker -- 0.25 packs/day for 25 years    Types: Cigarettes    Quit date: 05/19/2013  . Smokeless tobacco: Never Used  . Alcohol Use: No  . Drug Use: No  . Sexual Activity: Not on file   Other Topics Concern  . Not on file   Social History Narrative   Lives with husband.   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    Family Status  Relation Status Death Age  . Mother Deceased     murdered  . Father Deceased     murdered  . Sister Alive     DM, Breast Cancer  . Sister Alive     DM, stroke  . Sister  Alive     DM  . Brother Alive     DM  . Brother Deceased     MI, DM  . Brother Deceased     unknown  . Daughter Alive     MS  . Brother Alive     healthy    Review of Systems A complete 10 system ROS was obtained and was negative apart from what is mentioned.   Objective:   VITALS:   Filed Vitals:   01/05/15 1013  BP: 90/60  Pulse: 85   Gen:  Appears stated age and in NAD.  Ill appearing.   HEENT:  Normocephalic, atraumatic. The mucous membranes are moist. The superficial temporal arteries are without ropiness or tenderness. Cardiovascular: Regular rate and rhythm. Lungs: Clear to auscultation bilaterally. Neck: There are no carotid bruits noted bilaterally.  NEUROLOGICAL:  Orientation: The patient is alert and oriented x3.  Cranial nerves: There is good facial symmetry. . The speech is fluent and clear. Soft palate rises symmetrically and there is no tongue deviation. Hearing is intact to conversational tone. Sensation: Sensation is intact to light touch throughout. Motor: Strength is 4/5 in the bilateral upper and lower extremities.   Shoulder shrug is equal and symmetric.  There is no pronator drift.   Movement examination: Tone: There is normal tone throughout today. Abnormal movements: There is no tremor noted today, which is a dramatic improvement. Coordination:  There is good finger taps and good hand opening and closing. Gait and Station: Not tested today, as the patient looked very weak and felt quite weak     Assessment/Plan:   1.  Parkinsons disease  -sounds like she has had sx's for 5 years (RUE/RLE resting tremor) but that sx's dramatically increased in  November when started chemotherapy.  They noted great increase in tremor then, but she does meet clinical criteria for dx of PD.    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  She is doing much better on Requip, 1 mg 3 times per day.  I will leave her on this medication.  She is contemplating  hospice.  I told her that hospice would pay for these medications if she decided to go that route.  - Avoid protein at same time as levodopa. 2.  Stage 4 small cell  -still undergoing chemo which is obviously contributing to weakness  -had severe neutropenia and pancytopenia. Contemplating hospice 3.  Follow up in 6 months, call if issues before then.

## 2015-01-08 ENCOUNTER — Ambulatory Visit
Admission: RE | Admit: 2015-01-08 | Discharge: 2015-01-08 | Disposition: A | Discharge status: Home / Self Care | Payer: Medicare Other | Source: Ambulatory Visit | Attending: Radiation Oncology | Admitting: Radiation Oncology

## 2015-01-08 DIAGNOSIS — C801 Malignant (primary) neoplasm, unspecified: Secondary | ICD-10-CM | POA: Diagnosis not present

## 2015-01-08 DIAGNOSIS — C7951 Secondary malignant neoplasm of bone: Secondary | ICD-10-CM

## 2015-01-08 NOTE — Patient Instructions (Signed)
We are postponing your chemotherapy for one week. In the interim, you and your family will consider Hospice Care

## 2015-01-08 NOTE — Progress Notes (Signed)
Complex simulation/treatment planning note: The patient was taken to the CT simulator.  She was placed supine.  Her thoracic/lumbar spine was scanned.  I chose an isocenter at approximately T12.  The CT data set was sent to the planning system where she had contouring of her normal anatomy including her spinal cord, kidneys, esophagus, and heart.  She was set up to AP and PA fields with 2 unique MLCs.  I'm prescribing 2500 cGy in 10 sessions.

## 2015-01-09 ENCOUNTER — Other Ambulatory Visit (HOSPITAL_BASED_OUTPATIENT_CLINIC_OR_DEPARTMENT_OTHER): Payer: Medicare Other

## 2015-01-09 ENCOUNTER — Ambulatory Visit (HOSPITAL_BASED_OUTPATIENT_CLINIC_OR_DEPARTMENT_OTHER): Payer: Medicare Other | Admitting: Physician Assistant

## 2015-01-09 ENCOUNTER — Encounter: Payer: Self-pay | Admitting: Radiation Oncology

## 2015-01-09 ENCOUNTER — Ambulatory Visit (HOSPITAL_BASED_OUTPATIENT_CLINIC_OR_DEPARTMENT_OTHER): Payer: Medicare Other

## 2015-01-09 ENCOUNTER — Ambulatory Visit: Payer: Medicare Other

## 2015-01-09 DIAGNOSIS — C7951 Secondary malignant neoplasm of bone: Secondary | ICD-10-CM | POA: Diagnosis not present

## 2015-01-09 DIAGNOSIS — C3491 Malignant neoplasm of unspecified part of right bronchus or lung: Secondary | ICD-10-CM

## 2015-01-09 DIAGNOSIS — C3490 Malignant neoplasm of unspecified part of unspecified bronchus or lung: Secondary | ICD-10-CM

## 2015-01-09 DIAGNOSIS — Z95828 Presence of other vascular implants and grafts: Secondary | ICD-10-CM

## 2015-01-09 DIAGNOSIS — C801 Malignant (primary) neoplasm, unspecified: Secondary | ICD-10-CM | POA: Diagnosis not present

## 2015-01-09 LAB — COMPREHENSIVE METABOLIC PANEL (CC13)
ALBUMIN: 3.3 g/dL — AB (ref 3.5–5.0)
ALK PHOS: 81 U/L (ref 40–150)
ALT: 6 U/L (ref 0–55)
AST: 10 U/L (ref 5–34)
Anion Gap: 11 mEq/L (ref 3–11)
BUN: 15 mg/dL (ref 7.0–26.0)
CALCIUM: 5.8 mg/dL — AB (ref 8.4–10.4)
CO2: 30 mEq/L — ABNORMAL HIGH (ref 22–29)
CREATININE: 0.8 mg/dL (ref 0.6–1.1)
Chloride: 98 mEq/L (ref 98–109)
EGFR: 69 mL/min/{1.73_m2} — ABNORMAL LOW (ref >90)
GLUCOSE: 109 mg/dL (ref 70–140)
Potassium: 3.5 mEq/L (ref 3.5–5.1)
Sodium: 138 mEq/L (ref 136–145)
Total Bilirubin: 0.97 mg/dL (ref 0.20–1.20)
Total Protein: 6 g/dL — ABNORMAL LOW (ref 6.4–8.3)

## 2015-01-09 LAB — CBC WITH DIFFERENTIAL/PLATELET
BASO%: 0.5 % (ref 0.0–2.0)
Basophils Absolute: 0 10*3/uL (ref 0.0–0.1)
EOS%: 0.4 % (ref 0.0–7.0)
Eosinophils Absolute: 0 10*3/uL (ref 0.0–0.5)
HEMATOCRIT: 23.2 % — AB (ref 34.8–46.6)
HEMOGLOBIN: 7.8 g/dL — AB (ref 11.6–15.9)
LYMPH#: 0.5 10*3/uL — AB (ref 0.9–3.3)
LYMPH%: 19.7 % (ref 14.0–49.7)
MCH: 29.2 pg (ref 25.1–34.0)
MCHC: 33.5 g/dL (ref 31.5–36.0)
MCV: 87.3 fL (ref 79.5–101.0)
MONO#: 0.3 10*3/uL (ref 0.1–0.9)
MONO%: 11.1 % (ref 0.0–14.0)
NEUT%: 68.3 % (ref 38.4–76.8)
NEUTROS ABS: 1.9 10*3/uL (ref 1.5–6.5)
Platelets: 60 10*3/uL — ABNORMAL LOW (ref 145–400)
RBC: 2.66 10*6/uL — ABNORMAL LOW (ref 3.70–5.45)
RDW: 16.4 % — AB (ref 11.2–14.5)
WBC: 2.8 10*3/uL — AB (ref 3.9–10.3)

## 2015-01-09 LAB — MAGNESIUM (CC13): MAGNESIUM: 1.2 mg/dL — AB (ref 1.5–2.5)

## 2015-01-09 MED ORDER — SODIUM CHLORIDE 0.9 % IJ SOLN
10.0000 mL | INTRAMUSCULAR | Status: DC | PRN
  Administered 2015-01-09: 10 mL via INTRAVENOUS
  Filled 2015-01-09: qty 10

## 2015-01-09 NOTE — Progress Notes (Signed)
Patient entering [facility]. No clinical visit today.  Carlton Adam, PA-C 01/09/2015

## 2015-01-09 NOTE — Progress Notes (Signed)
Referral to [facility] made. Family will be contacted within 24hrs.

## 2015-01-09 NOTE — Patient Instructions (Signed)

## 2015-01-09 NOTE — Progress Notes (Signed)
3-D simulation note: The patient completed 3-D simulation for treatment to her lower thoracic spine.  She was set up AP and PA from T8-L1.  2 unique MLCs and also one wedge were utilized (2 complex treatment devices) to conform her field.  Dose volume histograms were obtained for the heart, esophagus, spinal cord, and kidneys.  We met our departmental guidelines.  I am prescribing 2500 cGy in 10 sessions utilizing 15 MV photons posteriorly and 10 MV photons anteriorly.

## 2015-01-10 ENCOUNTER — Other Ambulatory Visit: Payer: Medicare Other

## 2015-01-10 ENCOUNTER — Ambulatory Visit: Payer: Medicare Other

## 2015-01-10 ENCOUNTER — Ambulatory Visit
Admission: RE | Admit: 2015-01-10 | Discharge: 2015-01-10 | Disposition: A | Discharge status: Home / Self Care | Payer: Medicare Other | Source: Ambulatory Visit | Attending: Radiation Oncology | Admitting: Radiation Oncology

## 2015-01-10 ENCOUNTER — Telehealth: Payer: Self-pay | Admitting: Medical Oncology

## 2015-01-10 DIAGNOSIS — M810 Age-related osteoporosis without current pathological fracture: Secondary | ICD-10-CM | POA: Diagnosis not present

## 2015-01-10 DIAGNOSIS — Z85118 Personal history of other malignant neoplasm of bronchus and lung: Secondary | ICD-10-CM | POA: Diagnosis not present

## 2015-01-10 DIAGNOSIS — A047 Enterocolitis due to Clostridium difficile: Secondary | ICD-10-CM | POA: Diagnosis not present

## 2015-01-10 DIAGNOSIS — F419 Anxiety disorder, unspecified: Secondary | ICD-10-CM | POA: Diagnosis not present

## 2015-01-10 DIAGNOSIS — I10 Essential (primary) hypertension: Secondary | ICD-10-CM | POA: Diagnosis not present

## 2015-01-10 DIAGNOSIS — E559 Vitamin D deficiency, unspecified: Secondary | ICD-10-CM | POA: Diagnosis not present

## 2015-01-10 DIAGNOSIS — Z510 Encounter for antineoplastic radiation therapy: Secondary | ICD-10-CM | POA: Diagnosis not present

## 2015-01-10 DIAGNOSIS — K589 Irritable bowel syndrome without diarrhea: Secondary | ICD-10-CM | POA: Diagnosis not present

## 2015-01-10 DIAGNOSIS — Z87891 Personal history of nicotine dependence: Secondary | ICD-10-CM | POA: Diagnosis not present

## 2015-01-10 DIAGNOSIS — E785 Hyperlipidemia, unspecified: Secondary | ICD-10-CM | POA: Diagnosis not present

## 2015-01-10 DIAGNOSIS — E213 Hyperparathyroidism, unspecified: Secondary | ICD-10-CM | POA: Diagnosis not present

## 2015-01-10 DIAGNOSIS — K449 Diaphragmatic hernia without obstruction or gangrene: Secondary | ICD-10-CM | POA: Diagnosis not present

## 2015-01-10 DIAGNOSIS — C801 Malignant (primary) neoplasm, unspecified: Secondary | ICD-10-CM | POA: Diagnosis present

## 2015-01-10 DIAGNOSIS — Z79899 Other long term (current) drug therapy: Secondary | ICD-10-CM | POA: Diagnosis not present

## 2015-01-10 DIAGNOSIS — K219 Gastro-esophageal reflux disease without esophagitis: Secondary | ICD-10-CM | POA: Diagnosis not present

## 2015-01-10 DIAGNOSIS — C7951 Secondary malignant neoplasm of bone: Secondary | ICD-10-CM | POA: Diagnosis present

## 2015-01-10 DIAGNOSIS — M4800 Spinal stenosis, site unspecified: Secondary | ICD-10-CM | POA: Diagnosis not present

## 2015-01-10 DIAGNOSIS — C7800 Secondary malignant neoplasm of unspecified lung: Secondary | ICD-10-CM | POA: Diagnosis present

## 2015-01-10 NOTE — Telephone Encounter (Signed)
[name] told Pam that he cancelled al further xgeva.

## 2015-01-11 ENCOUNTER — Ambulatory Visit
Admission: RE | Admit: 2015-01-11 | Discharge: 2015-01-11 | Disposition: A | Discharge status: Home / Self Care | Payer: Medicare Other | Source: Ambulatory Visit | Attending: Radiation Oncology | Admitting: Radiation Oncology

## 2015-01-11 DIAGNOSIS — C801 Malignant (primary) neoplasm, unspecified: Secondary | ICD-10-CM | POA: Diagnosis not present

## 2015-01-12 ENCOUNTER — Ambulatory Visit
Admission: RE | Admit: 2015-01-12 | Discharge: 2015-01-12 | Disposition: A | Discharge status: Home / Self Care | Payer: Medicare Other | Source: Ambulatory Visit | Attending: Radiation Oncology | Admitting: Radiation Oncology

## 2015-01-12 DIAGNOSIS — C801 Malignant (primary) neoplasm, unspecified: Secondary | ICD-10-CM | POA: Diagnosis not present

## 2015-01-15 ENCOUNTER — Ambulatory Visit
Admission: RE | Admit: 2015-01-15 | Discharge: 2015-01-15 | Disposition: A | Discharge status: Home / Self Care | Payer: Medicare Other | Source: Ambulatory Visit | Attending: Radiation Oncology | Admitting: Radiation Oncology

## 2015-01-15 ENCOUNTER — Encounter: Payer: Self-pay | Admitting: Radiation Oncology

## 2015-01-15 VITALS — BP 115/62 | HR 98 | Temp 98.8°F | Resp 20 | Ht 65.0 in | Wt 107.2 lb

## 2015-01-15 DIAGNOSIS — C7951 Secondary malignant neoplasm of bone: Secondary | ICD-10-CM

## 2015-01-15 DIAGNOSIS — C801 Malignant (primary) neoplasm, unspecified: Secondary | ICD-10-CM | POA: Diagnosis not present

## 2015-01-15 NOTE — Progress Notes (Signed)
[name] has completed 4 fractions to her t-l spine.  She reports pain at a 5/10 in her right lower back.  She is not currently taking anything for pain but has percocet that she can take as needed.  She denies having any more nausea than usual.  She denies having a sore throat or trouble swallowing.  BP 115/62 mmHg  Pulse 98  Temp(Src) 98.8 F (37.1 C) (Oral)  Resp 20  Ht '5\' 5"'$  (1.651 m)  Wt 107 lb 3.2 oz (48.626 kg)  BMI 17.84 kg/m2  SpO2 100%

## 2015-01-15 NOTE — Progress Notes (Signed)
Weekly Management Note:  Site: low thoracic/lumbar spine Current Dose:   1000  cGy Projected Dose:  2500  cGy  Narrative: The patient is seen today for routine under treatment assessment. CBCT/MVCT images/port films were reviewed. The chart was reviewed.    She is without new complaints today.  She is not taking anything for pain right now but does have  Percocet to take as needed.  Her pain is 5/10.  She and Dr. Julien Nordmann have decided on no more chemotherapy with referral to hospice.  Physical Examination:  Filed Vitals:   01/15/15 1746  BP: 115/62  Pulse: 98  Temp: 98.8 F (37.1 C)  Resp: 20  .  Weight: 107 lb 3.2 oz (48.626 kg).  No change.  No skin changes.  Impression: Tolerating radiation therapy well.  She has 6 more fractions of radiation therapy and this will be completed on Wednesday September 7.  Plan: Continue radiation therapy as planned.

## 2015-01-16 ENCOUNTER — Ambulatory Visit
Admission: RE | Admit: 2015-01-16 | Discharge: 2015-01-16 | Disposition: A | Discharge status: Home / Self Care | Payer: Medicare Other | Source: Ambulatory Visit | Attending: Radiation Oncology | Admitting: Radiation Oncology

## 2015-01-16 DIAGNOSIS — C801 Malignant (primary) neoplasm, unspecified: Secondary | ICD-10-CM | POA: Diagnosis not present

## 2015-01-17 ENCOUNTER — Ambulatory Visit
Admission: RE | Admit: 2015-01-17 | Discharge: 2015-01-17 | Disposition: A | Discharge status: Home / Self Care | Payer: Medicare Other | Source: Ambulatory Visit | Attending: Radiation Oncology | Admitting: Radiation Oncology

## 2015-01-17 DIAGNOSIS — C801 Malignant (primary) neoplasm, unspecified: Secondary | ICD-10-CM | POA: Diagnosis not present

## 2015-01-18 ENCOUNTER — Ambulatory Visit
Admission: RE | Admit: 2015-01-18 | Discharge: 2015-01-18 | Disposition: A | Discharge status: Home / Self Care | Payer: Medicare Other | Source: Ambulatory Visit | Attending: Radiation Oncology | Admitting: Radiation Oncology

## 2015-01-18 DIAGNOSIS — C801 Malignant (primary) neoplasm, unspecified: Secondary | ICD-10-CM | POA: Diagnosis not present

## 2015-01-19 ENCOUNTER — Ambulatory Visit
Admission: RE | Admit: 2015-01-19 | Discharge: 2015-01-19 | Disposition: A | Discharge status: Home / Self Care | Payer: Medicare Other | Source: Ambulatory Visit | Attending: Radiation Oncology | Admitting: Radiation Oncology

## 2015-01-19 ENCOUNTER — Ambulatory Visit: Payer: Medicare Other

## 2015-01-19 DIAGNOSIS — C801 Malignant (primary) neoplasm, unspecified: Secondary | ICD-10-CM | POA: Diagnosis not present

## 2015-01-23 ENCOUNTER — Ambulatory Visit: Payer: Medicare Other

## 2015-01-23 ENCOUNTER — Ambulatory Visit
Admission: RE | Admit: 2015-01-23 | Discharge: 2015-01-23 | Disposition: A | Discharge status: Home / Self Care | Payer: Medicare Other | Source: Ambulatory Visit | Attending: Radiation Oncology | Admitting: Radiation Oncology

## 2015-01-23 ENCOUNTER — Ambulatory Visit: Admission: RE | Admit: 2015-01-23 | Payer: Medicare Other | Source: Ambulatory Visit | Admitting: Radiation Oncology

## 2015-01-24 ENCOUNTER — Ambulatory Visit: Payer: Medicare Other

## 2015-01-25 ENCOUNTER — Ambulatory Visit: Payer: Medicare Other

## 2015-01-26 ENCOUNTER — Ambulatory Visit: Payer: Medicare Other

## 2015-01-29 ENCOUNTER — Ambulatory Visit
Admission: RE | Admit: 2015-01-29 | Discharge: 2015-01-29 | Disposition: A | Discharge status: Home / Self Care | Payer: Medicare Other | Source: Ambulatory Visit | Attending: Radiation Oncology | Admitting: Radiation Oncology

## 2015-01-29 ENCOUNTER — Encounter: Payer: Self-pay | Admitting: Radiation Oncology

## 2015-01-29 ENCOUNTER — Ambulatory Visit: Payer: Medicare Other

## 2015-01-29 VITALS — BP 101/68 | HR 97 | Temp 98.1°F | Resp 16 | Wt 101.8 lb

## 2015-01-29 DIAGNOSIS — C801 Malignant (primary) neoplasm, unspecified: Secondary | ICD-10-CM | POA: Diagnosis not present

## 2015-01-29 DIAGNOSIS — C7951 Secondary malignant neoplasm of bone: Secondary | ICD-10-CM

## 2015-01-29 NOTE — Progress Notes (Signed)
weekly rad txs T-L-spine 9/10 compelte, mild pain lower right sarum area,stated,it's better than before, ", poor appetite, fatiued, weak 3:16 PM BP 101/68 mmHg  Pulse 97  Temp(Src) 98.1 F (36.7 C) (Oral)  Resp 16  Wt 101 lb 12.8 oz (46.176 kg)  SpO2 100%  Wt Readings from Last 3 Encounters:  01/29/15 101 lb 12.8 oz (46.176 kg)  01/15/15 107 lb 3.2 oz (48.626 kg)  01/03/15 105 lb 1.6 oz (47.673 kg)

## 2015-01-29 NOTE — Progress Notes (Signed)
Weekly Management Note:  Site: TL spine Current Dose:  2250  cGy Projected Dose: 2500  cGy  Narrative: The patient is seen today for routine under treatment assessment. CBCT/MVCT images/port films were reviewed. The chart was reviewed.   She is doing better and she states that her pain is improved, now 4/10, compared to prior treatment 7/10.  She finishes her treatment tomorrow.  She remains under Hospice care.  She does have oxycodone to take when necessary but she does not take premedication every day.  Physical Examination:  Filed Vitals:   01/29/15 1513  BP: 101/68  Pulse: 97  Temp: 98.1 F (36.7 C)  Resp: 16  .  Weight: 101 lb 12.8 oz (46.176 kg).  There are no significant skin changes.  Impression: Tolerating radiation therapy well.  She will finish her radiation therapy tomorrow.  Plan: Continue radiation therapy as planned.  One-month follow-up visit after completion of radiation therapy.

## 2015-01-30 ENCOUNTER — Encounter: Payer: Self-pay | Admitting: Radiation Oncology

## 2015-01-30 ENCOUNTER — Ambulatory Visit
Admission: RE | Admit: 2015-01-30 | Discharge: 2015-01-30 | Disposition: A | Discharge status: Home / Self Care | Payer: Medicare Other | Source: Ambulatory Visit | Attending: Radiation Oncology | Admitting: Radiation Oncology

## 2015-01-30 ENCOUNTER — Ambulatory Visit: Payer: Medicare Other

## 2015-01-30 DIAGNOSIS — C801 Malignant (primary) neoplasm, unspecified: Secondary | ICD-10-CM | POA: Diagnosis not present

## 2015-01-30 NOTE — Progress Notes (Signed)
[facility] End of Treatment Note  Name:[name]  Date: 01/30/2015 JQD:[id number] DOB:[birth year]   Status:outpatient    CC: [name], MD  Dr. Curt Bears  REFERRING PHYSICIAN:   Dr. 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  I expect her to have continued improvement of her pain.  She is taking less oxycodone, and does not use oxycodone every day.                          PLAN: Routine followup in one month. Patient instructed to call if questions or worsening complaints in interim.

## 2015-02-28 ENCOUNTER — Ambulatory Visit: Payer: Medicare Other | Admitting: Radiation Oncology

## 2015-03-01 ENCOUNTER — Encounter: Payer: Self-pay | Admitting: Radiation Oncology

## 2015-03-01 ENCOUNTER — Ambulatory Visit
Admission: RE | Admit: 2015-03-01 | Discharge: 2015-03-01 | Disposition: A | Discharge status: Home / Self Care | Payer: Medicare Other | Source: Ambulatory Visit | Attending: Radiation Oncology | Admitting: Radiation Oncology

## 2015-03-01 VITALS — BP 145/71 | HR 102 | Temp 98.5°F | Ht 65.0 in | Wt 113.8 lb

## 2015-03-01 DIAGNOSIS — C7951 Secondary malignant neoplasm of bone: Secondary | ICD-10-CM

## 2015-03-01 HISTORY — DX: Personal history of irradiation: Z92.3

## 2015-03-01 NOTE — Progress Notes (Signed)
[name] returns s/p XRT to her Lower Thoracic / and upper lumbar spine which completed on 01/30/15.  She reports level 5/10 aching in her right lateral back region and lumbar area.  She has gained 12 lbs since 01/29/15.

## 2015-03-01 NOTE — Progress Notes (Signed)
CC: Dr. Curt Bears, Dr. Murvin Donning  Follow-up note: Ms. rothbauer returns today approximately 1 month following completion of palliative radiotherapy to her lung/mediastinum and lower thoracic/lumbar spine.  She states that her pain is improved although she does take 1-2 oxycodone/APAP (5/325) tabs late in the afternoon.  Her appetite is improved and her weight is up 12 pounds over the past month.  Her spirits are much improved.  She denies dysphagia, or dyspnea.  Her discomfort is localized to the right mid to lower back.  She is now under hospice care.  She has not had any chemotherapy in over a month.  Physical examination: Alert and oriented.  She really looks good today and is wearing makeup. Filed Vitals:   03/01/15 1255  BP: 145/71  Pulse: 102  Temp: 98.5 F (36.9 C)   Nodes: There is no palpable cervical or supraclavicular lymphadenopathy.  Chest: Lungs are clear.  Back: There is no palpable spinal discomfort but she describes discomfort along the right paraspinal region centered at approximately L1/L2.  Impression: Satisfactory palliation.  Plan: Continue comfort care through hospice.  We can see her as needed.

## 2015-04-25 ENCOUNTER — Encounter: Payer: Self-pay | Admitting: Medical Oncology

## 2015-04-25 NOTE — Progress Notes (Signed)
Pt died 2015-05-24

## 2015-05-15 ENCOUNTER — Other Ambulatory Visit: Payer: Self-pay | Admitting: Nurse Practitioner

## 2015-05-20 DEATH — deceased

## 2015-07-09 ENCOUNTER — Ambulatory Visit: Payer: Medicare Other | Admitting: Neurology

## 2015-08-26 IMAGING — CT CT CHEST W/ CM
2 of 6 series · 14 of 46 positions shown, 16 images · IV contrast (OMNIPAQUE)
Comparison: 10/13/2014.

CLINICAL DATA: Subsequent encounter for small-cell lung cancer.

EXAM:
CT CHEST, ABDOMEN, AND PELVIS WITH CONTRAST
TECHNIQUE: Multidetector CT imaging of the chest, abdomen and pelvis was
performed following the standard protocol during bolus
administration of intravenous contrast.
CONTRAST:  100mL OMNIPAQUE IOHEXOL 300 MG/ML  SOLN

[Series 2: cap with st · axial · 0.69mm/px · z∈[-560,-45]mm · 11 of 117 slices shown, 13 images]
[im 7/117  soft-tissue]
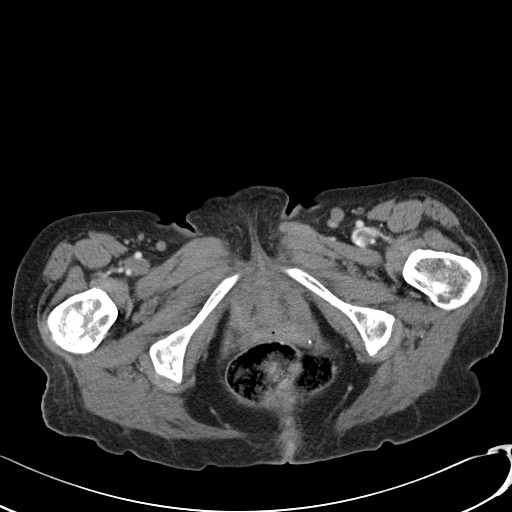
[im 7/117  bone]
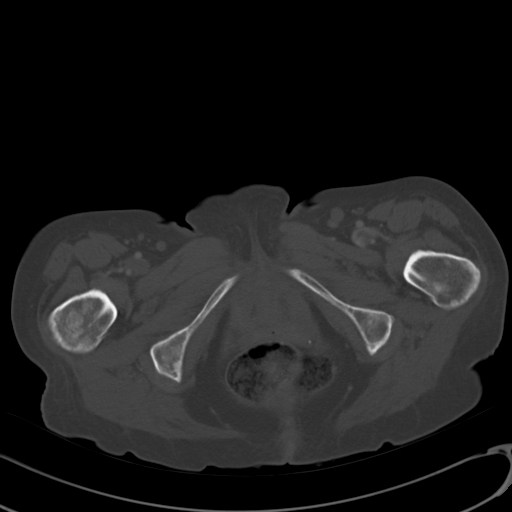
[im 21/117  soft-tissue]
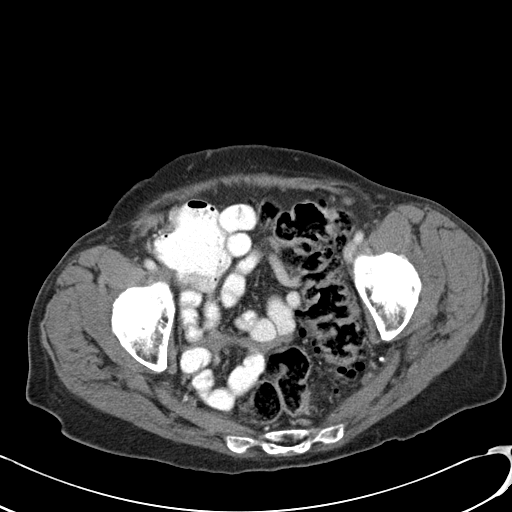
[im 28/117  soft-tissue]
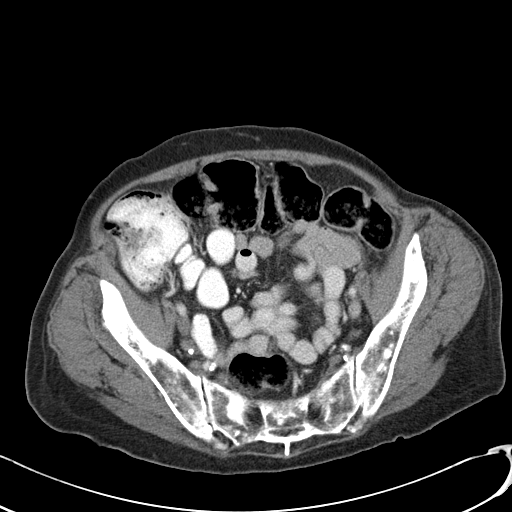
[im 41/117  soft-tissue]
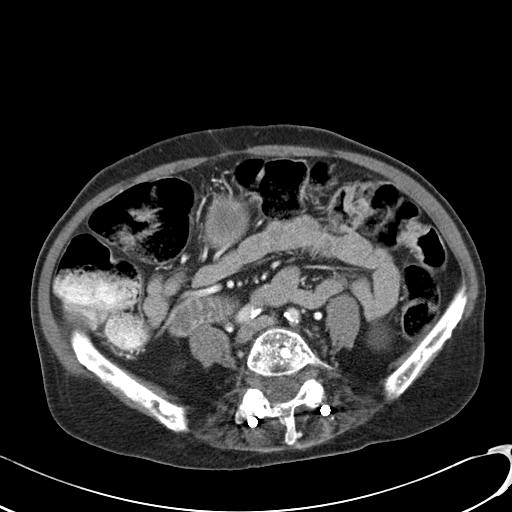
[im 48/117  soft-tissue]
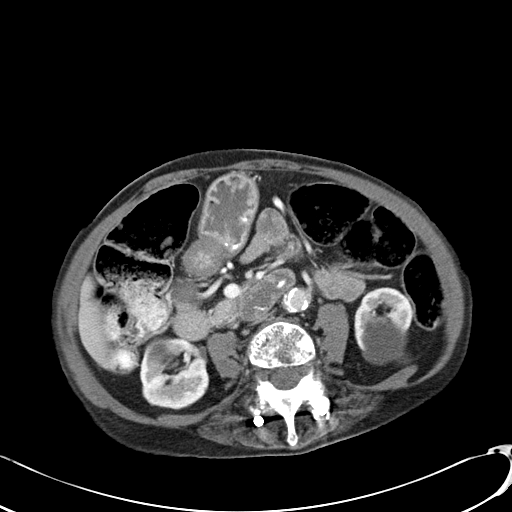
[im 62/117  soft-tissue]
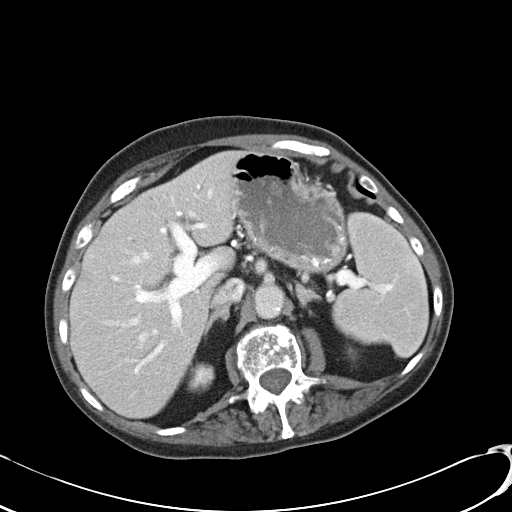
[im 69/117  soft-tissue]
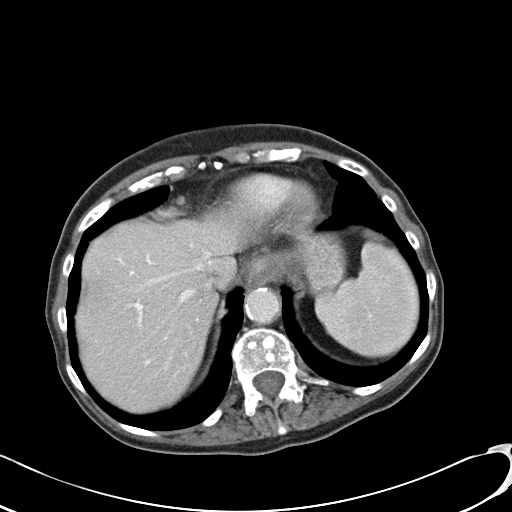
[im 76/117  soft-tissue]
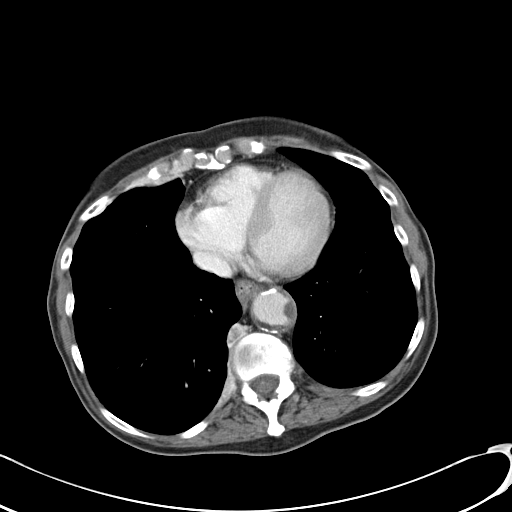
[im 89/117  soft-tissue]
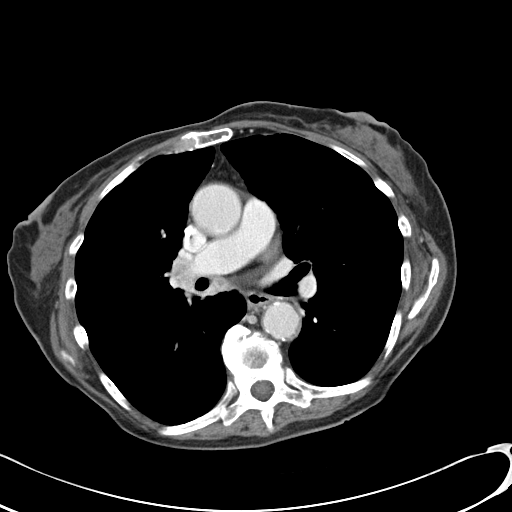
[im 89/117  bone]
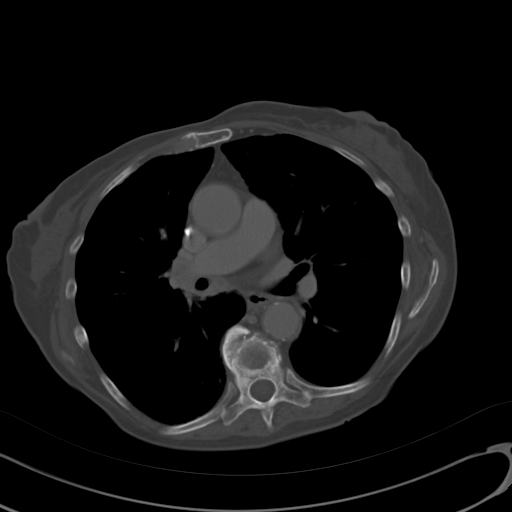
[im 96/117  soft-tissue]
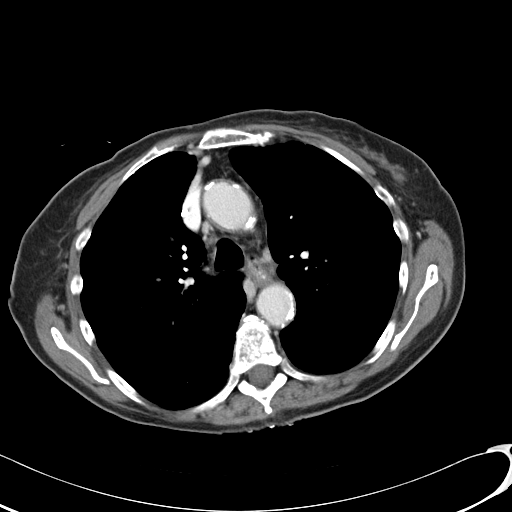
[im 110/117  soft-tissue]
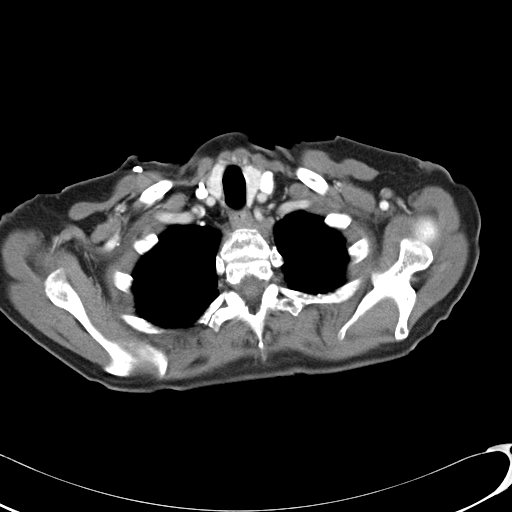

[Series 602: <mpr thick range> · coronal · 1.14mm/px · 3 of 93 slices shown]
[im 31/93  soft-tissue]
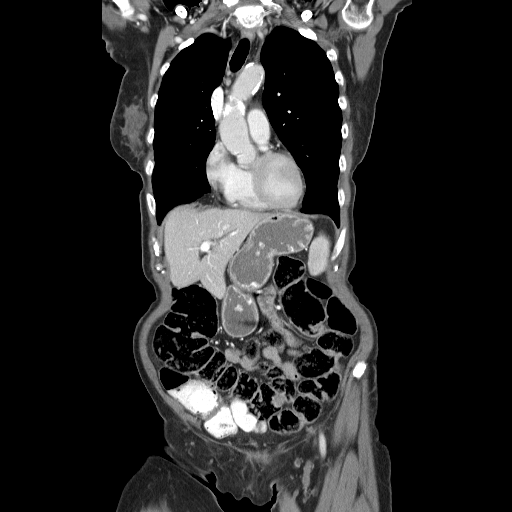
[im 41/93  soft-tissue]
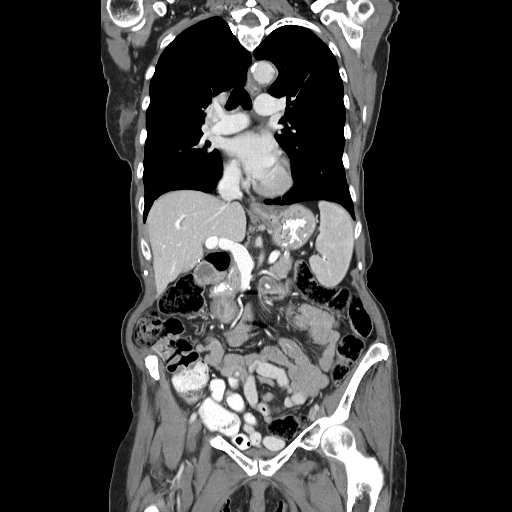
[im 52/93  soft-tissue]
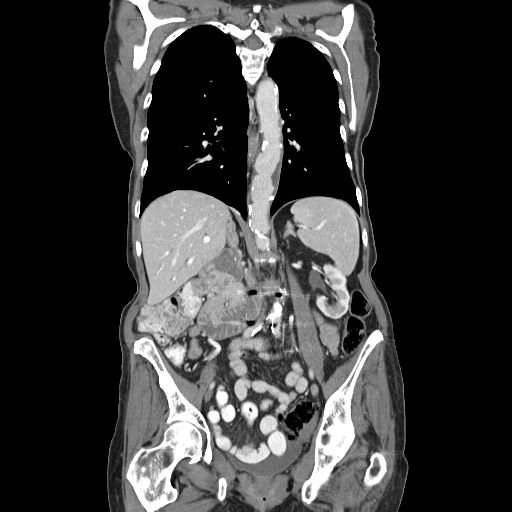

[14 of 46 positions shown; findings below may reference images not displayed]

FINDINGS: CT CHEST FINDINGS

Mediastinum/Nodes: Right-sided Port-A-Cath tip is positioned at the
junction of the SVC and RA. There is no axillary lymphadenopathy. 15
mm precarinal lymph node measured on the previous study is now 13 mm
in short axis. 21 mm right hilar lymph node measured previously is
canal 18 mm in the same dimension and measures 12 mm in short axis.
16 mm short axis subcarinal lymph node measured on the previous
study is now 10 mm in short axis. No left hilar lymphadenopathy.
Heart size is normal. No pericardial effusion.

The 11 x 18 mm paraspinal nodule at the T11 level now measures 16 x
11 mm. A more ill-defined right paraspinal plaque-like lesion (image
42 series 2) measures 8 x 30 mm today compared to 12 x 31 mm
previously.

Lungs/Pleura: Emphysematous changes noted in the lungs bilaterally.
Tree in bud nodularity seen in the posterior right upper lobe
previously is again noted but appears slightly decreased in the
interval. The dominant 7 mm nodule seen in this region on the prior
study is now 5 mm. 3 mm nodule along the anterior aspect of the
right major fissure was 4-5 mm previously. Previously described
subpleural nodule within the right lower lobe (image 31 series 4
today) is not substantially changed in the interval measuring 6 x 19
mm compared to 6 x 20 mm previously. This is in the region of the
prominent bony spur and pathologic vertebral collapse. Other
scattered smaller pleural nodules are seen along the right major
fissure. No suspicious nodule or mass in the left lung. There is no
pulmonary edema pleural effusion. No focal airspace consolidation.

Musculoskeletal: Multiple sclerotic lesions are seen throughout the
thoracic spine. Compression deformity of the T9 vertebral body is
stable and likely pathologic in nature.

CT ABDOMEN AND PELVIS FINDINGS

Hepatobiliary: Stable mild prominence of the intrahepatic biliary
ducts. Tiny 6 mm hypo attenuating lesion in the posterior right
liver (image 52 series 2) is stable in the interval. No other focal
liver lesion is evident. Gallbladder is surgically absent. No
dilatation of the extrahepatic bile ducts.

Pancreas: No focal mass lesion. No dilatation of the main duct. No
intraparenchymal cyst. No peripancreatic edema.

Spleen: No splenomegaly. No focal mass lesion.

Adrenals/Urinary Tract: Stable 11 mm right adrenal nodule. No
discrete left adrenal nodule. Areas of cortical scarring are seen in
the kidneys bilaterally. 2.6 cm cyst identified at the lower pole
the right kidney. 3.7 cm cyst identified in the lower pole of the
left kidney. Other smaller cysts are noted in the kidneys
bilaterally.

Stomach/Bowel: Stomach is nondistended. No gastric wall thickening.
No evidence of outlet obstruction. A very large duodenum
diverticulum is evident. No small bowel wall thickening. No small
bowel dilatation. Terminal ileum is normal. The appendix is normal.
Prominent diffuse stool volume raises the question of, but is not
diagnostic for constipation. There is left colonic diverticulosis
without diverticulitis.

Vascular/Lymphatic: There is abdominal aortic atherosclerosis
without aneurysm. Infrarenal abdominal aorta measures up to 2.9 cm
in diameter which is upper normal. No gastrohepatic or
hepatoduodenal ligament lymphadenopathy. The portal vein is patent.
No retroperitoneal lymphadenopathy. No pelvic sidewall
lymphadenopathy.

Reproductive: Uterus is surgically absent.  No adnexal mass.

Other: No intraperitoneal free fluid.

Musculoskeletal: Numerous sclerotic lesions are seen involving bony
anatomy of the pelvis. These appear relatively stable in the
interval with no definite new lesion or progressive lesion evident.
IMPRESSION: 1. Interval decrease in mediastinal lymphadenopathy. Small
pleural-based and paraspinal enhancing nodules are also decreased.
2. Multiple pulmonary nodules again identified, as before. The show
slight interval decrease in size. No new or progressive pulmonary
nodule is evident.
3. Stable 11 mm right adrenal nodule.
4. No new or progressive disease in the abdomen or pelvis to suggest
metastatic progression.
5. Numerous sclerotic bony metastases with stable compression
deformity of the T9 vertebral body, likely pathologic. Bony
metastatic involvement does not appear progressed in the interval.
